# Patient Record
Sex: Female | Born: 1960 | Race: White | Hispanic: No | Marital: Married | Smoking: Former smoker
Health system: Southern US, Community
[De-identification: ages and names within clinical notes are randomized; demographics above are authoritative.]

## PROBLEM LIST (undated history)

## (undated) ENCOUNTER — Ambulatory Visit: Disposition: A | Discharge status: Still In Hospital

## (undated) DIAGNOSIS — F419 Anxiety disorder, unspecified: Secondary | ICD-10-CM

## (undated) DIAGNOSIS — R112 Nausea with vomiting, unspecified: Secondary | ICD-10-CM

## (undated) DIAGNOSIS — Z923 Personal history of irradiation: Secondary | ICD-10-CM

## (undated) DIAGNOSIS — B1920 Unspecified viral hepatitis C without hepatic coma: Secondary | ICD-10-CM

## (undated) DIAGNOSIS — Z8042 Family history of malignant neoplasm of prostate: Secondary | ICD-10-CM

## (undated) DIAGNOSIS — R9389 Abnormal findings on diagnostic imaging of other specified body structures: Secondary | ICD-10-CM

## (undated) DIAGNOSIS — C801 Malignant (primary) neoplasm, unspecified: Secondary | ICD-10-CM

## (undated) DIAGNOSIS — Z800 Family history of malignant neoplasm of digestive organs: Secondary | ICD-10-CM

## (undated) DIAGNOSIS — I2510 Atherosclerotic heart disease of native coronary artery without angina pectoris: Secondary | ICD-10-CM

## (undated) DIAGNOSIS — M542 Cervicalgia: Secondary | ICD-10-CM

## (undated) DIAGNOSIS — M545 Low back pain, unspecified: Secondary | ICD-10-CM

## (undated) DIAGNOSIS — E079 Disorder of thyroid, unspecified: Secondary | ICD-10-CM

## (undated) DIAGNOSIS — G629 Polyneuropathy, unspecified: Secondary | ICD-10-CM

## (undated) DIAGNOSIS — Z9889 Other specified postprocedural states: Secondary | ICD-10-CM

## (undated) DIAGNOSIS — Z853 Personal history of malignant neoplasm of breast: Secondary | ICD-10-CM

## (undated) DIAGNOSIS — L308 Other specified dermatitis: Secondary | ICD-10-CM

## (undated) DIAGNOSIS — E0590 Thyrotoxicosis, unspecified without thyrotoxic crisis or storm: Secondary | ICD-10-CM

## (undated) DIAGNOSIS — K219 Gastro-esophageal reflux disease without esophagitis: Secondary | ICD-10-CM

## (undated) DIAGNOSIS — Z803 Family history of malignant neoplasm of breast: Secondary | ICD-10-CM

## (undated) HISTORY — DX: Disorder of thyroid, unspecified: E07.9

## (undated) HISTORY — DX: Unspecified viral hepatitis C without hepatic coma: B19.20

## (undated) HISTORY — PX: TONSILECTOMY/ADENOIDECTOMY WITH MYRINGOTOMY: SHX6125

## (undated) HISTORY — DX: Family history of malignant neoplasm of breast: Z80.3

## (undated) HISTORY — DX: Gastro-esophageal reflux disease without esophagitis: K21.9

## (undated) HISTORY — DX: Abnormal findings on diagnostic imaging of other specified body structures: R93.89

## (undated) HISTORY — PX: BREAST LUMPECTOMY: SHX2

## (undated) HISTORY — DX: Family history of malignant neoplasm of digestive organs: Z80.0

## (undated) HISTORY — DX: Family history of malignant neoplasm of prostate: Z80.42

---

## 1993-04-19 HISTORY — PX: OTHER SURGICAL HISTORY: SHX169

## 1998-02-11 ENCOUNTER — Ambulatory Visit (HOSPITAL_COMMUNITY): Admission: RE | Admit: 1998-02-11 | Discharge: 1998-02-11 | Payer: Self-pay | Admitting: Gastroenterology

## 1999-04-20 DIAGNOSIS — B1920 Unspecified viral hepatitis C without hepatic coma: Secondary | ICD-10-CM

## 1999-04-20 HISTORY — DX: Unspecified viral hepatitis C without hepatic coma: B19.20

## 2007-04-20 HISTORY — PX: ABDOMINAL HYSTERECTOMY: SHX81

## 2007-09-26 ENCOUNTER — Ambulatory Visit: Payer: Self-pay | Admitting: Gastroenterology

## 2007-12-04 ENCOUNTER — Ambulatory Visit: Payer: Self-pay | Admitting: Unknown Physician Specialty

## 2007-12-14 ENCOUNTER — Ambulatory Visit: Payer: Self-pay | Admitting: Unknown Physician Specialty

## 2008-10-09 ENCOUNTER — Encounter: Payer: Self-pay | Admitting: Internal Medicine

## 2008-10-19 ENCOUNTER — Encounter: Payer: Self-pay | Admitting: Internal Medicine

## 2008-10-29 ENCOUNTER — Encounter: Payer: Self-pay | Admitting: Internal Medicine

## 2008-11-21 ENCOUNTER — Ambulatory Visit: Payer: Self-pay | Admitting: Gastroenterology

## 2009-01-08 ENCOUNTER — Ambulatory Visit: Payer: Self-pay | Admitting: Internal Medicine

## 2009-01-08 DIAGNOSIS — B1710 Acute hepatitis C without hepatic coma: Secondary | ICD-10-CM | POA: Insufficient documentation

## 2009-01-08 DIAGNOSIS — R0602 Shortness of breath: Secondary | ICD-10-CM | POA: Insufficient documentation

## 2009-01-09 ENCOUNTER — Encounter: Payer: Self-pay | Admitting: Internal Medicine

## 2009-01-14 ENCOUNTER — Telehealth: Payer: Self-pay | Admitting: Internal Medicine

## 2009-01-23 ENCOUNTER — Telehealth (INDEPENDENT_AMBULATORY_CARE_PROVIDER_SITE_OTHER): Payer: Self-pay

## 2009-01-25 ENCOUNTER — Ambulatory Visit: Payer: Self-pay | Admitting: Family Medicine

## 2009-01-27 ENCOUNTER — Ambulatory Visit: Payer: Self-pay | Admitting: Internal Medicine

## 2009-01-27 ENCOUNTER — Ambulatory Visit (HOSPITAL_COMMUNITY): Admission: RE | Admit: 2009-01-27 | Discharge: 2009-01-27 | Payer: Self-pay | Admitting: Internal Medicine

## 2009-01-27 ENCOUNTER — Encounter (HOSPITAL_COMMUNITY): Admission: RE | Admit: 2009-01-27 | Discharge: 2009-03-21 | Payer: Self-pay | Admitting: Internal Medicine

## 2009-01-27 ENCOUNTER — Encounter: Payer: Self-pay | Admitting: Internal Medicine

## 2009-01-27 ENCOUNTER — Ambulatory Visit: Payer: Self-pay

## 2009-04-19 HISTORY — PX: COLONOSCOPY: SHX174

## 2009-06-05 ENCOUNTER — Encounter: Admission: RE | Admit: 2009-06-05 | Discharge: 2009-06-05 | Payer: Self-pay | Admitting: General Surgery

## 2009-12-24 ENCOUNTER — Encounter: Admission: RE | Admit: 2009-12-24 | Discharge: 2009-12-24 | Payer: Self-pay | Admitting: Unknown Physician Specialty

## 2009-12-30 ENCOUNTER — Encounter: Admission: RE | Admit: 2009-12-30 | Discharge: 2009-12-30 | Payer: Self-pay | Admitting: Obstetrics and Gynecology

## 2010-01-12 ENCOUNTER — Encounter: Admission: RE | Admit: 2010-01-12 | Discharge: 2010-01-12 | Payer: Self-pay | Admitting: Unknown Physician Specialty

## 2010-01-28 ENCOUNTER — Telehealth (INDEPENDENT_AMBULATORY_CARE_PROVIDER_SITE_OTHER): Payer: Self-pay | Admitting: *Deleted

## 2010-02-03 ENCOUNTER — Ambulatory Visit (HOSPITAL_BASED_OUTPATIENT_CLINIC_OR_DEPARTMENT_OTHER): Admission: RE | Admit: 2010-02-03 | Discharge: 2010-02-03 | Payer: Self-pay | Admitting: General Surgery

## 2010-02-03 ENCOUNTER — Encounter: Admission: RE | Admit: 2010-02-03 | Discharge: 2010-02-03 | Payer: Self-pay | Admitting: General Surgery

## 2010-04-19 HISTORY — PX: HEMORRHOID BANDING: SHX5850

## 2010-05-21 NOTE — Progress Notes (Signed)
Summary: RETURNING CALL  Phone Note Call from Patient Call back at [phone]   Caller: SELF Call For: BENSIMHON Summary of Call: RETURNING A CALL TO MELISSA-PLEASE CALL #[phone] Initial call taken by: Harlon Flor,  January 14, 2009 10:04 AM  Follow-up for Phone Call        results of CXR given. Follow-up by: Charlena Cross, RN, BSN,  January 14, 2009 12:39 PM

## 2010-05-21 NOTE — Assessment & Plan Note (Signed)
Summary: NEW PT; INCREASED SOB WITH EXERTION   Visit Type:  New Patient Primary Provider:  Einar Crow, MD  CC:  sob with exertion.  History of Present Illness: 50 y/o woman with hepatitis C but no h/o HTN, HL, or known CAD. +FHX CAD. Presents as new patient for evaluation of progressive dyspnea.  Had stress echo over 5 years ago for palpitations. Work-up normal. Has never had cath.  Over past few week has noticed that she is more dyspneic on activity. Tried to walk up 3 flights of steps and got very winded. No CP, wheezing or cough.  Continues to play softball but feel exercise tolerance is worse. Able to do ADLs without difficulty.  No longer plane or car trips recently. No orthopnea, PND or edema.   Preventive Screening-Counseling & Management  Alcohol-Tobacco     Alcohol drinks/day: 0     Smoking Status: quit     Year Quit: 2000     Pack years: 1 to 11/2 pack  Caffeine-Diet-Exercise     Caffeine use/day: 3 cups a day     Diet Comments: no     Does Patient Exercise: yes     Type of exercise: swimming      Drug Use:  no.    Problems Prior to Update: 1)  Shortness of Breath  (ICD-786.05) 2)  Hepatitis C  (ICD-070.51)  Current Medications (verified): 1)  Prilosec 20 Mg Cpdr (Omeprazole) .Marland Kitchen.. 1 By Mouth Once Daily  Allergies (verified): 1)  ! * Melaril  Past History:  Past Medical History: G E R D Hepatitis C   --followed br Dr. Esmond Harps at [facility]  Past Surgical History: Lumpectomy  Family History: Reviewed history and no changes required. mother: alive 36; hypertension; thyroid, multi problems father: alive 35; pacmaker,  afib, CAD, CHF 3 brother: healthy  Social History: Reviewed history and no changes required. Full Time Married  Tobacco Use - Former.  Alcohol Use - no Regular Exercise - yes Drug Use - no Alcohol drinks/day:  0 Smoking Status:  quit Pack years:  1 to 11/2 pack Caffeine use/day:  3 cups a day Diet Comments:  no Does  Patient Exercise:  yes Drug Use:  no  Review of Systems       As per HPI and past medical history; otherwise all systems negative.   Vital Signs:  Patient profile:   50 year old female Height:      67 inches Weight:      151.25 pounds BMI:     23.77 Pulse rate:   67 / minute Pulse rhythm:   regular BP sitting:   118 / 82  (left arm) Cuff size:   regular  Vitals Entered By: Mercer Pod (January 08, 2009 3:31 PM)  Physical Exam  General:  Gen: well appearing. no resp difficulty HEENT: normal Neck: supple. no JVD. Carotids 2+ bilat; no bruits. No lymphadenopathy or thryomegaly appreciated. Cor: PMI nondisplaced. Regular rate & rhythm. No rubs, gallops, murmur. Lungs: clear Abdomen: soft, nontender, nondistended. No hepatosplenomegaly. No bruits or masses. Good bowel sounds. Extremities: no cyanosis, clubbing, rash, edema Neuro: alert & orientedx3, cranial nerves grossly intact. moves all 4 extremities w/o difficulty. affect pleasant    Impression & Recommendations:  Problem # 1:  SHORTNESS OF BREATH (ICD-786.05) Unclear etiology. Will check CXR for baseline. Echo to make sure pulmonary pressures and LV function are normal. Check ETT/myoview to rule out ischemia though this is lower on my differential. If testing normal  also need to check CBC to make sure she is not anemic.  Other Orders: T-2 View CXR (71020TC) Nuclear Stress Test (Nuc Stress Test) Echocardiogram (Echo) EKG w/ Interpretation (93000)  Patient Instructions: 1)  A chest x-ray takes a picture of the organs and structures inside the chest, including the heart, lungs, and blood vessels. This test can show several things, including, whether the heart is enlarged; whether fluid is building up in the lungs; and whether pacemaker / defibrillator leads are still in place. 2)  Your physician has requested that you have an echocardiogram.  Echocardiography is a painless test that uses sound waves to create  images of your heart. It provides your doctor with information about the size and shape of your heart and how well your heart's chambers and valves are working.  This procedure takes approximately one hour. There are no restrictions for this procedure. 3)  Your physician has requested that you have an exercise stress myoview.  For further information please visit [email or web address]  Please follow instruction sheet, as given.

## 2010-05-21 NOTE — Assessment & Plan Note (Signed)
Summary: Cardiology Nuclear Study  Nuclear Med Background Indications for Stress Test: Evaluation for Ischemia   History: Echo  History Comments: 5 years ago Stress Echo: NL  Symptoms: DOE, Fatigue, Fatigue with Exertion, SOB    Nuclear Pre-Procedure Cardiac Risk Factors: History of Smoking Caffeine/Decaff Intake: None NPO After: 7:00 PM Lungs: clear IV 0.9% NS with Angio Cath: 22g     IV Site: (R) upper forearm IV Started by: Irean Hong RN Chest Size (in) 36     Cup Size B     Height (in): 67 Weight (lb): 147 BMI: 23.11 Tech Comments: Blunted BP, check pictures  Nuclear Med Study 1 or 2 day study:  1 day     Stress Test Type:  Stress Reading MD:  Dietrich Pates, MD     Referring MD:  D.Bensimhon Resting Radionuclide:  Technetium 72m Tetrofosmin     Resting Radionuclide Dose:  11 mCi  Stress Radionuclide:  Technetium 20m Tetrofosmin     Stress Radionuclide Dose:  32 mCi   Stress Protocol Exercise Time (min):  10:04 min     Max HR:  171 bpm     Predicted Max HR: 173 bpm  Max Systolic BP: 147 mm Hg     % Max HR:  98 %     METS: 11.8 Rate Pressure Product:  45409    Stress Test Technologist:  Milana Na EMT-P     Nuclear Technologist:  Domenic Polite CNMT  Rest Procedure  Myocardial perfusion imaging was performed at rest 45 minutes following the intravenous administration of Myoview Technetium 69m Tetrofosmin.  Stress Procedure  The patient exercised for 10:04. The patient stopped due to fatigue and denied any chest pain.  There were non specific ST-T wave changes.  Myoview was injected at peak exercise and myocardial perfusion imaging was performed after a brief delay.  QPS Raw Data Images:  Normal; no motion artifact; normal heart/lung ratio. Stress Images:  Normal perfusion. Rest Images:  Normal perfusion Subtraction (SDS):  No evidence of ischemia. Transient Ischemic Dilatation:  .95  (Normal <1.22)  Lung/Heart Ratio:  .36  (Normal <0.45)  Quantitative  Gated Spect Images QGS EDV:  71 ml QGS ESV:  18 ml QGS EF:  74 %   Overall Impression  Exercise Capacity: Excellent exercise capacity. BP Response: Normal blood pressure response. Clinical Symptoms: No chest pain ECG Impression: No significant ST segment change suggestive of ischemia. Overall Impression: Normal stress nuclear study.  Appended Document: Cardiology Nuclear Study looks good.  Appended Document: Cardiology Nuclear Study pt aware. Charlena Cross RN BSN

## 2010-05-21 NOTE — Progress Notes (Signed)
Summary: Records Request  Faxed Stress to [facility] at [facility] ([phone]). Debby Freiberg  January 28, 2010 9:23 AM

## 2010-05-21 NOTE — Progress Notes (Signed)
Summary: Nuc.Pre-Procedure  Phone Note Outgoing Call Call back at [facility] Phone [phone]   Call placed by: Irean Hong, RN,  January 23, 2009 11:24 AM Summary of Call: Left message with information on Myoview Information Sheet (see scanned document for details).      Nuclear Med Background Indications for Stress Test: Evaluation for Ischemia   History: Echo  History Comments: 5 years ago Stress Echo: NL  Symptoms: DOE, Fatigue, Fatigue with Exertion    Nuclear Pre-Procedure Cardiac Risk Factors: History of Smoking Height (in): 67

## 2010-05-21 NOTE — Progress Notes (Signed)
Summary: PHI  PHI   Imported By: Harlon Flor 01/10/2009 09:31:23  _____________________________________________________________________  External Attachment:    Type:   Image     Comment:   External Document

## 2010-07-01 LAB — COMPREHENSIVE METABOLIC PANEL
ALT: 40 U/L — ABNORMAL HIGH (ref 0–35)
AST: 29 U/L (ref 0–37)
Albumin: 3.7 g/dL (ref 3.5–5.2)
Alkaline Phosphatase: 47 U/L (ref 39–117)
BUN: 13 mg/dL (ref 6–23)
CO2: 25 mEq/L (ref 19–32)
Calcium: 9.1 mg/dL (ref 8.4–10.5)
Chloride: 111 mEq/L (ref 96–112)
Creatinine, Ser: 0.79 mg/dL (ref 0.4–1.2)
GFR calc Af Amer: >60 mL/min (ref >60)
GFR calc non Af Amer: >60 mL/min (ref >60)
Glucose, Bld: 95 mg/dL (ref 70–99)
Potassium: 4 mEq/L (ref 3.5–5.1)
Sodium: 140 mEq/L (ref 135–145)
Total Bilirubin: 0.6 mg/dL (ref 0.3–1.2)
Total Protein: 7 g/dL (ref 6.0–8.3)

## 2010-07-01 LAB — DIFFERENTIAL
Basophils Absolute: 0 10*3/uL (ref 0.0–0.1)
Basophils Relative: 0 % (ref 0–1)
Eosinophils Absolute: 0.1 10*3/uL (ref 0.0–0.7)
Eosinophils Relative: 1 % (ref 0–5)
Lymphocytes Relative: 30 % (ref 12–46)
Lymphs Abs: 1.6 10*3/uL (ref 0.7–4.0)
Monocytes Absolute: 0.3 10*3/uL (ref 0.1–1.0)
Monocytes Relative: 6 % (ref 3–12)
Neutro Abs: 3.4 10*3/uL (ref 1.7–7.7)
Neutrophils Relative %: 62 % (ref 43–77)

## 2010-07-01 LAB — URINALYSIS, ROUTINE W REFLEX MICROSCOPIC
Bilirubin Urine: NEGATIVE
Glucose, UA: NEGATIVE mg/dL
Hgb urine dipstick: NEGATIVE
Ketones, ur: NEGATIVE mg/dL
Nitrite: NEGATIVE
Protein, ur: NEGATIVE mg/dL
Specific Gravity, Urine: 1.016 (ref 1.005–1.030)
Urobilinogen, UA: 1 mg/dL (ref 0.0–1.0)
pH: 7 (ref 5.0–8.0)

## 2010-07-01 LAB — CBC
HCT: 41.6 % (ref 36.0–46.0)
Hemoglobin: 14.7 g/dL (ref 12.0–15.0)
MCH: 32.6 pg (ref 26.0–34.0)
MCHC: 35.3 g/dL (ref 30.0–36.0)
MCV: 92.2 fL (ref 78.0–100.0)
Platelets: 188 10*3/uL (ref 150–400)
RBC: 4.51 MIL/uL (ref 3.87–5.11)
RDW: 12.6 % (ref 11.5–15.5)
WBC: 5.4 10*3/uL (ref 4.0–10.5)

## 2010-07-01 LAB — URINE MICROSCOPIC-ADD ON

## 2010-08-01 ENCOUNTER — Ambulatory Visit: Payer: Self-pay | Admitting: Internal Medicine

## 2010-10-30 ENCOUNTER — Encounter: Payer: Self-pay | Admitting: Internal Medicine

## 2010-12-09 ENCOUNTER — Other Ambulatory Visit: Payer: Self-pay | Admitting: Unknown Physician Specialty

## 2010-12-09 DIAGNOSIS — Z1231 Encounter for screening mammogram for malignant neoplasm of breast: Secondary | ICD-10-CM

## 2011-01-12 ENCOUNTER — Ambulatory Visit
Admission: RE | Admit: 2011-01-12 | Discharge: 2011-01-12 | Disposition: A | Discharge status: Home / Self Care | Source: Ambulatory Visit | Attending: Unknown Physician Specialty | Admitting: Unknown Physician Specialty

## 2011-01-12 DIAGNOSIS — Z1231 Encounter for screening mammogram for malignant neoplasm of breast: Secondary | ICD-10-CM

## 2011-01-19 ENCOUNTER — Other Ambulatory Visit: Payer: Self-pay | Admitting: Unknown Physician Specialty

## 2011-01-19 DIAGNOSIS — R928 Other abnormal and inconclusive findings on diagnostic imaging of breast: Secondary | ICD-10-CM

## 2011-01-28 ENCOUNTER — Ambulatory Visit
Admission: RE | Admit: 2011-01-28 | Discharge: 2011-01-28 | Disposition: A | Discharge status: Home / Self Care | Source: Ambulatory Visit | Attending: Unknown Physician Specialty | Admitting: Unknown Physician Specialty

## 2011-01-28 DIAGNOSIS — R928 Other abnormal and inconclusive findings on diagnostic imaging of breast: Secondary | ICD-10-CM

## 2011-04-20 DIAGNOSIS — R9389 Abnormal findings on diagnostic imaging of other specified body structures: Secondary | ICD-10-CM

## 2011-04-20 HISTORY — DX: Abnormal findings on diagnostic imaging of other specified body structures: R93.89

## 2011-04-20 HISTORY — PX: UPPER GI ENDOSCOPY: SHX6162

## 2011-04-20 HISTORY — PX: SIGMOIDOSCOPY: SUR1295

## 2011-08-17 ENCOUNTER — Emergency Department (HOSPITAL_COMMUNITY)

## 2011-08-17 ENCOUNTER — Emergency Department (HOSPITAL_COMMUNITY)
Admission: EM | Admit: 2011-08-17 | Discharge: 2011-08-17 | Disposition: A | Discharge status: Home / Self Care | Attending: Emergency Medicine | Admitting: Emergency Medicine

## 2011-08-17 ENCOUNTER — Encounter (HOSPITAL_COMMUNITY): Payer: Self-pay | Admitting: *Deleted

## 2011-08-17 DIAGNOSIS — R110 Nausea: Secondary | ICD-10-CM | POA: Insufficient documentation

## 2011-08-17 DIAGNOSIS — R197 Diarrhea, unspecified: Secondary | ICD-10-CM | POA: Insufficient documentation

## 2011-08-17 DIAGNOSIS — K529 Noninfective gastroenteritis and colitis, unspecified: Secondary | ICD-10-CM

## 2011-08-17 DIAGNOSIS — R10819 Abdominal tenderness, unspecified site: Secondary | ICD-10-CM | POA: Insufficient documentation

## 2011-08-17 DIAGNOSIS — K5289 Other specified noninfective gastroenteritis and colitis: Secondary | ICD-10-CM | POA: Insufficient documentation

## 2011-08-17 DIAGNOSIS — K219 Gastro-esophageal reflux disease without esophagitis: Secondary | ICD-10-CM | POA: Insufficient documentation

## 2011-08-17 DIAGNOSIS — R1013 Epigastric pain: Secondary | ICD-10-CM | POA: Insufficient documentation

## 2011-08-17 DIAGNOSIS — R5381 Other malaise: Secondary | ICD-10-CM | POA: Insufficient documentation

## 2011-08-17 DIAGNOSIS — Z8619 Personal history of other infectious and parasitic diseases: Secondary | ICD-10-CM | POA: Insufficient documentation

## 2011-08-17 DIAGNOSIS — Z79899 Other long term (current) drug therapy: Secondary | ICD-10-CM | POA: Insufficient documentation

## 2011-08-17 LAB — URINALYSIS, ROUTINE W REFLEX MICROSCOPIC
Bilirubin Urine: NEGATIVE
Glucose, UA: NEGATIVE mg/dL
Hgb urine dipstick: NEGATIVE
Ketones, ur: NEGATIVE mg/dL
Nitrite: NEGATIVE
Protein, ur: NEGATIVE mg/dL
Specific Gravity, Urine: 1.027 (ref 1.005–1.030)
Urobilinogen, UA: 0.2 mg/dL (ref 0.0–1.0)
pH: 5 (ref 5.0–8.0)

## 2011-08-17 LAB — CBC
HCT: 45.5 % (ref 36.0–46.0)
Hemoglobin: 16.4 g/dL — ABNORMAL HIGH (ref 12.0–15.0)
MCH: 32.5 pg (ref 26.0–34.0)
MCHC: 36 g/dL (ref 30.0–36.0)
MCV: 90.1 fL (ref 78.0–100.0)
Platelets: 139 10*3/uL — ABNORMAL LOW (ref 150–400)
RBC: 5.05 MIL/uL (ref 3.87–5.11)
RDW: 12.2 % (ref 11.5–15.5)
WBC: 9.1 10*3/uL (ref 4.0–10.5)

## 2011-08-17 LAB — COMPREHENSIVE METABOLIC PANEL
ALT: 82 U/L — ABNORMAL HIGH (ref 0–35)
AST: 50 U/L — ABNORMAL HIGH (ref 0–37)
Albumin: 4.2 g/dL (ref 3.5–5.2)
Alkaline Phosphatase: 52 U/L (ref 39–117)
BUN: 13 mg/dL (ref 6–23)
CO2: 25 mEq/L (ref 19–32)
Calcium: 9.7 mg/dL (ref 8.4–10.5)
Chloride: 104 mEq/L (ref 96–112)
Creatinine, Ser: 0.68 mg/dL (ref 0.50–1.10)
GFR calc Af Amer: >90 mL/min (ref >90)
GFR calc non Af Amer: >90 mL/min (ref >90)
Glucose, Bld: 129 mg/dL — ABNORMAL HIGH (ref 70–99)
Potassium: 3.5 mEq/L (ref 3.5–5.1)
Sodium: 140 mEq/L (ref 135–145)
Total Bilirubin: 0.7 mg/dL (ref 0.3–1.2)
Total Protein: 7.9 g/dL (ref 6.0–8.3)

## 2011-08-17 LAB — URINE MICROSCOPIC-ADD ON

## 2011-08-17 LAB — DIFFERENTIAL
Basophils Absolute: 0 10*3/uL (ref 0.0–0.1)
Basophils Relative: 0 % (ref 0–1)
Eosinophils Absolute: 0 10*3/uL (ref 0.0–0.7)
Eosinophils Relative: 0 % (ref 0–5)
Lymphocytes Relative: 12 % (ref 12–46)
Lymphs Abs: 1.1 10*3/uL (ref 0.7–4.0)
Monocytes Absolute: 0.3 10*3/uL (ref 0.1–1.0)
Monocytes Relative: 3 % (ref 3–12)
Neutro Abs: 7.7 10*3/uL (ref 1.7–7.7)
Neutrophils Relative %: 85 % — ABNORMAL HIGH (ref 43–77)

## 2011-08-17 LAB — LACTIC ACID, PLASMA: Lactic Acid, Venous: 1.5 mmol/L (ref 0.5–2.2)

## 2011-08-17 LAB — AMMONIA: Ammonia: 18 umol/L (ref 11–60)

## 2011-08-17 LAB — LIPASE, BLOOD: Lipase: 34 U/L (ref 11–59)

## 2011-08-17 MED ORDER — HYDROCODONE-ACETAMINOPHEN 5-500 MG PO TABS: 1.0000 | ORAL_TABLET | Freq: Four times a day (QID) | ORAL | Status: AC | PRN

## 2011-08-17 MED ORDER — GI COCKTAIL ~~LOC~~
30.0000 mL | Freq: Once | ORAL | Status: AC
  Administered 2011-08-17: 30 mL via ORAL
  Filled 2011-08-17: qty 30

## 2011-08-17 MED ORDER — PANTOPRAZOLE SODIUM 40 MG IV SOLR
40.0000 mg | Freq: Once | INTRAVENOUS | Status: AC
  Administered 2011-08-17: 40 mg via INTRAVENOUS
  Filled 2011-08-17: qty 40

## 2011-08-17 MED ORDER — IOHEXOL 300 MG/ML  SOLN
80.0000 mL | Freq: Once | INTRAMUSCULAR | Status: AC | PRN
  Administered 2011-08-17: 80 mL via INTRAVENOUS

## 2011-08-17 MED ORDER — SODIUM CHLORIDE 0.9 % IV BOLUS (SEPSIS)
1000.0000 mL | Freq: Once | INTRAVENOUS | Status: AC
  Administered 2011-08-17: 1000 mL via INTRAVENOUS

## 2011-08-17 MED ORDER — ONDANSETRON HCL 4 MG/2ML IJ SOLN
4.0000 mg | Freq: Once | INTRAMUSCULAR | Status: AC
  Administered 2011-08-17: 4 mg via INTRAVENOUS
  Filled 2011-08-17: qty 2

## 2011-08-17 MED ORDER — IOHEXOL 300 MG/ML  SOLN
20.0000 mL | INTRAMUSCULAR | Status: AC
  Administered 2011-08-17 (×2): 20 mL via ORAL

## 2011-08-17 MED ORDER — DIPHENOXYLATE-ATROPINE 2.5-0.025 MG PO TABS: 1.0000 | ORAL_TABLET | Freq: Four times a day (QID) | ORAL | Status: AC | PRN

## 2011-08-17 NOTE — ED Notes (Signed)
Pt. oob to the bathroom gait steady 

## 2011-08-17 NOTE — ED Notes (Signed)
Pt woke up yesterday with cramping across stomach.  Pt is here with abd pain to mid upper abdomen and was having loose stools all day  Yesterday.  No vomiting.  Pt has discomfort with urination

## 2011-08-17 NOTE — ED Notes (Signed)
Pt reports pain in her epigastric region and abdominal cramping across her entire abdomen.  Reports loose stools and painful urination.  Pt is tender on palpation of her lower abdomen, no increased tenderness on (R) side.  Pt denies vomiting.  Skin warm, dry and intact.  Neuro intact.  Family at bedside, pt ambulatory.

## 2011-08-17 NOTE — Discharge Instructions (Signed)
Stay well-hydrated. Use Lomotil as directed for diarrhea. Use hydrocodone-acetaminophen as needed for pain. Do not drive or operate machinery with hydrocodone-acetaminophen use. It is very important followup with primary care provider in one week for recheck of ongoing symptoms but to return to emergency department for emergent changing or worsening symptoms. Avoid any acidic food. Follow a bland diet for the next 24-48 hours.  Viral Gastroenteritis Viral gastroenteritis is also known as stomach flu. This condition affects the stomach and intestinal tract. It can cause sudden diarrhea and vomiting. The illness typically lasts 3 to 8 days. Most people develop an immune response that eventually gets rid of the virus. While this natural response develops, the virus can make you quite ill. CAUSES  Many different viruses can cause gastroenteritis, such as rotavirus or noroviruses. You can catch one of these viruses by consuming contaminated food or water. You may also catch a virus by sharing utensils or other personal items with an infected person or by touching a contaminated surface. SYMPTOMS  The most common symptoms are diarrhea and vomiting. These problems can cause a severe loss of body fluids (dehydration) and a body salt (electrolyte) imbalance. Other symptoms may include:  Fever.   Headache.   Fatigue.   Abdominal pain.  DIAGNOSIS  Your caregiver can usually diagnose viral gastroenteritis based on your symptoms and a physical exam. A stool sample may also be taken to test for the presence of viruses or other infections. TREATMENT  This illness typically goes away on its own. Treatments are aimed at rehydration. The most serious cases of viral gastroenteritis involve vomiting so severely that you are not able to keep fluids down. In these cases, fluids must be given through an intravenous line (IV). HOME CARE INSTRUCTIONS   Drink enough fluids to keep your urine clear or pale yellow. Drink  small amounts of fluids frequently and increase the amounts as tolerated.   Ask your caregiver for specific rehydration instructions.   Avoid:   Foods high in sugar.   Alcohol.   Carbonated drinks.   Tobacco.   Juice.   Caffeine drinks.   Extremely hot or cold fluids.   Fatty, greasy foods.   Too much intake of anything at one time.   Dairy products until 24 to 48 hours after diarrhea stops.   You may consume probiotics. Probiotics are active cultures of beneficial bacteria. They may lessen the amount and number of diarrheal stools in adults. Probiotics can be found in yogurt with active cultures and in supplements.   Wash your hands well to avoid spreading the virus.   Only take over-the-counter or prescription medicines for pain, discomfort, or fever as directed by your caregiver. Do not give aspirin to children. Antidiarrheal medicines are not recommended.   Ask your caregiver if you should continue to take your regular prescribed and over-the-counter medicines.   Keep all follow-up appointments as directed by your caregiver.  SEEK IMMEDIATE MEDICAL CARE IF:   You are unable to keep fluids down.   You do not urinate at least once every 6 to 8 hours.   You develop shortness of breath.   You notice blood in your stool or vomit. This may look like coffee grounds.   You have abdominal pain that increases or is concentrated in one small area (localized).   You have persistent vomiting or diarrhea.   You have a fever.   The patient is a child younger than 3 months, and he or she has a  fever.   The patient is a child older than 3 months, and he or she has a fever and persistent symptoms.   The patient is a child older than 3 months, and he or she has a fever and symptoms suddenly get worse.   The patient is a baby, and he or she has no tears when crying.  MAKE SURE YOU:   Understand these instructions.   Will watch your condition.   Will get help right away  if you are not doing well or get worse.  Document Released: 04/05/2005 Document Revised: 03/25/2011 Document Reviewed: 01/20/2011 [facility] Patient Information 2012 [place], [place].

## 2011-08-17 NOTE — ED Provider Notes (Signed)
Patient is sent to the CDU by Dr. Manus Gunning for pending CT abdomen pelvis to evaluate abdominal pain and ongoing recurrent diarrhea. Patient afebrile nontoxic-appearing. She has a nonacute abdomen. If CT abdomen and pelvis is negative for acute findings then patient will be discharged home with pain medication and primary care followup per Dr. Manus Gunning.  Jenness Corner, [place] 08/17/11 1120

## 2011-08-17 NOTE — ED Provider Notes (Signed)
Medical screening examination/treatment/procedure(s) were conducted as a shared visit with non-physician practitioner(s) and myself.  I personally evaluated the patient during the encounter   Courtney Octave, MD 08/17/11 1421

## 2011-08-17 NOTE — ED Provider Notes (Signed)
History     CSN: [id number]  Arrival date & time 08/17/11  0820   First MD Initiated Contact with Patient 08/17/11 [phone]      Chief Complaint  Patient presents with  . Abdominal Pain    (Consider location/radiation/quality/duration/timing/severity/associated sxs/prior treatment) HPI Comments: Patient presents with abdominal pain and diarrhea that started yesterday. She's had frequent loose stools about 9 times yesterday with no blood. His burning abdominal pain in her epigastrium that radiates across her entire abdomen. She denies any vomiting but has felt nauseated. She's had normal by mouth intake and urine output. Patient's husband adds that she's had problems with diarrhea for a year or more he comes to be intermittent. She's not had a formal evaluation for this. She denies any hematuria but has had some dysuria. She is a history of hepatitis C but is on no medications for it. Denies any fevers, chest pain, shortness of breath, vaginal bleeding or discharge.  Patient is a 51 y.o. female presenting with abdominal pain. The history is provided by the spouse and the patient.  Abdominal Pain The primary symptoms of the illness include abdominal pain, nausea and diarrhea. The primary symptoms of the illness do not include fever, shortness of breath, vomiting, dysuria, vaginal discharge or vaginal bleeding.  Symptoms associated with the illness do not include back pain.    Past Medical History  Diagnosis Date  . GERD (gastroesophageal reflux disease)   . Hepatitis C     Followed by Dr. Esmond Harps at [facility]    Past Surgical History  Procedure Date  . Breast lumpectomy     Family History  Problem Relation Age of Onset  . Hypertension Mother   . Thyroid disease Mother     Multi problems  . Coronary artery disease Father   . Heart failure Father   . Atrial fibrillation Father     History  Substance Use Topics  . Smoking status: Former Games developer  . Smokeless tobacco: Not on file    . Alcohol Use: No    OB History    Grav Para Term Preterm Abortions TAB SAB Ect Mult Living                  Review of Systems  Constitutional: Positive for activity change. Negative for fever and appetite change.  HENT: Negative for congestion and rhinorrhea.   Respiratory: Negative for cough, chest tightness and shortness of breath.   Cardiovascular: Negative for chest pain.  Gastrointestinal: Positive for nausea, abdominal pain and diarrhea. Negative for vomiting.  Genitourinary: Negative for dysuria, vaginal bleeding and vaginal discharge.  Musculoskeletal: Negative for back pain.  Skin: Negative for rash.  Neurological: Positive for weakness. Negative for headaches.    Allergies  Thioridazine hcl  Home Medications   Current Outpatient Rx  Name Route Sig Dispense Refill  . VITAMIN D 1000 UNITS PO TABS Oral Take 1,000 Units by mouth every morning.    Marland Kitchen ESTRADIOL 0.5 MG PO TABS Oral Take 0.5 mg by mouth every evening.    Marland Kitchen DIPHENOXYLATE-ATROPINE 2.5-0.025 MG PO TABS Oral Take 1 tablet by mouth 4 (four) times daily as needed for diarrhea or loose stools. 30 tablet 0  . HYDROCODONE-ACETAMINOPHEN 5-500 MG PO TABS Oral Take 1-2 tablets by mouth every 6 (six) hours as needed for pain. 15 tablet 0    BP 114/84  Pulse 74  Temp(Src) 97.9 F (36.6 C) (Oral)  Resp 18  SpO2 98%  Physical Exam  Constitutional: She  is oriented to person, place, and time. She appears well-developed and well-nourished. No distress.  HENT:  Head: Normocephalic and atraumatic.  Mouth/Throat: Oropharynx is clear and moist. No oropharyngeal exudate.  Eyes: Conjunctivae are normal. Pupils are equal, round, and reactive to light.  Neck: Normal range of motion. Neck supple.  Cardiovascular: Normal rate, regular rhythm and normal heart sounds.   Pulmonary/Chest: Effort normal and breath sounds normal. No respiratory distress.  Abdominal: Soft. There is tenderness.       Mild diffuse lower abdominal  tenderness no guarding or rebound  Musculoskeletal: Normal range of motion. She exhibits no edema and no tenderness.  Neurological: She is alert and oriented to person, place, and time. No cranial nerve deficit.  Skin: Skin is warm.    ED Course  Procedures (including critical care time)  Labs Reviewed  URINALYSIS, ROUTINE W REFLEX MICROSCOPIC - Abnormal; Notable for the following:    Leukocytes, UA SMALL (*)    All other components within normal limits  CBC - Abnormal; Notable for the following:    Hemoglobin 16.4 (*)    Platelets 139 (*)    All other components within normal limits  DIFFERENTIAL - Abnormal; Notable for the following:    Neutrophils Relative 85 (*)    All other components within normal limits  COMPREHENSIVE METABOLIC PANEL - Abnormal; Notable for the following:    Glucose, Bld 129 (*)    AST 50 (*)    ALT 82 (*)    All other components within normal limits  URINE MICROSCOPIC-ADD ON - Abnormal; Notable for the following:    Squamous Epithelial / LPF FEW (*)    All other components within normal limits  LIPASE, BLOOD  LACTIC ACID, PLASMA  AMMONIA  LAB REPORT - SCANNED   Ct Abdomen Pelvis W Contrast  08/17/2011  *RADIOLOGY REPORT*  Clinical Data: Epigastric abdominal pain and diarrhea.  CT ABDOMEN AND PELVIS WITH CONTRAST  Technique:  Multidetector CT imaging of the abdomen and pelvis was performed following the standard protocol during bolus administration of intravenous contrast.  Contrast: 80mL OMNIPAQUE IOHEXOL 300 MG/ML  SOLN  Comparison: None  Findings: The lung bases are clear.  No pleural effusion or pulmonary nodule.  The liver is unremarkable.  No focal lesions or biliary dilatation. The gallbladder is normal.  No common bile duct dilatation.  The pancreas is normal.  The spleen is normal in size.  No focal lesions.  There is a small left adrenal gland nodule.  This is likely a benign adenoma.  The right adrenal gland is normal.  Both kidneys are normal.   The stomach is not well distended but no gross abnormalities are seen.  Suspect mildly inflamed proximal small bowel loops with mild mucosal fold thickening but no mass or obstruction.  The colon is unremarkable.  No findings for diverticulitis or mass.  There are scattered mesenteric and retroperitoneal lymph nodes but no mass or adenopathy.  Mild distal aortic calcifications are noted.  No aneurysm.  The uterus is surgically absent.  Both ovaries are still present and appear normal.  There is a small amount of slightly complex free pelvic fluid.  This could be related to enteritis or a ruptured ovarian cyst.  The appendix is normal.  The bladder is normal.  No pelvic mass or lymphadenopathy.  No inguinal mass or hernia.  No significant bony findings.  IMPRESSION:  1.  CT findings suggestive of gastroenteritis.  No findings for colitis.  No obstruction. 2.  Small left adrenal gland nodule, likely benign adenoma.  Original Report Authenticated By: P. Loralie Champagne, M.D.     1. Gastroenteritis       MDM  Diffuse abdominal pain and diarrhea. Abdomen soft and nonsurgical.  Nontoxic, well hydrated. Suspect gastroenteritis.   Labs, IVF, zofran, PO challenge.      Date: 08/17/2011  Rate: 73  Rhythm: normal sinus rhythm  QRS Axis: normal  Intervals: normal  ST/T Wave abnormalities: nonspecific ST/T changes  Conduction Disutrbances:none  Narrative Interpretation: Q waves V2 V3, no comparison  Old EKG Reviewed: none available    Glynn Octave, MD 08/18/11 [phone]

## 2011-12-23 ENCOUNTER — Emergency Department (HOSPITAL_COMMUNITY)
Admission: EM | Admit: 2011-12-23 | Discharge: 2011-12-24 | Disposition: A | Discharge status: Home / Self Care | Attending: Emergency Medicine | Admitting: Emergency Medicine

## 2011-12-23 ENCOUNTER — Encounter (HOSPITAL_COMMUNITY): Payer: Self-pay

## 2011-12-23 ENCOUNTER — Emergency Department (HOSPITAL_COMMUNITY)

## 2011-12-23 DIAGNOSIS — R0789 Other chest pain: Secondary | ICD-10-CM | POA: Insufficient documentation

## 2011-12-23 DIAGNOSIS — B1920 Unspecified viral hepatitis C without hepatic coma: Secondary | ICD-10-CM | POA: Insufficient documentation

## 2011-12-23 DIAGNOSIS — K219 Gastro-esophageal reflux disease without esophagitis: Secondary | ICD-10-CM | POA: Insufficient documentation

## 2011-12-23 DIAGNOSIS — R110 Nausea: Secondary | ICD-10-CM

## 2011-12-23 DIAGNOSIS — Z79899 Other long term (current) drug therapy: Secondary | ICD-10-CM | POA: Insufficient documentation

## 2011-12-23 DIAGNOSIS — R109 Unspecified abdominal pain: Secondary | ICD-10-CM | POA: Insufficient documentation

## 2011-12-23 LAB — CBC WITH DIFFERENTIAL/PLATELET
Basophils Absolute: 0 10*3/uL (ref 0.0–0.1)
Basophils Relative: 0 % (ref 0–1)
Eosinophils Absolute: 0.1 10*3/uL (ref 0.0–0.7)
Eosinophils Relative: 1 % (ref 0–5)
HCT: 38.6 % (ref 36.0–46.0)
Hemoglobin: 13.2 g/dL (ref 12.0–15.0)
Lymphocytes Relative: 13 % (ref 12–46)
Lymphs Abs: 0.9 10*3/uL (ref 0.7–4.0)
MCH: 31.1 pg (ref 26.0–34.0)
MCHC: 34.2 g/dL (ref 30.0–36.0)
MCV: 91 fL (ref 78.0–100.0)
Monocytes Absolute: 0.5 10*3/uL (ref 0.1–1.0)
Monocytes Relative: 7 % (ref 3–12)
Neutro Abs: 5.2 10*3/uL (ref 1.7–7.7)
Neutrophils Relative %: 79 % — ABNORMAL HIGH (ref 43–77)
Platelets: 168 10*3/uL (ref 150–400)
RBC: 4.24 MIL/uL (ref 3.87–5.11)
RDW: 12.6 % (ref 11.5–15.5)
WBC: 6.5 10*3/uL (ref 4.0–10.5)

## 2011-12-23 LAB — POCT I-STAT TROPONIN I: Troponin i, poc: 0 ng/mL (ref 0.00–0.08)

## 2011-12-23 LAB — COMPREHENSIVE METABOLIC PANEL
ALT: 56 U/L — ABNORMAL HIGH (ref 0–35)
AST: 35 U/L (ref 0–37)
Albumin: 3.9 g/dL (ref 3.5–5.2)
Alkaline Phosphatase: 58 U/L (ref 39–117)
BUN: 15 mg/dL (ref 6–23)
CO2: 25 mEq/L (ref 19–32)
Calcium: 9.8 mg/dL (ref 8.4–10.5)
Chloride: 106 mEq/L (ref 96–112)
Creatinine, Ser: 0.74 mg/dL (ref 0.50–1.10)
GFR calc Af Amer: >90 mL/min (ref >90)
GFR calc non Af Amer: >90 mL/min (ref >90)
Glucose, Bld: 115 mg/dL — ABNORMAL HIGH (ref 70–99)
Potassium: 3.4 mEq/L — ABNORMAL LOW (ref 3.5–5.1)
Sodium: 142 mEq/L (ref 135–145)
Total Bilirubin: 0.7 mg/dL (ref 0.3–1.2)
Total Protein: 7.4 g/dL (ref 6.0–8.3)

## 2011-12-23 LAB — LIPASE, BLOOD: Lipase: 39 U/L (ref 11–59)

## 2011-12-23 MED ORDER — ONDANSETRON 4 MG PO TBDP
8.0000 mg | ORAL_TABLET | Freq: Once | ORAL | Status: AC
  Administered 2011-12-23: 8 mg via ORAL
  Filled 2011-12-23: qty 2

## 2011-12-23 MED ORDER — PANTOPRAZOLE SODIUM 40 MG IV SOLR
40.0000 mg | Freq: Once | INTRAVENOUS | Status: AC
  Administered 2011-12-23: 40 mg via INTRAVENOUS
  Filled 2011-12-23: qty 40

## 2011-12-23 MED ORDER — GI COCKTAIL ~~LOC~~
30.0000 mL | Freq: Once | ORAL | Status: AC
  Administered 2011-12-23: 30 mL via ORAL
  Filled 2011-12-23: qty 30

## 2011-12-23 MED ORDER — SODIUM CHLORIDE 0.9 % IV SOLN
INTRAVENOUS | Status: DC
  Administered 2011-12-23: via INTRAVENOUS

## 2011-12-23 MED ORDER — FAMOTIDINE 20 MG PO TABS
20.0000 mg | ORAL_TABLET | Freq: Once | ORAL | Status: AC
  Administered 2011-12-23: 20 mg via ORAL
  Filled 2011-12-23: qty 1

## 2011-12-23 MED ORDER — ONDANSETRON HCL 4 MG/2ML IJ SOLN
4.0000 mg | Freq: Once | INTRAMUSCULAR | Status: AC
  Administered 2011-12-23: 4 mg via INTRAVENOUS
  Filled 2011-12-23: qty 2

## 2011-12-23 NOTE — ED Notes (Signed)
MD at bedside. 

## 2011-12-23 NOTE — ED Notes (Signed)
Pt reports feeling nauseous x2 days, chest tightness x1 days, and intermittent epigastric pain. Pt reports her symptoms started after having her hemorrhoids banded 2 months ago, pt reports her pcp has evaluated her hemorrhoids w/no new findings.

## 2011-12-23 NOTE — ED Provider Notes (Signed)
History     CSN: [id number]  Arrival date & time 12/23/11  2049   First MD Initiated Contact with Patient 12/23/11 2312      Chief Complaint  Patient presents with  . Nausea    (Consider location/radiation/quality/duration/timing/severity/associated sxs/prior treatment) HPI Hx per PT, on and off nausea with chest tightness, onset yesterday morning, saw her doctor yesterday for this and was started on prilosec but she did not take it today due to anxiety about starting a new medication.  Some epigastric discomfort and diagnosed with gerd in the past with these same symptoms. She does feel anxious, non smoker, works out Research scientist (life sciences), denies NSAIDs, had stress test about 2 years ago - told it was normal.  Symptoms constant and persistent. Mod in severity. Past Medical History  Diagnosis Date  . GERD (gastroesophageal reflux disease)   . Hepatitis C     Followed by Dr. Esmond Harps at [facility]    Past Surgical History  Procedure Date  . Breast lumpectomy     Family History  Problem Relation Age of Onset  . Hypertension Mother   . Thyroid disease Mother     Multi problems  . Coronary artery disease Father   . Heart failure Father   . Atrial fibrillation Father     History  Substance Use Topics  . Smoking status: Former Games developer  . Smokeless tobacco: Not on file  . Alcohol Use: No    OB History    Grav Para Term Preterm Abortions TAB SAB Ect Mult Living                  Review of Systems  Constitutional: Negative for fever and chills.  HENT: Negative for neck pain and neck stiffness.   Eyes: Negative for pain.  Respiratory: Positive for chest tightness. Negative for cough and shortness of breath.   Cardiovascular: Negative for chest pain.  Gastrointestinal: Positive for abdominal pain.  Genitourinary: Negative for dysuria.  Musculoskeletal: Negative for back pain.  Skin: Negative for rash.  Neurological: Negative for headaches.  All other systems reviewed and are  negative.    Allergies  Thioridazine hcl  Home Medications   Current Outpatient Rx  Name Route Sig Dispense Refill  . VITAMIN D 1000 UNITS PO TABS Oral Take 2,000 Units by mouth every morning.     Marland Kitchen ESTRADIOL 0.5 MG PO TABS Oral Take 1 mg by mouth every evening.     Marland Kitchen OMEPRAZOLE 40 MG PO CPDR Oral Take 40 mg by mouth daily.      BP 111/78  Pulse 69  Temp 98 F (36.7 C) (Oral)  Resp 18  SpO2 99%  Physical Exam  Constitutional: She is oriented to person, place, and time. She appears well-developed and well-nourished.  HENT:  Head: Normocephalic and atraumatic.  Eyes: Conjunctivae and EOM are normal. Pupils are equal, round, and reactive to light.  Neck: Trachea normal. Neck supple. No thyromegaly present.  Cardiovascular: Normal rate, regular rhythm, S1 normal, S2 normal and normal pulses.     No systolic murmur is present   No diastolic murmur is present  Pulses:      Radial pulses are 2+ on the right side, and 2+ on the left side.  Pulmonary/Chest: Effort normal and breath sounds normal. She has no wheezes. She has no rhonchi. She has no rales. She exhibits no tenderness.  Abdominal: Soft. Normal appearance and bowel sounds are normal. There is no CVA tenderness and negative Murphy's sign.  Mild epigastric tenderness  Musculoskeletal:       BLE:s Calves nontender, no cords or erythema, negative Homans sign  Neurological: She is alert and oriented to person, place, and time. She has normal strength. No cranial nerve deficit or sensory deficit. GCS eye subscore is 4. GCS verbal subscore is 5. GCS motor subscore is 6.  Skin: Skin is warm and dry. No rash noted. She is not diaphoretic.  Psychiatric: Her speech is normal.       Cooperative and appropriate    ED Course  Procedures (including critical care time)   Date: 12/23/2011  Rate: 76  Rhythm: normal sinus rhythm  QRS Axis: normal  Intervals: normal  ST/T Wave abnormalities: nonspecific ST changes   Conduction Disutrbances:none  Narrative Interpretation:   Old EKG Reviewed: unchanged  Results for orders placed during the hospital encounter of 12/23/11  CBC WITH DIFFERENTIAL      Component Value Range   WBC 6.5  4.0 - 10.5 K/uL   RBC 4.24  3.87 - 5.11 MIL/uL   Hemoglobin 13.2  12.0 - 15.0 g/dL   HCT 96.0  45.4 - 09.8 %   MCV 91.0  78.0 - 100.0 fL   MCH 31.1  26.0 - 34.0 pg   MCHC 34.2  30.0 - 36.0 g/dL   RDW 11.9  14.7 - 82.9 %   Platelets 168  150 - 400 K/uL   Neutrophils Relative 79 (*) 43 - 77 %   Neutro Abs 5.2  1.7 - 7.7 K/uL   Lymphocytes Relative 13  12 - 46 %   Lymphs Abs 0.9  0.7 - 4.0 K/uL   Monocytes Relative 7  3 - 12 %   Monocytes Absolute 0.5  0.1 - 1.0 K/uL   Eosinophils Relative 1  0 - 5 %   Eosinophils Absolute 0.1  0.0 - 0.7 K/uL   Basophils Relative 0  0 - 1 %   Basophils Absolute 0.0  0.0 - 0.1 K/uL  COMPREHENSIVE METABOLIC PANEL      Component Value Range   Sodium 142  135 - 145 mEq/L   Potassium 3.4 (*) 3.5 - 5.1 mEq/L   Chloride 106  96 - 112 mEq/L   CO2 25  19 - 32 mEq/L   Glucose, Bld 115 (*) 70 - 99 mg/dL   BUN 15  6 - 23 mg/dL   Creatinine, Ser 5.62  0.50 - 1.10 mg/dL   Calcium 9.8  8.4 - 13.0 mg/dL   Total Protein 7.4  6.0 - 8.3 g/dL   Albumin 3.9  3.5 - 5.2 g/dL   AST 35  0 - 37 U/L   ALT 56 (*) 0 - 35 U/L   Alkaline Phosphatase 58  39 - 117 U/L   Total Bilirubin 0.7  0.3 - 1.2 mg/dL   GFR calc non Af Amer >90  >90 mL/min   GFR calc Af Amer >90  >90 mL/min  LIPASE, BLOOD      Component Value Range   Lipase 39  11 - 59 U/L  POCT I-STAT TROPONIN I      Component Value Range   Troponin i, poc 0.00  0.00 - 0.08 ng/mL   Comment 3            Dg Chest Portable 1 View  12/24/2011  *RADIOLOGY REPORT*  Clinical Data: Nausea and chest pain.  PORTABLE CHEST - 1 VIEW  Comparison: None.  Findings: 2350 hours. The heart size and mediastinal  contours are normal. The lungs are clear. There is no pleural effusion or pneumothorax. No acute osseous  findings are identified.  IMPRESSION: No active cardiopulmonary process.   Original Report Authenticated By: Gerrianne Scale, M.D.    Presentation suggests gerd with h/o same. GI cocktail, pepcid and protonix provided. Over 24 hours of continuous symptoms, ECG and troponin neg - doubt cardiac etiology.  GI cocktail, pepcid and protonix provided - on recheck improving condition, still anxious - ativan provided  1:52 AM feeling much better, PT declines admit. She has close PCP f/u and agrees to call her PCP in the am. For further evaluation.  MDM   VS and nursing notes reviewed. ECG, CXR , labs.  Medications and IVFs provided.         Sunnie Nielsen, MD 12/24/11 [phone]

## 2011-12-23 NOTE — ED Notes (Signed)
Pt in c/o epigastric pain and nausea, pt with history of same but states it has never been this bad, recent change in her medication for GERD, pt also c/o chest tightness.

## 2011-12-24 MED ORDER — LORAZEPAM 2 MG/ML IJ SOLN
1.0000 mg | Freq: Once | INTRAMUSCULAR | Status: AC
  Administered 2011-12-24: 1 mg via INTRAVENOUS
  Filled 2011-12-24: qty 1

## 2011-12-24 MED ORDER — ONDANSETRON HCL 4 MG PO TABS: 4.0000 mg | ORAL_TABLET | Freq: Four times a day (QID) | ORAL | Status: AC

## 2011-12-24 MED ORDER — LORAZEPAM 1 MG PO TABS: 1.0000 mg | ORAL_TABLET | Freq: Three times a day (TID) | ORAL | Status: AC | PRN

## 2011-12-24 NOTE — ED Notes (Addendum)
Pt family at desk stating patient feels dizzy, upon entering room pt states she has had episodes of dizziness over the last few days, MD Dierdre Highman notified. VS repeated and stable.

## 2011-12-24 NOTE — ED Notes (Signed)
MD Dierdre Highman at bedside to re-assess patient.

## 2012-01-12 ENCOUNTER — Ambulatory Visit: Payer: Self-pay

## 2012-01-31 ENCOUNTER — Other Ambulatory Visit: Payer: Self-pay | Admitting: Unknown Physician Specialty

## 2012-01-31 DIAGNOSIS — Z1231 Encounter for screening mammogram for malignant neoplasm of breast: Secondary | ICD-10-CM

## 2012-02-28 ENCOUNTER — Ambulatory Visit
Admission: RE | Admit: 2012-02-28 | Discharge: 2012-02-28 | Disposition: A | Discharge status: Home / Self Care | Source: Ambulatory Visit | Attending: Unknown Physician Specialty | Admitting: Unknown Physician Specialty

## 2012-02-28 DIAGNOSIS — Z1231 Encounter for screening mammogram for malignant neoplasm of breast: Secondary | ICD-10-CM

## 2012-03-21 DIAGNOSIS — K5790 Diverticulosis of intestine, part unspecified, without perforation or abscess without bleeding: Secondary | ICD-10-CM | POA: Insufficient documentation

## 2012-04-04 DIAGNOSIS — F419 Anxiety disorder, unspecified: Secondary | ICD-10-CM | POA: Insufficient documentation

## 2012-04-04 DIAGNOSIS — F329 Major depressive disorder, single episode, unspecified: Secondary | ICD-10-CM | POA: Insufficient documentation

## 2012-05-09 DIAGNOSIS — K6289 Other specified diseases of anus and rectum: Secondary | ICD-10-CM | POA: Insufficient documentation

## 2012-05-31 ENCOUNTER — Ambulatory Visit: Admitting: Internal Medicine

## 2012-07-29 ENCOUNTER — Ambulatory Visit: Payer: Self-pay | Admitting: Emergency Medicine

## 2012-08-12 ENCOUNTER — Ambulatory Visit: Payer: Self-pay | Admitting: Family Medicine

## 2012-10-03 DIAGNOSIS — R140 Abdominal distension (gaseous): Secondary | ICD-10-CM | POA: Insufficient documentation

## 2012-10-03 DIAGNOSIS — R110 Nausea: Secondary | ICD-10-CM | POA: Insufficient documentation

## 2012-10-11 DIAGNOSIS — K6389 Other specified diseases of intestine: Secondary | ICD-10-CM | POA: Insufficient documentation

## 2012-11-20 DIAGNOSIS — K602 Anal fissure, unspecified: Secondary | ICD-10-CM | POA: Insufficient documentation

## 2012-12-10 ENCOUNTER — Ambulatory Visit: Payer: Self-pay | Admitting: Physician Assistant

## 2013-01-24 ENCOUNTER — Other Ambulatory Visit: Payer: Self-pay

## 2013-01-24 DIAGNOSIS — Z1231 Encounter for screening mammogram for malignant neoplasm of breast: Secondary | ICD-10-CM

## 2013-02-12 HISTORY — PX: FLEXIBLE SIGMOIDOSCOPY: SHX1649

## 2013-03-05 ENCOUNTER — Other Ambulatory Visit: Payer: Self-pay

## 2013-03-05 ENCOUNTER — Ambulatory Visit
Admission: RE | Admit: 2013-03-05 | Discharge: 2013-03-05 | Disposition: A | Discharge status: Home / Self Care | Source: Ambulatory Visit

## 2013-03-05 DIAGNOSIS — Z1231 Encounter for screening mammogram for malignant neoplasm of breast: Secondary | ICD-10-CM

## 2013-04-26 IMAGING — CR DG CHEST 1V PORT
2 series · 2 of 2 positions shown · non-contrast
Comparison: None.

CLINICAL DATA: Nausea and chest pain.

PORTABLE CHEST - 1 VIEW

[AP (1 of 2)]
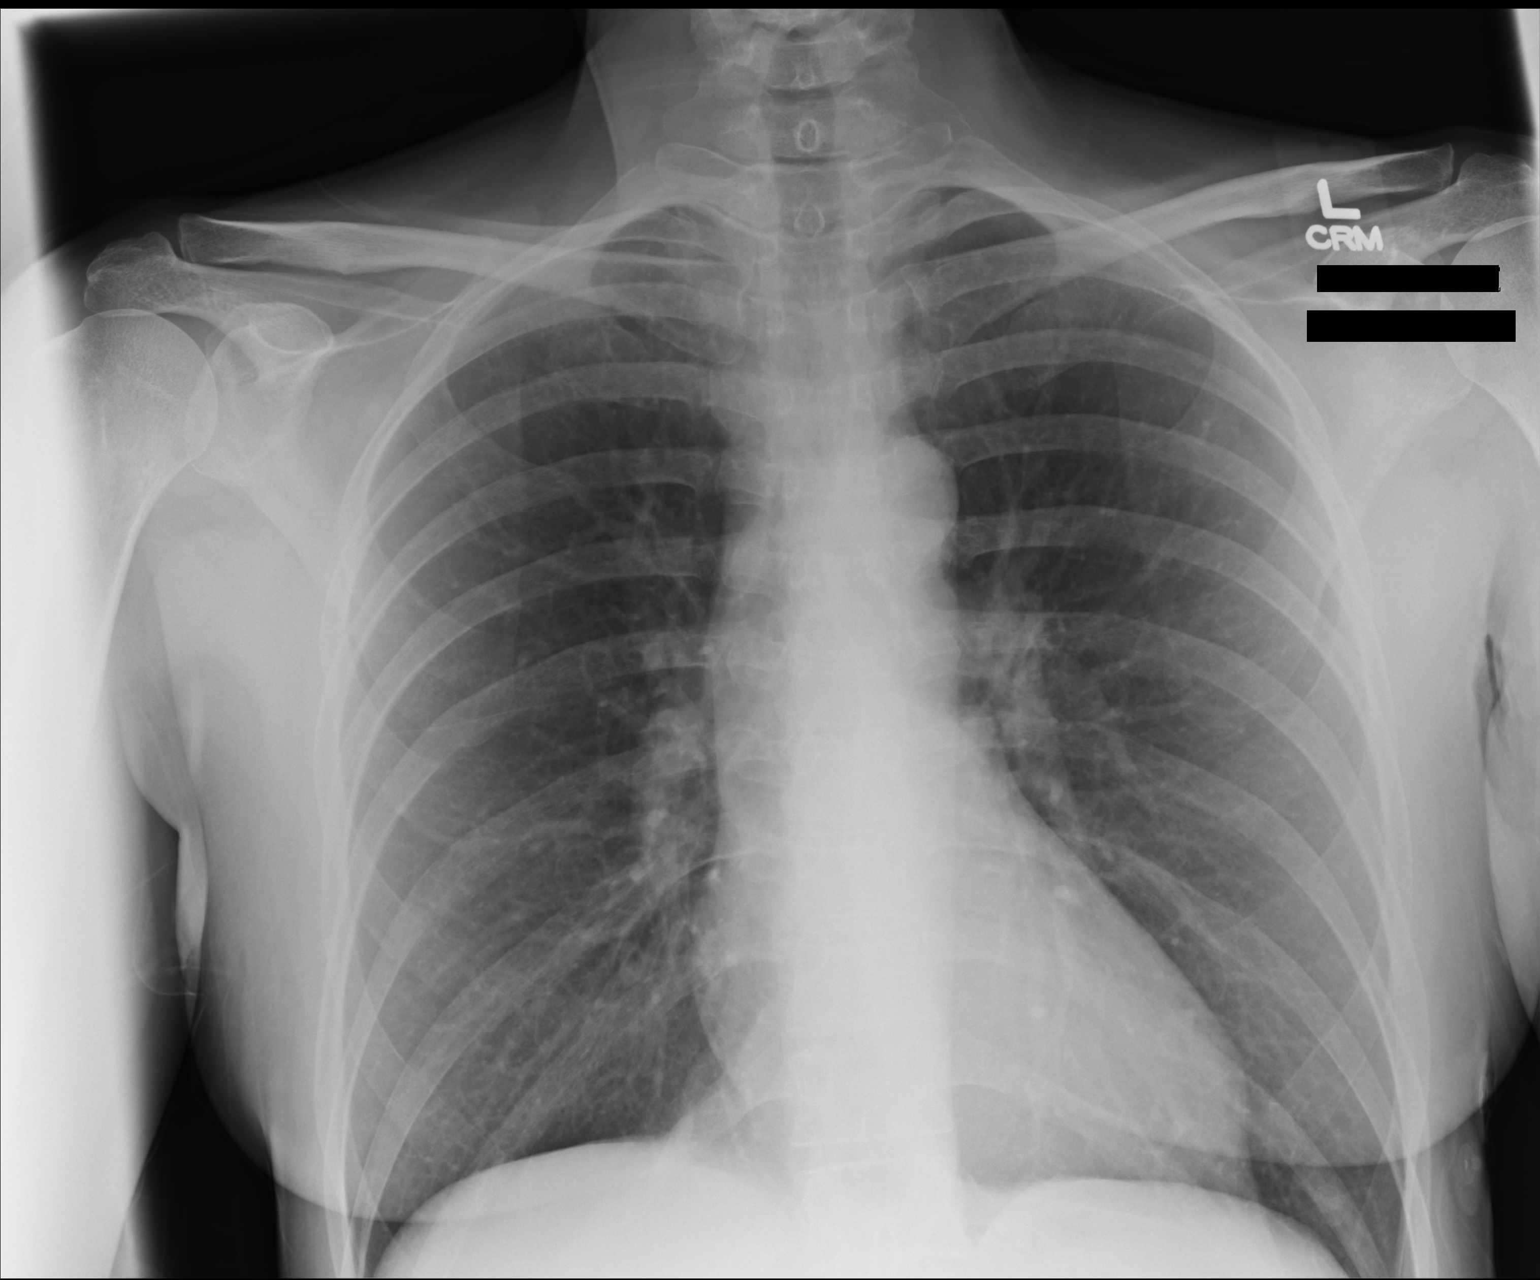

[AP (2 of 2)]
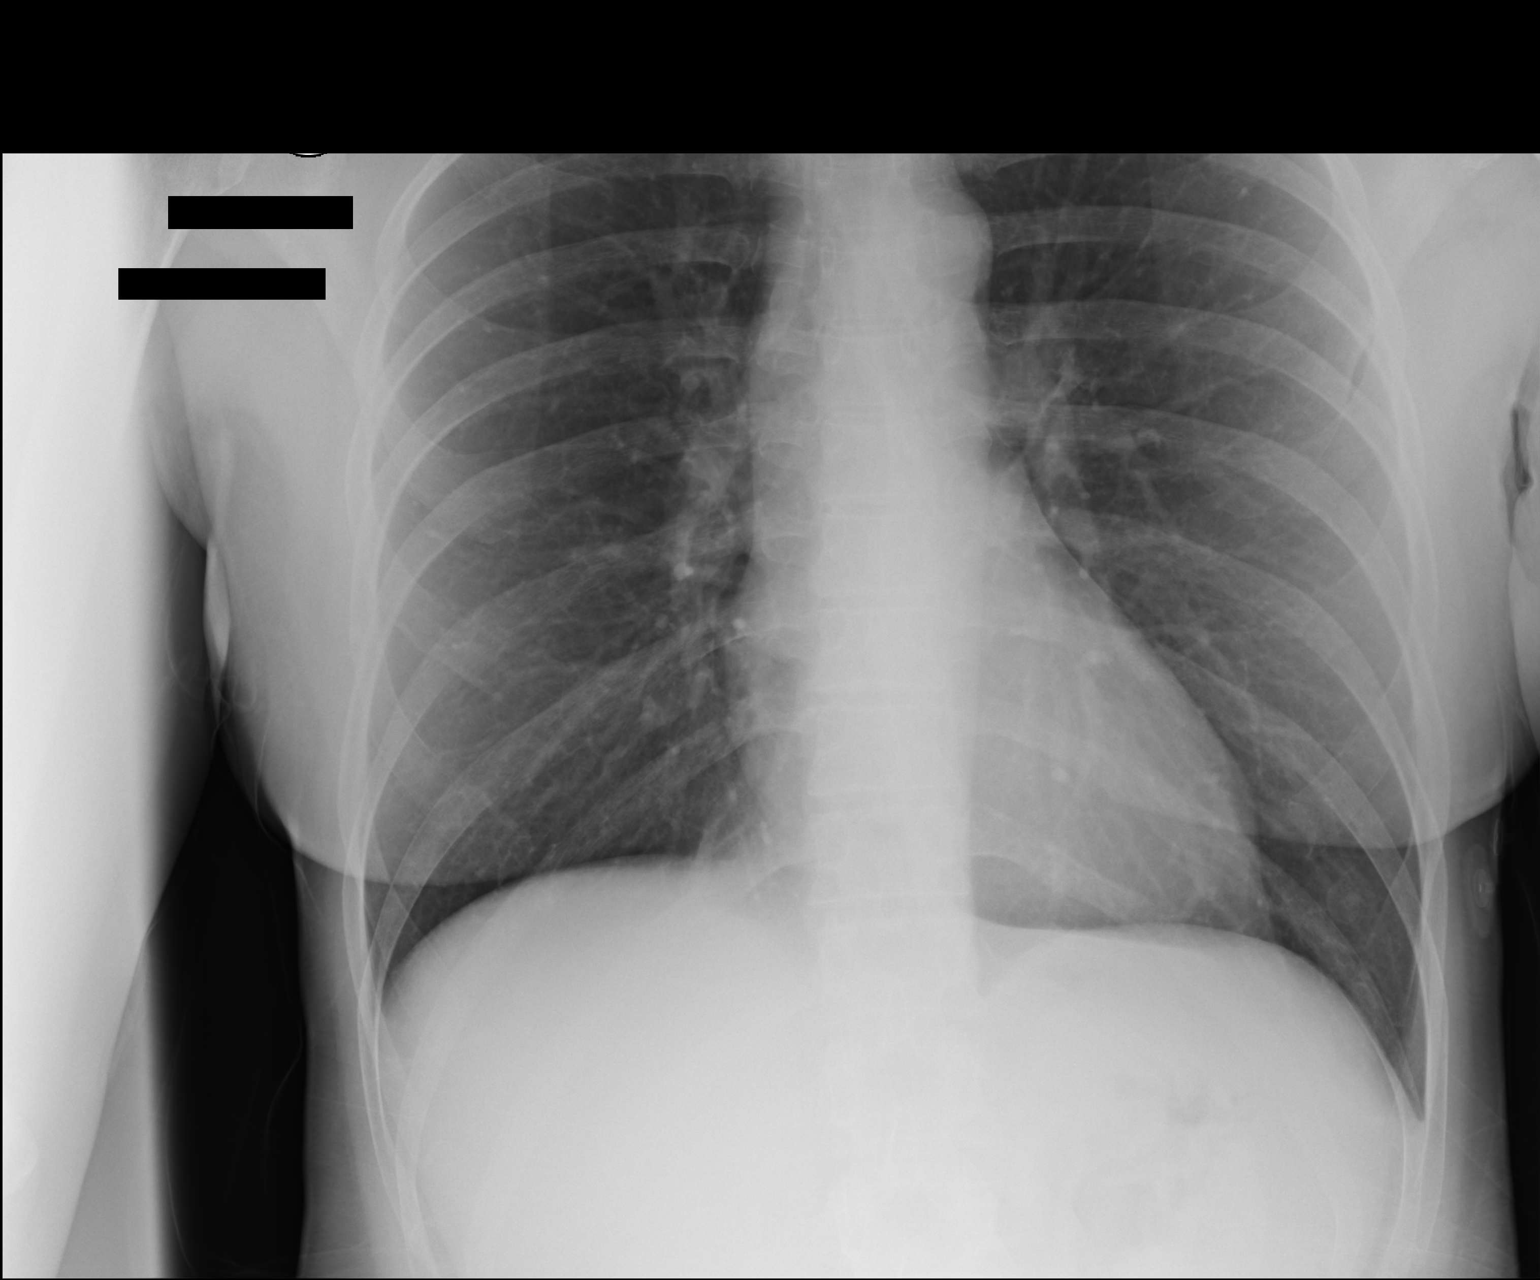

[2 of 2 positions shown; findings below may reference images not displayed]

FINDINGS: 9094 hours. The heart size and mediastinal contours are
normal. The lungs are clear. There is no pleural effusion or
pneumothorax. No acute osseous findings are identified.
IMPRESSION: No active cardiopulmonary process.

## 2013-05-11 ENCOUNTER — Encounter: Payer: Self-pay | Admitting: *Deleted

## 2013-05-16 IMAGING — US ABDOMEN ULTRASOUND
1 series · 13 of 25 positions shown · non-contrast
Comparison: none

REASON FOR EXAM: nausea Hep C weight loss
COMMENTS:

[Series 1: abdomen ultrasound · 0.31mm/px · 13 of 121 slices shown]
[im 1/121]
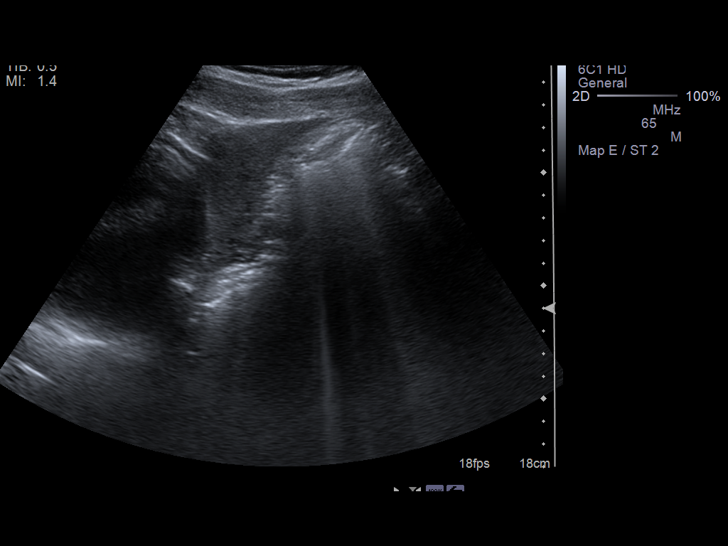
[im 11/121]
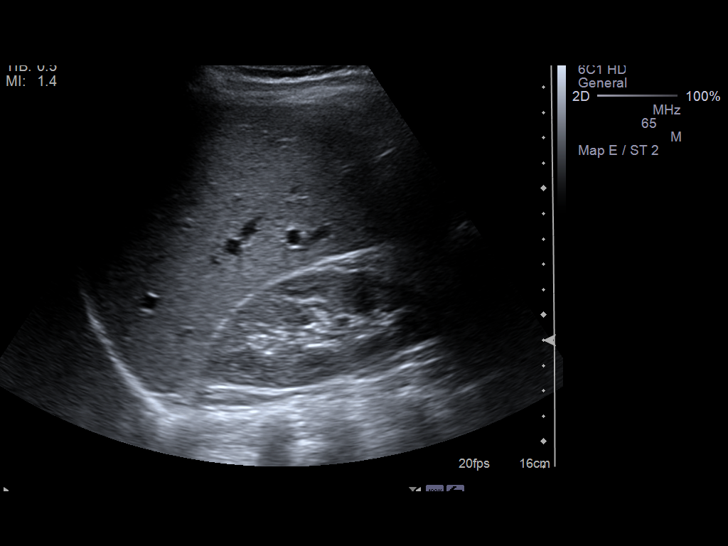
[im 21/121]
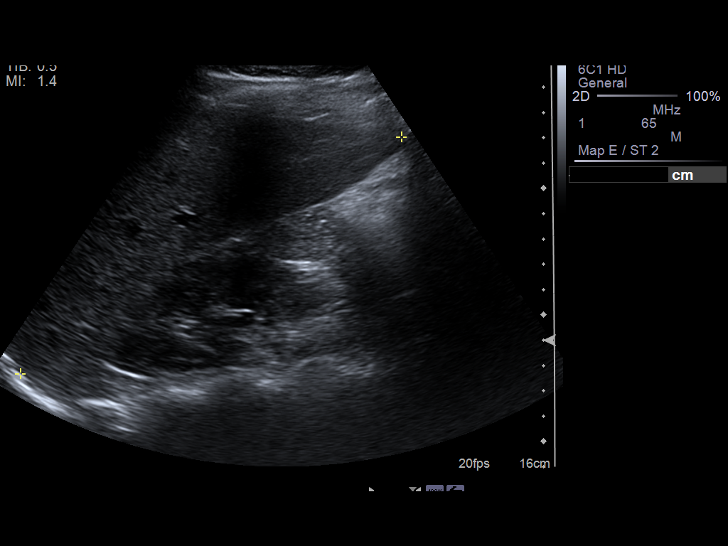
[im 31/121]
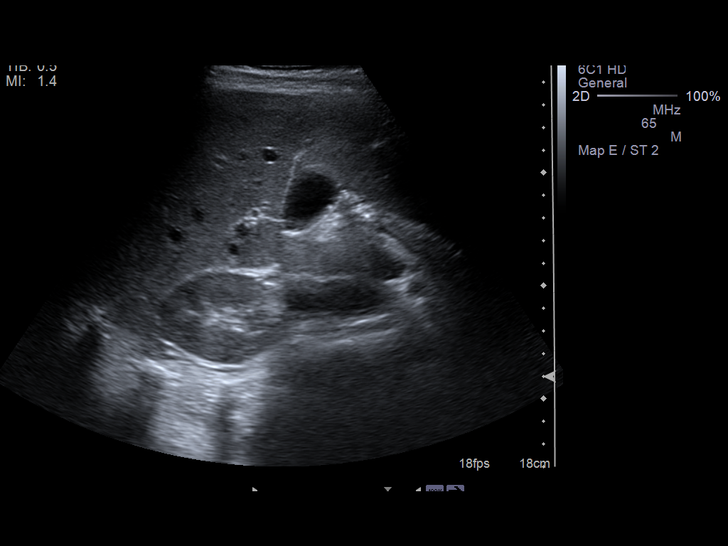
[im 41/121]
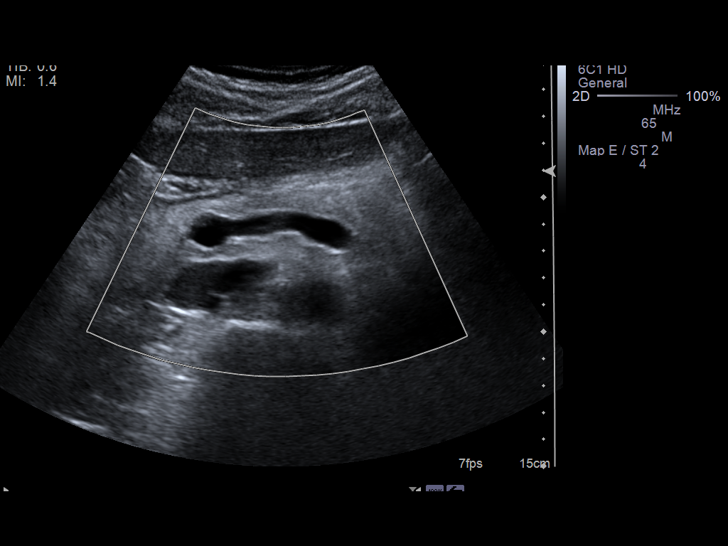
[im 51/121]
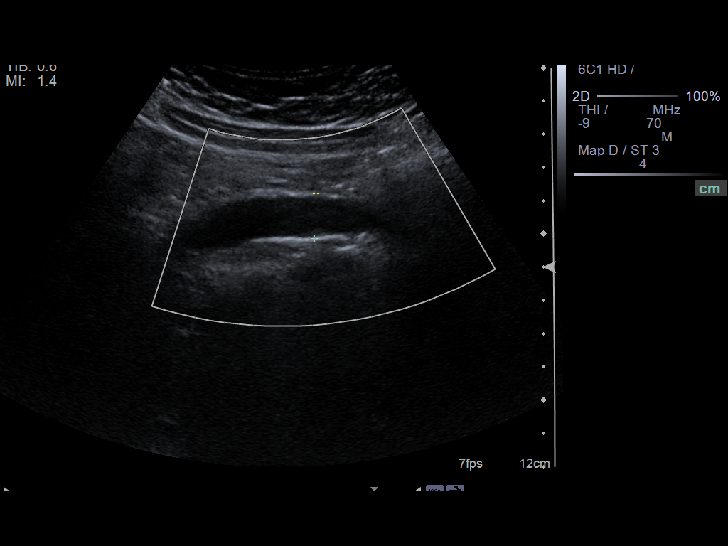
[im 61/121]
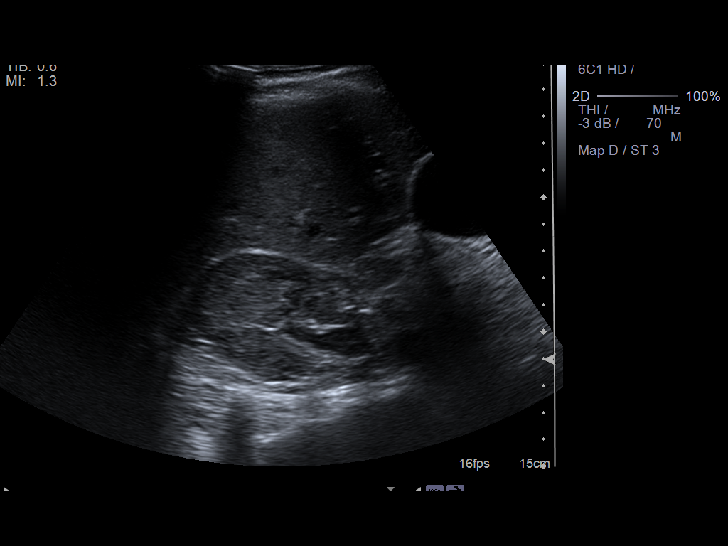
[im 71/121]
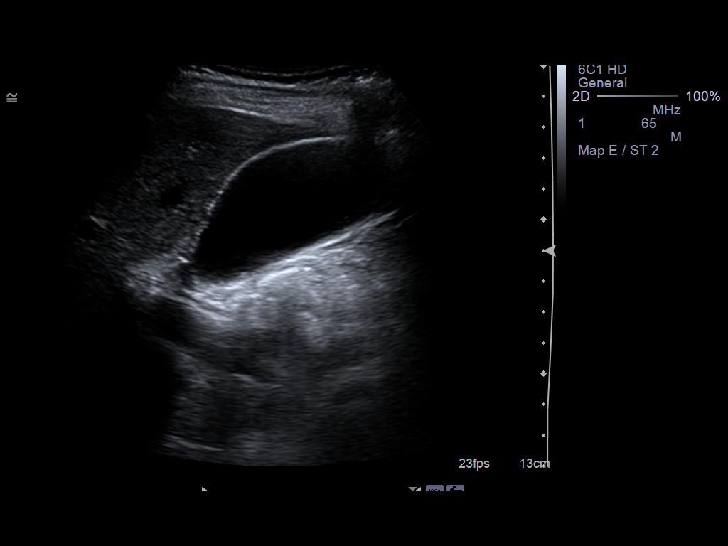
[im 81/121]
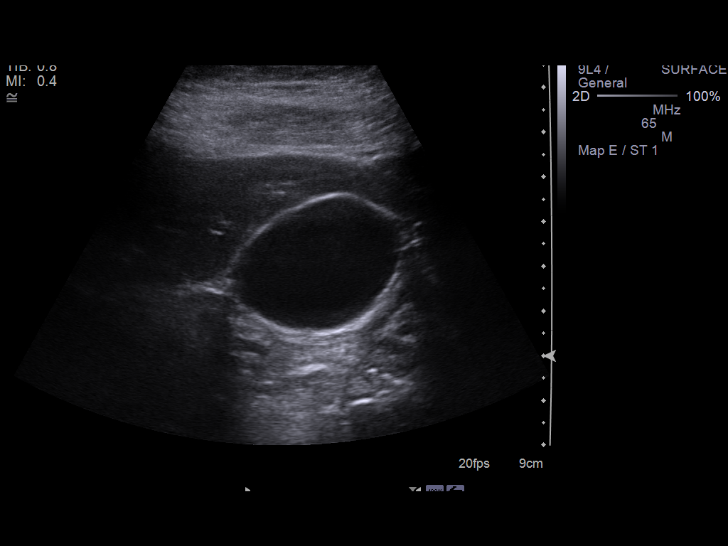
[im 91/121]
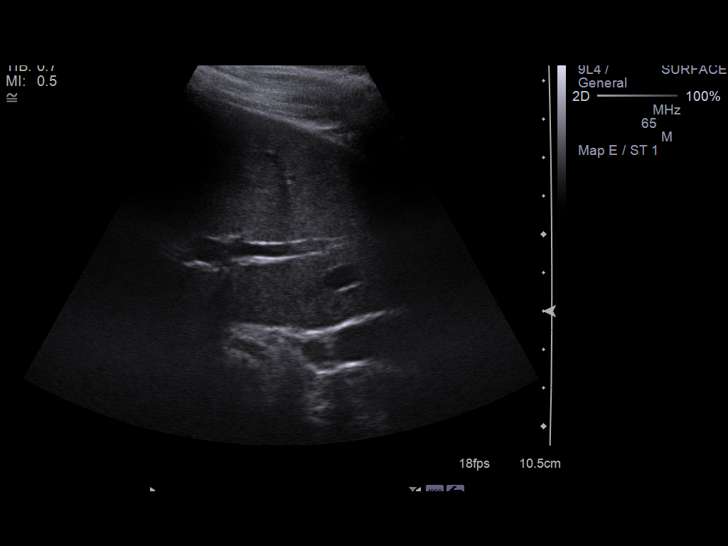
[im 101/121]
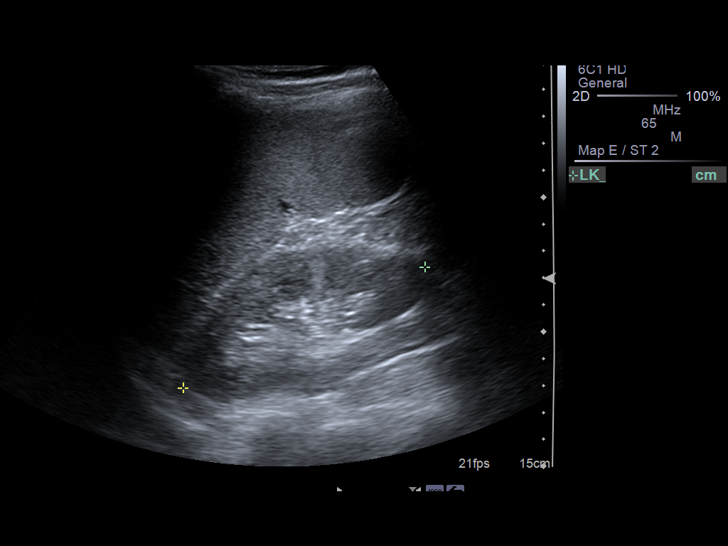
[im 111/121]
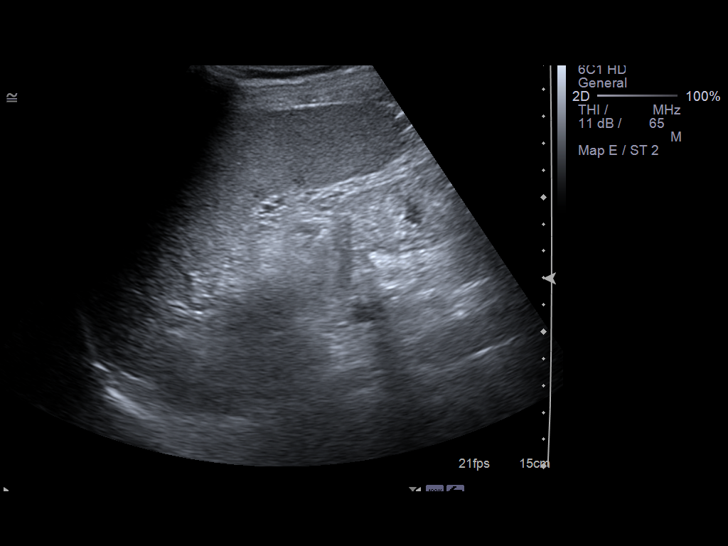
[im 121/121]
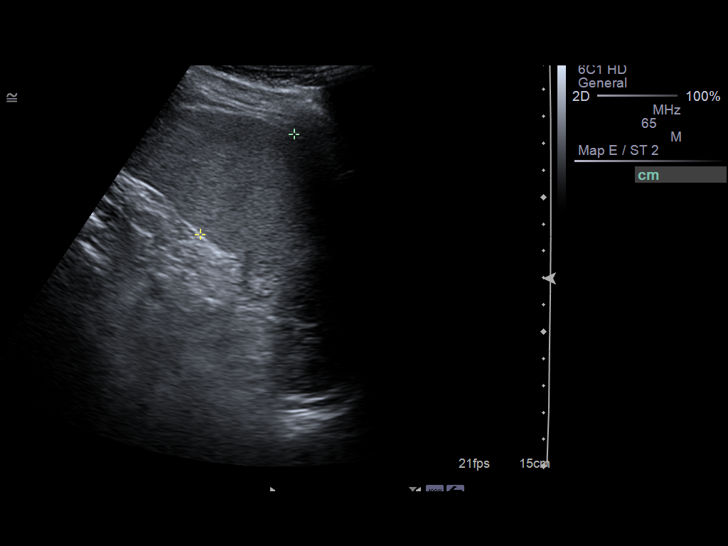

[13 of 25 positions shown; findings below may reference images not displayed]

PROCEDURE:     QUICKFALL - QUICKFALL ABDOMEN GENERAL SURVEY  - January 12, 2012  [DATE]

RESULT:     The liver appears enlarged measuring as much as 17.7 cm. Its
echotexture is normal. No focal hepatic mass or ductal dilation is
demonstrated. The gallbladder is adequately distended and contains an
echogenic nonmobile nonshadowing focus which may reflect a 3 mm diameter
polyp versus adherent stone. There is no positive sonographic Murphy's sign.
There is no gallbladder wall thickening or pericholecystic fluid. The common
bile duct is normal at 2 mm in diameter.

The spleen is enlarged measuring 14.3 centimeters in greatest dimension.
Only portions of the pancreas were demonstrated due to the presence of bowel
gas. These portions appear normal. The abdominal aorta, visualized portions
of the inferior vena cava, and kidneys are normal in appearance. No
significant ascites was demonstrated.
IMPRESSION: 1. The liver is enlarged but exhibits no focal mass or ductal dilation.
There is also splenomegaly. These findings may be related to relative
hepatic dysfunction due to the patient's known hepatitis.
2. The gallbladder exhibits no acute inflammatory change. An adherent
nonshadowing stone versus polyp is demonstrated near the gallbladder neck.
3. The common bile duct does not appear dilated at 2 mm.

[REDACTED]

## 2013-05-30 ENCOUNTER — Ambulatory Visit (INDEPENDENT_AMBULATORY_CARE_PROVIDER_SITE_OTHER): Admitting: General Surgery

## 2013-05-30 ENCOUNTER — Encounter: Payer: Self-pay | Admitting: General Surgery

## 2013-05-30 VITALS — BP 110/82 | HR 80 | Resp 12 | Ht 66.0 in

## 2013-05-30 DIAGNOSIS — K649 Unspecified hemorrhoids: Secondary | ICD-10-CM

## 2013-05-30 DIAGNOSIS — K6289 Other specified diseases of anus and rectum: Secondary | ICD-10-CM

## 2013-05-30 LAB — POC HEMOCCULT BLD/STL (OFFICE/1-CARD/DIAGNOSTIC): Fecal Occult Blood, POC: NEGATIVE

## 2013-05-30 NOTE — Patient Instructions (Signed)
Follow up appointment to be announced.  

## 2013-05-30 NOTE — Progress Notes (Signed)
Patient ID: Courtney Romero, female   DOB: [birth year], 53 y.o.   MRN: [id number]  Chief Complaint  Patient presents with  . Other    hemorrhoids    HPI Courtney Romero is a 53 y.o. female here today for an evaluation of hemorrhoids.  Patient states thet have been there for about an year. She states they have been swelling but no bleeding.No pain with bowel movement. She states when she stand for a long period of time she felts pain on left side of her anal area. She states she has tried all kinds of medications for these hemorrhoids.  The patient reports that she had previously been followed by Richmond Campbell, M.D. In [place]. She describes undergoing individual hemorrhoid banding on 3 occasions that year, and then within a few months developing unexplained nausea and vomiting. She had a CT scan completed at [facility] at that time, and reports an ultrasound was completed as well. By her report, a small adrenal lesion was identified and she subsequently underwent 24 hour urine collection as well as what sounds like a dexamethasone suppression test. The patient reports she was told the lesion was nonfunctioning. No specific plan for followup was made clear to the patient.  The patient reports her pain is worse when she is standing, and occasionally can be prevented by being active or sitting.   HPI  Past Medical History  Diagnosis Date  . GERD (gastroesophageal reflux disease)   . Hepatitis C 2001    Followed by Dr. Charlean Sanfilippo at [facility]  . Abnormal CAT scan 2013    Past Surgical History  Procedure Laterality Date  . Breast lumpectomy    . Hemorrhoid banding  2012    3 times over three months  . Upper gi endoscopy  2013  . Colonoscopy  2011  . Sigmoidoscopy  2013  . Flexible sigmoidoscopy  February 12, 2013    have normal perianal exam, question focal prolapse. 2 small scars in the rectum.    Family History  Problem Relation Age of Onset  . Hypertension Mother   . Thyroid  disease Mother     Multi problems  . Coronary artery disease Father   . Heart failure Father   . Atrial fibrillation Father   . Diabetes Brother     Social History History  Substance Use Topics  . Smoking status: Former Research scientist (life sciences)  . Smokeless tobacco: Never Used  . Alcohol Use: No    Allergies  Allergen Reactions  . Thioridazine Hcl     REACTION: Reaction not known    Current Outpatient Prescriptions  Medication Sig Dispense Refill  . cholecalciferol (VITAMIN D) 1000 UNITS tablet Take 2,000 Units by mouth every morning.       Marland Kitchen estradiol (ESTRACE) 0.5 MG tablet Take 1 mg by mouth every evening.       Marland Kitchen LORazepam (ATIVAN) 0.5 MG tablet Take 0.5 mg by mouth every 8 (eight) hours.      Marland Kitchen omeprazole (PRILOSEC) 40 MG capsule Take 40 mg by mouth daily.       No current facility-administered medications for this visit.    Review of Systems Review of Systems  Constitutional: Negative.   Respiratory: Negative.   Cardiovascular: Negative.   Gastrointestinal: Negative.     Blood pressure 110/82, pulse 80, resp. rate 12, height 5\' 6"  (1.676 m).  Physical Exam Physical Exam  Constitutional: She is oriented to person, place, and time. She appears well-developed and well-nourished.  Eyes: Conjunctivae are  normal.  Neck: Neck supple.  Cardiovascular: Normal rate, regular rhythm and normal heart sounds.   Pulmonary/Chest: Breath sounds normal.  Abdominal: Soft. Bowel sounds are normal. There is no hepatosplenomegaly. There is no tenderness.  Genitourinary:     Digital rectal exam was undertaken after making use of 5 cc of 2% Xylocaine jelly. This was well tolerated. Normal squeeze. Normal bulk noted in the puborectalis muscle. No palpable tenderness or nodularity.  Lymphadenopathy:       Right: No inguinal adenopathy present.       Left: No inguinal adenopathy present.  Neurological: She is alert and oriented to person, place, and time.  Skin: Skin is warm and dry.    Data  Reviewed CT scan of the abdomen and pelvis completed in October 2013 for nausea, abdominal pain or weight loss was available for review on May 31, 2013. No evidence of a perianal process, soft tissue mass or lesions within the puborectalis musculature was identified on my review. There may have been a small left adrenal nodule evident, but is not possible to measure its size on the submitted disc software. The accompanying report to this study was not available.  Office notes from Gwinda Maine, M.D. At [facility] dated March 26, 2013 describes chronic rectal pain of unclear etiology. His exam showed no evidence of fissure, fistula or abscess. External skin tags with grade 2 hemorrhoids on anoscopy. She was prescribed B. & O. Suppositories, the reported that the cost was prohibitive and she did not fill the prescription.  Anoscopy showed evidence of a pale area approximately 1 cm in diameter the left lateral rectal wall about 1.5 cm above the dentate line. This corresponds to the area described as scarring on the patient's February 07, 2013 flexible sigmoidoscopy completed at [facility]. A central capillary was present within the lesion, but otherwise it was flat compared to the adjacent mucosa. A second lesion was not identified as described on the above-mentioned flexible sigmoidoscopy.  Minimal residual internal hemorrhoidal tissue was identified. A small 6 x 12 mm area of lower rectal mucosa is appreciated to prolapse with vigorous straining at the 7:00 position (dorsal lithotomy). The remaining visualized lower rectal mucosa was unremarkable. Stool was Hemoccult negative.  There was a suggestion of a chronic posterior fissure at 6:00, but no hypertrophic tissue adjacent to this area was appreciated, and no focal tenderness was noted on palpation.    Assessment    Perineal pain, unclear etiology.     Plan    There appears to be a posterior fissure, but the area is nontender to  palpation, shows no bleeding and she describes no pain with defecation. The patient reports that she is less likely to have perianal pain after a bowel movement if she is moving rather than simply standing (as in the shower). 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  At the time of her office exam, the CT images were not available, but have been reviewed as described above. The scan was completed in late 2013. By history, most of her symptoms developed after this time.  Records will be obtained from Dr. Liliane Channel office office to see if this shows any light on her rectal pain.  She will be contacted with these records are available for review.         Robert Bellow 05/31/2013, 9:26 PM

## 2013-05-31 ENCOUNTER — Encounter: Payer: Self-pay | Admitting: General Surgery

## 2013-05-31 DIAGNOSIS — K6289 Other specified diseases of anus and rectum: Secondary | ICD-10-CM | POA: Insufficient documentation

## 2013-05-31 DIAGNOSIS — K649 Unspecified hemorrhoids: Secondary | ICD-10-CM | POA: Insufficient documentation

## 2013-06-01 ENCOUNTER — Telehealth: Payer: Self-pay | Admitting: *Deleted

## 2013-06-01 NOTE — Telephone Encounter (Signed)
Please notify the patient I reviewed her October 2013 [facility] CT. No abnormality is appreciated in the area of the rectum or anus. This was the only study that they submitted for review. No studies in 2014.     Notified patient as instructed, patient pleased.

## 2013-06-05 ENCOUNTER — Telehealth: Payer: Self-pay | Admitting: *Deleted

## 2013-06-05 NOTE — Telephone Encounter (Signed)
Notified patient as instructed, patient pleased. Discussed follow-up appointments, patient agrees. March 2,2015 @9 .30

## 2013-06-05 NOTE — Telephone Encounter (Signed)
Message copied by Chrystie Nose on Tue Jun 05, 2013 12:48 PM ------      Message from: [place], [place] W      Created: Mon Jun 04, 2013  2:41 PM       Notify patient Dr. Liliane Channel records reviewed re: hemorrhoid banding.       Would suggest a f/u visit at her convenience to review options. Thanks.  ------

## 2013-06-18 ENCOUNTER — Other Ambulatory Visit: Payer: Self-pay

## 2013-06-18 ENCOUNTER — Ambulatory Visit (INDEPENDENT_AMBULATORY_CARE_PROVIDER_SITE_OTHER): Admitting: General Surgery

## 2013-06-18 ENCOUNTER — Encounter: Payer: Self-pay | Admitting: General Surgery

## 2013-06-18 VITALS — BP 110/68 | HR 72 | Resp 12 | Ht 67.0 in | Wt 142.0 lb

## 2013-06-18 DIAGNOSIS — D3500 Benign neoplasm of unspecified adrenal gland: Secondary | ICD-10-CM

## 2013-06-18 DIAGNOSIS — K6289 Other specified diseases of anus and rectum: Secondary | ICD-10-CM

## 2013-06-18 DIAGNOSIS — K649 Unspecified hemorrhoids: Secondary | ICD-10-CM

## 2013-06-18 NOTE — Patient Instructions (Addendum)
Follow up appointment to be announced.    Patient has been scheduled for a CT of the abdomen and pelvis with contrast for 06/25/13 at 9:30 am at [facility] in [place]. She is to arrive by 9:15 am. She will pick up a prep kit before this procedure.

## 2013-06-18 NOTE — Progress Notes (Addendum)
Patient ID: Courtney Romero, female   DOB: 08-03-1960, 53 y.o.   MRN: [id number]  Chief Complaint  Patient presents with  . Other    hemorrhoids    HPI Courtney Romero is a 53 y.o. female here today to discuss hemorrhoids. The patient states no new problems at this time.   HPI  Past Medical History  Diagnosis Date  . GERD (gastroesophageal reflux disease)   . Hepatitis C 2001    Followed by Dr. Charlean Sanfilippo at [facility]  . Abnormal CAT scan 2013    Past Surgical History  Procedure Laterality Date  . Breast lumpectomy    . Hemorrhoid banding  2012    3 times over three months  . Upper gi endoscopy  2013  . Colonoscopy  2011  . Sigmoidoscopy  2013  . Flexible sigmoidoscopy  February 12, 2013    have normal perianal exam, question focal prolapse. 2 small scars in the rectum.    Family History  Problem Relation Age of Onset  . Hypertension Mother   . Thyroid disease Mother     Multi problems  . Coronary artery disease Father   . Heart failure Father   . Atrial fibrillation Father   . Diabetes Brother     Social History History  Substance Use Topics  . Smoking status: Former Research scientist (life sciences)  . Smokeless tobacco: Never Used  . Alcohol Use: No    Allergies  Allergen Reactions  . Thioridazine Hcl     REACTION: Reaction not known    Current Outpatient Prescriptions  Medication Sig Dispense Refill  . cholecalciferol (VITAMIN D) 1000 UNITS tablet Take 2,000 Units by mouth every morning.       Marland Kitchen estradiol (ESTRACE) 0.5 MG tablet Take 1 mg by mouth every evening.       Marland Kitchen LORazepam (ATIVAN) 0.5 MG tablet Take 0.5 mg by mouth every 8 (eight) hours.       No current facility-administered medications for this visit.    Review of Systems Review of Systems  Constitutional: Negative.   Respiratory: Negative.   Cardiovascular: Negative.     Blood pressure 110/68, pulse 72, resp. rate 12, height _0  (1.702 m), weight 142 lb (64.411 kg).  Physical Exam Physical Exam   Constitutional: She is oriented to person, place, and time. She appears well-developed and well-nourished.  Cardiovascular: Normal rate and regular rhythm.   Pulmonary/Chest: Effort normal and breath sounds normal.  Abdominal: Soft. Normal appearance and bowel sounds are normal. There is no hepatosplenomegaly. There is no tenderness. No hernia.  Genitourinary:  Hemorrhoids are identified at the 1 and 7 o'clock position (dorsal lithotomy).  Normal sphincter tone. Minimal tenderness radially at the 1:00 position without clear relation to the hemorrhoid noted above.  Normal palpable lower rectum. Minimal rectocele as previously identified.  Exam was completed using 3 cc of 2% Xylocaine jelly with good patient tolerance.  Lymphadenopathy:       Right: No inguinal adenopathy present.       Left: No inguinal adenopathy present.  Neurological: She is alert and oriented to person, place, and time.  Skin: Skin is warm and dry.    Data Reviewed [place] CT involving the left adrenal gland previously reviewed and submitted to [facility] for inclusion in their database.  Assessment    Pelvic pain, mild prolapsing/hypertrophic perianal skin/modest hemorrhoidal disease.    Plan    The risks associated with hemorrhoidectomy including those of bleeding, infection and loss of continence were reviewed  with the patient and her husband. Prior to embarking surgical treatment for her perineal discomfort, we'll obtain a followup CT of the abdomen and pelvis 2 #1) reassess the previously identified left adrenal adenoma as well as #2) reevaluate the pelvic musculature for an occult pathology.  The patient brought out the possibility of ultrasound or other modalities to evaluate the rectal area. A not aware that ultrasound is being done as a screening exam, only for known tumors. The opportunity to be evaluated in the anal physiology Department [facility] was offered.    Patient has been scheduled for a CT of the  abdomen and pelvis with contrast for 06/25/13 at 9:30 am at [facility] in [place]. She is to arrive by 9:15 am. She will pick up a prep kit before this procedure.   Robert Bellow 07/02/2013, 11:17 AM

## 2013-06-19 DIAGNOSIS — D3500 Benign neoplasm of unspecified adrenal gland: Secondary | ICD-10-CM | POA: Insufficient documentation

## 2013-06-25 ENCOUNTER — Other Ambulatory Visit: Payer: Self-pay | Admitting: *Deleted

## 2013-06-25 ENCOUNTER — Ambulatory Visit: Payer: Self-pay | Admitting: General Surgery

## 2013-06-25 DIAGNOSIS — D3500 Benign neoplasm of unspecified adrenal gland: Secondary | ICD-10-CM

## 2013-06-25 DIAGNOSIS — K6289 Other specified diseases of anus and rectum: Secondary | ICD-10-CM

## 2013-07-02 ENCOUNTER — Telehealth: Payer: Self-pay | Admitting: General Surgery

## 2013-07-02 NOTE — Telephone Encounter (Signed)
CT reviewed. Left adrenal adenoma smaller than on previous scans. No peri-rectal pathology noted. Mild prominence of distal "C" loop and proximal jejunum (? Thickened wall) noted on my review.  Patient contacted, no regular GI problems. Occasionally, she will have frequent, loose, pudding like stools.  These events are transient and she has not noted any dietary pattern.  No weight loss.  No reported blood/ mucous in stools.  The findings are of unlikely clinical significance. The patient will notify the office if she desired to discuss options for management of the anal skin tags, otherwise, follow up will be on an as needed basis.

## 2014-01-20 DIAGNOSIS — N959 Unspecified menopausal and perimenopausal disorder: Secondary | ICD-10-CM | POA: Insufficient documentation

## 2014-01-30 ENCOUNTER — Other Ambulatory Visit: Payer: Self-pay | Admitting: Unknown Physician Specialty

## 2014-01-30 DIAGNOSIS — Z9889 Other specified postprocedural states: Secondary | ICD-10-CM

## 2014-02-18 ENCOUNTER — Encounter: Payer: Self-pay | Admitting: General Surgery

## 2014-03-11 ENCOUNTER — Ambulatory Visit
Admission: RE | Admit: 2014-03-11 | Discharge: 2014-03-11 | Disposition: A | Discharge status: Home / Self Care | Source: Ambulatory Visit | Attending: Unknown Physician Specialty | Admitting: Unknown Physician Specialty

## 2014-03-11 DIAGNOSIS — Z9889 Other specified postprocedural states: Secondary | ICD-10-CM

## 2015-02-04 ENCOUNTER — Other Ambulatory Visit: Payer: Self-pay

## 2015-02-04 DIAGNOSIS — Z1231 Encounter for screening mammogram for malignant neoplasm of breast: Secondary | ICD-10-CM

## 2015-02-24 ENCOUNTER — Encounter: Payer: Self-pay | Admitting: Podiatry

## 2015-02-24 ENCOUNTER — Ambulatory Visit (INDEPENDENT_AMBULATORY_CARE_PROVIDER_SITE_OTHER): Admitting: Podiatry

## 2015-02-24 ENCOUNTER — Ambulatory Visit (INDEPENDENT_AMBULATORY_CARE_PROVIDER_SITE_OTHER)

## 2015-02-24 DIAGNOSIS — M779 Enthesopathy, unspecified: Secondary | ICD-10-CM

## 2015-02-24 DIAGNOSIS — M7740 Metatarsalgia, unspecified foot: Secondary | ICD-10-CM | POA: Diagnosis not present

## 2015-02-24 MED ORDER — METHYLPREDNISOLONE 4 MG PO TBPK: ORAL_TABLET | ORAL | Status: DC

## 2015-02-24 NOTE — Progress Notes (Signed)
She presents today with a year duration of pain to the forefoot and midfoot bilateral. She states it hurts fully squeeze particularly from side-to-side. She states that she's been wearing tennis shoes and has tried different shoes to no avail. She states that the tennis shoes shoes currently exercising in her more than year-old. She denies any trauma to the feet.  Objective: I have reviewed her past medical history medications allergies surgeries and social history. 54 year old white female in no apparent distress. Pulses are strongly palpable neurologic sensorium is intact for Semmes-Weinstein monofilament. Deep tendon reflexes are intact bilateral and muscle strength +5 over 5 dorsiflexion plantar flexors and inverters everters all intrinsic musculature is intact. Orthopedic evaluation demonstrates all joints distal to the ankle full range of motion without crepitation. She has pain on palpation of the forefoot and a medial and lateral compression of the metatarsals mid diaphyseal regions. Radiographs do not demonstrate any type of osseus abnormalities of the rectus foot with a very straight foot. No fractures are identified. Cutaneous evaluation and x-ray supple well-hydrated cutis no corns or calluses noted breakdown scan no open lesions. Evaluation of the shoe gear does demonstrate erectus foot placed in a curved last shoe resulting in lateral compression of the metatarsals and metatarsalgia.  Assessment: Metatarsalgia capsulitis bilateral foot.  Plan: We discussed appropriate shoe gear stretching her sizes ice therapy issue her modifications. Started her on a Medrol Dosepak to be followed by meloxicam. I will follow-up with her in 1 month. We discussed appropriate shoe gear and what she should purchase.

## 2015-03-17 ENCOUNTER — Ambulatory Visit
Admission: RE | Admit: 2015-03-17 | Discharge: 2015-03-17 | Disposition: A | Discharge status: Home / Self Care | Source: Ambulatory Visit

## 2015-03-17 DIAGNOSIS — Z1231 Encounter for screening mammogram for malignant neoplasm of breast: Secondary | ICD-10-CM

## 2015-03-25 ENCOUNTER — Other Ambulatory Visit: Payer: Self-pay | Admitting: Physician Assistant

## 2015-03-25 DIAGNOSIS — R9389 Abnormal findings on diagnostic imaging of other specified body structures: Secondary | ICD-10-CM

## 2015-03-28 ENCOUNTER — Ambulatory Visit
Admission: RE | Admit: 2015-03-28 | Discharge: 2015-03-28 | Disposition: A | Discharge status: Home / Self Care | Source: Ambulatory Visit | Attending: Physician Assistant | Admitting: Physician Assistant

## 2015-03-28 DIAGNOSIS — R938 Abnormal findings on diagnostic imaging of other specified body structures: Secondary | ICD-10-CM | POA: Diagnosis present

## 2015-03-28 DIAGNOSIS — R918 Other nonspecific abnormal finding of lung field: Secondary | ICD-10-CM | POA: Insufficient documentation

## 2015-03-28 DIAGNOSIS — R9389 Abnormal findings on diagnostic imaging of other specified body structures: Secondary | ICD-10-CM

## 2015-03-28 DIAGNOSIS — R911 Solitary pulmonary nodule: Secondary | ICD-10-CM | POA: Diagnosis present

## 2015-03-28 MED ORDER — IOHEXOL 300 MG/ML  SOLN
100.0000 mL | Freq: Once | INTRAMUSCULAR | Status: AC | PRN
  Administered 2015-03-28: 100 mL via INTRAVENOUS

## 2015-03-31 ENCOUNTER — Ambulatory Visit: Admission: RE | Admit: 2015-03-31 | Source: Ambulatory Visit

## 2015-03-31 ENCOUNTER — Other Ambulatory Visit: Payer: Self-pay | Admitting: Physician Assistant

## 2015-03-31 DIAGNOSIS — Z8768 Personal history of other (corrected) conditions arising in the perinatal period: Secondary | ICD-10-CM

## 2015-03-31 DIAGNOSIS — Z87898 Personal history of other specified conditions: Secondary | ICD-10-CM

## 2015-04-04 ENCOUNTER — Ambulatory Visit

## 2015-06-27 ENCOUNTER — Other Ambulatory Visit: Payer: Self-pay | Admitting: Physician Assistant

## 2015-06-27 DIAGNOSIS — R918 Other nonspecific abnormal finding of lung field: Secondary | ICD-10-CM

## 2015-06-30 ENCOUNTER — Ambulatory Visit
Admission: RE | Admit: 2015-06-30 | Discharge: 2015-06-30 | Disposition: A | Discharge status: Home / Self Care | Source: Ambulatory Visit | Attending: Physician Assistant | Admitting: Physician Assistant

## 2015-06-30 DIAGNOSIS — R918 Other nonspecific abnormal finding of lung field: Secondary | ICD-10-CM | POA: Insufficient documentation

## 2015-06-30 DIAGNOSIS — I2510 Atherosclerotic heart disease of native coronary artery without angina pectoris: Secondary | ICD-10-CM | POA: Insufficient documentation

## 2015-06-30 MED ORDER — IOHEXOL 300 MG/ML  SOLN
75.0000 mL | Freq: Once | INTRAMUSCULAR | Status: AC | PRN
  Administered 2015-06-30: 75 mL via INTRAVENOUS

## 2015-07-01 ENCOUNTER — Other Ambulatory Visit: Payer: Self-pay | Admitting: Internal Medicine

## 2015-07-01 DIAGNOSIS — R911 Solitary pulmonary nodule: Secondary | ICD-10-CM

## 2015-07-29 DIAGNOSIS — Z0000 Encounter for general adult medical examination without abnormal findings: Secondary | ICD-10-CM | POA: Insufficient documentation

## 2016-02-09 ENCOUNTER — Encounter: Payer: Self-pay | Admitting: Internal Medicine

## 2016-02-09 ENCOUNTER — Ambulatory Visit (INDEPENDENT_AMBULATORY_CARE_PROVIDER_SITE_OTHER): Admitting: Internal Medicine

## 2016-02-09 VITALS — BP 126/72 | HR 82 | Ht 67.0 in | Wt 143.8 lb

## 2016-02-09 DIAGNOSIS — R0602 Shortness of breath: Secondary | ICD-10-CM

## 2016-02-09 DIAGNOSIS — R059 Cough, unspecified: Secondary | ICD-10-CM

## 2016-02-09 DIAGNOSIS — R05 Cough: Secondary | ICD-10-CM

## 2016-02-09 DIAGNOSIS — R918 Other nonspecific abnormal finding of lung field: Secondary | ICD-10-CM | POA: Diagnosis not present

## 2016-02-09 MED ORDER — ALBUTEROL SULFATE HFA 108 (90 BASE) MCG/ACT IN AERS: 2.0000 | INHALATION_SPRAY | RESPIRATORY_TRACT | 2 refills | Status: DC | PRN

## 2016-02-09 MED ORDER — CETIRIZINE HCL 10 MG PO TABS: 10.0000 mg | ORAL_TABLET | Freq: Every day | ORAL | Status: DC

## 2016-02-09 NOTE — Progress Notes (Signed)
[facility] Consultation      Date: 02/09/2016,   MRN# [id number] Courtney Romero 1960-11-22 Code Status:  Code Status History    This patient does not have a recorded code status. Please follow your organizational policy for patients in this situation.     Hosp day:@LENGTHOFSTAYDAYS @ Referring MD: @ATDPROV @     PCP:      AdmissionWeight: 143 lb 12.8 oz (65.2 kg)                 CurrentWeight: 143 lb 12.8 oz (65.2 kg) Courtney Romero is a 55 y.o. old female seen in consultation for cough at the request of Dr. Virgia Land.     CHIEF COMPLAINT:   cough   HISTORY OF PRESENT ILLNESS  55 yo female seen today for chronic cough for more 4 months Patient was seen by ENT in 11/2015 s/p laryngoscopy +inflammed larynx and was given Prilosec for 6 weeks Cough persisted and was given dexilan and did NOT tolerate that change  Cough is dry, non-productive, moderate severity-she feels like she has to clear her throat all the time to clear mucus Patient is former smoker, states that she has irritation back of throat and sometimes in middle of chest Smoked 1.5 PPD for 20 years  Patient also with h/o 2 R lung nodules subcentimeter RML,RLL stable in size First scan in 03/2015 and second scan in 06/2015 shows no change in nodule size and no acute findings  Patient has no fevers, no chills no weight loss Patient has some increased SOB over last several weeks associated with exercise  Patient has no signs or symptoms of infection at this time      PAST MEDICAL HISTORY   Past Medical History:  Diagnosis Date  . Abnormal CAT scan 2013  . GERD (gastroesophageal reflux disease)   . Hepatitis C 2001   Followed by Dr. Charlean Sanfilippo at [place]   Past Surgical History:  Procedure Laterality Date  . BREAST LUMPECTOMY    . COLONOSCOPY  2011  . FLEXIBLE SIGMOIDOSCOPY  February 12, 2013   have normal perianal exam, question focal prolapse. 2 small  scars in the rectum.  Marland Kitchen HEMORRHOID BANDING  2012   3 times over three months  . SIGMOIDOSCOPY  2013  . UPPER GI ENDOSCOPY  2013     FAMILY HISTORY   Family History  Problem Relation Age of Onset  . Hypertension Mother   . Thyroid disease Mother     Multi problems  . Coronary artery disease Father   . Heart failure Father   . Atrial fibrillation Father   . Diabetes Brother      SOCIAL HISTORY   Social History  Substance Use Topics  . Smoking status: Former Smoker    Packs/day: 1.50    Years: 20.00    Types: Cigarettes  . Smokeless tobacco: Never Used     Comment: quit smoking in 2000  . Alcohol use No     MEDICATIONS    Home Medication:  Current Outpatient Rx  . Order #: [id number] Class: Historical Med  . Order #: [id number] Class: Historical Med  . Order #: [id number] Class: Historical Med  . Order #: [id number] Class: Historical Med  . Order #: [id number] Class: Historical Med  . Order #: [id number] Class: Historical Med  . Order #: [id number] Class: Historical Med  . Order #: [id number] Class: Historical Med  . Order #: [id number] Class: Historical Med    Current Medication:  Current  Outpatient Prescriptions:  .  busPIRone (BUSPAR) 5 MG tablet, Take 5 mg by mouth., Disp: , Rfl:  .  Cholecalciferol (D 1000) 1000 UNITS capsule, Take by mouth., Disp: , Rfl:  .  cholecalciferol (VITAMIN D) 1000 UNITS tablet, Take 2,000 Units by mouth every morning. , Disp: , Rfl:  .  Cholecalciferol (VITAMIN D-1000 MAX ST) 1000 UNITS tablet, Take by mouth., Disp: , Rfl:  .  estradiol (ESTRACE) 0.5 MG tablet, Take 1 mg by mouth every evening. , Disp: , Rfl:  .  estradiol (ESTRACE) 0.5 MG tablet, Take 1 mg by mouth., Disp: , Rfl:  .  estradiol (ESTRACE) 1 MG tablet, Take 1 mg by mouth., Disp: , Rfl:  .  LORazepam (ATIVAN) 0.5 MG tablet, Take 1 mg by mouth., Disp: , Rfl:  .  zolpidem (AMBIEN) 5 MG tablet, Take by mouth., Disp: , Rfl:     ALLERGIES   Propoxyphene; Thioridazine hcl; Bupropion;  Citalopram; Duloxetine; Thioridazine; and Venlafaxine     REVIEW OF SYSTEMS   Review of Systems  Constitutional: Negative for chills, diaphoresis, fever, malaise/fatigue and weight loss.  HENT: Negative for congestion and hearing loss.   Eyes: Negative for blurred vision and double vision.  Respiratory: Positive for cough. Negative for hemoptysis, sputum production, shortness of breath and wheezing.   Cardiovascular: Negative for chest pain, palpitations and orthopnea.  Gastrointestinal: Negative for abdominal pain, heartburn, nausea and vomiting.  Genitourinary: Negative for dysuria and urgency.  Musculoskeletal: Negative for back pain, myalgias and neck pain.  Skin: Negative for rash.  Neurological: Negative for dizziness, tingling, tremors, weakness and headaches.  Endo/Heme/Allergies: Does not bruise/bleed easily.  Psychiatric/Behavioral: Negative for depression, substance abuse and suicidal ideas.  All other systems reviewed and are negative.    VS: BP 126/72 (BP Location: Left Arm, Cuff Size: Normal)   Pulse 82   Ht 5\' 7"  (1.702 m)   Wt 143 lb 12.8 oz (65.2 kg)   SpO2 99%   BMI 22.52 kg/m      PHYSICAL EXAM  Physical Exam  Constitutional: She is oriented to person, place, and time. She appears well-developed and well-nourished. No distress.  HENT:  Head: Normocephalic and atraumatic.  Mouth/Throat: No oropharyngeal exudate.  Eyes: EOM are normal. Pupils are equal, round, and reactive to light. No scleral icterus.  Neck: Normal range of motion. Neck supple.  Cardiovascular: Normal rate, regular rhythm and normal heart sounds.   No murmur heard. Pulmonary/Chest: No stridor. No respiratory distress. She has no wheezes.  Abdominal: Soft. Bowel sounds are normal.  Musculoskeletal: Normal range of motion. She exhibits no edema.  Neurological: She is alert and oriented to person, place, and time. She displays normal reflexes. Coordination normal.  Skin: Skin is warm.  She is not diaphoretic.  Psychiatric: She has a normal mood and affect.         IMAGING   CT Chest 06/2015 IMPRESSION: The right-sided nodular opacities noted on prior study remain stable. No new pulmonary nodular lesions are identified. No lung edema or consolidation.  No adenopathy apparent.  There are foci of coronary artery calcification. Images reviewed 02/09/2016  Images reveiwed  ASSESSMENT/PLAN   55 yo white female with chronic cough and with constant throat clearing in the setting of former smoker with h/o R lung nodules At this time, will treat patient as chronic allergic rhinitis with probable underlying reactive airways disease with  possible mild COPD  1.will start antihistamine with Zyrtec 10 mg at night 2.start albuterol INH  as needed 2 puffs every 4 hrs 3.continue PPI as prescribed 4.will obtain Office spirometry in 2 weeks to assess lung function, as machine was broken today 5.will repeat CT chest to assess lung nodules   I have personally obtained a history, examined the patient, evaluated laboratory and independently reviewed imaging results, formulated the assessment and plan and placed orders.  The Patient requires high complexity decision making for assessment and support, frequent evaluation and titration of therapies, application of advanced monitoring technologies and extensive interpretation of multiple databases.    Patient/Family are satisfied with Plan of action and management. All questions answered  Corrin Parker, M.D.  [facility]  Medical Director [place] Director [facility] Cardio-Pulmonary Department

## 2016-02-09 NOTE — Patient Instructions (Addendum)
Repeat CT chest to assess nodules Start Zyrtec 10 mg at night Albuterol 2 puffs every hr as needed for cough Continue Prilosec Follow up with Office SPiro in 2 weeks   Cough, Adult Coughing is a reflex that clears your throat and your airways. Coughing helps to heal and protect your lungs. It is normal to cough occasionally, but a cough that happens with other symptoms or lasts a long time may be a sign of a condition that needs treatment. A cough may last only 2-3 weeks (acute), or it may last longer than 8 weeks (chronic). CAUSES Coughing is commonly caused by:  Breathing in substances that irritate your lungs.  A viral or bacterial respiratory infection.  Allergies.  Asthma.  Postnasal drip.  Smoking.  Acid backing up from the stomach into the esophagus (gastroesophageal reflux).  Certain medicines.  Chronic lung problems, including COPD (or rarely, lung cancer).  Other medical conditions such as heart failure. HOME CARE INSTRUCTIONS  Pay attention to any changes in your symptoms. Take these actions to help with your discomfort:  Take medicines only as told by your health care provider.  If you were prescribed an antibiotic medicine, take it as told by your health care provider. Do not stop taking the antibiotic even if you start to feel better.  Talk with your health care provider before you take a cough suppressant medicine.  Drink enough fluid to keep your urine clear or pale yellow.  If the air is dry, use a cold steam vaporizer or humidifier in your bedroom or your home to help loosen secretions.  Avoid anything that causes you to cough at work or at home.  If your cough is worse at night, try sleeping in a semi-upright position.  Avoid cigarette smoke. If you smoke, quit smoking. If you need help quitting, ask your health care provider.  Avoid caffeine.  Avoid alcohol.  Rest as needed. SEEK MEDICAL CARE IF:   You have new symptoms.  You cough up  pus.  Your cough does not get better after 2-3 weeks, or your cough gets worse.  You cannot control your cough with suppressant medicines and you are losing sleep.  You develop pain that is getting worse or pain that is not controlled with pain medicines.  You have a fever.  You have unexplained weight loss.  You have night sweats. SEEK IMMEDIATE MEDICAL CARE IF:  You cough up blood.  You have difficulty breathing.  Your heartbeat is very fast.   This information is not intended to replace advice given to you by your health care provider. Make sure you discuss any questions you have with your health care provider.   Document Released: 10/02/2010 Document Revised: 12/25/2014 Document Reviewed: 06/12/2014 Elsevier Interactive Patient Education [employer].

## 2016-02-11 ENCOUNTER — Other Ambulatory Visit: Payer: Self-pay | Admitting: Unknown Physician Specialty

## 2016-02-11 DIAGNOSIS — Z1231 Encounter for screening mammogram for malignant neoplasm of breast: Secondary | ICD-10-CM

## 2016-02-23 ENCOUNTER — Ambulatory Visit
Admission: RE | Admit: 2016-02-23 | Discharge: 2016-02-23 | Disposition: A | Discharge status: Home / Self Care | Source: Ambulatory Visit | Attending: Internal Medicine | Admitting: Internal Medicine

## 2016-02-23 DIAGNOSIS — I700 Atherosclerosis of aorta: Secondary | ICD-10-CM | POA: Insufficient documentation

## 2016-02-23 DIAGNOSIS — R918 Other nonspecific abnormal finding of lung field: Secondary | ICD-10-CM | POA: Insufficient documentation

## 2016-02-23 DIAGNOSIS — D3502 Benign neoplasm of left adrenal gland: Secondary | ICD-10-CM | POA: Insufficient documentation

## 2016-02-23 DIAGNOSIS — R0602 Shortness of breath: Secondary | ICD-10-CM

## 2016-02-26 ENCOUNTER — Encounter: Payer: Self-pay | Admitting: Internal Medicine

## 2016-02-26 ENCOUNTER — Ambulatory Visit (INDEPENDENT_AMBULATORY_CARE_PROVIDER_SITE_OTHER): Admitting: Internal Medicine

## 2016-02-26 VITALS — BP 120/86 | HR 86 | Ht 67.0 in | Wt 146.0 lb

## 2016-02-26 DIAGNOSIS — R0600 Dyspnea, unspecified: Secondary | ICD-10-CM

## 2016-02-26 DIAGNOSIS — J309 Allergic rhinitis, unspecified: Secondary | ICD-10-CM

## 2016-02-26 MED ORDER — AZELASTINE-FLUTICASONE 137-50 MCG/ACT NA SUSP: 1.0000 | Freq: Two times a day (BID) | NASAL | 6 refills | Status: DC

## 2016-02-26 NOTE — Progress Notes (Signed)
[facility] Consultation      Date: 02/26/2016,   MRN# [id number] Courtney Romero Dec 16, 1960 Code Status:  Code Status History    This patient does not have a recorded code status. Please follow your organizational policy for patients in this situation.     Hosp day:@LENGTHOFSTAYDAYS @ Referring MD: @ATDPROV @     PCP:      AdmissionWeight: 146 lb (66.2 kg)                 CurrentWeight: 146 lb (66.2 kg) Courtney Romero is a 55 y.o. old female seen in consultation for cough at the request of Dr. Virgia Land.     CHIEF COMPLAINT:   Cough, throat clearing   HISTORY OF PRESENT ILLNESS  55 yo female seen today for chronic cough for more 4 months Patient was seen by ENT in 11/2015 s/p laryngoscopy +inflammed larynx   Still with Cough is dry, non-productive, moderate severity-she feels like she has to clear her throat all the time to clear mucus Patient is former smoker, states that she has irritation back of throat and sometimes in middle of chest Smoked 1.5 PPD for 20 years  Patient also with h/o 2 R lung nodules subcentimeter RML,RLL stable in size First scan in 03/2015 and second scan in 06/2015 shows no change in nodule size and no acute findings Third CT scan 02/2016 shows stable lung nodules since 03/2015  Patient has no fevers, no chills no weight loss  Patient has no signs or symptoms of infection at this time  Office SPiro shows NORMAL SPIRO Ratio 84% Fev1 3.1 L 104% FVC 3.7 97%    Current Medication:  Current Outpatient Prescriptions:  .  albuterol (PROVENTIL HFA;VENTOLIN HFA) 108 (90 Base) MCG/ACT inhaler, Inhale 2 puffs into the lungs every 4 (four) hours as needed for wheezing or shortness of breath (cough)., Disp: 1 Inhaler, Rfl: 2 .  busPIRone (BUSPAR) 5 MG tablet, Take 5 mg by mouth., Disp: , Rfl:  .  Cholecalciferol (D 1000) 1000 UNITS capsule, Take by mouth., Disp: , Rfl:  .  Cholecalciferol (VITAMIN D-1000 MAX ST) 1000 UNITS  tablet, Take by mouth., Disp: , Rfl:  .  estradiol (ESTRACE) 1 MG tablet, Take 1 mg by mouth., Disp: , Rfl:  .  LORazepam (ATIVAN) 0.5 MG tablet, Take 1 mg by mouth., Disp: , Rfl:  .  zolpidem (AMBIEN) 5 MG tablet, Take by mouth., Disp: , Rfl:   Current Facility-Administered Medications:  .  cetirizine (ZYRTEC) tablet 10 mg, 10 mg, Oral, QHS, [name], MD    ALLERGIES   Propoxyphene; Thioridazine hcl; Bupropion; Citalopram; Duloxetine; Thioridazine; and Venlafaxine     REVIEW OF SYSTEMS   Review of Systems  Constitutional: Negative for chills, diaphoresis, fever, malaise/fatigue and weight loss.  HENT: Positive for congestion.   Respiratory: Positive for cough. Negative for hemoptysis, sputum production, shortness of breath and wheezing.   Cardiovascular: Negative for chest pain, palpitations, orthopnea and leg swelling.  Gastrointestinal: Negative for abdominal pain, heartburn, nausea and vomiting.  Neurological: Negative for weakness.  All other systems reviewed and are negative.    VS: BP 120/86 (BP Location: Left Arm, Cuff Size: Normal)   Pulse 86   Ht 5\' 7"  (1.702 m)   Wt 146 lb (66.2 kg)   SpO2 99%   BMI 22.87 kg/m      PHYSICAL EXAM  Physical Exam  Constitutional: She is oriented to person, place, and time. She appears well-developed and well-nourished.  No distress.  HENT:  Mouth/Throat: No oropharyngeal exudate.  Neck: Neck supple.  Cardiovascular: Normal rate, regular rhythm and normal heart sounds.   No murmur heard. Pulmonary/Chest: Effort normal and breath sounds normal. No stridor. No respiratory distress. She has no wheezes.  Musculoskeletal: Normal range of motion. She exhibits no edema.  Neurological: She is alert and oriented to person, place, and time. No cranial nerve deficit.  Skin: Skin is warm. She is not diaphoretic.  Psychiatric: She has a normal mood and affect.         IMAGING   CT Chest 02/2016 Pulmonary nodules measure 7 mm  or less in size and are stable from 03/28/2015. Images reviewed 02/26/2016  Images reveiwed  ASSESSMENT/PLAN   55 yo white female with chronic cough and with constant throat clearing in the setting of former smoker with h/o R lung nodules At this time, will treat patient as chronic allergic rhinitis.  1.will continue antihistamine with Zyrtec 10 mg at night 2.albuterol INH as needed 2 puffs every 4 hrs 3.continue PPI  4.start DYmista nasal spray 5.follow up CT chest in 6 months  Follow up in 3 months   I have personally obtained a history, examined the patient, evaluated laboratory and independently reviewed imaging results, formulated the assessment and plan and placed orders.  The Patient requires high complexity decision making for assessment and support, frequent evaluation and titration of therapies, application of advanced monitoring technologies and extensive interpretation of multiple databases.    Patient satisfied with Plan of action and management. All questions answered  Corrin Parker, M.D.  [facility]  Medical Director [place] Director [facility] Cardio-Pulmonary Department

## 2016-02-26 NOTE — Patient Instructions (Signed)
continue zyrtec Start Dymista nasal sprays  Follow up in 2 months Follow up CT chest in 6 months

## 2016-03-17 ENCOUNTER — Ambulatory Visit
Admission: RE | Admit: 2016-03-17 | Discharge: 2016-03-17 | Disposition: A | Discharge status: Home / Self Care | Source: Ambulatory Visit | Attending: Unknown Physician Specialty | Admitting: Unknown Physician Specialty

## 2016-03-17 DIAGNOSIS — Z1231 Encounter for screening mammogram for malignant neoplasm of breast: Secondary | ICD-10-CM

## 2016-08-31 ENCOUNTER — Encounter: Payer: Self-pay | Admitting: Internal Medicine

## 2016-09-21 ENCOUNTER — Ambulatory Visit (INDEPENDENT_AMBULATORY_CARE_PROVIDER_SITE_OTHER): Admitting: Internal Medicine

## 2016-09-21 ENCOUNTER — Encounter: Payer: Self-pay | Admitting: Internal Medicine

## 2016-09-21 VITALS — BP 114/84 | HR 71 | Resp 16 | Ht 67.0 in | Wt 148.0 lb

## 2016-09-21 DIAGNOSIS — B182 Chronic viral hepatitis C: Secondary | ICD-10-CM | POA: Insufficient documentation

## 2016-09-21 DIAGNOSIS — R911 Solitary pulmonary nodule: Secondary | ICD-10-CM | POA: Diagnosis not present

## 2016-09-21 NOTE — Progress Notes (Signed)
[facility] Consultation      Date: 09/21/2016,   MRN# [id number] Courtney Romero 1960/06/17 Code Status:  Code Status History    This patient does not have a recorded code status. Please follow your organizational policy for patients in this situation.     Hosp day:@LENGTHOFSTAYDAYS @ Referring MD: @ATDPROV @     PCP:      Admission                  Current  Courtney Romero is a 56 y.o. old female seen in consultation for cough at the request of Dr. Virgia Land.     CHIEF COMPLAINT:   Cough, throat clearing   HISTORY OF PRESENT ILLNESS  56 yo female seen today for chronic cough for more 4 months Patient was seen by ENT in 11/2015 s/p laryngoscopy +inflammed larynx   Still with Cough is dry, non-productive, moderate severity-she feels like she has to clear her throat all the time to clear mucus Patient is former smoker, states that she has irritation back of throat and sometimes in middle of chest Smoked 1.5 PPD for 20 years She works at [employer] and exposed to hair sprays all day long  Patient also with h/o 2 R lung nodules subcentimeter RML,RLL stable in size First scan in 03/2015 and second scan in 06/2015 shows no change in nodule size and no acute findings Third CT scan 02/2016 shows stable lung nodules since 03/2015 Repeat scan in 3 month spending  Patient has no fevers, no chills no weight loss  Patient has no signs or symptoms of infection at this time  Office SPiro shows NORMAL SPIRO Ratio 84% Fev1 3.1 L 104% FVC 3.7 97%  Has stopped dymista, albuterol and zyrtec as planned from last OV  Current Medication:  Current Outpatient Prescriptions:  .  busPIRone (BUSPAR) 5 MG tablet, Take 5 mg by mouth., Disp: , Rfl:  .  Cholecalciferol (D 1000) 1000 UNITS capsule, Take by mouth., Disp: , Rfl:  .  estradiol (ESTRACE) 1 MG tablet, Take 1 mg by mouth., Disp: , Rfl:  .  LORazepam (ATIVAN) 0.5 MG tablet, Take 1 mg by mouth., Disp: , Rfl:  .   zolpidem (AMBIEN) 5 MG tablet, Take by mouth., Disp: , Rfl:  .  omeprazole (PRILOSEC) 40 MG capsule, Take 40 mg by mouth daily., Disp: , Rfl:   Current Facility-Administered Medications:  .  cetirizine (ZYRTEC) tablet 10 mg, 10 mg, Oral, QHS, [name], MD    ALLERGIES   Propoxyphene; Thioridazine hcl; Bupropion; Citalopram; Duloxetine; Thioridazine; and Venlafaxine     REVIEW OF SYSTEMS   Review of Systems  Constitutional: Negative for chills, diaphoresis, fever, malaise/fatigue and weight loss.  HENT: Negative for congestion.   Respiratory: Positive for cough. Negative for hemoptysis, sputum production, shortness of breath and wheezing.   Cardiovascular: Negative for chest pain, palpitations, orthopnea and leg swelling.  Gastrointestinal: Negative for heartburn.  Neurological: Negative for weakness.  All other systems reviewed and are negative.    BP 114/84 (BP Location: Right Arm, Patient Position: Sitting, Cuff Size: Normal)   Pulse 71   Resp 16   Ht 5\' 7"  (1.702 m)   Wt 148 lb (67.1 kg)   SpO2 100%   BMI 23.18 kg/m     PHYSICAL EXAM  Physical Exam  Constitutional: She is oriented to person, place, and time. No distress.  HENT:  Mouth/Throat: No oropharyngeal exudate.  Neck: Neck supple.  Cardiovascular: Normal rate,  regular rhythm and normal heart sounds.   No murmur heard. Pulmonary/Chest: Effort normal and breath sounds normal. No stridor. No respiratory distress. She has no wheezes.  Musculoskeletal: Normal range of motion. She exhibits no edema.  Neurological: She is alert and oriented to person, place, and time. No cranial nerve deficit.  Skin: Skin is warm. She is not diaphoretic.  Psychiatric: She has a normal mood and affect.         IMAGING   CT Chest 02/2016 Pulmonary nodules measure 7 mm or less in size and are stable from 03/28/2015. Images reviewed 09/21/2016  Images reveiwed  ASSESSMENT/PLAN   55 yo white female with chronic  cough and with constant throat clearing in the setting of former smoker with h/o R lung nodules, I think her cosntant thorat clearing is from environmental exposure from her work environment from [employer] with exposure to hair sprays   1.will continue antihistamine with Zyrtec 10 mg at night as needed 2.continue PPI  3.follow up CT chest in 3 months  Follow up in 3 months   Patient satisfied with Plan of action and management. All questions answered  Corrin Parker, M.D.  [facility]  Medical Director [place] Director [facility] Cardio-Pulmonary Department

## 2016-09-21 NOTE — Patient Instructions (Signed)
Follow up CT chest in 3 months

## 2016-12-28 ENCOUNTER — Ambulatory Visit

## 2017-01-04 ENCOUNTER — Ambulatory Visit
Admission: RE | Admit: 2017-01-04 | Discharge: 2017-01-04 | Disposition: A | Discharge status: Home / Self Care | Source: Ambulatory Visit | Attending: Internal Medicine | Admitting: Internal Medicine

## 2017-01-04 ENCOUNTER — Ambulatory Visit: Admitting: Internal Medicine

## 2017-01-04 DIAGNOSIS — I700 Atherosclerosis of aorta: Secondary | ICD-10-CM | POA: Insufficient documentation

## 2017-01-04 DIAGNOSIS — R911 Solitary pulmonary nodule: Secondary | ICD-10-CM

## 2017-01-04 DIAGNOSIS — I2584 Coronary atherosclerosis due to calcified coronary lesion: Secondary | ICD-10-CM | POA: Insufficient documentation

## 2017-01-07 ENCOUNTER — Encounter: Payer: Self-pay | Admitting: Internal Medicine

## 2017-01-11 ENCOUNTER — Encounter: Payer: Self-pay | Admitting: Internal Medicine

## 2017-02-28 ENCOUNTER — Other Ambulatory Visit: Payer: Self-pay | Admitting: Obstetrics and Gynecology

## 2017-02-28 DIAGNOSIS — Z1231 Encounter for screening mammogram for malignant neoplasm of breast: Secondary | ICD-10-CM

## 2017-03-29 ENCOUNTER — Ambulatory Visit
Admission: RE | Admit: 2017-03-29 | Discharge: 2017-03-29 | Disposition: A | Discharge status: Home / Self Care | Source: Ambulatory Visit | Attending: Obstetrics and Gynecology | Admitting: Obstetrics and Gynecology

## 2017-03-29 ENCOUNTER — Other Ambulatory Visit: Payer: Self-pay | Admitting: Obstetrics and Gynecology

## 2017-03-29 DIAGNOSIS — Z1231 Encounter for screening mammogram for malignant neoplasm of breast: Secondary | ICD-10-CM

## 2017-03-29 DIAGNOSIS — R921 Mammographic calcification found on diagnostic imaging of breast: Secondary | ICD-10-CM

## 2017-03-30 ENCOUNTER — Other Ambulatory Visit: Payer: Self-pay | Admitting: Obstetrics and Gynecology

## 2017-03-30 DIAGNOSIS — R928 Other abnormal and inconclusive findings on diagnostic imaging of breast: Secondary | ICD-10-CM

## 2017-04-04 ENCOUNTER — Other Ambulatory Visit: Payer: Self-pay | Admitting: Obstetrics and Gynecology

## 2017-04-04 ENCOUNTER — Other Ambulatory Visit

## 2017-04-04 ENCOUNTER — Encounter

## 2017-04-04 ENCOUNTER — Ambulatory Visit
Admission: RE | Admit: 2017-04-04 | Discharge: 2017-04-04 | Disposition: A | Discharge status: Home / Self Care | Source: Ambulatory Visit | Attending: Obstetrics and Gynecology | Admitting: Obstetrics and Gynecology

## 2017-04-04 DIAGNOSIS — R921 Mammographic calcification found on diagnostic imaging of breast: Secondary | ICD-10-CM

## 2017-04-05 ENCOUNTER — Encounter: Payer: Self-pay | Admitting: Obstetrics and Gynecology

## 2017-04-05 ENCOUNTER — Other Ambulatory Visit: Payer: Self-pay | Admitting: Obstetrics and Gynecology

## 2017-04-05 DIAGNOSIS — N6320 Unspecified lump in the left breast, unspecified quadrant: Secondary | ICD-10-CM

## 2017-04-05 DIAGNOSIS — R928 Other abnormal and inconclusive findings on diagnostic imaging of breast: Secondary | ICD-10-CM

## 2017-04-05 DIAGNOSIS — R921 Mammographic calcification found on diagnostic imaging of breast: Secondary | ICD-10-CM

## 2017-04-08 ENCOUNTER — Encounter: Payer: Self-pay | Admitting: Obstetrics and Gynecology

## 2017-04-08 ENCOUNTER — Other Ambulatory Visit: Payer: Self-pay | Admitting: Obstetrics and Gynecology

## 2017-04-08 ENCOUNTER — Ambulatory Visit
Admission: RE | Admit: 2017-04-08 | Discharge: 2017-04-08 | Disposition: A | Discharge status: Home / Self Care | Source: Ambulatory Visit | Attending: Obstetrics and Gynecology | Admitting: Obstetrics and Gynecology

## 2017-04-08 DIAGNOSIS — R921 Mammographic calcification found on diagnostic imaging of breast: Secondary | ICD-10-CM

## 2017-04-14 ENCOUNTER — Other Ambulatory Visit: Payer: Self-pay | Admitting: Obstetrics and Gynecology

## 2017-04-14 ENCOUNTER — Other Ambulatory Visit: Payer: Self-pay

## 2017-04-14 DIAGNOSIS — N63 Unspecified lump in unspecified breast: Secondary | ICD-10-CM

## 2017-04-18 ENCOUNTER — Ambulatory Visit
Admission: RE | Admit: 2017-04-18 | Discharge: 2017-04-18 | Disposition: A | Discharge status: Home / Self Care | Source: Ambulatory Visit

## 2017-04-18 DIAGNOSIS — N63 Unspecified lump in unspecified breast: Secondary | ICD-10-CM

## 2017-04-19 DIAGNOSIS — Z923 Personal history of irradiation: Secondary | ICD-10-CM

## 2017-04-19 HISTORY — DX: Personal history of irradiation: Z92.3

## 2017-04-20 ENCOUNTER — Ambulatory Visit: Payer: Self-pay | Admitting: Surgery

## 2017-04-20 NOTE — H&P (Signed)
Courtney Romero Documented: 04/20/2017 2:08 PM Location: [facility] Patient #: [id number] DOB: [birth year] Married / Language: Courtney Romero / Race: White Female  History of Present Illness Courtney Moores A. Quinnton Bury MD; 04/20/2017 3:26 PM) Patient words: Patient sent at the request of Dr.Chacko for abnormal screening mammogram. The patient went for a screening mammogram. Her left breast in the upper outer quadrant was a vague area of mammographic abnormality and calcifications. 2 areas were biopsied and by ultrasound there is 1.5 cm mass at the 11:30 12 o'clock position. 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 Patient has no family history of breast cancer except a great aunts. She's had 2 previous lumpectomies for fibrocystic change. She works as a Theme park manager. She had taken hormone replacement until recently. She smoker up until 18 years ago. Patient denies any history of breast pain and will discharge her and recommended.       ADDITIONAL INFORMATION: 2. PROGNOSTIC INDICATORS Results: IMMUNOHISTOCHEMICAL AND MORPHOMETRIC ANALYSIS PERFORMED MANUALLY Estrogen Receptor: 100%, POSITIVE, STRONG STAINING INTENSITY Progesterone Receptor: 100%, POSITIVE, STRONG STAINING INTENSITY Proliferation Marker Ki67: 5% REFERENCE RANGE ESTROGEN RECEPTOR NEGATIVE 0% POSITIVE =>1% REFERENCE RANGE PROGESTERONE RECEPTOR NEGATIVE 0% POSITIVE =>1% All controls stained appropriately Courtney Males MD Pathologist, Electronic Signature ( Signed 04/18/2017) 2. FLUORESCENCE IN-SITU HYBRIDIZATION Results: HER2 - NEGATIVE RATIO OF HER2/CEP17 SIGNALS 1.35 AVERAGE HER2 COPY NUMBER PER CELL 2.10 Reference Range: NEGATIVE HER2/CEP17 Ratio <2.0 and average HER2 copy number <4.0 EQUIVOCAL HER2/CEP17 Ratio <2.0 and average HER2 copy number >=4.0 and <6.0 1 of 3 FINAL for Courtney Romero ([id number]) ADDITIONAL INFORMATION:(continued) POSITIVE HER2/CEP17 Ratio >=2.0 or <2.0 and  average HER2 copy number >=6.0 Courtney Sheller MD Pathologist, Electronic Signature ( Signed 04/15/2017) FINAL DIAGNOSIS Diagnosis 1. 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 - Romero COMMENT. 2. 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 - Romero COMMENT. Microscopic Comment 1. & 2. Specimen #1 shows extensive low grade ductal carcinoma in situ, which is demonstrated with positive p63, smooth muscle myosin heavy chain, and calponin immunostains. Some of the lesion shows attenuation of the myoepithelial layer, and in other areas it is absent entirely. The evaluation of the overall size of the invasive component is problematic, but it appears to be at least 0.3 cm in maximal dimension, and may indeed be greater. The #2 specimen shows a 0.6 cm maximal dimension area of what appears to be primarily low grade ductal carcinoma in situ. An approximately 0.2 cm in maximal dimension area of this process shows negative staining for calponin, smooth muscle myosin heavy chain, and p63 immunostaining, supporting the diagnosis of invasive carcinoma. Other areas of the process show attenuation, and so, as noted above, the actual invasive component may be larger than that measured here. Material will be taken for receptor studies, and the results will be reported in an addendum. (MJ:ah 04/14/17) Courtney Martinique MD Pathologist, Electronic Signature (Case signed 04/14/2017) Specimen Gross and Clinical Information Specimen Comment 1. TIF: 9:20 AM; extracted < 5 min; calcs/distortion 2. TIF: 9:45 AM; extracted < 1 min; mass Specimen(s) Obtained: 1. Breast, left, needle core biopsy, 12:00 o'clock, 4cmfn 2. Breast, left, needle core biopsy, 11:00 o'clock 3cmfn 2 of 3 FINAL for Courtney Romero ([id number]) Specimen Clinical Information 1. Malignancy vs radial scar 2. Malignancy, poss PASH  or other mass forming lesion Gross 1. Received in formalin labeled "Courtney Romero, left breast calcs with distortion UOQ, 12 o'clock 4 cm FN" (time in formalin 9:20 a.m., cold ischemic time less than 5 minutes) are multiple soft to focally firm yellow-white tissue cores which range in size from 1.5 x 0.3 x 0.3 cm up to 2.3 x 0.3 x 0.3 cm. Block summary: A = four cores. B = four cores. C = two cores and fragments. 2. Received in formalin labeled "Courtney Romero, left breast 11 o'clock 3 cm FN" (time in formalin 9:45 a.m., cold ischemic time less than 1 minute) are four soft to rubbery, yellow-white tissue cores which range in size from 1.0 x 0.2 x 0.2 cm up to 1.8 x 0.2 x 0.2 cm. The specimen is entirely submitted in one block. (TB:ecj 04/08/2017) Stain(s) used in Diagnosis: The following stain(s) were used in diagnosing the case: Smooth Muscle Myosin - 1 Heavy Chain, Her2 FISH, P63, ER-ACIS, KI-67-ACIS, PR-ACIS, Calponin. The control(s) stained appropriately. Disclaimer HER2 IQFISH pharmDX (code [id number]) is a direct fluorescence in-situ hybridization assay designed to quantitatively determine HER2 gene amplification in formalin-fixed, paraffin-embedded tissue specimens. It is performed at [facility] and is reported using ASCO/CAP scoring criteria published in 2013. PR progesterone receptor (16), immunohistochemical stains are performed on formalin fixed, paraffin embedded tissue using a 3,3"-diaminobenzidine (DAB) chromogen and Leica Bond Autostainer System. The staining intensity of the nucleus is scored manually and is reported as the percentage of tumor cell nuclei demonstrating specific nuclear staining.Specimens are fixed in 10% Neutral Buffered Formalin for at least 6 hours and up to 72 hours. These tests have not be validated on decalcified tissue. Results should be  interpreted with caution given the possibility of false negative results on decalcified specimens. Ki-67 (MM1), immunohistochemical stains are performed on formalin fixed, paraffin embedded tissue using a 3,3"-diaminobenzidine (DAB) chromogen and Leica Bond Autostainer System. The staining intensity of the nucleus is scored manually and is reported as the percentage of tumor cell nuclei demonstrating specific nuclear staining.Specimens are fixed in 10% Neutral Buffered Formalin for at least 6 hours and up to 72 hours. These tests have not be validated on decalcified tissue. Results should be interpreted with caution given the possibility of false negative results on decalcified specimens. Some of these immunohistochemical stains may have been developed and the performance characteristics determined by [facility]. Some may not have been cleared or approved by the U.S. Food and Drug Administration. The FDA has determined that such clearance or approval is not necessary. This test is used for clinical purposes. It should not be regarded as investigational or for research. This laboratory is certified under the [place] (CLIA-88) as qualified to perform high complexity clinical laboratory testing. Estrogen receptor (6F11), immunohistochemical stains are performed on formalin fixed, paraffin embedded tissue using a 3,3"-diaminobenzidine (DAB) chromogen and Leica Bond Autostainer System. The staining intensity of the nucleus is scored manually and is reported as the percentage of tumor cell nuclei demonstrating specific nuclear staining.Specimens are fixed in 10% Neutral Buffered Formalin for at least 6 hours and up to 72 hours. These tests have not be validated on decalcified tissue. Results should be interpreted with caution given the possibility of false negative results on decalcified specimens. Report signed out from the following  location(s) Technical Component was performed at [facility]. [address],[place] [id number].[id number]., Interpretation was performed at [place], [place], [place]  [address]. CLIA #: [id number],      CLINICAL DATA: Patient recalled from screening for left breast mass and calcifications.  EXAM: 2D DIGITAL DIAGNOSTIC LEFT MAMMOGRAM WITH CAD AND ADJUNCT TOMO  ULTRASOUND LEFT BREAST  COMPARISON: Previous exam(s).  ACR Breast Density Category c: The breast tissue is heterogeneously dense, which may obscure small masses.  FINDINGS: Spot compression CC and full paddle true lateral tomosynthesis images of the left breast were obtained. Additional magnification CC and true lateral views of the left breast obtained.  Within the upper-outer left breast middle depth there is a 1.2 cm group of coarse heterogeneous calcifications, further evaluated with magnification views. There is persistent distortion demonstrated at this location on spot compression mammography.  Mammographic images were processed with CAD.  On physical exam, I palpate no discrete mass within the superior and superolateral left breast.  Targeted ultrasound is performed, showing a 6 x 8 x 10 mm irregular hypoechoic mass left breast 11 o'clock position 3 cm from the nipple.  Within the left breast 12 o'clock position 4 cm from nipple there is a 1.5 x 1.0 x 1.3 cm focal area of posterior acoustic shadowing, felt to correspond with the area of distortion and calcifications on mammography.  IMPRESSION: 1. Persistent focal distortion and calcifications within the superior slightly lateral left breast with sonographic correlate. In order to sample both the calcifications and distortion, recommend biopsy of this area with stereotactic guidance. 2. Indeterminate irregular left breast mass 11 o'clock position.  RECOMMENDATION: 1.  Stereotactic guided core needle biopsy focal distortion and calcifications within the upper-outer left breast. 2. Ultrasound-guided core needle biopsy left breast mass 11 o'clock position.  I have discussed the findings and recommendations with the patient. Results were also provided in writing at the conclusion of the visit. If applicable, a reminder letter will be sent to the patient regarding the next appointment.  BI-RADS CATEGORY 4: Suspicious.   Electronically Signed By: Lovey Newcomer M.D. On: 04/04/2017 12:04.  The patient is a 57 year old female.   Past Surgical History (Tanisha A. Owens Shark, [place]; 04/20/2017 2:08 PM) Breast Biopsy Left. Colon Polyp Removal - Colonoscopy Hysterectomy (not due to cancer) - Partial Tonsillectomy  Diagnostic Studies History (Tanisha A. Owens Shark, [place]; 04/20/2017 2:08 PM) Colonoscopy 5-10 years ago Mammogram within last year Pap Smear 1-5 years ago  Allergies (Tanisha A. Owens Shark, [place]; 04/20/2017 2:10 PM) Propoxyphene Compound *ANALGESICS - OPIOID* Thioridazine HCl *CHEMICALS* BuPROPion HCl *ANTIDEPRESSANTS* Citalopram Hydrobromide *ANTIDEPRESSANTS* DULoxetine HCl *CHEMICALS* Venlafaxine HCl *ANTIDEPRESSANTS* Allergies Reconciled  Medication History (Tanisha A. Owens Shark, [place]; 04/20/2017 2:11 PM) Cholecalciferol (2000UNIT Capsule, Oral) Active. LORazepam (0.5MG Tablet, Oral) Active. Omeprazole (40MG Capsule DR, Oral) Active. Zolpidem Tartrate (5MG Tablet, Oral) Active. Medications Reconciled  Social History (Tanisha A. Owens Shark, [place]; 04/20/2017 2:08 PM) Caffeine use Coffee. No alcohol use No drug use Tobacco use Former smoker.  Family History (Tanisha A. Owens Shark, [place]; 04/20/2017 2:08 PM) Arthritis Mother. Breast Cancer Family Members In General. Colon Polyps Father, Mother. Depression Mother. Diabetes Mellitus Brother. Heart Disease Father. Heart disease in female family member before age 44 Hypertension Father,  Mother. Malignant Neoplasm Of Pancreas Family Members In General. Prostate Cancer Father. Thyroid problems Father, Mother.  Pregnancy / Birth History (Tanisha A. Owens Shark, [place]; 04/20/2017 2:08 PM) Age at menarche 68 years. Age of menopause 51-55 Contraceptive History Oral contraceptives. Gravida 0 Para 0  Other Problems (Tanisha A. Owens Shark, [place]; 04/20/2017 2:08 PM) Anxiety Disorder Diverticulosis Gastroesophageal Reflux Disease General anesthesia - complications Hemorrhoids Hepatitis  Review of Systems (Tanisha A. Brown RMA; 04/20/2017 2:08 PM) General Not Present- Appetite Loss, Chills, Fatigue, Fever, Night Sweats, Weight Gain and Weight Loss. Skin Not Present- Change in Wart/Mole, Dryness, Hives, Jaundice, New Lesions, Non-Healing Wounds, Rash and Ulcer. HEENT Present- Wears glasses/contact lenses. Not Present- Earache, Hearing Loss, Hoarseness, Nose Bleed, Oral Ulcers, Ringing in the Ears, Seasonal Allergies, Sinus Pain, Sore Throat, Visual Disturbances and Yellow Eyes. Breast Present- Breast Mass. Not Present- Breast Pain, Nipple Discharge and Skin Changes. Cardiovascular Not Present- Chest Pain, Difficulty Breathing Lying Down, Leg Cramps, Palpitations, Rapid Heart Rate, Shortness of Breath and Swelling of Extremities. Gastrointestinal Present- Hemorrhoids and Rectal Pain. Not Present- Abdominal Pain, Bloating, Bloody Stool, Change in Bowel Habits, Chronic diarrhea, Constipation, Difficulty Swallowing, Excessive gas, Gets full quickly at meals, Indigestion, Nausea and Vomiting. Musculoskeletal Present- Back Pain and Joint Pain. Not Present- Joint Stiffness, Muscle Pain, Muscle Weakness and Swelling of Extremities. Psychiatric Present- Anxiety. Not Present- Bipolar, Change in Sleep Pattern, Depression, Fearful and Frequent crying. Endocrine Present- Hot flashes. Not Present- Cold Intolerance, Excessive Hunger, Hair Changes, Heat Intolerance and New Diabetes. Hematology Not  Present- Blood Thinners, Easy Bruising, Excessive bleeding, Gland problems, HIV and Persistent Infections.  Vitals (Tanisha A. Brown RMA; 04/20/2017 2:09 PM) 04/20/2017 2:08 PM Weight: 151.4 lb Height: 67in Body Surface Area: 1.8 m Body Mass Index: 23.71 kg/m  Temp.: 97.82F  Pulse: 102 (Regular)  BP: 124/82 (Sitting, Left Arm, Standard)      Physical Exam (Toluwanimi Radebaugh A. Estanislado Surgeon MD; 04/20/2017 3:26 PM)  General Mental Status-Alert. General Appearance-Consistent with stated age. Hydration-Well hydrated. Voice-Normal.  Chest and Lung Exam Chest and lung exam reveals -quiet, even and easy respiratory effort with no use of accessory muscles and on auscultation, normal breath sounds, no adventitious sounds and normal vocal resonance. Inspection Chest Wall - Normal. Back - normal.  Breast Note: Left breast shows mild bruising. Previous lobectomy scar noted. Minimal volume loss noted. Right breast is normal.  Cardiovascular Cardiovascular examination reveals -normal heart sounds, regular rate and rhythm with no murmurs and normal pedal pulses bilaterally.  Neurologic Neurologic evaluation reveals -alert and oriented x 3 with no impairment of recent or remote memory. Mental Status-Normal.  Lymphatic Head & Neck  General Head & Neck Lymphatics: Bilateral - Description - Normal. Axillary  General Axillary Region: Bilateral - Description - Normal. Tenderness - Non Tender.    Assessment & Plan (Harlow Carrizales A. Sakira Dahmer MD; 04/20/2017 3:27 PM)  BREAST CANCER, LEFT (C50.912) Impression: Referral for MRI given the mammographic density and calcifications. Given her previous lumpectomy surgeries, this will be helpful plan whether breast conservation possible versus mastectomy and reconstruction. Risk of lumpectomy include bleeding, infection, seroma, more surgery, use of seed/wire, wound care, cosmetic deformity and the need for other treatments, death , blood clots, death.  Pt agrees to proceed. Discussed treatment options for breast cancer to include breast conservation vs mastectomy with reconstruction. Pt has decided on mastectomy. Risk include bleeding, infection, flap necrosis, pain, numbness, recurrence, hematoma, other surgery needs. Pt understands and agrees to proceed. Risk of sentinel lymph node mapping include bleeding, infection, lymphedema, shoulder pain. stiffness, dye allergy. cosmetic deformity , blood clots, death, need for more surgery.  Refer to medical radiation oncology. May need referral to plastics she opts for mastectomy.   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 We discussed the staging and pathophysiology of breast cancer. We discussed all of the different options for treatment for breast cancer including surgery, chemotherapy, radiation therapy, Herceptin, and antiestrogen therapy. We discussed a sentinel lymph node biopsy as she does not appear to having lymph node involvement right now. We discussed the performance of that with injection of radioactive tracer and blue dye. We discussed that she would have an incision underneath her axillary hairline. We discussed that there is a bout a 10-20% chance of having a positive node with a sentinel lymph node biopsy and we will await the permanent pathology to make any other first further decisions in terms of her treatment. One of these options might be to return to the operating room to perform an axillary lymph node dissection. We discussed about a 1-2% risk lifetime of chronic shoulder pain as well as lymphedema associated with a sentinel lymph node biopsy. We discussed the options for treatment of the breast cancer which included lumpectomy versus a  mastectomy. We discussed the performance of the lumpectomy with a wire placement. We discussed a 10-20% chance of a positive margin requiring reexcision in the operating room. We also discussed that she may need radiation therapy or antiestrogen therapy or both if she undergoes lumpectomy. We discussed the mastectomy and the postoperative care for that as well. We discussed that there is no difference in her survival whether she undergoes lumpectomy with radiation therapy or antiestrogen therapy versus a mastectomy. There is a slight difference in the local recurrence rate being 3-5% with lumpectomy and about 1% with a mastectomy. We discussed the risks of operation including bleeding, infection, possible reoperation. She understands her further therapy will be based on what her stages at the time of her operation.  Pt Education - [facility] Breast Biopsy HCI: discussed with patient and provided information. Pt Education - [facility] Mastectomy HCI Pt Education - ABC (After Breast Cancer) Class Info: discussed with patient and provided information.

## 2017-04-22 ENCOUNTER — Telehealth: Payer: Self-pay

## 2017-04-22 ENCOUNTER — Other Ambulatory Visit: Payer: Self-pay | Admitting: Surgery

## 2017-04-22 DIAGNOSIS — C50912 Malignant neoplasm of unspecified site of left female breast: Secondary | ICD-10-CM

## 2017-04-22 NOTE — Telephone Encounter (Signed)
Pt called triage line stating she would like AMS to call her. He has been following her recent diagnosis of breast cancer and she has a few questions. [phone]

## 2017-04-25 ENCOUNTER — Telehealth: Payer: Self-pay | Admitting: Oncology

## 2017-04-25 ENCOUNTER — Encounter: Payer: Self-pay | Admitting: Obstetrics and Gynecology

## 2017-04-25 NOTE — Telephone Encounter (Signed)
Spoke with patient regarding new patient appointment with GM 1/11 @ 2:30 pm for lab and 3pm for GM - date/time per GM. Demographic/insurance information confirmed. Per patient scanned insurance care - open access - no referral required.

## 2017-04-27 ENCOUNTER — Encounter: Payer: Self-pay | Admitting: Adult Health

## 2017-04-27 DIAGNOSIS — C50212 Malignant neoplasm of upper-inner quadrant of left female breast: Secondary | ICD-10-CM | POA: Insufficient documentation

## 2017-04-27 DIAGNOSIS — Z170 Estrogen receptor positive status [ER+]: Secondary | ICD-10-CM | POA: Insufficient documentation

## 2017-04-28 ENCOUNTER — Ambulatory Visit
Admission: RE | Admit: 2017-04-28 | Discharge: 2017-04-28 | Disposition: A | Discharge status: Home / Self Care | Source: Ambulatory Visit | Attending: Surgery | Admitting: Surgery

## 2017-04-28 ENCOUNTER — Other Ambulatory Visit: Payer: Self-pay

## 2017-04-28 DIAGNOSIS — Z170 Estrogen receptor positive status [ER+]: Principal | ICD-10-CM

## 2017-04-28 DIAGNOSIS — C50412 Malignant neoplasm of upper-outer quadrant of left female breast: Secondary | ICD-10-CM

## 2017-04-28 DIAGNOSIS — C50912 Malignant neoplasm of unspecified site of left female breast: Secondary | ICD-10-CM

## 2017-04-28 DIAGNOSIS — I700 Atherosclerosis of aorta: Secondary | ICD-10-CM | POA: Insufficient documentation

## 2017-04-28 MED ORDER — GADOBENATE DIMEGLUMINE 529 MG/ML IV SOLN
14.0000 mL | Freq: Once | INTRAVENOUS | Status: AC | PRN
  Administered 2017-04-28: 14 mL via INTRAVENOUS

## 2017-04-28 NOTE — Progress Notes (Addendum)
[place]  [phone] [phone]) [phone]     ID: KYANNE RIALS DOB: 17-Jan-1961  MR#: [id number]  ASN#:[id number]  Patient Care Team: [name], MD as PCP - General (Unknown Physician Specialty) Angell Pincock, Virgie Dad, MD as Consulting Physician (Oncology) Erroll Luna, MD as Consulting Physician (General Surgery) Dermatology, Marta Antu, MD as Referring Physician (Obstetrics and Gynecology) OTHER MD:  CHIEF COMPLAINT: Estrogen receptor positive breast cancer  CURRENT TREATMENT: Awaiting definitive surgery   HISTORY OF CURRENT ILLNESS: The patient underwent bilateral screening mammography with tomography at the breast center 03/29/2017.  There were some calcifications associated with possible architectural distortion in the left breast.  She was recalled for left unilateral diagnostic mammography with ultrasonography 04/04/2017.  This found the breast density to be category C.  In the upper outer left breast there was a 1.2 cm group of coarse calcifications with persistent distortion.  This was not palpable on exam.  Ultrasound showed a 1.0 cm irregular mass in the left breast at the 11 o'clock position 3 cm from the nipple and also a 1.5 area of posterior acoustic shadowing at the 12 o'clock position 4 cm from the nipple.  The latter was felt to correspond to the area of suspicious calcifications.  Accordingly on 04/08/2017 the patient underwent biopsy of the 2 areas in question. 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  Both biopsy site appeared identical.  Only one prognostic panel was sent, from the 11:00 biopsy, showing estrogen epidural 100% positive, estrogen receptor 100% positive, both with strong staining intensity, with an MIB-105%, and no HER-2 amplification, the signals  ratio being 1.35 and the number per cell 2.10.  She underwent breast MRI 04/28/2017 showing, in the left breast, an area of mass and non-masslike enhancement overall measuring up to 4 cm.  In addition there were 3 separate lesions in the right breast suspicious for malignancy, requiring additional biopsy.   The patient's subsequent history is as detailed below.  INTERVAL HISTORY: Onyinyechi was evaluated in the breast clinic 04/29/2017 accompanied by her husband [name]. Her case was also presented at the multidisciplinary breast cancer conference 04/27/2017.Marland Kitchen At that time a preliminary plan was proposed: Breast conserving surgery with sentinel lymph node sampling, followed by radiation, then antiestrogens.  Genetics was to be considered.  REVIEW OF SYSTEMS: Kennesha is doing well overall.  She admits to some exposure to chemicals at work.  She exercises at the local gym irregularly.  She denies unusual headaches, visual changes, nausea, vomiting, or dizziness. There has been no unusual cough, phlegm production, or pleurisy. This been no change in bowel or bladder habits. She denies unexplained fatigue or unexplained weight loss, bleeding, rash, or fever. A detailed review of systems was otherwise entirely stable.    PAST MEDICAL HISTORY: Past Medical History:  Diagnosis Date  . Abnormal CAT scan 2013  . GERD (gastroesophageal reflux disease)   . Hepatitis C 2001   Followed by Dr. Charlean Sanfilippo at [place]: Past Surgical History:  Procedure Laterality Date  . BREAST LUMPECTOMY    . COLONOSCOPY  2011  . FLEXIBLE SIGMOIDOSCOPY  February 12, 2013   have normal perianal exam, question focal prolapse. 2 small scars in the rectum.  Marland Kitchen HEMORRHOID BANDING  2012   3 times over three months  . SIGMOIDOSCOPY  2013  .  UPPER GI ENDOSCOPY  2013    FAMILY HISTORY Family History  Problem Relation Age of Onset  . Hypertension Mother   . Thyroid disease Mother        Multi problems  .  Coronary artery disease Father   . Heart failure Father   . Atrial fibrillation Father   . Diabetes Brother    Both of the patient's parents are living. Her father is 21 years old and her mother is 56 years old as of January 2019. She has three brothers and no sisters. Her maternal great aunt had breast cancer when she was after 37 and her other maternal great aunt had pancreatic cancer. She has no family history of ovarian cancer. The patient's father had prostate cancer in his 92's.   GYNECOLOGIC HISTORY:  No LMP recorded. Patient has had a hysterectomy.  Menarche: 57 years old GP: She never carried a pregnancy to term LMP: Hysterectomy about 10 years ago, no salpingo-oophorectomy HRT: She was on Estradiol on and off the last three years. 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 She uses gloves when using colorings and other chemicals. Her husband [name] is retired and was in hosiery. They have one dog. She is not a Banker.     ADVANCED DIRECTIVES: Not in place   HEALTH MAINTENANCE: Social History   Tobacco Use  . Smoking status: Former Smoker    Packs/day: 1.50    Years: 20.00    Pack years: 30.00    Types: Cigarettes  . Smokeless tobacco: Never Used  . Tobacco comment: quit smoking in 2000  Substance Use Topics  . Alcohol use: No  . Drug use: No     Colonoscopy: [facility] in [place]  PAP: Status post hysterectomy  Bone density: at 57 --does not know results   Allergies  Allergen Reactions  . Propoxyphene Rash    Uncoded Allergy. Allergen: mellaril, Other Reaction: Other reaction, double vision Uncoded Allergy. Allergen: mellaril, Other Reaction: Other reaction, double vision Uncoded Allergy. Allergen: mellaril, Other Reaction: Other reaction, double vision  . Thioridazine Hcl     REACTION: Reaction not known  . Bupropion Rash and Other (See Comments)    Insomnia Insomnia  . Citalopram Rash and Other (See Comments)     Lightheaded Lightheaded  . Duloxetine Rash and Other (See Comments)    Insomnia Insomnia  . Thioridazine Rash    REACTION: Reaction not known REACTION: Reaction not known  . Venlafaxine Rash and Other (See Comments)    Tolerated once, but then another time it caused malaise. Tolerated once, but then another time it caused malaise.    Current Outpatient Medications  Medication Sig Dispense Refill  . LORazepam (ATIVAN) 0.5 MG tablet Take 1 mg by mouth as needed.     Marland Kitchen omeprazole (PRILOSEC) 40 MG capsule Take 40 mg by mouth daily.    . tamoxifen (NOLVADEX) 20 MG tablet Take 1 tablet (20 mg total) by mouth daily. 90 tablet 12  . zolpidem (AMBIEN) 5 MG tablet Take by mouth as needed.      Current Facility-Administered Medications  Medication Dose Route Frequency Provider Last Rate Last Dose  . cetirizine (ZYRTEC) tablet 10 mg  10 mg Oral QHS [name], MD        OBJECTIVE: L aged white woman who appears well  Vitals:   04/29/17 1407  BP: (!) 136/92  Pulse: 97  Resp: 20  Temp: 97.6  F (36.4 C)  SpO2: 100%     Body mass index is 23.43 kg/m.   Wt Readings from Last 3 Encounters:  04/29/17 149 lb 9.6 oz (67.9 kg)  09/21/16 148 lb (67.1 kg)  02/26/16 146 lb (66.2 kg)      ECOG FS:0 - Asymptomatic  Ocular: Sclerae unicteric, pupils round and equal Ear-nose-throat: Oropharynx clear and moist Lymphatic: No cervical or supraclavicular adenopathy Lungs no rales or rhonchi Heart regular rate and rhythm Abd soft, nontender, positive bowel sounds MSK no focal spinal tenderness, no joint edema Neuro: non-focal, well-oriented, appropriate affect Breasts: I do not palpate a mass in either breast.  Both axillae are benign.   LAB RESULTS:  CMP     Component Value Date/Time   NA 142 04/29/2017 1348   K 3.5 04/29/2017 1348   CL 107 04/29/2017 1348   CO2 24 04/29/2017 1348   GLUCOSE 131 04/29/2017 1348   BUN 15 04/29/2017 1348   CREATININE 0.74 12/23/2011 2115    CALCIUM 9.5 04/29/2017 1348   PROT 7.7 04/29/2017 1348   ALBUMIN 4.2 04/29/2017 1348   AST 15 04/29/2017 1348   ALT 14 04/29/2017 1348   ALKPHOS 70 04/29/2017 1348   BILITOT 0.7 04/29/2017 1348   GFRNONAA >90 12/23/2011 2115   GFRAA >90 12/23/2011 2115    No results found for: TOTALPROTELP, ALBUMINELP, A1GS, A2GS, BETS, BETA2SER, GAMS, MSPIKE, SPEI  No results found for: KPAFRELGTCHN, LAMBDASER, KAPLAMBRATIO  Lab Results  Component Value Date   WBC 6.5 12/23/2011   NEUTROABS 2.6 04/29/2017   HGB 13.2 12/23/2011   HCT 41.9 04/29/2017   MCV 92.3 04/29/2017   PLT 168 12/23/2011    _0 @  No results found for: LABCA2  No components found for: AOZHYQ657  No results for input(s): INR in the last 168 hours.  No results found for: LABCA2  No results found for: QIO962  No results found for: XBM841  No results found for: LKG401  No results found for: CA2729  No components found for: HGQUANT  No results found for: CEA1 / No results found for: CEA1   No results found for: AFPTUMOR  No results found for: CHROMOGRNA  No results found for: PSA1  Appointment on 04/29/2017  Component Date Value Ref Range Status  . WBC Count 04/29/2017 4.7  3.9 - 10.3 K/uL Final  . RBC 04/29/2017 4.54  3.70 - 5.45 MIL/uL Final  . Hemoglobin 04/29/2017 14.2  11.6 - 15.9 g/dL Final  . HCT 04/29/2017 41.9  34.8 - 46.6 % Final  . MCV 04/29/2017 92.3  79.5 - 101.0 fL Final  . MCH 04/29/2017 31.3  25.1 - 34.0 pg Final  . MCHC 04/29/2017 33.9  31.5 - 36.0 g/dL Final  . RDW 04/29/2017 13.3  11.2 - 16.1 % Final  . Platelet Count 04/29/2017 171  145 - 400 K/uL Final  . Neutrophils Relative % 04/29/2017 56  % Final  . Neutro Abs 04/29/2017 2.6  1.5 - 6.5 K/uL Final  . Lymphocytes Relative 04/29/2017 37  % Final  . Lymphs Abs 04/29/2017 1.7  0.9 - 3.3 K/uL Final  . Monocytes Relative 04/29/2017 6  % Final  . Monocytes Absolute 04/29/2017 0.3  0.1 - 0.9 K/uL Final  . Eosinophils  Relative 04/29/2017 1  % Final  . Eosinophils Absolute 04/29/2017 0.1  0.0 - 0.5 K/uL Final  . Basophils Relative 04/29/2017 0  % Final  . Basophils Absolute 04/29/2017 0.0  0.0 - 0.1 K/uL Final  Performed at [facility], [place] [address], [place], [place] [address]  . Sodium 04/29/2017 142  136 - 145 mmol/L Final  . Potassium 04/29/2017 3.5  3.3 - 4.7 mmol/L Final  . Chloride 04/29/2017 107  98 - 109 mmol/L Final  . CO2 04/29/2017 24  22 - 29 mmol/L Final  . Glucose, Bld 04/29/2017 131  70 - 140 mg/dL Final  . BUN 04/29/2017 15  7 - 26 mg/dL Final  . Creatinine 04/29/2017 0.91  0.60 - 1.10 mg/dL Final  . Calcium 04/29/2017 9.5  8.4 - 10.4 mg/dL Final  . Total Protein 04/29/2017 7.7  6.4 - 8.3 g/dL Final  . Albumin 04/29/2017 4.2  3.5 - 5.0 g/dL Final  . AST 04/29/2017 15  5 - 34 U/L Final  . ALT 04/29/2017 14  0 - 55 U/L Final  . Alkaline Phosphatase 04/29/2017 70  40 - 150 U/L Final  . Total Bilirubin 04/29/2017 0.7  0.2 - 1.2 mg/dL Final  . GFR, Est Non Af Am 04/29/2017 >60  >60 mL/min Final  . GFR, Est AFR Am 04/29/2017 >60  >60 mL/min Final   Comment: (NOTE) The eGFR has been calculated using the CKD EPI equation. This calculation has not been validated in all clinical situations. eGFR's persistently <60 mL/min signify possible Chronic Kidney Disease.   Georgiann Hahn gap 04/29/2017 11  3 - 11 Final   Performed at [facility], [address] [place], [place] [address]    (this displays the last labs from the last 3 days)  No results found for: TOTALPROTELP, ALBUMINELP, A1GS, A2GS, BETS, BETA2SER, GAMS, MSPIKE, SPEI (this displays SPEP labs)  No results found for: KPAFRELGTCHN, LAMBDASER, KAPLAMBRATIO (kappa/lambda light chains)  No results found for: HGBA, HGBA2QUANT, HGBFQUANT, HGBSQUAN (Hemoglobinopathy evaluation)   No results found for: LDH  No results found for: IRON, TIBC, IRONPCTSAT (Iron and TIBC)  No results  found for: FERRITIN  Urinalysis    Component Value Date/Time   COLORURINE YELLOW 08/17/2011 0902   APPEARANCEUR CLEAR 08/17/2011 0902   LABSPEC 1.027 08/17/2011 0902   PHURINE 5.0 08/17/2011 0902   GLUCOSEU NEGATIVE 08/17/2011 0902   HGBUR NEGATIVE 08/17/2011 0902   BILIRUBINUR NEGATIVE 08/17/2011 0902   KETONESUR NEGATIVE 08/17/2011 0902   PROTEINUR NEGATIVE 08/17/2011 0902   UROBILINOGEN 0.2 08/17/2011 0902   NITRITE NEGATIVE 08/17/2011 0902   LEUKOCYTESUR SMALL (A) 08/17/2011 0902     STUDIES: Mr Breast Bilateral [place] Cad  Addendum Date: 04/29/2017   ADDENDUM REPORT: 04/29/2017 13:08 ADDENDUM: Error in report: Both in the Impression and the Recommendation sections, I mistakenly stated that ultrasound-guided biopsies are recommended. These should say "MRI guided biopsies are recommended". Specifically, Impression #1 should say ".MRI guided biopsies are recommended for these 2 masses". Recommendation #1 should say "MRI guided biopsies of the 2 suspicious enhancing masses within the right breast, upper inner quadrant and lower outer quadrant, both at middle depth, measuring 7 mm and 8 mm respectively (series 6, images 65 and 101 respectively)". Electronically Signed   By: Franki Cabot M.D.   On: 04/29/2017 13:08   Result Date: 04/29/2017 CLINICAL DATA:  Recently diagnosed left breast cancer at 2 sites of the upper left breast, 11 o'clock and 12 o'clock axes. History of right breast lumpectomy 7 years ago. Additional history of previous excisional biopsy of a high risk lesion (complex sclerosing lesion) within the lower outer quadrant of the left breast in 2011. LABS:  Not applicable EXAM: BILATERAL BREAST MRI WITH AND WITHOUT CONTRAST TECHNIQUE: Multiplanar, multisequence MR images of both breasts were obtained prior to and following the intravenous administration of 14 ml of MultiHance. THREE-DIMENSIONAL MR IMAGE RENDERING ON INDEPENDENT WORKSTATION: Three-dimensional MR  images were rendered by post-processing of the original MR data on an independent workstation. The three-dimensional MR images were interpreted, and findings are reported in the following complete MRI report for this study. Three dimensional images were evaluated at the independent DynaCad workstation COMPARISON:  Previous exams including diagnostic mammogram and ultrasound dated 04/04/2017. FINDINGS: Breast composition: b. Scattered fibroglandular tissue. Background parenchymal enhancement: Mild Right breast: Suspicious enhancing mass within the upper inner quadrant of the right breast, at middle depth, measuring 7 mm, T2 hyperintense, with mixed enhancement kinetics including suspicious washout (series 6, image 65). Additional similar suspicious enhancing mass within the lower outer quadrant of the right breast, at middle depth, measuring 8 mm, T2 hyperintense, with persistent enhancement kinetics (series 6, image 101). Additional similar-appearing mass within the lower inner quadrant of the right breast, at anterior depth, measuring 5 mm, T2 hyperintense, with sub threshold enhancement kinetics (also series 6, image 101). No additional enhancing masses, non mass enhancement or secondary signs of malignancy are identified within the right breast. Left breast: Contiguous mass and non mass enhancement from the upper central left breast (11 o'clock axis) extending superior laterally to the upper-outer quadrant (12-1 o'clock axis), consistent with patient's recent biopsy-proven carcinomas, overall measuring 4 cm craniocaudal dimension and up to 1.5 cm greatest transverse dimension (series 6, images 48 through 70). Associated biopsy clips are well-positioned at the most superior and inferior aspects of this mass and non mass enhancement. There are no additional suspicious enhancing masses, non mass enhancement or secondary signs of malignancy within the left breast. Lymph nodes: No abnormal appearing lymph nodes.  Ancillary findings:  None. IMPRESSION: 1. Two suspicious enhancing masses within the RIGHT breast, upper inner quadrant and lower outer quadrant, measuring 7 mm and 8 mm respectively, suspicious for contralateral disease. Locations/image numbers detailed above. Ultrasound-guided biopsies are recommended for these 2 masses. 2. Additional enhancing mass within the right breast, lower inner quadrant, at anterior depth, measuring 5 mm. This mass demonstrates sub threshold kinetics but is otherwise similar in appearance to the 2 additional masses within the right breast. 3. Biopsy-proven malignancy within the LEFT breast. The combination of mass and non mass enhancement measures 4 cm craniocaudal dimension and 1.5 cm greatest transverse dimension. Biopsy clips are well-positioned (bracketed) at the most superior and inferior aspects of this mass and non mass enhancement corresponding to the sites of biopsy-proven malignancy. 4. No evidence of additional multifocal or multicentric disease within the left breast. 5. No evidence of metastatic lymphadenopathy. RECOMMENDATION: 1. Ultrasound-guided biopsies of the 2 suspicious enhancing masses within the right breast, upper inner quadrant and lower outer quadrant, both at middle depth, measuring 7 mm and 8 mm respectively (series 6, images 65 and 101 respectively). 2. If pathology results for the 2 MRI guided biopsies are both benign, the third similar appearing lesion within the anterior right breast can be also considered benign. If pathology results for 1 or both MRI guided biopsies are positive for malignancy, recommend second look targeted ultrasound and possible ultrasound-guided biopsy for this third lesion in the right breast (this lesion does not appear amenable to MRI guided biopsy due to its anterior/superficial location). BI-RADS CATEGORY  4: Suspicious. Electronically Signed: By: Franki Cabot M.D. On: 04/29/2017 11:28  US Breast Ltd Uni Left Inc  Axilla  Result Date: 04/04/2017 CLINICAL DATA:  Patient recalled from screening for left breast mass and calcifications. EXAM: 2D DIGITAL DIAGNOSTIC LEFT MAMMOGRAM WITH CAD AND ADJUNCT TOMO ULTRASOUND LEFT BREAST COMPARISON:  Previous exam(s). ACR Breast Density Category c: The breast tissue is heterogeneously dense, which may obscure small masses. FINDINGS: Spot compression CC and full paddle true lateral tomosynthesis images of the left breast were obtained. Additional magnification CC and true lateral views of the left breast obtained. Within the upper-outer left breast middle depth there is a 1.2 cm group of coarse heterogeneous calcifications, further evaluated with magnification views. There is persistent distortion demonstrated at this location on spot compression mammography. Mammographic images were processed with CAD. On physical exam, I palpate no discrete mass within the superior and superolateral left breast. Targeted ultrasound is performed, showing a 6 x 8 x 10 mm irregular hypoechoic mass left breast 11 o'clock position 3 cm from the nipple. Within the left breast 12 o'clock position 4 cm from nipple there is a 1.5 x 1.0 x 1.3 cm focal area of posterior acoustic shadowing, felt to correspond with the area of distortion and calcifications on mammography. IMPRESSION: 1. Persistent focal distortion and calcifications within the superior slightly lateral left breast with sonographic correlate. In order to sample both the calcifications and distortion, recommend biopsy of this area with stereotactic guidance. 2. Indeterminate irregular left breast mass 11 o'clock position. RECOMMENDATION: 1. Stereotactic guided core needle biopsy focal distortion and calcifications within the upper-outer left breast. 2. Ultrasound-guided core needle biopsy left breast mass 11 o'clock position. I have discussed the findings and recommendations with the patient. Results were also provided in writing at the conclusion of  the visit. If applicable, a reminder letter will be sent to the patient regarding the next appointment. BI-RADS CATEGORY  4: Suspicious. Electronically Signed   By: Lovey Newcomer M.D.   On: 04/04/2017 12:04   Mm Diag Breast Tomo Uni Left  Result Date: 04/04/2017 CLINICAL DATA:  Patient recalled from screening for left breast mass and calcifications. EXAM: 2D DIGITAL DIAGNOSTIC LEFT MAMMOGRAM WITH CAD AND ADJUNCT TOMO ULTRASOUND LEFT BREAST COMPARISON:  Previous exam(s). ACR Breast Density Category c: The breast tissue is heterogeneously dense, which may obscure small masses. FINDINGS: Spot compression CC and full paddle true lateral tomosynthesis images of the left breast were obtained. Additional magnification CC and true lateral views of the left breast obtained. Within the upper-outer left breast middle depth there is a 1.2 cm group of coarse heterogeneous calcifications, further evaluated with magnification views. There is persistent distortion demonstrated at this location on spot compression mammography. Mammographic images were processed with CAD. On physical exam, I palpate no discrete mass within the superior and superolateral left breast. Targeted ultrasound is performed, showing a 6 x 8 x 10 mm irregular hypoechoic mass left breast 11 o'clock position 3 cm from the nipple. Within the left breast 12 o'clock position 4 cm from nipple there is a 1.5 x 1.0 x 1.3 cm focal area of posterior acoustic shadowing, felt to correspond with the area of distortion and calcifications on mammography. IMPRESSION: 1. Persistent focal distortion and calcifications within the superior slightly lateral left breast with sonographic correlate. In order to sample both the calcifications and distortion, recommend biopsy of this area with stereotactic guidance. 2. Indeterminate irregular left breast mass 11 o'clock position. RECOMMENDATION: 1. Stereotactic guided core needle biopsy focal distortion and calcifications within the  upper-outer left breast. 2.  Ultrasound-guided core needle biopsy left breast mass 11 o'clock position. I have discussed the findings and recommendations with the patient. Results were also provided in writing at the conclusion of the visit. If applicable, a reminder letter will be sent to the patient regarding the next appointment. BI-RADS CATEGORY  4: Suspicious. Electronically Signed   By: Lovey Newcomer M.D.   On: 04/04/2017 12:04   [place] Axilla Left  Result Date: 04/18/2017 CLINICAL DATA:  Evaluate left axillary nodes. Known left-sided breast cancer. EXAM: ULTRASOUND OF THE LEFT AXILLA COMPARISON:  None FINDINGS: On physical exam,no suspicious lumps are identified. Ultrasound is performed, showing no abnormal lymph nodes in the left axilla. IMPRESSION: No left axillary adenopathy. RECOMMENDATION: Continued surgical and oncologic follow up. I have discussed the findings and recommendations with the patient. Results were also provided in writing at the conclusion of the visit. If applicable, a reminder letter will be sent to the patient regarding the next appointment. BI-RADS CATEGORY  2: Benign. Electronically Signed   By: Dorise Bullion III M.D   On: 04/18/2017 10:57   Mm Clip Placement Left  Result Date: 04/08/2017 CLINICAL DATA:  57 year old female status post stereotactic and ultrasound-guided biopsies. EXAM: DIAGNOSTIC LEFT MAMMOGRAM POST ULTRASOUND all caps BIOPSY COMPARISON:  Previous exam(s). FINDINGS: Mammographic images were obtained following stereotactic guided biopsy of of the upper-outer left breast ultrasound-guided biopsy of the upper inner left breast. A coil shaped clip is identified in the upper outer quadrant and a ribbon shaped clip is identified in the upper inner quadrant. Both are in the expected location status post biopsy. IMPRESSION: Two post biopsy clips in the expected locations status post stereotactic and ultrasound-guided biopsy. Final Assessment: Post Procedure Mammograms for  Marker Placement Electronically Signed   By: Kristopher Oppenheim M.D.   On: 04/08/2017 10:07   Mm Lt Breast Bx W Loc Dev 1st Lesion Image Bx Spec Stereo Guide  Addendum Date: 04/15/2017   ADDENDUM REPORT: 04/15/2017 07:18 ADDENDUM: Pathology revealed INVASIVE DUCTAL CARCINOMA, LOW GRADE, ARISING IN A BACKGROUND OF DUCTAL CARCINOMA IN SITU of the Left breast, both locations, 12:00 o'clock, 4cmfn and 11:00 o'clock 3cmfn. This was found to be concordant by Dr. Kristopher Oppenheim. Pathology results were discussed with the patient by telephone. The patient reported doing well after the biopsies with tenderness at the sites. Post biopsy instructions and care were reviewed and questions were answered. The patient was encouraged to call [facility] for any additional concerns. Surgical consultation has been arranged with Dr. Erroll [facility] at [facility], per patient request, on April 20, 2017. The patient is scheduled for a left axillary ultrasound on April 18, 2017. Additional evaluation with bilateral breast MRI should be considered to evaluate extent of disease given the patient's breast density. Pathology results reported by Terie Purser, RN on 04/15/2017. Electronically Signed   By: Kristopher Oppenheim M.D.   On: 04/15/2017 07:18   Result Date: 04/15/2017 CLINICAL DATA:  57 year old female with focal distortion calcifications in the upper-outer left breast and hypoechoic mass in the upper inner left breast. EXAM: LEFT BREAST STEREOTACTIC CORE NEEDLE BIOPSY LEFT BREAST ULTRASOUND CORE NEEDLE BIOPSY COMPARISON:  Previous exams. FINDINGS: The patient and I discussed the procedure of stereotactic-guided biopsy including benefits and alternatives. We discussed the high likelihood of a successful procedure. We discussed the risks of the procedure including infection, bleeding, tissue injury, clip migration, and inadequate sampling. Informed written consent was given. The usual time out  protocol was performed  immediately prior to the procedure. Using sterile technique and 1% Lidocaine as local anesthetic, under stereotactic guidance, a 9 gauge vacuum assisted device was used to perform core needle biopsy of calcifications/ distortion in the upper-outer quadrant of the left breast using a superior approach. Specimen radiograph was performed showing calcifications in several specimen. Specimens with calcifications are identified for pathology. Lesion quadrant: Upper-outer quadrant At the conclusion of the procedure, a coil shaped tissue marker clip was deployed into the biopsy cavity. Using sterile technique and 1% Lidocaine as local anesthetic, under direct ultrasound visualization, a 12 gauge spring-loaded device was used to perform biopsy of a left breast mass at the 11 o'clock position using a medial approach. At the conclusion of the procedure a ribbon shaped tissue marker clip was deployed into the biopsy cavity. Lesion quadrant: Upper inner quadrant Follow up 2 view mammogram was performed and dictated separately. IMPRESSION: Stereotactic-guided biopsy of left breast distortion/ calcifications and ultrasound-guided biopsy of a left breast mass. No apparent complications. Electronically Signed: By: Kristopher Oppenheim M.D. 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 This was found to be concordant by Dr. Kristopher Oppenheim. Pathology results were discussed with the patient by telephone. The patient reported doing well after the biopsies with tenderness at the sites. Post biopsy instructions and care were reviewed and questions were answered. The patient was encouraged to call [facility] for any additional concerns. Surgical consultation has been arranged with Dr. Erroll Luna at [facility], per patient request, on April 20, 2017. The patient is scheduled for a left axillary ultrasound on April 18, 2017. Additional evaluation with bilateral breast MRI should be considered to evaluate extent of disease given the patient's breast density. Pathology results reported by Terie Purser, RN on 04/15/2017. Electronically Signed   By: Kristopher Oppenheim M.D.   On: 04/15/2017 07:18   Result Date: 04/15/2017 CLINICAL DATA:  57 year old female with focal distortion calcifications in the upper-outer left breast and hypoechoic mass in the upper inner left breast. EXAM: LEFT BREAST STEREOTACTIC CORE NEEDLE BIOPSY LEFT BREAST ULTRASOUND CORE NEEDLE BIOPSY COMPARISON:  Previous exams. FINDINGS: The patient and I discussed the procedure of stereotactic-guided biopsy including benefits and alternatives. We discussed the high likelihood of a successful procedure. We discussed the risks of the procedure including infection, bleeding, tissue injury, clip migration, and inadequate sampling. Informed written consent was given. The usual time out protocol was performed immediately prior to the procedure. Using sterile technique and 1% Lidocaine as local anesthetic, under stereotactic guidance, a 9 gauge vacuum assisted device was used to perform core needle biopsy of calcifications/ distortion in the upper-outer quadrant of the left breast using a superior approach. Specimen radiograph was performed showing calcifications in several specimen. Specimens with calcifications are identified for pathology. Lesion quadrant: Upper-outer quadrant At the conclusion of the procedure, a coil shaped tissue marker clip was deployed into the biopsy cavity. Using sterile technique and 1% Lidocaine as local anesthetic, under direct ultrasound visualization, a 12 gauge spring-loaded device was used to  perform biopsy of a left breast mass at the 11 o'clock position using a medial approach. At the conclusion of the procedure a ribbon  shaped tissue marker clip was deployed into the biopsy cavity. Lesion quadrant: Upper inner quadrant Follow up 2 view mammogram was performed and dictated separately. IMPRESSION: Stereotactic-guided biopsy of left breast distortion/ calcifications and ultrasound-guided biopsy of a left breast mass. No apparent complications. Electronically Signed: By: Kristopher Oppenheim M.D. On: 04/08/2017 10:08    ELIGIBLE FOR AVAILABLE RESEARCH PROTOCOL: no  ASSESSMENT: 57 y.o. 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 We first reviewed the fact that cancer is not one disease but more than 100 different diseases and that it is important to keep them separate-- otherwise when friends and relatives discuss their own cancer experiences with [place] confusion can result. Similarly we explained that if breast cancer spreads to the bone or liver, the patient would not have bone cancer or liver cancer, but breast cancer in the bone and breast cancer in the liver: one cancer in three places-- not 3 different cancers which otherwise would have to be treated in 3 different  ways.  We discussed the difference between local and systemic therapy. In terms of loco-regional treatment, lumpectomy plus radiation is equivalent to mastectomy as far as survival is concerned. For this reason, and because the cosmetic results are generally superior, we recommend breast conserving surgery.  However, she will require additional biopsies on the right side and her surgeon will have to evaluate the extent of disease on the left before a definitive decision can be made regarding her best surgical option.  The issue of course is not survival but cosmesis  We also noted that in terms of sequencing of treatments, whether systemic therapy or surgery is done first does not affect the ultimate outcome.  In her case were going to start with tamoxifen so that if there is a surgical delay she will not put herself at increased risk of metastases.  We then discussed the rationale for systemic therapy. There is some risk that this cancer may have already spread to other parts of her body. Patients frequently ask at this point about bone scans, CAT scans and PET scans to find out if they have occult breast cancer somewhere else. The problem is that in early stage disease we are much more likely to find false positives then true cancers and this would expose the patient to unnecessary procedures as well as unnecessary radiation. Scans cannot answer the question the patient really would like to know, which is whether she has microscopic disease elsewhere in her body. For those reasons we do not recommend them.  Of course we would proceed to aggressive evaluation of any symptoms that might suggest metastatic disease, but that is not the case here.  Next we went over the options for systemic therapy which are anti-estrogens, anti-HER-2 immunotherapy, and chemotherapy. Felesha does not meet criteria for anti-HER-2 immunotherapy. She is a  good candidate for anti-estrogens.  The question of chemotherapy is more  complicated. Chemotherapy is most effective in rapidly growing, aggressive tumors. It is much less effective in low-grade, slow growing cancers, like Jaleigh 's. For that reason I do not anticipate she will need or benefit from chemotherapy.  If her invasive tumor proved to be greater than 5 mm, we are going to request an Oncotype from the definitive surgical sample, as suggested by NCCN guidelines. That will help [place] make a definitive decision regarding chemotherapy in this case.  She understands however that I do not anticipate her needing chemotherapy  The plan then is to start tamoxifen now--we discussed the possible toxicity side effects and complications in detail--proceed to additional right breast biopsies, meet with surgery and likely also plastic surgery to determine the best cosmetic outcome, and then proceed to surgery most likely June 14, 2016, after the patient returns from a cruise 2 days before.  She will return to see me a week or so after her definitive surgery to discuss the final pathology and further plans.  Eliani has a good understanding of the overall plan. She agrees with it. She knows the goal of treatment in her case is cure. She will call with any problems that may develop before her next visit here.  Katiria Calame, Virgie Dad, MD  04/29/17 3:51 PM Medical Oncology and Hematology [facility] [address] [place], [place] [address] Tel. [phone]    Fax. [phone]  This document serves as a record of services personally performed by Chauncey Cruel, MD. It was created on his behalf by Margit Banda, a trained medical scribe. The creation of this record is based on the scribe's personal observations and the provider's statements to them.   I have reviewed the above documentation for accuracy and completeness, and I agree with the above.   ADDENDUM: I checked with Dr. Enriqueta Shutter and he tells me the lesions in the right breast are less than a  centimeter and therefore very unlikely to be seen by ultrasound.  Her general protocol is to proceed directly to MRI biopsy of these lesions.  I also checked with our genetics counselor.  She tells me the patient will qualify for genetics testing.  I am requesting that appointment.

## 2017-04-29 ENCOUNTER — Inpatient Hospital Stay

## 2017-04-29 ENCOUNTER — Telehealth: Payer: Self-pay | Admitting: Oncology

## 2017-04-29 ENCOUNTER — Inpatient Hospital Stay: Attending: Oncology | Admitting: Oncology

## 2017-04-29 ENCOUNTER — Encounter: Payer: Self-pay | Admitting: Adult Health

## 2017-04-29 VITALS — BP 136/92 | HR 97 | Temp 97.6°F | Resp 20 | Ht 67.0 in | Wt 149.6 lb

## 2017-04-29 DIAGNOSIS — Z170 Estrogen receptor positive status [ER+]: Secondary | ICD-10-CM | POA: Diagnosis not present

## 2017-04-29 DIAGNOSIS — C50412 Malignant neoplasm of upper-outer quadrant of left female breast: Secondary | ICD-10-CM | POA: Insufficient documentation

## 2017-04-29 DIAGNOSIS — I700 Atherosclerosis of aorta: Secondary | ICD-10-CM

## 2017-04-29 DIAGNOSIS — B182 Chronic viral hepatitis C: Secondary | ICD-10-CM

## 2017-04-29 LAB — CMP (CANCER CENTER ONLY)
ALT: 14 U/L (ref 0–55)
AST: 15 U/L (ref 5–34)
Albumin: 4.2 g/dL (ref 3.5–5.0)
Alkaline Phosphatase: 70 U/L (ref 40–150)
Anion gap: 11 (ref 3–11)
BUN: 15 mg/dL (ref 7–26)
CO2: 24 mmol/L (ref 22–29)
Calcium: 9.5 mg/dL (ref 8.4–10.4)
Chloride: 107 mmol/L (ref 98–109)
Creatinine: 0.91 mg/dL (ref 0.60–1.10)
GFR, Est AFR Am: >60 mL/min (ref >60)
GFR, Estimated: >60 mL/min (ref >60)
Glucose, Bld: 131 mg/dL (ref 70–140)
Potassium: 3.5 mmol/L (ref 3.3–4.7)
Sodium: 142 mmol/L (ref 136–145)
Total Bilirubin: 0.7 mg/dL (ref 0.2–1.2)
Total Protein: 7.7 g/dL (ref 6.4–8.3)

## 2017-04-29 LAB — CBC WITH DIFFERENTIAL (CANCER CENTER ONLY)
Basophils Absolute: 0 10*3/uL (ref 0.0–0.1)
Basophils Relative: 0 %
Eosinophils Absolute: 0.1 10*3/uL (ref 0.0–0.5)
Eosinophils Relative: 1 %
HCT: 41.9 % (ref 34.8–46.6)
Hemoglobin: 14.2 g/dL (ref 11.6–15.9)
Lymphocytes Relative: 37 %
Lymphs Abs: 1.7 10*3/uL (ref 0.9–3.3)
MCH: 31.3 pg (ref 25.1–34.0)
MCHC: 33.9 g/dL (ref 31.5–36.0)
MCV: 92.3 fL (ref 79.5–101.0)
Monocytes Absolute: 0.3 10*3/uL (ref 0.1–0.9)
Monocytes Relative: 6 %
Neutro Abs: 2.6 10*3/uL (ref 1.5–6.5)
Neutrophils Relative %: 56 %
Platelet Count: 171 10*3/uL (ref 145–400)
RBC: 4.54 MIL/uL (ref 3.70–5.45)
RDW: 13.3 % (ref 11.2–16.1)
WBC Count: 4.7 10*3/uL (ref 3.9–10.3)

## 2017-04-29 MED ORDER — TAMOXIFEN CITRATE 20 MG PO TABS: 20.0000 mg | ORAL_TABLET | Freq: Every day | ORAL | 12 refills | Status: AC

## 2017-04-29 NOTE — Addendum Note (Signed)
Addended by: Chauncey Cruel on: 04/29/2017 04:27 PM   Modules accepted: Orders

## 2017-04-29 NOTE — Telephone Encounter (Signed)
Gave patient AVs and calendar of upcoming March appointments. °

## 2017-05-01 ENCOUNTER — Encounter: Payer: Self-pay | Admitting: Oncology

## 2017-05-02 ENCOUNTER — Telehealth: Payer: Self-pay | Admitting: Oncology

## 2017-05-02 NOTE — Telephone Encounter (Signed)
Patient should be contacted regarding MRI biopsies. Scheduled in next available genetic counseling appts -sending confirmation letter in the mail.

## 2017-05-02 NOTE — Progress Notes (Signed)
Location of Breast Cancer: Left Breast  Histology per Pathology Report: 04/08/17 Diagnosis 1. 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 - SEE COMMENT. 2. 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 - SEE COMMENT  2. Receptor Status: ER(100%), PR (100%), Her2-neu (NEG), Ki-(5%)  Did patient present with symptoms or was this found on screening mammography?: It was found on a screening mammogram.   Past/Anticipated interventions by surgeon, if any: Dr. Brantley Stage- ? Surgery 06/14/17 per Dr. Virgie Dad note on 04/29/17 after a planned cruise.   Past/Anticipated interventions by medical oncology, if any: Dr. Jana Hakim 04/29/17 (1) definitive surgery pending (2) not likely to benefit from chemotherapy: Consider Oncotype if invasive component proves to be greater than 5 mm (3) adjuvant radiation after lumpectomy (4) consider genetics testing (5) tamoxifen started neoadjuvantly 04/29/2017 given possible surgical delays   Lymphedema issues, if any:  N/A  Pain issues, if any:  She denies  SAFETY ISSUES:  Prior radiation? No  Pacemaker/ICD? No  Possible current pregnancy? No  Is the patient on methotrexate? No  Current Complaints / other details:   MRI bilateral breasts 04/29/17. She has a scheduled biopsy tomorrow morning of her right breast.   BP (!) 133/95   Pulse 93   Temp 97.8 F (36.6 C)   Ht '5\' 7"'$  (1.702 m)   Wt 149 lb (67.6 kg)   SpO2 100% Comment: room air  BMI 23.34 kg/m    Wt Readings from Last 3 Encounters:  05/04/17 149 lb (67.6 kg)  04/29/17 149 lb 9.6 oz (67.9 kg)  09/21/16 148 lb (67.1 kg)   Myleka Moncure, Stephani Police, RN 05/02/2017,1:39 PM

## 2017-05-03 ENCOUNTER — Encounter: Payer: Self-pay | Admitting: *Deleted

## 2017-05-03 ENCOUNTER — Other Ambulatory Visit: Payer: Self-pay | Admitting: Oncology

## 2017-05-03 DIAGNOSIS — I700 Atherosclerosis of aorta: Secondary | ICD-10-CM

## 2017-05-03 DIAGNOSIS — B182 Chronic viral hepatitis C: Secondary | ICD-10-CM

## 2017-05-03 DIAGNOSIS — C50412 Malignant neoplasm of upper-outer quadrant of left female breast: Secondary | ICD-10-CM

## 2017-05-03 DIAGNOSIS — Z170 Estrogen receptor positive status [ER+]: Principal | ICD-10-CM

## 2017-05-03 NOTE — Progress Notes (Signed)
Courtney Romero called later to tell us that she has very stable lung nodules which have been followed for 2 years and have not changed and that sometimes she gets swollen glands in the back of her neck under the occipital bone.

## 2017-05-04 ENCOUNTER — Ambulatory Visit
Admission: RE | Admit: 2017-05-04 | Discharge: 2017-05-04 | Disposition: A | Discharge status: Home / Self Care | Source: Ambulatory Visit | Attending: Radiation Oncology | Admitting: Radiation Oncology

## 2017-05-04 ENCOUNTER — Encounter: Payer: Self-pay | Admitting: Radiation Oncology

## 2017-05-04 ENCOUNTER — Encounter: Payer: Self-pay | Admitting: General Practice

## 2017-05-04 VITALS — BP 133/95 | HR 93 | Temp 97.8°F | Ht 67.0 in | Wt 149.0 lb

## 2017-05-04 DIAGNOSIS — Z170 Estrogen receptor positive status [ER+]: Secondary | ICD-10-CM

## 2017-05-04 DIAGNOSIS — Z87891 Personal history of nicotine dependence: Secondary | ICD-10-CM | POA: Insufficient documentation

## 2017-05-04 DIAGNOSIS — C50212 Malignant neoplasm of upper-inner quadrant of left female breast: Secondary | ICD-10-CM | POA: Diagnosis not present

## 2017-05-04 DIAGNOSIS — Z9889 Other specified postprocedural states: Secondary | ICD-10-CM | POA: Diagnosis not present

## 2017-05-04 DIAGNOSIS — Z79899 Other long term (current) drug therapy: Secondary | ICD-10-CM | POA: Insufficient documentation

## 2017-05-04 DIAGNOSIS — Z803 Family history of malignant neoplasm of breast: Secondary | ICD-10-CM | POA: Diagnosis not present

## 2017-05-04 DIAGNOSIS — K219 Gastro-esophageal reflux disease without esophagitis: Secondary | ICD-10-CM | POA: Insufficient documentation

## 2017-05-04 NOTE — Progress Notes (Signed)
[facility] Psychosocial Distress Screening Clinical Social Work  Clinical Social Work was referred by distress screening protocol.  The patient scored a 7 on the Psychosocial Distress Thermometer which indicates moderate distress. Clinical Social Worker Edwyna Shell to assess for distress and other psychosocial needs. CSW and patient discussed common feeling and emotions when being diagnosed with cancer, and the importance of support during treatment. CSW informed patient of the support team and support services at [facility]. CSW provided contact information and encouraged patient to call with any questions or concerns.  Expressed feeling "overwhelmed" w multiple appointments in the start of the treatment planning process.  Is now feeling much more comfortable having met treatment team and getting plan established.  Works as Theme [employer] manager, does not anticipate psychosocial issues complicating treatment.  Requested mailed copy of [facility] calendar and information, will review and access as needed.   ON[facility]N DISTRESS SCREENING 05/04/2017  Screening Type Initial Screening  Distress experienced in past week (1-10) 7  Emotional problem type Nervousness/Anxiety;Adjusting to illness  Physical Problem type Nausea/vomiting;Constipation/diarrhea    Clinical Social Worker follow up needed: No.  If yes, follow up plan:

## 2017-05-04 NOTE — Progress Notes (Signed)
Radiation Oncology         [phone] ________________________________  Initial Outpatient Consultation  Name: Courtney Romero MRN: [id number]  Date: 05/04/2017  DOB: 1960/05/04  CC:Courtney Ruths, MD  Erroll Luna, MD   REFERRING PHYSICIAN: Erroll Luna, MD  DIAGNOSIS:    ICD-10-CM   1. 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 female who presented with calcifications in the left breast detected on screening mammography. Diagnostic mammogram and ultrasound of the left breast showed focal distortion and calcifications within the superior slightly lateral breast around 12:00, as well as an indeterminate irregular breast mass at 11:00. Biopsy on 04/08/2017 of the distortion/calcifications showed invasive ductal carcinoma, low grade, arising in a background of DCIS. Biopsy of the mass revealed invasive ductal carcinoma, 0.2 cm in maximum dimension, also arising in the background of DCIS and with characteristics as described above in the diagnosis.  Ultrasound of the left axilla was negative for axillary adenopathy. She subsequently saw Dr. Brantley Stage for discussion of surgical options. She reports she has had two previous lumpectomies for fibrocystic change. Dr. Brantley Stage ordered bilateral breast MRI and referred her to oncology. MRI bilateral breasts on 04/28/2017 showed the  biopsy-proven malignancy within the left breast as well as 3 more suspicious masses within the right breast,  all less than 1 cm in size. She will undergo biopsy of these right breast mass(es) tomorrow. Nodes were negative on MRI.  Dr. Jana Hakim started her on neoadjuvant anti-estrogen therapy with tamoxifen given surgical delay due to a planned cruise in the [place]. He feels she would likely not benefit from chemotherapy. He has requested an appointment with genetics counseling and has recommended discussion of adjuvant radiation.  The patient has kindly been referred today for discussion of potential radiation treatment options. She is accompanied by her husband. She reports a family history of breast cancer in her great aunt. She states that she was on hormone replacement off and on for the past 3 years and stopped taking it 1 month ago. She works as a Designer, fashion/clothing.  On review of systems, she reports occasional occipital masses and submandibular swelling b/l in her neck.  PREVIOUS RADIATION THERAPY: No  PAST MEDICAL HISTORY:  has a past medical history of Abnormal CAT scan (2013), GERD (gastroesophageal reflux disease), and Hepatitis C (2001).    PAST SURGICAL HISTORY: Past Surgical History:  Procedure Laterality Date  . ABDOMINAL HYSTERECTOMY  2009  . breast cyst removal  1995  . BREAST LUMPECTOMY    . COLONOSCOPY  2011  . FLEXIBLE SIGMOIDOSCOPY  February 12, 2013   have normal perianal exam, question focal prolapse. 2 small scars in the rectum.  Marland Kitchen HEMORRHOID BANDING  2012   3 times over three months  . SIGMOIDOSCOPY  2013  . TONSILECTOMY/ADENOIDECTOMY WITH MYRINGOTOMY  remote  . UPPER GI ENDOSCOPY  2013    FAMILY HISTORY: family history includes Atrial fibrillation in her  father; Coronary artery disease in her father; Diabetes in her brother; Heart failure in her father; Hypertension in her mother; Thyroid disease in her mother.  SOCIAL HISTORY:  reports that she quit  smoking about 19 years ago. Her smoking use included cigarettes. She has a 30.00 pack-year smoking history. she has never used smokeless tobacco. She reports that she does not drink alcohol or use drugs.  ALLERGIES: Propoxyphene; Thioridazine hcl; Bupropion; Citalopram; Duloxetine; Thioridazine; and Venlafaxine  MEDICATIONS:  Current Outpatient Medications  Medication Sig Dispense Refill  . LORazepam (ATIVAN) 0.5 MG tablet Take 1 mg by mouth as needed.     Marland Kitchen omeprazole (PRILOSEC) 40 MG capsule Take 40 mg by mouth daily.    . tamoxifen (NOLVADEX) 20 MG tablet Take 1 tablet (20 mg total) by mouth daily. 90 tablet 12  . mirtazapine (REMERON) 7.5 MG tablet Take by mouth.    . zolpidem (AMBIEN) 5 MG tablet Take by mouth as needed.      Current Facility-Administered Medications  Medication Dose Route Frequency Provider Last Rate Last Dose  . cetirizine (ZYRTEC) tablet 10 mg  10 mg Oral QHS [name], MD        REVIEW OF SYSTEMS: as above   PHYSICAL EXAM:  height is _0  (1.702 m) and weight is 149 lb (67.6 kg). Her temperature is 97.8 F (36.6 C). Her blood pressure is 133/95 (abnormal) and her pulse is 93. Her oxygen saturation is 100%.   General: Alert and oriented, in no acute distress. HEENT: Head is normocephalic. Extraocular movements are intact. Oropharynx is clear. Neck: Neck is supple, no palpable cervical or supraclavicular lymphadenopathy. Heart: Regular in rate and rhythm with no murmurs, rubs, or gallops. Chest: Clear to auscultation bilaterally, with no rhonchi, wheezes, or rales. Abdomen: Soft, nontender, nondistended, with no rigidity or guarding. Extremities: No cyanosis or edema. Lymphatics: see Neck Exam Skin: No concerning lesions. Musculoskeletal: Symmetric strength and muscle tone throughout. Neurologic: Cranial nerves II through XII are grossly intact. No obvious focalities. Speech is fluent. Coordination is intact. Psychiatric: Judgment and insight are intact.  Affect is appropriate. Breasts: Athe 5:00 position of the left breast she has concavity of her breast at the overlying scar from prior excision. In the UIQ and UOQ of the left breast she has some heterogeneity of her tissue but no discrete mass. No palpable masses in the axillary regions bilaterally.   ECOG = 0  0 - Asymptomatic (Fully active, able to carry on all predisease activities without restriction)  1 - Symptomatic but completely ambulatory (Restricted in physically strenuous activity but ambulatory and able to carry out work of a light or sedentary nature. For example, light housework, office work)  2 - Symptomatic, <50% in bed during the day (Ambulatory and capable of all self care but unable to carry out any work activities. Up and about more than 50% of waking hours)  3 - Symptomatic, >50% in bed, but not bedbound (Capable of only limited self-care, confined to bed or chair 50% or more of waking hours)  4 - Bedbound (Completely disabled. Cannot carry on any self-care. Totally confined to bed or chair)  5 - Death   Eustace Pen MM, Creech RH, Tormey DC, et al. [phone]). "Toxicity and response criteria of the [facility]". [place] Oncol. 5 (6): 649-55   LABORATORY DATA:  Lab Results  Component Value Date   WBC 6.5 12/23/2011   HGB 13.2 12/23/2011   HCT 41.9 04/29/2017  MCV 92.3 04/29/2017   PLT 168 12/23/2011   CMP     Component Value Date/Time   NA 142 04/29/2017 1348   K 3.5 04/29/2017 1348   CL 107 04/29/2017 1348   CO2 24 04/29/2017 1348   GLUCOSE 131 04/29/2017 1348   BUN 15 04/29/2017 1348   CREATININE 0.74 12/23/2011 2115   CALCIUM 9.5 04/29/2017 1348   PROT 7.7 04/29/2017 1348   ALBUMIN 4.2 04/29/2017 1348   AST 15 04/29/2017 1348   ALT 14 04/29/2017 1348   ALKPHOS 70 04/29/2017 1348   BILITOT 0.7 04/29/2017 1348   GFRNONAA >90 12/23/2011 2115   GFRAA >90 12/23/2011 2115         RADIOGRAPHY: Mr Breast Bilateral Ralls  Cad  Addendum Date: 04/29/2017   ADDENDUM REPORT: 04/29/2017 13:08 ADDENDUM: Error in report: Both in the Impression and the Recommendation sections, I mistakenly stated that ultrasound-guided biopsies are recommended. These should say "MRI guided biopsies are recommended". Specifically, Impression #1 should say ".MRI guided biopsies are recommended for these 2 masses". Recommendation #1 should say "MRI guided biopsies of the 2 suspicious enhancing masses within the right breast, upper inner quadrant and lower outer quadrant, both at middle depth, measuring 7 mm and 8 mm respectively (series 6, images 65 and 101 respectively)". Electronically Signed   By: Franki Cabot M.D.   On: 04/29/2017 13:08   Result Date: 04/29/2017 CLINICAL DATA:  Recently diagnosed left breast cancer at 2 sites of the upper left breast, 11 o'clock and 12 o'clock axes. History of right breast lumpectomy 7 years ago. Additional history of previous excisional biopsy of a high risk lesion (complex sclerosing lesion) within the lower outer quadrant of the left breast in 2011. LABS:  Not applicable EXAM: BILATERAL BREAST MRI WITH AND WITHOUT CONTRAST TECHNIQUE: Multiplanar, multisequence MR images of both breasts were obtained prior to and following the intravenous administration of 14 ml of MultiHance. THREE-DIMENSIONAL MR IMAGE RENDERING ON INDEPENDENT WORKSTATION: Three-dimensional MR images were rendered by post-processing of the original MR data on an independent workstation. The three-dimensional MR images were interpreted, and findings are reported in the following complete MRI report for this study. Three dimensional images were evaluated at the independent DynaCad workstation COMPARISON:  Previous exams including diagnostic mammogram and ultrasound dated 04/04/2017. FINDINGS: Breast composition: b. Scattered fibroglandular tissue. Background parenchymal enhancement: Mild Right breast: Suspicious enhancing mass within the upper inner  quadrant of the right breast, at middle depth, measuring 7 mm, T2 hyperintense, with mixed enhancement kinetics including suspicious washout (series 6, image 65). Additional similar suspicious enhancing mass within the lower outer quadrant of the right breast, at middle depth, measuring 8 mm, T2 hyperintense, with persistent enhancement kinetics (series 6, image 101). Additional similar-appearing mass within the lower inner quadrant of the right breast, at anterior depth, measuring 5 mm, T2 hyperintense, with sub threshold enhancement kinetics (also series 6, image 101). No additional enhancing masses, non mass enhancement or secondary signs of malignancy are identified within the right breast. Left breast: Contiguous mass and non mass enhancement from the upper central left breast (11 o'clock axis) extending superior laterally to the upper-outer quadrant (12-1 o'clock axis), consistent with patient's recent biopsy-proven carcinomas, overall measuring 4 cm craniocaudal dimension and up to 1.5 cm greatest transverse dimension (series 6, images 48 through 70). Associated biopsy clips are well-positioned at the most superior and inferior aspects of this mass and non mass enhancement. There are no additional suspicious enhancing masses, non  mass enhancement or secondary signs of malignancy within the left breast. Lymph nodes: No abnormal appearing lymph nodes. Ancillary findings:  None. IMPRESSION: 1. Two suspicious enhancing masses within the RIGHT breast, upper inner quadrant and lower outer quadrant, measuring 7 mm and 8 mm respectively, suspicious for contralateral disease. Locations/image numbers detailed above. Ultrasound-guided biopsies are recommended for these 2 masses. 2. Additional enhancing mass within the right breast, lower inner quadrant, at anterior depth, measuring 5 mm. This mass demonstrates sub threshold kinetics but is otherwise similar in appearance to the 2 additional masses within the right  breast. 3. Biopsy-proven malignancy within the LEFT breast. The combination of mass and non mass enhancement measures 4 cm craniocaudal dimension and 1.5 cm greatest transverse dimension. Biopsy clips are well-positioned (bracketed) at the most superior and inferior aspects of this mass and non mass enhancement corresponding to the sites of biopsy-proven malignancy. 4. No evidence of additional multifocal or multicentric disease within the left breast. 5. No evidence of metastatic lymphadenopathy. RECOMMENDATION: 1. Ultrasound-guided biopsies of the 2 suspicious enhancing masses within the right breast, upper inner quadrant and lower outer quadrant, both at middle depth, measuring 7 mm and 8 mm respectively (series 6, images 65 and 101 respectively). 2. If pathology results for the 2 MRI guided biopsies are both benign, the third similar appearing lesion within the anterior right breast can be also considered benign. If pathology results for 1 or both MRI guided biopsies are positive for malignancy, recommend second look targeted ultrasound and possible ultrasound-guided biopsy for this third lesion in the right breast (this lesion does not appear amenable to MRI guided biopsy due to its anterior/superficial location). BI-RADS CATEGORY  4: Suspicious. Electronically Signed: By: Franki Cabot M.D. On: 04/29/2017 11:28   US Breast Ltd Uni Left Inc Axilla  Result Date: 04/04/2017 CLINICAL DATA:  Patient recalled from screening for left breast mass and calcifications. EXAM: 2D DIGITAL DIAGNOSTIC LEFT MAMMOGRAM WITH CAD AND ADJUNCT TOMO ULTRASOUND LEFT BREAST COMPARISON:  Previous exam(s). ACR Breast Density Category c: The breast tissue is heterogeneously dense, which may obscure small masses. FINDINGS: Spot compression CC and full paddle true lateral tomosynthesis images of the left breast were obtained. Additional magnification CC and true lateral views of the left breast obtained. Within the upper-outer left  breast middle depth there is a 1.2 cm group of coarse heterogeneous calcifications, further evaluated with magnification views. There is persistent distortion demonstrated at this location on spot compression mammography. Mammographic images were processed with CAD. On physical exam, I palpate no discrete mass within the superior and superolateral left breast. Targeted ultrasound is performed, showing a 6 x 8 x 10 mm irregular hypoechoic mass left breast 11 o'clock position 3 cm from the nipple. Within the left breast 12 o'clock position 4 cm from nipple there is a 1.5 x 1.0 x 1.3 cm focal area of posterior acoustic shadowing, felt to correspond with the area of distortion and calcifications on mammography. IMPRESSION: 1. Persistent focal distortion and calcifications within the superior slightly lateral left breast with sonographic correlate. In order to sample both the calcifications and distortion, recommend biopsy of this area with stereotactic guidance. 2. Indeterminate irregular left breast mass 11 o'clock position. RECOMMENDATION: 1. Stereotactic guided core needle biopsy focal distortion and calcifications within the upper-outer left breast. 2. Ultrasound-guided core needle biopsy left breast mass 11 o'clock position. I have discussed the findings and recommendations with the patient. Results were also provided in writing at the conclusion of the visit.  If applicable, a reminder letter will be sent to the patient regarding the next appointment. BI-RADS CATEGORY  4: Suspicious. Electronically Signed   By: Lovey Newcomer M.D.   On: 04/04/2017 12:04   Mm Diag Breast Tomo Uni Left  Result Date: 04/04/2017 CLINICAL DATA:  Patient recalled from screening for left breast mass and calcifications. EXAM: 2D DIGITAL DIAGNOSTIC LEFT MAMMOGRAM WITH CAD AND ADJUNCT TOMO ULTRASOUND LEFT BREAST COMPARISON:  Previous exam(s). ACR Breast Density Category c: The breast tissue is heterogeneously dense, which may obscure small  masses. FINDINGS: Spot compression CC and full paddle true lateral tomosynthesis images of the left breast were obtained. Additional magnification CC and true lateral views of the left breast obtained. Within the upper-outer left breast middle depth there is a 1.2 cm group of coarse heterogeneous calcifications, further evaluated with magnification views. There is persistent distortion demonstrated at this location on spot compression mammography. Mammographic images were processed with CAD. On physical exam, I palpate no discrete mass within the superior and superolateral left breast. Targeted ultrasound is performed, showing a 6 x 8 x 10 mm irregular hypoechoic mass left breast 11 o'clock position 3 cm from the nipple. Within the left breast 12 o'clock position 4 cm from nipple there is a 1.5 x 1.0 x 1.3 cm focal area of posterior acoustic shadowing, felt to correspond with the area of distortion and calcifications on mammography. IMPRESSION: 1. Persistent focal distortion and calcifications within the superior slightly lateral left breast with sonographic correlate. In order to sample both the calcifications and distortion, recommend biopsy of this area with stereotactic guidance. 2. Indeterminate irregular left breast mass 11 o'clock position. RECOMMENDATION: 1. Stereotactic guided core needle biopsy focal distortion and calcifications within the upper-outer left breast. 2. Ultrasound-guided core needle biopsy left breast mass 11 o'clock position. I have discussed the findings and recommendations with the patient. Results were also provided in writing at the conclusion of the visit. If applicable, a reminder letter will be sent to the patient regarding the next appointment. BI-RADS CATEGORY  4: Suspicious. Electronically Signed   By: Lovey Newcomer M.D.   On: 04/04/2017 12:04   [place] Axilla Left  Result Date: 04/18/2017 CLINICAL DATA:  Evaluate left axillary nodes. Known left-sided breast cancer. EXAM: ULTRASOUND  OF THE LEFT AXILLA COMPARISON:  None FINDINGS: On physical exam,no suspicious lumps are identified. Ultrasound is performed, showing no abnormal lymph nodes in the left axilla. IMPRESSION: No left axillary adenopathy. RECOMMENDATION: Continued surgical and oncologic follow up. I have discussed the findings and recommendations with the patient. Results were also provided in writing at the conclusion of the visit. If applicable, a reminder letter will be sent to the patient regarding the next appointment. BI-RADS CATEGORY  2: Benign. Electronically Signed   By: Dorise Bullion III M.D   On: 04/18/2017 10:57   Mm Clip Placement Left  Result Date: 04/08/2017 CLINICAL DATA:  57 year old female status post stereotactic and ultrasound-guided biopsies. EXAM: DIAGNOSTIC LEFT MAMMOGRAM POST ULTRASOUND all caps BIOPSY COMPARISON:  Previous exam(s). FINDINGS: Mammographic images were obtained following stereotactic guided biopsy of of the upper-outer left breast ultrasound-guided biopsy of the upper inner left breast. A coil shaped clip is identified in the upper outer quadrant and a ribbon shaped clip is identified in the upper inner quadrant. Both are in the expected location status post biopsy. IMPRESSION: Two post biopsy clips in the expected locations status post stereotactic and ultrasound-guided biopsy. Final Assessment: Post Procedure Mammograms for Marker Placement Electronically Signed  By: Kristopher Oppenheim M.D.   On: 04/08/2017 10:07   Mm Lt Breast Bx W Loc Dev 1st Lesion Image Bx Spec Stereo Guide  Addendum Date: 04/15/2017   ADDENDUM REPORT: 04/15/2017 07:18 ADDENDUM: Pathology revealed INVASIVE DUCTAL CARCINOMA, LOW GRADE, ARISING IN A BACKGROUND OF DUCTAL CARCINOMA IN SITU of the Left breast, both locations, 12:00 o'clock, 4cmfn and 11:00 o'clock 3cmfn. This was found to be concordant by Dr. Kristopher Oppenheim. Pathology results were discussed with the patient by telephone. The patient reported doing well  after the biopsies with tenderness at the sites. Post biopsy instructions and care were reviewed and questions were answered. The patient was encouraged to call [facility] for any additional concerns. Surgical consultation has been arranged with Dr. Erroll Luna at [facility], per patient request, on April 20, 2017. The patient is scheduled for a left axillary ultrasound on April 18, 2017. Additional evaluation with bilateral breast MRI should be considered to evaluate extent of disease given the patient's breast density. Pathology results reported by Terie Purser, RN on 04/15/2017. Electronically Signed   By: Kristopher Oppenheim M.D.   On: 04/15/2017 07:18   Result Date: 04/15/2017 CLINICAL DATA:  57 year old female with focal distortion calcifications in the upper-outer left breast and hypoechoic mass in the upper inner left breast. EXAM: LEFT BREAST STEREOTACTIC CORE NEEDLE BIOPSY LEFT BREAST ULTRASOUND CORE NEEDLE BIOPSY COMPARISON:  Previous exams. FINDINGS: The patient and I discussed the procedure of stereotactic-guided biopsy including benefits and alternatives. We discussed the high likelihood of a successful procedure. We discussed the risks of the procedure including infection, bleeding, tissue injury, clip migration, and inadequate sampling. Informed written consent was given. The usual time out protocol was performed immediately prior to the procedure. Using sterile technique and 1% Lidocaine as local anesthetic, under stereotactic guidance, a 9 gauge vacuum assisted device was used to perform core needle biopsy of calcifications/ distortion in the upper-outer quadrant of the left breast using a superior approach. Specimen radiograph was performed showing calcifications in several specimen. Specimens with calcifications are identified for pathology. Lesion quadrant: Upper-outer quadrant At the conclusion of the procedure, a coil shaped tissue marker clip  was deployed into the biopsy cavity. Using sterile technique and 1% Lidocaine as local anesthetic, under direct ultrasound visualization, a 12 gauge spring-loaded device was used to perform biopsy of a left breast mass at the 11 o'clock position using a medial approach. At the conclusion of the procedure a ribbon shaped tissue marker clip was deployed into the biopsy cavity. Lesion quadrant: Upper inner quadrant Follow up 2 view mammogram was performed and dictated separately. IMPRESSION: Stereotactic-guided biopsy of left breast distortion/ calcifications and ultrasound-guided biopsy of a left breast mass. No apparent complications. Electronically Signed: By: Kristopher Oppenheim M.D. 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 This was found to be concordant by Dr. Kristopher Oppenheim. Pathology results were discussed with the patient by telephone. The patient reported doing well after the biopsies with tenderness at the sites. Post biopsy instructions and care were reviewed and questions were answered. The patient was encouraged to call [facility] [place] for any additional concerns. Surgical consultation has  been arranged with Dr. Erroll Luna at [facility], per patient request, on April 20, 2017. The patient is scheduled for a left axillary ultrasound on April 18, 2017. Additional evaluation with bilateral breast MRI should be considered to evaluate extent of disease given the patient's breast density. Pathology results reported by Terie Purser, RN on 04/15/2017. Electronically Signed   By: Kristopher Oppenheim M.D.   On: 04/15/2017 07:18   Result Date: 04/15/2017 CLINICAL DATA:   57 year old female with focal distortion calcifications in the upper-outer left breast and hypoechoic mass in the upper inner left breast. EXAM: LEFT BREAST STEREOTACTIC CORE NEEDLE BIOPSY LEFT BREAST ULTRASOUND CORE NEEDLE BIOPSY COMPARISON:  Previous exams. FINDINGS: The patient and I discussed the procedure of stereotactic-guided biopsy including benefits and alternatives. We discussed the high likelihood of a successful procedure. We discussed the risks of the procedure including infection, bleeding, tissue injury, clip migration, and inadequate sampling. Informed written consent was given. The usual time out protocol was performed immediately prior to the procedure. Using sterile technique and 1% Lidocaine as local anesthetic, under stereotactic guidance, a 9 gauge vacuum assisted device was used to perform core needle biopsy of calcifications/ distortion in the upper-outer quadrant of the left breast using a superior approach. Specimen radiograph was performed showing calcifications in several specimen. Specimens with calcifications are identified for pathology. Lesion quadrant: Upper-outer quadrant At the conclusion of the procedure, a coil shaped tissue marker clip was deployed into the biopsy cavity. Using sterile technique and 1% Lidocaine as local anesthetic, under direct ultrasound visualization, a 12 gauge spring-loaded device was used to perform biopsy of a left breast mass at the 11 o'clock position using a medial approach. At the conclusion of the procedure a ribbon shaped tissue marker clip was deployed into the biopsy cavity. Lesion quadrant: Upper inner quadrant Follow up 2 view mammogram was performed and dictated separately. IMPRESSION: Stereotactic-guided biopsy of left breast distortion/ calcifications and ultrasound-guided biopsy of a left breast mass. No apparent complications. Electronically Signed: By: Kristopher Oppenheim M.D. On: 04/08/2017 10:08      IMPRESSION/PLAN: Left Breast Cancer    It was a pleasure meeting the patient today. We discussed the risks, benefits, and side effects of radiotherapy.  If she undergoes mastectomy she probably will not need RT.  In the setting of breast conservation, I recommend radiotherapy to the left side, possibly the right pending tomorrow's biopsy, to reduce her risk of locoregional recurrence by 2/3.  We discussed that radiation would take approximately 4 weeks to complete and that I would give the patient a few weeks to heal following surgery before starting treatment planning. If chemotherapy were to be given, this would precede radiotherapy. We spoke about acute effects including skin irritation and fatigue as well as much less common late effects including internal organ injury or irritation. We spoke about the latest technology that is used to minimize the risk of late effects for patients undergoing radiotherapy to the breast or chest wall. No guarantees of treatment were given. The patient is enthusiastic about proceeding with treatment. I look forward to participating in the patient's care. We discussed and signed the radiation oncology consent form, and a copy was retained for our records. I will await her referral back to me for postoperative follow-up and eventual CT simulation/treatment planning.  We discussed that if the patient were to forego her planned vacation, it would more than likely not impact her prognosis. I encouraged her to discuss this further with Dr.  Cornett following her right breast biopsy tomorrow to prior to making her final decision.  Plastic surgery referral pending by Dr Brantley Stage. She will seek an appt with Dr. Brantley Stage after bx tomorrow of right breast.  I spent >50 minutes face to face with the patient and more than 50% of that time was spent in counseling and/or coordination of care.   __________________________________________   Eppie Gibson, MD  This document serves as a record of services personally  performed by Eppie Gibson, MD. It was created on her behalf by Rae Lips, a trained medical scribe. The creation of this record is based on the scribe's personal observations and the provider's statements to them. This document has been checked and approved by the attending provider.

## 2017-05-05 ENCOUNTER — Ambulatory Visit
Admission: RE | Admit: 2017-05-05 | Discharge: 2017-05-05 | Disposition: A | Discharge status: Home / Self Care | Source: Ambulatory Visit | Attending: Oncology | Admitting: Oncology

## 2017-05-05 DIAGNOSIS — B182 Chronic viral hepatitis C: Secondary | ICD-10-CM

## 2017-05-05 DIAGNOSIS — Z170 Estrogen receptor positive status [ER+]: Principal | ICD-10-CM

## 2017-05-05 DIAGNOSIS — C50412 Malignant neoplasm of upper-outer quadrant of left female breast: Secondary | ICD-10-CM

## 2017-05-05 DIAGNOSIS — I700 Atherosclerosis of aorta: Secondary | ICD-10-CM

## 2017-05-05 MED ORDER — GADOBENATE DIMEGLUMINE 529 MG/ML IV SOLN: 14.0000 mL | Freq: Once | INTRAVENOUS | Status: DC | PRN

## 2017-05-13 ENCOUNTER — Ambulatory Visit: Payer: Self-pay | Admitting: Surgery

## 2017-05-13 DIAGNOSIS — Z170 Estrogen receptor positive status [ER+]: Principal | ICD-10-CM

## 2017-05-13 DIAGNOSIS — C50912 Malignant neoplasm of unspecified site of left female breast: Secondary | ICD-10-CM

## 2017-05-13 NOTE — H&P (Signed)
Courtney Romero Documented: 05/13/2017 10:40 AM Location: [facility] Patient #: [id number] DOB: 08/01/1960 Married / Language: English / Race: White Female  History of Present Illness (Thomas A. Cornett MD; 05/13/2017 11:06 AM) Patient words: Patient returns after MRI. She had 2 areas biopsied the right side which showed fibrocystic type changes without atypia. She would like to proceed with left breast lumpectomy and both areas were localized on MRI. Both areas are small one in the upper inner left breast and the second in the lower outer quadrant. Both were less than a centimeter in size.                    Diagnosis 1. Breast, right, needle core biopsy, lower outer - FIBROADENOMA, PARTIALLY FIBROTIC. - FIBROCYSTIC CHANGES WITH USUAL DUCTAL HYPERPLASIA, CALCIFICATIONS AND FOCAL SCLEROSING ADENOSIS. - NO EVIDENCE OF MALIGNANCY. 2. Breast, right, needle core biopsy, upper inner - FIBROADENOMA. - FIBROCYSTIC CHANGES WITH USUAL DUCTAL HYPERPLASIA AND CALCIFICATIONS. - NO EVIDENCE OF MALIGNANCY. Microscopic Comment 1. Called to [facility] on 05/06/17. (JDP:gt, 05/06/17) JOHN PATRICK MD Pathologist, Electronic Signature (Case signed 05/06/2017)          Recently diagnosed left breast cancer at 2 sites of the upper left breast, 11 o'clock and 12 o'clock axes.  History of right breast lumpectomy 7 years ago. Additional history of previous excisional biopsy of a high risk lesion (complex sclerosing lesion) within the lower outer quadrant of the left breast in 2011.  LABS: Not applicable  EXAM: BILATERAL BREAST MRI WITH AND WITHOUT CONTRAST  TECHNIQUE: Multiplanar, multisequence MR images of both breasts were obtained prior to and following the intravenous administration of 14 ml of MultiHance.  THREE-DIMENSIONAL MR IMAGE RENDERING ON INDEPENDENT WORKSTATION:  Three-dimensional MR images were rendered by post-processing  of the original MR data on an independent workstation. The three-dimensional MR images were interpreted, and findings are reported in the following complete MRI report for this study. Three dimensional images were evaluated at the independent DynaCad workstation  COMPARISON: Previous exams including diagnostic mammogram and ultrasound dated 04/04/2017.  FINDINGS: Breast composition: b. Scattered fibroglandular tissue.  Background parenchymal enhancement: Mild  Right breast: Suspicious enhancing mass within the upper inner quadrant of the right breast, at middle depth, measuring 7 mm, T2 hyperintense, with mixed enhancement kinetics including suspicious washout (series 6, image 65).  Additional similar suspicious enhancing mass within the lower outer quadrant of the right breast, at middle depth, measuring 8 mm, T2 hyperintense, with persistent enhancement kinetics (series 6, image 101).  Additional similar-appearing mass within the lower inner quadrant of the right breast, at anterior depth, measuring 5 mm, T2 hyperintense, with sub threshold enhancement kinetics (also series 6, image 101).  No additional enhancing masses, non mass enhancement or secondary signs of malignancy are identified within the right breast.  Left breast: Contiguous mass and non mass enhancement from the upper central left breast (11 o'clock axis) extending superior laterally to the upper-outer quadrant (12-1 o'clock axis), consistent with patient's recent biopsy-proven carcinomas, overall measuring 4 cm craniocaudal dimension and up to 1.5 cm greatest transverse dimension (series 6, images 48 through 70).  Associated biopsy clips are well-positioned at the most superior and inferior aspects of this mass and non mass enhancement.                           2. PROGNOSTIC INDICATORS Results: IMMUNOHISTOCHEMICAL AND MORPHOMETRIC ANALYSIS PERFORMED MANUALLY Estrogen Receptor:    100%, POSITIVE, STRONG STAINING INTENSITY Progesterone Receptor: 100%, POSITIVE, STRONG STAINING INTENSITY Proliferation Marker Ki67: 5% REFERENCE RANGE ESTROGEN RECEPTOR NEGATIVE 0% POSITIVE =>1% REFERENCE RANGE PROGESTERONE RECEPTOR NEGATIVE 0% POSITIVE =>1% All controls stained appropriately [name] Pathologist, Electronic Signature ( Signed 04/18/2017) 2. FLUORESCENCE IN-SITU HYBRIDIZATION Results: HER2 - NEGATIVE RATIO OF HER2/CEP17 SIGNALS 1.35 AVERAGE HER2 COPY NUMBER PER CELL 2.10 Reference Range: NEGATIVE HER2/CEP17 Ratio <2.0 and average HER2 copy number <4.0 EQUIVOCAL HER2/CEP17 Ratio <2.0 and average HER2 copy number >=4.0 and <6.0 1 of 3 FINAL for KAMREN, HESKETT ([id number]) ADDITIONAL INFORMATION:(continued) POSITIVE HER2/CEP17 Ratio >=2.0 or <2.0 and average HER2 copy number >=6.0 [name] Pathologist, Electronic Signature ( Signed 04/15/2017) FINAL DIAGNOSIS Diagnosis 1. 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 - SEE COMMENT. 2. 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 - SEE COMMENT. Microscopic Comment 1. & 2. Specimen #1 shows extensive low grade ductal carcinoma in situ, which is demonstrated with positive p63, smooth muscle myosin heavy chain, and calponin immunostains. Some of the lesion shows attenuation of the myoepithelial layer, and in other areas it is absent entirely. The evaluation of the overall size of the invasive component is problematic, but it appears to be at least 0.3 cm in maximal dimension, and may indeed be greater. The #2 specimen shows a 0.6 cm maximal dimension area of what appears to be primarily low grade ductal carcinoma in situ. An approximately 0.2 cm in maximal dimension area of  this process shows negative staining for calponin, smooth muscle myosin heavy chain, and p63 immunostaining, supporting the diagnosis of invasive carcinoma. Other areas of the process show attenuation, and so, as noted above, the actual invasive component may be larger than that measured here. Material will be taken for receptor studies, and the results will be reported in an addendum. (MJ:ah 04/14/17) [name] Pathologist, Electronic Signature (Case signed 04/14/2017) Specimen Gross and Clinical Information Specimen Comment 1. TIF: 9:20 AM; extracted < 5 min; calcs/distortion 2. TIF: 9:45 AM; extracted < 1 min; mass Specimen(s) Obtained: 1. Breast, left, needle core biopsy, 12:00 o'clock, 4cmfn 2. Breast, left, needle core biopsy, 11:00 o'clock 3cmfn There are no additional suspicious enhancing masses, non mass enhancement or secondary signs of malignancy within the left breast.  Lymph nodes: No abnormal appearing lymph nodes.  Ancillary findings: None.  IMPRESSION: 1. Two suspicious enhancing masses within the RIGHT breast, upper inner quadrant and lower outer quadrant, measuring 7 mm and 8 mm respectively, suspicious for contralateral disease. Locations/image numbers detailed above. Ultrasound-guided biopsies are recommended for these 2 masses. 2. Additional enhancing mass within the right breast, lower inner quadrant, at anterior depth, measuring 5 mm. This mass demonstrates sub threshold kinetics but is otherwise similar in appearance to the 2 additional masses within the right breast. 3. Biopsy-proven malignancy within the LEFT breast. The combination of mass and non mass enhancement measures 4 cm craniocaudal dimension and 1.5 cm greatest transverse dimension. Biopsy clips are well-positioned (bracketed) at the most superior and inferior aspects of this mass and non mass enhancement corresponding to the sites of biopsy-proven malignancy. 4. No evidence of  additional multifocal or multicentric disease within the left breast. 5. No evidence of metastatic lymphadenopathy.  RECOMMENDATION: 1. Ultrasound-guided biopsies of the 2 suspicious enhancing masses within the  right breast, upper inner quadrant and lower outer quadrant, both at middle depth, measuring 7 mm and 8 mm respectively (series 6, images 65 and 101 respectively). 2. If pathology results for the 2 MRI guided biopsies are both benign, the third similar appearing lesion within the anterior right breast can be also considered benign. If pathology results for 1 or both MRI guided biopsies are positive for malignancy, recommend second look targeted ultrasound and possible ultrasound-guided biopsy for this third lesion in the right breast (this lesion does not appear amenable to MRI guided biopsy due to its anterior/superficial location).  BI-RADS CATEGORY 4: Suspicious.  Electronically Signed: By: Stan Maynard M.D. On: 04/29/2017 11:28.  The patient is a 56 year old female.   Problem List/Past Medical (Michelle R. Brooks, CMA; 05/13/2017 10:43 AM) MALIGNANT NEOPLASM OF OVERLAPPING SITES OF LEFT BREAST IN FEMALE, ESTROGEN RECEPTOR POSITIVE (C50.812) BREAST CANCER, LEFT (C50.912)  Past Surgical History (Michelle R. Brooks, CMA; 05/13/2017 10:42 AM) Breast Biopsy Left. Colon Polyp Removal - Colonoscopy Hysterectomy (not due to cancer) - Partial Tonsillectomy  Diagnostic Studies History (Michelle R. Brooks, CMA; 05/13/2017 10:42 AM) Colonoscopy 5-10 years ago Mammogram within last year Pap Smear 1-5 years ago  Allergies (Michelle R. Brooks, CMA; 05/13/2017 10:42 AM) Propoxyphene Compound *ANALGESICS - OPIOID* Thioridazine HCl *CHEMICALS* BuPROPion HCl *ANTIDEPRESSANTS* Citalopram Hydrobromide *ANTIDEPRESSANTS* DULoxetine HCl *CHEMICALS* Venlafaxine HCl *ANTIDEPRESSANTS*  Medication History (Michelle R. Brooks, CMA; 05/13/2017 10:44 AM) Cholecalciferol  (1000UNIT Capsule, Oral) Active. LORazepam (0.5MG Tablet, Oral) Active. Omeprazole (40MG Capsule DR, Oral) Active. Zolpidem Tartrate (5MG Tablet, Oral) Active. Tamoxifen Citrate (20MG Tablet, Oral) Active. Medications Reconciled  Social History (Michelle R. Brooks, CMA; 05/13/2017 10:42 AM) Caffeine use Coffee. No alcohol use No drug use Tobacco use Former smoker.  Family History (Michelle R. Brooks, CMA; 05/13/2017 10:43 AM) Arthritis Mother. Breast Cancer Family Members In General. Colon Polyps Father, Mother. Depression Mother. Diabetes Mellitus Brother. Heart Disease Father. Heart disease in female family member before age 55 Hypertension Father, Mother. Malignant Neoplasm Of Pancreas Family Members In General. Prostate Cancer Father. Thyroid problems Father, Mother.  Pregnancy / Birth History (Michelle R. Brooks, CMA; 05/13/2017 10:42 AM) Age at menarche 14 years. Age of menopause [age] Contraceptive History Oral contraceptives. Gravida 0 Para 0  Other Problems (Michelle R. Brooks, CMA; 05/13/2017 10:43 AM) Anxiety Disorder Diverticulosis Gastroesophageal Reflux Disease General anesthesia - complications Hemorrhoids Hepatitis    Vitals (Michelle R. Brooks CMA; 05/13/2017 10:42 AM) 05/13/2017 10:42 AM Weight: 149.5 lb Height: 67in Body Surface Area: 1.79 m Body Mass Index: 23.41 kg/m  BP: 124/82 (Sitting, Left Arm, Standard)      Physical Exam (Thomas A. Cornett MD; 05/13/2017 11:06 AM)  General Mental Status-Alert. General Appearance-Consistent with stated age. Hydration-Well hydrated. Voice-Normal.  Breast Note: Not reexamined today  Cardiovascular Cardiovascular examination reveals -on palpation PMI is normal in location and amplitude, no palpable S3 or S4. Normal cardiac borders., normal heart sounds, regular rate and rhythm with no murmurs, carotid auscultation reveals no bruits and normal pedal pulses  bilaterally.  Neurologic Neurologic evaluation reveals -alert and oriented x 3 with no impairment of recent or remote memory. Mental Status-Normal.  Musculoskeletal Normal Exam - Left-Upper Extremity Strength Normal and Lower Extremity Strength Normal. Normal Exam - Right-Upper Extremity Strength Normal, Lower Extremity Weakness.    Assessment & Plan (Thomas A. Cornett MD; 05/13/2017 11:05 AM)  MALIGNANT NEOPLASM OF OVERLAPPING SITES OF LEFT BREAST IN FEMALE, ESTROGEN RECEPTOR POSITIVE (C50.812) Impression: Patient has opted for left breast lumpectomy 2 with   less than lift a mapping. Discussed the need for reexcision and possible mastectomy if this fails. She is now on tamoxifen in a neoadjuvant setting since she is taking decreased next month and does not want to miss that which I think is fine. I discussed the procedure as well as possibility of mastectomy reconstruction on the right at this fails. I discussed reexcision rates with her. I discussed potential cosmetic issues. Risk of lumpectomy include bleeding, infection, seroma, more surgery, use of seed/wire, wound care, cosmetic deformity and the need for other treatments, death , blood clots, death. Pt agrees to proceed. Risk of sentinel lymph node mapping include bleeding, infection, lymphedema, shoulder pain. stiffness, dye allergy. cosmetic deformity , blood clots, death, need for more surgery. Pt agres to proceed.  Current Plans Pt Education - [facility] Breast Biopsy HCI: discussed with patient and provided information. Pt Education - ABC (After Breast Cancer) Class Info: discussed with patient and provided information. Pt Education - [facility] breast cancer surgery: discussed with patient and provided information. We discussed the staging and pathophysiology of breast cancer. We discussed all of the different options for treatment for breast cancer including surgery, chemotherapy, radiation therapy, Herceptin, and antiestrogen  therapy. We discussed a sentinel lymph node biopsy as she does not appear to having lymph node involvement right now. We discussed the performance of that with injection of radioactive tracer and blue dye. We discussed that she would have an incision underneath her axillary hairline. We discussed that there is a bout a 10-20% chance of having a positive node with a sentinel lymph node biopsy and we will await the permanent pathology to make any other first further decisions in terms of her treatment. One of these options might be to return to the operating room to perform an axillary lymph node dissection. We discussed about a 1-2% risk lifetime of chronic shoulder pain as well as lymphedema associated with a sentinel lymph node biopsy. We discussed the options for treatment of the breast cancer which included lumpectomy versus a mastectomy. We discussed the performance of the lumpectomy with a wire placement. We discussed a 10-20% chance of a positive margin requiring reexcision in the operating room. We also discussed that she may need radiation therapy or antiestrogen therapy or both if she undergoes lumpectomy. We discussed the mastectomy and the postoperative care for that as well. We discussed that there is no difference in her survival whether she undergoes lumpectomy with radiation therapy or antiestrogen therapy versus a mastectomy. There is a slight difference in the local recurrence rate being 3-5% with lumpectomy and about 1% with a mastectomy. We discussed the risks of operation including bleeding, infection, possible reoperation. She understands her further therapy will be based on what her stages at the time of her operation. 

## 2017-05-16 ENCOUNTER — Encounter: Payer: Self-pay | Admitting: *Deleted

## 2017-05-16 NOTE — Progress Notes (Signed)
  Oncology Nurse Navigator Documentation  Navigator Location: [facility] (05/16/17 1600)   )                          Barriers/Navigation Needs: Education (05/16/17 1600) Education: Other (05/16/17 1600) Interventions: Referrals (05/16/17 1600)        Support Groups/Services: Breast Support Group (05/16/17 1600)             Time Spent with Patient: 15 (05/16/17 1600)   Patient's husband came by requesting that one of the navigators call his wife about available support groups.  Called and informed patient of our every other month support group for breast cancer patients that meets every other month.  Next meeting on 06/23/17.  Number given for patient to register.

## 2017-05-18 ENCOUNTER — Other Ambulatory Visit: Payer: Self-pay | Admitting: Surgery

## 2017-05-18 DIAGNOSIS — C50912 Malignant neoplasm of unspecified site of left female breast: Secondary | ICD-10-CM

## 2017-05-18 DIAGNOSIS — Z170 Estrogen receptor positive status [ER+]: Principal | ICD-10-CM

## 2017-05-19 ENCOUNTER — Encounter (HOSPITAL_BASED_OUTPATIENT_CLINIC_OR_DEPARTMENT_OTHER): Payer: Self-pay | Admitting: *Deleted

## 2017-05-20 ENCOUNTER — Encounter: Payer: Self-pay | Admitting: *Deleted

## 2017-05-30 ENCOUNTER — Encounter (HOSPITAL_BASED_OUTPATIENT_CLINIC_OR_DEPARTMENT_OTHER)
Admission: RE | Admit: 2017-05-30 | Discharge: 2017-05-30 | Disposition: A | Discharge status: Home / Self Care | Source: Ambulatory Visit | Attending: Surgery | Admitting: Surgery

## 2017-05-30 DIAGNOSIS — Z170 Estrogen receptor positive status [ER+]: Secondary | ICD-10-CM | POA: Diagnosis not present

## 2017-05-30 DIAGNOSIS — C50912 Malignant neoplasm of unspecified site of left female breast: Secondary | ICD-10-CM | POA: Insufficient documentation

## 2017-05-30 LAB — CBC WITH DIFFERENTIAL/PLATELET
Basophils Absolute: 0 10*3/uL (ref 0.0–0.1)
Basophils Relative: 0 %
Eosinophils Absolute: 0.1 10*3/uL (ref 0.0–0.7)
Eosinophils Relative: 2 %
HCT: 41.9 % (ref 36.0–46.0)
Hemoglobin: 13.9 g/dL (ref 12.0–15.0)
Lymphocytes Relative: 41 %
Lymphs Abs: 1.7 10*3/uL (ref 0.7–4.0)
MCH: 30.8 pg (ref 26.0–34.0)
MCHC: 33.2 g/dL (ref 30.0–36.0)
MCV: 92.7 fL (ref 78.0–100.0)
Monocytes Absolute: 0.3 10*3/uL (ref 0.1–1.0)
Monocytes Relative: 6 %
Neutro Abs: 2.2 10*3/uL (ref 1.7–7.7)
Neutrophils Relative %: 51 %
Platelets: 151 10*3/uL (ref 150–400)
RBC: 4.52 MIL/uL (ref 3.87–5.11)
RDW: 13.1 % (ref 11.5–15.5)
WBC: 4.2 10*3/uL (ref 4.0–10.5)

## 2017-05-30 LAB — COMPREHENSIVE METABOLIC PANEL
ALT: 19 U/L (ref 14–54)
AST: 19 U/L (ref 15–41)
Albumin: 3.9 g/dL (ref 3.5–5.0)
Alkaline Phosphatase: 48 U/L (ref 38–126)
Anion gap: 9 (ref 5–15)
BUN: 13 mg/dL (ref 6–20)
CO2: 24 mmol/L (ref 22–32)
Calcium: 9.2 mg/dL (ref 8.9–10.3)
Chloride: 108 mmol/L (ref 101–111)
Creatinine, Ser: 0.78 mg/dL (ref 0.44–1.00)
GFR calc Af Amer: >60 mL/min (ref >60)
GFR calc non Af Amer: >60 mL/min (ref >60)
Glucose, Bld: 87 mg/dL (ref 65–99)
Potassium: 4.3 mmol/L (ref 3.5–5.1)
Sodium: 141 mmol/L (ref 135–145)
Total Bilirubin: 0.9 mg/dL (ref 0.3–1.2)
Total Protein: 6.8 g/dL (ref 6.5–8.1)

## 2017-05-30 NOTE — Progress Notes (Signed)
Pt given Ensure and instructed to drink by 0400 day of surgery.

## 2017-06-06 ENCOUNTER — Encounter: Admitting: Genetics

## 2017-06-12 ENCOUNTER — Other Ambulatory Visit: Payer: Self-pay

## 2017-06-12 ENCOUNTER — Encounter: Payer: Self-pay | Admitting: Emergency Medicine

## 2017-06-12 ENCOUNTER — Emergency Department
Admission: EM | Admit: 2017-06-12 | Discharge: 2017-06-12 | Disposition: A | Discharge status: Home / Self Care | Attending: Emergency Medicine | Admitting: Emergency Medicine

## 2017-06-12 DIAGNOSIS — I2510 Atherosclerotic heart disease of native coronary artery without angina pectoris: Secondary | ICD-10-CM | POA: Diagnosis not present

## 2017-06-12 DIAGNOSIS — R112 Nausea with vomiting, unspecified: Secondary | ICD-10-CM | POA: Diagnosis not present

## 2017-06-12 DIAGNOSIS — Z79899 Other long term (current) drug therapy: Secondary | ICD-10-CM | POA: Diagnosis not present

## 2017-06-12 DIAGNOSIS — C50212 Malignant neoplasm of upper-inner quadrant of left female breast: Secondary | ICD-10-CM | POA: Diagnosis not present

## 2017-06-12 DIAGNOSIS — R197 Diarrhea, unspecified: Secondary | ICD-10-CM | POA: Insufficient documentation

## 2017-06-12 DIAGNOSIS — F419 Anxiety disorder, unspecified: Secondary | ICD-10-CM | POA: Diagnosis not present

## 2017-06-12 DIAGNOSIS — Z170 Estrogen receptor positive status [ER+]: Secondary | ICD-10-CM | POA: Diagnosis not present

## 2017-06-12 DIAGNOSIS — Z87891 Personal history of nicotine dependence: Secondary | ICD-10-CM | POA: Insufficient documentation

## 2017-06-12 HISTORY — DX: Malignant (primary) neoplasm, unspecified: C80.1

## 2017-06-12 LAB — URINALYSIS, COMPLETE (UACMP) WITH MICROSCOPIC
Bacteria, UA: NONE SEEN
Bilirubin Urine: NEGATIVE
Glucose, UA: NEGATIVE mg/dL
Hgb urine dipstick: NEGATIVE
Ketones, ur: NEGATIVE mg/dL
Leukocytes, UA: NEGATIVE
Nitrite: NEGATIVE
Protein, ur: NEGATIVE mg/dL
Specific Gravity, Urine: 1.025 (ref 1.005–1.030)
pH: 5 (ref 5.0–8.0)

## 2017-06-12 LAB — COMPREHENSIVE METABOLIC PANEL
ALT: 20 U/L (ref 14–54)
AST: 33 U/L (ref 15–41)
Albumin: 4.1 g/dL (ref 3.5–5.0)
Alkaline Phosphatase: 49 U/L (ref 38–126)
Anion gap: 11 (ref 5–15)
BUN: 17 mg/dL (ref 6–20)
CO2: 20 mmol/L — ABNORMAL LOW (ref 22–32)
Calcium: 8.6 mg/dL — ABNORMAL LOW (ref 8.9–10.3)
Chloride: 108 mmol/L (ref 101–111)
Creatinine, Ser: 0.67 mg/dL (ref 0.44–1.00)
GFR calc Af Amer: >60 mL/min (ref >60)
GFR calc non Af Amer: >60 mL/min (ref >60)
Glucose, Bld: 128 mg/dL — ABNORMAL HIGH (ref 65–99)
Potassium: 3 mmol/L — ABNORMAL LOW (ref 3.5–5.1)
Sodium: 139 mmol/L (ref 135–145)
Total Bilirubin: 0.8 mg/dL (ref 0.3–1.2)
Total Protein: 7.6 g/dL (ref 6.5–8.1)

## 2017-06-12 LAB — CBC
HCT: 43.9 % (ref 35.0–47.0)
Hemoglobin: 14.7 g/dL (ref 12.0–16.0)
MCH: 30.6 pg (ref 26.0–34.0)
MCHC: 33.5 g/dL (ref 32.0–36.0)
MCV: 91.4 fL (ref 80.0–100.0)
Platelets: 136 10*3/uL — ABNORMAL LOW (ref 150–440)
RBC: 4.8 MIL/uL (ref 3.80–5.20)
RDW: 13.9 % (ref 11.5–14.5)
WBC: 5.1 10*3/uL (ref 3.6–11.0)

## 2017-06-12 LAB — LIPASE, BLOOD: Lipase: 24 U/L (ref 11–51)

## 2017-06-12 MED ORDER — CIPROFLOXACIN HCL 500 MG PO TABS
750.0000 mg | ORAL_TABLET | Freq: Once | ORAL | Status: AC
  Administered 2017-06-12: 750 mg via ORAL
  Filled 2017-06-12: qty 2

## 2017-06-12 MED ORDER — ONDANSETRON HCL 4 MG/2ML IJ SOLN
4.0000 mg | Freq: Once | INTRAMUSCULAR | Status: AC
Start: 2017-06-12 — End: 2017-06-12
  Administered 2017-06-12: 4 mg via INTRAVENOUS
  Filled 2017-06-12: qty 2

## 2017-06-12 MED ORDER — LOPERAMIDE HCL 2 MG PO CAPS
4.0000 mg | ORAL_CAPSULE | Freq: Once | ORAL | Status: AC
  Administered 2017-06-12: 4 mg via ORAL
  Filled 2017-06-12: qty 2

## 2017-06-12 MED ORDER — ONDANSETRON 4 MG PO TBDP: 4.0000 mg | ORAL_TABLET | Freq: Three times a day (TID) | ORAL | 0 refills | Status: DC | PRN

## 2017-06-12 MED ORDER — SODIUM CHLORIDE 0.9 % IV BOLUS (SEPSIS)
1000.0000 mL | Freq: Once | INTRAVENOUS | Status: AC
  Administered 2017-06-12: 1000 mL via INTRAVENOUS

## 2017-06-12 NOTE — ED Notes (Signed)
Patient reports symptoms started the last night of cruise.  Reports another family member with similar symptoms.  Reports nausea, vomiting and diarrhea.  Reports last vomited in lobby waiting for room.

## 2017-06-12 NOTE — Discharge Instructions (Signed)
Please make sure you remain well-hydrated and follow-up with your oncologist this coming week as scheduled.  Return to the emergency department for any concerns whatsoever.  It was a pleasure to take care of you today, and thank you for coming to our emergency department.  If you have any questions or concerns before leaving please ask the nurse to grab me and I'm more than happy to go through your aftercare instructions again.  If you were prescribed any opioid pain medication today such as Norco, Vicodin, Percocet, morphine, hydrocodone, or oxycodone please make sure you do not drive when you are taking this medication as it can alter your ability to drive safely.  If you have any concerns once you are home that you are not improving or are in fact getting worse before you can make it to your follow-up appointment, please do not hesitate to call 911 and come back for further evaluation.  Darel Hong, MD  Results for orders placed or performed during the hospital encounter of 06/12/17  Lipase, blood  Result Value Ref Range   Lipase 24 11 - 51 U/L  Comprehensive metabolic panel  Result Value Ref Range   Sodium 139 135 - 145 mmol/L   Potassium 3.0 (L) 3.5 - 5.1 mmol/L   Chloride 108 101 - 111 mmol/L   CO2 20 (L) 22 - 32 mmol/L   Glucose, Bld 128 (H) 65 - 99 mg/dL   BUN 17 6 - 20 mg/dL   Creatinine, Ser 0.67 0.44 - 1.00 mg/dL   Calcium 8.6 (L) 8.9 - 10.3 mg/dL   Total Protein 7.6 6.5 - 8.1 g/dL   Albumin 4.1 3.5 - 5.0 g/dL   AST 33 15 - 41 U/L   ALT 20 14 - 54 U/L   Alkaline Phosphatase 49 38 - 126 U/L   Total Bilirubin 0.8 0.3 - 1.2 mg/dL   GFR calc non Af Amer >60 >60 mL/min   GFR calc Af Amer >60 >60 mL/min   Anion gap 11 5 - 15  CBC  Result Value Ref Range   WBC 5.1 3.6 - 11.0 K/uL   RBC 4.80 3.80 - 5.20 MIL/uL   Hemoglobin 14.7 12.0 - 16.0 g/dL   HCT 43.9 35.0 - 47.0 %   MCV 91.4 80.0 - 100.0 fL   MCH 30.6 26.0 - 34.0 pg   MCHC 33.5 32.0 - 36.0 g/dL   RDW 13.9 11.5 -  14.5 %   Platelets 136 (L) 150 - 440 K/uL  Urinalysis, Complete w Microscopic  Result Value Ref Range   Color, Urine YELLOW (A) YELLOW   APPearance HAZY (A) CLEAR   Specific Gravity, Urine 1.025 1.005 - 1.030   pH 5.0 5.0 - 8.0   Glucose, UA NEGATIVE NEGATIVE mg/dL   Hgb urine dipstick NEGATIVE NEGATIVE   Bilirubin Urine NEGATIVE NEGATIVE   Ketones, ur NEGATIVE NEGATIVE mg/dL   Protein, ur NEGATIVE NEGATIVE mg/dL   Nitrite NEGATIVE NEGATIVE   Leukocytes, UA NEGATIVE NEGATIVE   RBC / HPF 0-5 0 - 5 RBC/hpf   WBC, UA 0-5 0 - 5 WBC/hpf   Bacteria, UA NONE SEEN NONE SEEN   Squamous Epithelial / LPF 0-5 (A) NONE SEEN   Mucus PRESENT

## 2017-06-12 NOTE — ED Provider Notes (Signed)
[facility] Emergency Department Provider Note  ____________________________________________   First MD Initiated Contact with Patient 06/12/17 1957     (approximate)  I have reviewed the triage vital signs and the nursing notes.   HISTORY  Chief Complaint Emesis and Diarrhea    HPI Courtney Romero is a 57 y.o. female who self presents to the emergency department with abdominal cramping nausea vomiting and diarrhea that began earlier today.  Her symptoms had sudden onset are intermittent currently moderate severity.  Somewhat worse when trying to eat somewhat improved when not.  She got off of a cruise ship this morning that was in [place].  Several friends who ate the fruit salad this morning or also sick.  She feels like her symptoms are slowly improving but she is concerned because she has breast cancer and is scheduled for surgery in 2 days.  Past Medical History:  Diagnosis Date  . Abnormal CAT scan 2013  . Anxiety   . Cancer ([place])   . GERD (gastroesophageal reflux disease)   . Hepatitis C 2001   Followed by Dr. Charlean Sanfilippo at [facility]  . PONV (postoperative nausea and vomiting)     Patient Active Problem List   Diagnosis Date Noted  . Aortic atherosclerosis ([place]) 04/28/2017  . Malignant neoplasm of upper-inner quadrant of left breast in female, estrogen receptor positive ([place]) 04/27/2017  . Hepatitis C, chronic ([place]) 09/21/2016  . Health care maintenance 07/29/2015  . Menopausal disorder 01/20/2014  . 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 Rectal pain 05/31/2013  . Hemorrhoids 05/31/2013  . Rectal fissure 11/20/2012  . 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 Bloating 10/03/2012  . Nausea 10/03/2012  . Anal pain 05/09/2012  . Anxiety and depression 04/04/2012  . Diverticulosis 03/21/2012  . HEPATITIS C 01/08/2009  . SHORTNESS OF BREATH 01/08/2009    Past Surgical History:  Procedure Laterality Date  . ABDOMINAL HYSTERECTOMY  2009    . breast cyst removal  1995  . BREAST LUMPECTOMY    . COLONOSCOPY  2011  . FLEXIBLE SIGMOIDOSCOPY  February 12, 2013   have normal perianal exam, question focal prolapse. 2 small scars in the rectum.  Marland Kitchen HEMORRHOID BANDING  2012   3 times over three months  . SIGMOIDOSCOPY  2013  . TONSILECTOMY/ADENOIDECTOMY WITH MYRINGOTOMY  remote  . UPPER GI ENDOSCOPY  2013    Prior to Admission medications   Medication Sig Start Date End Date Taking? Authorizing Provider  LORazepam (ATIVAN) 0.5 MG tablet Take 1 mg by mouth as needed.     [provider]  omeprazole (PRILOSEC) 40 MG capsule Take 40 mg by mouth daily.    [provider]  ondansetron (ZOFRAN ODT) 4 MG disintegrating tablet Take 1 tablet (4 mg total) by mouth every 8 (eight) hours as needed for nausea or vomiting. 06/12/17   Darel Hong, MD  zolpidem (AMBIEN) 5 MG tablet Take by mouth as needed.  02/06/15 05/19/17  [provider]    Allergies Propoxyphene; Thioridazine hcl; Bupropion; Citalopram; Duloxetine; Thioridazine; and Venlafaxine  Family History  Problem Relation Age of Onset  . Hypertension Mother   . Thyroid disease Mother        Multi problems  . Coronary artery disease Father   . Heart failure Father   . Atrial fibrillation Father   . Diabetes Brother     Social History Social History   Tobacco Use  . Smoking status: Former Smoker    Packs/day: 1.50  Years: 20.00    Pack years: 30.00    Types: Cigarettes    Last attempt to quit: 04/19/1998    Years since quitting: 19.1  . Smokeless tobacco: Never Used  . Tobacco comment: quit smoking in 2000  Substance Use Topics  . Alcohol use: No  . Drug use: No    Review of Systems Constitutional: No fever/chills Eyes: No visual changes. ENT: No sore throat. Cardiovascular: Denies chest pain. Respiratory: Denies shortness of breath. Gastrointestinal: Positive for abdominal pain.  Positive for nausea, positive for vomiting.   Positive for diarrhea.  No constipation. Genitourinary: Negative for dysuria. Musculoskeletal: Negative for back pain. Skin: Negative for rash. Neurological: Negative for headaches, focal weakness or numbness.   ____________________________________________   PHYSICAL EXAM:  VITAL SIGNS: ED Triage Vitals [06/12/17 1910]  Enc Vitals Group     BP (!) 151/93     Pulse Rate (!) 119     Resp 20     Temp 98.4 F (36.9 C)     Temp Source Oral     SpO2 98 %     Weight      Height      Head Circumference      Peak Flow      Pain Score      Pain Loc      Pain Edu?      Excl. in [place]?     Constitutional: Alert and oriented x4 anxious appearing nontoxic no diaphoresis speaks full clear sentences Eyes: PERRL EOMI. Head: Atraumatic. Nose: No congestion/rhinnorhea. Mouth/Throat: No trismus Neck: No stridor.   Cardiovascular: Tachycardic rate, regular rhythm. Grossly normal heart sounds.  Good peripheral circulation. Respiratory: Normal respiratory effort.  No retractions. Lungs CTAB and moving good air Gastrointestinal: Soft mild diffuse tenderness with no focality no rebound no guarding no peritonitis Musculoskeletal: No lower extremity edema   Neurologic:  Normal speech and language. No gross focal neurologic deficits are appreciated. Skin:  Skin is warm, dry and intact. No rash noted. Psychiatric: Anxious appearing    ____________________________________________   DIFFERENTIAL includes but not limited to  Viral gastroenteritis, bacterial gastroenteritis, dehydration ____________________________________________   LABS (all labs ordered are listed, but only abnormal results are displayed)  Labs Reviewed  COMPREHENSIVE METABOLIC PANEL - Abnormal; Notable for the following components:      Result Value   Potassium 3.0 (*)    CO2 20 (*)    Glucose, Bld 128 (*)    Calcium 8.6 (*)    All other components within normal limits  CBC - Abnormal; Notable for the following  components:   Platelets 136 (*)    All other components within normal limits  URINALYSIS, COMPLETE (UACMP) WITH MICROSCOPIC - Abnormal; Notable for the following components:   Color, Urine YELLOW (*)    APPearance HAZY (*)    Squamous Epithelial / LPF 0-5 (*)    All other components within normal limits  LIPASE, BLOOD    Lab work reviewed by me with slight dehydration __________________________________________  EKG   ____________________________________________  RADIOLOGY   ____________________________________________   PROCEDURES  Procedure(s) performed: yes  Angiocath insertion Performed by: Darel Hong  Consent: Verbal consent obtained. Risks and benefits: risks, benefits and alternatives were discussed Time out: Immediately prior to procedure a "time out" was called to verify the correct patient, procedure, equipment, support staff and site/side marked as required.  Preparation: Patient was prepped and draped in the usual sterile fashion.  Vein Location: left AC  Ultrasound Guided  Gauge: 20  Normal blood return and flush without difficulty Patient tolerance: Patient tolerated the procedure well with no immediate complications.     Procedures  Critical Care performed: no  Observation: no ____________________________________________   INITIAL IMPRESSION / ASSESSMENT AND PLAN / ED COURSE  Pertinent labs & imaging results that were available during my care of the patient were reviewed by me and considered in my medical decision making (see chart for details).  The patient arrives relatively well although quite anxious appearing.  She clearly has viral versus bacterial gastroenteritis but in the setting of multiple sick contacts on a cruise ship it most likely is viral.  Regardless she has scheduled surgery for breast cancer in 2 days so I do think it is reasonable to treat her with a single dose of ciprofloxacin.  She is somewhat dehydrated so 1 L of  fluid given with improvement in her symptoms.  She is discharged home with strict return precautions verbalized understanding and agree with plan.      ____________________________________________   FINAL CLINICAL IMPRESSION(S) / ED DIAGNOSES  Final diagnoses:  Nausea vomiting and diarrhea      NEW MEDICATIONS STARTED DURING THIS VISIT:  Discharge Medication List as of 06/12/2017  8:51 PM    START taking these medications   Details  ondansetron (ZOFRAN ODT) 4 MG disintegrating tablet Take 1 tablet (4 mg total) by mouth every 8 (eight) hours as needed for nausea or vomiting., Starting Sun 06/12/2017, Print         Note:  This document was prepared using Dragon voice recognition software and may include unintentional dictation errors.     Darel Hong, MD 06/13/17 2232

## 2017-06-12 NOTE — ED Triage Notes (Signed)
Patient returned home today from cruise to [place].  She and family member have had n/v/d today and feels she ate something to make her sick.  Pt in NAD, but pulse is 119 in triage,

## 2017-06-13 ENCOUNTER — Ambulatory Visit
Admission: RE | Admit: 2017-06-13 | Discharge: 2017-06-13 | Disposition: A | Discharge status: Home / Self Care | Source: Ambulatory Visit | Attending: Surgery | Admitting: Surgery

## 2017-06-13 DIAGNOSIS — C50912 Malignant neoplasm of unspecified site of left female breast: Secondary | ICD-10-CM

## 2017-06-13 DIAGNOSIS — Z170 Estrogen receptor positive status [ER+]: Principal | ICD-10-CM

## 2017-06-14 ENCOUNTER — Encounter (HOSPITAL_COMMUNITY)
Admission: RE | Admit: 2017-06-14 | Discharge: 2017-06-14 | Disposition: A | Discharge status: Home / Self Care | Source: Ambulatory Visit | Attending: Surgery | Admitting: Surgery

## 2017-06-14 ENCOUNTER — Ambulatory Visit (HOSPITAL_BASED_OUTPATIENT_CLINIC_OR_DEPARTMENT_OTHER): Admitting: Anesthesiology

## 2017-06-14 ENCOUNTER — Other Ambulatory Visit: Payer: Self-pay

## 2017-06-14 ENCOUNTER — Encounter (HOSPITAL_BASED_OUTPATIENT_CLINIC_OR_DEPARTMENT_OTHER)
Admission: RE | Disposition: A | Discharge status: Home / Self Care | Payer: Self-pay | Source: Ambulatory Visit | Attending: Surgery

## 2017-06-14 ENCOUNTER — Ambulatory Visit
Admission: RE | Admit: 2017-06-14 | Discharge: 2017-06-14 | Disposition: A | Discharge status: Home / Self Care | Source: Ambulatory Visit | Attending: Surgery | Admitting: Surgery

## 2017-06-14 ENCOUNTER — Ambulatory Visit (HOSPITAL_BASED_OUTPATIENT_CLINIC_OR_DEPARTMENT_OTHER)
Admission: RE | Admit: 2017-06-14 | Discharge: 2017-06-14 | Disposition: A | Discharge status: Home / Self Care | Source: Ambulatory Visit | Attending: Surgery | Admitting: Surgery

## 2017-06-14 ENCOUNTER — Encounter (HOSPITAL_BASED_OUTPATIENT_CLINIC_OR_DEPARTMENT_OTHER): Payer: Self-pay

## 2017-06-14 DIAGNOSIS — Z79899 Other long term (current) drug therapy: Secondary | ICD-10-CM | POA: Insufficient documentation

## 2017-06-14 DIAGNOSIS — C50912 Malignant neoplasm of unspecified site of left female breast: Secondary | ICD-10-CM

## 2017-06-14 DIAGNOSIS — Z170 Estrogen receptor positive status [ER+]: Principal | ICD-10-CM

## 2017-06-14 DIAGNOSIS — Z803 Family history of malignant neoplasm of breast: Secondary | ICD-10-CM | POA: Insufficient documentation

## 2017-06-14 DIAGNOSIS — N6022 Fibroadenosis of left breast: Secondary | ICD-10-CM | POA: Diagnosis not present

## 2017-06-14 DIAGNOSIS — K219 Gastro-esophageal reflux disease without esophagitis: Secondary | ICD-10-CM | POA: Diagnosis not present

## 2017-06-14 DIAGNOSIS — F419 Anxiety disorder, unspecified: Secondary | ICD-10-CM | POA: Insufficient documentation

## 2017-06-14 DIAGNOSIS — Z87891 Personal history of nicotine dependence: Secondary | ICD-10-CM | POA: Diagnosis not present

## 2017-06-14 DIAGNOSIS — I739 Peripheral vascular disease, unspecified: Secondary | ICD-10-CM | POA: Diagnosis not present

## 2017-06-14 DIAGNOSIS — D242 Benign neoplasm of left breast: Secondary | ICD-10-CM | POA: Diagnosis not present

## 2017-06-14 DIAGNOSIS — Z79810 Long term (current) use of selective estrogen receptor modulators (SERMs): Secondary | ICD-10-CM | POA: Insufficient documentation

## 2017-06-14 DIAGNOSIS — D0512 Intraductal carcinoma in situ of left breast: Secondary | ICD-10-CM | POA: Diagnosis not present

## 2017-06-14 HISTORY — DX: Nausea with vomiting, unspecified: R11.2

## 2017-06-14 HISTORY — PX: BREAST LUMPECTOMY WITH RADIOACTIVE SEED AND SENTINEL LYMPH NODE BIOPSY: SHX6550

## 2017-06-14 HISTORY — DX: Anxiety disorder, unspecified: F41.9

## 2017-06-14 HISTORY — DX: Nausea with vomiting, unspecified: Z98.890

## 2017-06-14 SURGERY — BREAST LUMPECTOMY WITH RADIOACTIVE SEED AND SENTINEL LYMPH NODE BIOPSY
Anesthesia: General | Site: Breast | Laterality: Left

## 2017-06-14 MED ORDER — BUPIVACAINE-EPINEPHRINE (PF) 0.5% -1:200000 IJ SOLN
INTRAMUSCULAR | Status: DC | PRN
  Administered 2017-06-14: 11 mL

## 2017-06-14 MED ORDER — GABAPENTIN 300 MG PO CAPS
300.0000 mg | ORAL_CAPSULE | ORAL | Status: AC
  Administered 2017-06-14: 300 mg via ORAL

## 2017-06-14 MED ORDER — LIDOCAINE HCL (CARDIAC) 20 MG/ML IV SOLN
INTRAVENOUS | Status: DC | PRN
  Administered 2017-06-14: 40 mg via INTRAVENOUS

## 2017-06-14 MED ORDER — CEFAZOLIN SODIUM-DEXTROSE 2-4 GM/100ML-% IV SOLN
INTRAVENOUS | Status: AC
  Filled 2017-06-14: qty 100

## 2017-06-14 MED ORDER — 0.9 % SODIUM CHLORIDE (POUR BTL) OPTIME
TOPICAL | Status: DC | PRN
  Administered 2017-06-14: 400 mL

## 2017-06-14 MED ORDER — FENTANYL CITRATE (PF) 100 MCG/2ML IJ SOLN
25.0000 ug | INTRAMUSCULAR | Status: DC | PRN
Start: 2017-06-14 — End: 2017-06-14
  Administered 2017-06-14: 25 ug via INTRAVENOUS

## 2017-06-14 MED ORDER — DEXAMETHASONE SODIUM PHOSPHATE 10 MG/ML IJ SOLN
INTRAMUSCULAR | Status: AC
  Filled 2017-06-14: qty 1

## 2017-06-14 MED ORDER — LACTATED RINGERS IV SOLN
INTRAVENOUS | Status: DC
  Administered 2017-06-14 (×3): via INTRAVENOUS

## 2017-06-14 MED ORDER — SCOPOLAMINE 1 MG/3DAYS TD PT72
1.0000 | MEDICATED_PATCH | Freq: Once | TRANSDERMAL | Status: DC | PRN
  Administered 2017-06-14: 1.5 mg via TRANSDERMAL

## 2017-06-14 MED ORDER — PROPOFOL 10 MG/ML IV BOLUS
INTRAVENOUS | Status: AC
  Filled 2017-06-14: qty 20

## 2017-06-14 MED ORDER — DEXAMETHASONE SODIUM PHOSPHATE 4 MG/ML IJ SOLN
INTRAMUSCULAR | Status: DC | PRN
  Administered 2017-06-14: 10 mg via INTRAVENOUS

## 2017-06-14 MED ORDER — MIDAZOLAM HCL 2 MG/2ML IJ SOLN
INTRAMUSCULAR | Status: AC
  Filled 2017-06-14: qty 2

## 2017-06-14 MED ORDER — SODIUM CHLORIDE 0.9 % IJ SOLN
INTRAMUSCULAR | Status: AC
  Filled 2017-06-14: qty 20

## 2017-06-14 MED ORDER — CELECOXIB 200 MG PO CAPS
ORAL_CAPSULE | ORAL | Status: AC
  Filled 2017-06-14: qty 1

## 2017-06-14 MED ORDER — CHLORHEXIDINE GLUCONATE CLOTH 2 % EX PADS: 6.0000 | MEDICATED_PAD | Freq: Once | CUTANEOUS | Status: DC

## 2017-06-14 MED ORDER — ARTIFICIAL TEARS OPHTHALMIC OINT
TOPICAL_OINTMENT | OPHTHALMIC | Status: AC
  Filled 2017-06-14: qty 3.5

## 2017-06-14 MED ORDER — FENTANYL CITRATE (PF) 100 MCG/2ML IJ SOLN
50.0000 ug | INTRAMUSCULAR | Status: DC | PRN
  Administered 2017-06-14 (×2): 50 ug via INTRAVENOUS

## 2017-06-14 MED ORDER — TECHNETIUM TC 99M SULFUR COLLOID FILTERED
1.0000 | Freq: Once | INTRAVENOUS | Status: AC | PRN
  Administered 2017-06-14: 1 via INTRADERMAL

## 2017-06-14 MED ORDER — GABAPENTIN 300 MG PO CAPS
ORAL_CAPSULE | ORAL | Status: AC
  Filled 2017-06-14: qty 1

## 2017-06-14 MED ORDER — ONDANSETRON HCL 4 MG/2ML IJ SOLN
INTRAMUSCULAR | Status: AC
  Filled 2017-06-14: qty 2

## 2017-06-14 MED ORDER — FENTANYL CITRATE (PF) 100 MCG/2ML IJ SOLN
INTRAMUSCULAR | Status: AC
  Filled 2017-06-14: qty 2

## 2017-06-14 MED ORDER — ACETAMINOPHEN 500 MG PO TABS
ORAL_TABLET | ORAL | Status: AC
  Filled 2017-06-14: qty 2

## 2017-06-14 MED ORDER — PROPOFOL 500 MG/50ML IV EMUL
INTRAVENOUS | Status: DC | PRN
  Administered 2017-06-14: 75 ug/kg/min via INTRAVENOUS

## 2017-06-14 MED ORDER — PROPOFOL 10 MG/ML IV BOLUS
INTRAVENOUS | Status: DC | PRN
  Administered 2017-06-14 (×2): 50 mg via INTRAVENOUS
  Administered 2017-06-14: 140 mg via INTRAVENOUS

## 2017-06-14 MED ORDER — CEFAZOLIN SODIUM 1 G IJ SOLR
INTRAMUSCULAR | Status: AC
  Filled 2017-06-14: qty 20

## 2017-06-14 MED ORDER — CEFAZOLIN SODIUM-DEXTROSE 2-4 GM/100ML-% IV SOLN
2.0000 g | INTRAVENOUS | Status: AC
  Administered 2017-06-14: 2 g via INTRAVENOUS

## 2017-06-14 MED ORDER — SODIUM CHLORIDE 0.9 % IJ SOLN
INTRAMUSCULAR | Status: AC
  Filled 2017-06-14: qty 10

## 2017-06-14 MED ORDER — BUPIVACAINE-EPINEPHRINE (PF) 0.5% -1:200000 IJ SOLN
INTRAMUSCULAR | Status: AC
  Filled 2017-06-14: qty 30

## 2017-06-14 MED ORDER — OXYCODONE HCL 5 MG PO TABS: 5.0000 mg | ORAL_TABLET | Freq: Four times a day (QID) | ORAL | 0 refills | Status: DC | PRN

## 2017-06-14 MED ORDER — CELECOXIB 200 MG PO CAPS
200.0000 mg | ORAL_CAPSULE | ORAL | Status: AC
  Administered 2017-06-14: 200 mg via ORAL

## 2017-06-14 MED ORDER — PROPOFOL 10 MG/ML IV BOLUS
INTRAVENOUS | Status: AC
  Filled 2017-06-14: qty 40

## 2017-06-14 MED ORDER — MIDAZOLAM HCL 2 MG/2ML IJ SOLN
INTRAMUSCULAR | Status: DC | PRN
  Administered 2017-06-14: 1 mg via INTRAVENOUS

## 2017-06-14 MED ORDER — ACETAMINOPHEN 500 MG PO TABS
1000.0000 mg | ORAL_TABLET | ORAL | Status: AC
  Administered 2017-06-14: 1000 mg via ORAL

## 2017-06-14 MED ORDER — BUPIVACAINE-EPINEPHRINE (PF) 0.25% -1:200000 IJ SOLN: INTRAMUSCULAR | Status: DC | PRN

## 2017-06-14 MED ORDER — MIDAZOLAM HCL 2 MG/2ML IJ SOLN
1.0000 mg | INTRAMUSCULAR | Status: DC | PRN
  Administered 2017-06-14: 2 mg via INTRAVENOUS

## 2017-06-14 MED ORDER — METHYLENE BLUE 0.5 % INJ SOLN
INTRAVENOUS | Status: AC
  Filled 2017-06-14: qty 10

## 2017-06-14 MED ORDER — ONDANSETRON HCL 4 MG/2ML IJ SOLN
INTRAMUSCULAR | Status: DC | PRN
  Administered 2017-06-14: 4 mg via INTRAVENOUS

## 2017-06-14 MED ORDER — LIDOCAINE 2% (20 MG/ML) 5 ML SYRINGE
INTRAMUSCULAR | Status: AC
  Filled 2017-06-14: qty 5

## 2017-06-14 MED ORDER — BUPIVACAINE-EPINEPHRINE (PF) 0.5% -1:200000 IJ SOLN
INTRAMUSCULAR | Status: DC | PRN
  Administered 2017-06-14: 25 mL via PERINEURAL

## 2017-06-14 SURGICAL SUPPLY — 58 items
ADH SKN CLS APL DERMABOND .7 (GAUZE/BANDAGES/DRESSINGS) ×1
APPLIER CLIP 9.375 MED OPEN (MISCELLANEOUS) ×3
APR CLP MED 9.3 20 MLT OPN (MISCELLANEOUS) ×1
BINDER BREAST LRG (GAUZE/BANDAGES/DRESSINGS) ×2 IMPLANT
BINDER BREAST MEDIUM (GAUZE/BANDAGES/DRESSINGS) IMPLANT
BINDER BREAST XLRG (GAUZE/BANDAGES/DRESSINGS) IMPLANT
BINDER BREAST XXLRG (GAUZE/BANDAGES/DRESSINGS) IMPLANT
BLADE SURG 15 STRL LF DISP TIS (BLADE) ×1 IMPLANT
BLADE SURG 15 STRL SS (BLADE) ×3
CANISTER SUC SOCK COL 7IN (MISCELLANEOUS) IMPLANT
CANISTER SUCT 1200ML W/VALVE (MISCELLANEOUS) ×3 IMPLANT
CHLORAPREP W/TINT 26ML (MISCELLANEOUS) ×3 IMPLANT
CLIP APPLIE 9.375 MED OPEN (MISCELLANEOUS) ×1 IMPLANT
COVER BACK TABLE 60X90IN (DRAPES) ×3 IMPLANT
COVER MAYO STAND STRL (DRAPES) ×3 IMPLANT
COVER PROBE W GEL 5X96 (DRAPES) ×3 IMPLANT
DECANTER SPIKE VIAL GLASS SM (MISCELLANEOUS) IMPLANT
DERMABOND ADVANCED (GAUZE/BANDAGES/DRESSINGS) ×2
DERMABOND ADVANCED .7 DNX12 (GAUZE/BANDAGES/DRESSINGS) ×1 IMPLANT
DEVICE DUBIN W/COMP PLATE 8390 (MISCELLANEOUS) ×3 IMPLANT
DRAPE LAPAROSCOPIC ABDOMINAL (DRAPES) ×3 IMPLANT
DRAPE UTILITY XL STRL (DRAPES) ×3 IMPLANT
ELECT COATED BLADE 2.86 ST (ELECTRODE) ×3 IMPLANT
ELECT REM PT RETURN 9FT ADLT (ELECTROSURGICAL) ×3
ELECTRODE REM PT RTRN 9FT ADLT (ELECTROSURGICAL) ×1 IMPLANT
GLOVE BIO SURGEON STRL SZ7 (GLOVE) ×4 IMPLANT
GLOVE BIOGEL PI IND STRL 7.0 (GLOVE) IMPLANT
GLOVE BIOGEL PI IND STRL 7.5 (GLOVE) IMPLANT
GLOVE BIOGEL PI IND STRL 8 (GLOVE) ×1 IMPLANT
GLOVE BIOGEL PI INDICATOR 7.0 (GLOVE) ×2
GLOVE BIOGEL PI INDICATOR 7.5 (GLOVE) ×2
GLOVE BIOGEL PI INDICATOR 8 (GLOVE) ×2
GLOVE ECLIPSE 8.0 STRL XLNG CF (GLOVE) ×3 IMPLANT
GLOVE SURG SS PI 7.5 STRL IVOR (GLOVE) ×2 IMPLANT
GOWN STRL REUS W/ TWL LRG LVL3 (GOWN DISPOSABLE) ×2 IMPLANT
GOWN STRL REUS W/ TWL XL LVL3 (GOWN DISPOSABLE) IMPLANT
GOWN STRL REUS W/TWL LRG LVL3 (GOWN DISPOSABLE) ×3
GOWN STRL REUS W/TWL XL LVL3 (GOWN DISPOSABLE) ×6
HEMOSTAT ARISTA ABSORB 3G PWDR (MISCELLANEOUS) IMPLANT
HEMOSTAT SNOW SURGICEL 2X4 (HEMOSTASIS) ×1 IMPLANT
KIT MARKER MARGIN INK (KITS) ×3 IMPLANT
NDL HYPO 25X1 1.5 SAFETY (NEEDLE) ×1 IMPLANT
NDL SAFETY ECLIPSE 18X1.5 (NEEDLE) IMPLANT
NEEDLE HYPO 18GX1.5 SHARP (NEEDLE)
NEEDLE HYPO 25X1 1.5 SAFETY (NEEDLE) ×3 IMPLANT
NS IRRIG 1000ML POUR BTL (IV SOLUTION) ×3 IMPLANT
PACK BASIN DAY SURGERY FS (CUSTOM PROCEDURE TRAY) ×3 IMPLANT
PENCIL BUTTON HOLSTER BLD 10FT (ELECTRODE) ×3 IMPLANT
SLEEVE SCD COMPRESS KNEE MED (MISCELLANEOUS) ×3 IMPLANT
SPONGE LAP 4X18 X RAY DECT (DISPOSABLE) ×3 IMPLANT
SUT MNCRL AB 4-0 PS2 18 (SUTURE) ×3 IMPLANT
SUT VICRYL 3-0 CR8 SH (SUTURE) ×3 IMPLANT
SYR CONTROL 10ML LL (SYRINGE) ×3 IMPLANT
TOWEL OR 17X24 6PK STRL BLUE (TOWEL DISPOSABLE) ×3 IMPLANT
TOWEL OR NON WOVEN STRL DISP B (DISPOSABLE) ×3 IMPLANT
TUBE CONNECTING 20'X1/4 (TUBING) ×1
TUBE CONNECTING 20X1/4 (TUBING) ×2 IMPLANT
YANKAUER SUCT BULB TIP NO VENT (SUCTIONS) ×3 IMPLANT

## 2017-06-14 NOTE — Progress Notes (Signed)
Assisted Dr. Gifford Shave with left, ultrasound guided, pectoralis block and nuc med tech with nuc med inj. Side rails up, monitors on throughout procedure. See vital signs in flow sheet. Tolerated Procedure well.

## 2017-06-14 NOTE — Discharge Instructions (Signed)
°Post Anesthesia Home Care Instructions ° °Activity: °Get plenty of rest for the remainder of the day. A responsible individual must stay with you for 24 hours following the procedure.  °For the next 24 hours, DO NOT: °-Drive a car °-Operate machinery °-Drink alcoholic beverages °-Take any medication unless instructed by your physician °-Make any legal decisions or sign important papers. ° °Meals: °Start with liquid foods such as gelatin or soup. Progress to regular foods as tolerated. Avoid greasy, spicy, heavy foods. If nausea and/or vomiting occur, drink only clear liquids until the nausea and/or vomiting subsides. Call your physician if vomiting continues. ° °Special Instructions/Symptoms: °Your throat may feel dry or sore from the anesthesia or the breathing tube placed in your throat during surgery. If this causes discomfort, gargle with warm salt water. The discomfort should disappear within 24 hours. ° °If you had a scopolamine patch placed behind your ear for the management of post- operative nausea and/or vomiting: ° °1. The medication in the patch is effective for 72 hours, after which it should be removed.  Wrap patch in a tissue and discard in the trash. Wash hands thoroughly with soap and water. °2. You may remove the patch earlier than 72 hours if you experience unpleasant side effects which may include dry mouth, dizziness or visual disturbances. °3. Avoid touching the patch. Wash your hands with soap and water after contact with the patch. °  ° ° ° ° °[facility] °Office Phone Number [phone] ° °BREAST BIOPSY/ PARTIAL MASTECTOMY: POST OP INSTRUCTIONS ° °Always review your discharge instruction sheet given to you by the facility where your surgery was performed. ° °IF YOU HAVE DISABILITY OR FAMILY LEAVE FORMS, YOU MUST BRING THEM TO THE OFFICE FOR PROCESSING.  DO NOT GIVE THEM TO YOUR DOCTOR. ° °1. A prescription for pain medication may be given to you upon discharge.  Take your  pain medication as prescribed, if needed.  If narcotic pain medicine is not needed, then you may take acetaminophen (Tylenol) or ibuprofen (Advil) as needed. °2. Take your usually prescribed medications unless otherwise directed °3. If you need a refill on your pain medication, please contact your pharmacy.  They will contact our office to request authorization.  Prescriptions will not be filled after 5pm or on week-ends. °4. You should eat very light the first 24 hours after surgery, such as soup, crackers, pudding, etc.  Resume your normal diet the day after surgery. °5. Most patients will experience some swelling and bruising in the breast.  Ice packs and a good support bra will help.  Swelling and bruising can take several days to resolve.  °6. It is common to experience some constipation if taking pain medication after surgery.  Increasing fluid intake and taking a stool softener will usually help or prevent this problem from occurring.  A mild laxative (Milk of Magnesia or Miralax) should be taken according to package directions if there are no bowel movements after 48 hours. °7. Unless discharge instructions indicate otherwise, you may remove your bandages 24-48 hours after surgery, and you may shower at that time.  You may have steri-strips (small skin tapes) in place directly over the incision.  These strips should be left on the skin for 7-10 days.  If your surgeon used skin glue on the incision, you may shower in 24 hours.  The glue will flake off over the next 2-3 weeks.  Any sutures or staples will be removed at the office during your follow-up visit. °  8. ACTIVITIES:  You may resume regular daily activities (gradually increasing) beginning the next day.  Wearing a good support bra or sports bra minimizes pain and swelling.  You may have sexual intercourse when it is comfortable. °a. You may drive when you no longer are taking prescription pain medication, you can comfortably wear a seatbelt, and you can  safely maneuver your car and apply brakes. °b. RETURN TO WORK:  ______________________________________________________________________________________ °9. You should see your doctor in the office for a follow-up appointment approximately two weeks after your surgery.  Your doctor’s nurse will typically make your follow-up appointment when she calls you with your pathology report.  Expect your pathology report 2-3 business days after your surgery.  You may call to check if you do not hear from us after three days. °10. OTHER INSTRUCTIONS: _______________________________________________________________________________________________ _____________________________________________________________________________________________________________________________________ °_____________________________________________________________________________________________________________________________________ °_____________________________________________________________________________________________________________________________________ ° °WHEN TO CALL YOUR DOCTOR: °1. Fever over 101.0 °2. Nausea and/or vomiting. °3. Extreme swelling or bruising. °4. Continued bleeding from incision. °5. Increased pain, redness, or drainage from the incision. ° °The clinic staff is available to answer your questions during regular business hours.  Please don’t hesitate to call and ask to speak to one of the nurses for clinical concerns.  If you have a medical emergency, go to the nearest emergency room or call 911.  A surgeon from [facility] is always on call at the hospital. ° °For further questions, please visit [email or web address]  °

## 2017-06-14 NOTE — Anesthesia Procedure Notes (Signed)
Procedure Name: LMA Insertion Date/Time: 06/14/2017 7:34 AM Performed by: Georgeanne Nim, CRNA Pre-anesthesia Checklist: Patient identified, Suction available, Emergency Drugs available, Patient being monitored and Timeout performed Patient Re-evaluated:Patient Re-evaluated prior to induction Oxygen Delivery Method: Circle system utilized Preoxygenation: Pre-oxygenation with 100% oxygen Induction Type: IV induction Ventilation: Mask ventilation without difficulty LMA: LMA inserted LMA Size: 4.0 Number of attempts: 1 Placement Confirmation: ETT inserted through vocal cords under direct vision,  positive ETCO2,  CO2 detector and breath sounds checked- equal and bilateral Tube secured with: Tape Dental Injury: Teeth and Oropharynx as per pre-operative assessment

## 2017-06-14 NOTE — H&P (Signed)
Courtney Romero  Location: [facility] Patient #: [id number] DOB: 06/11/1960 Married / Language: English / Race: White Female  History of Present Illness Courtney Romero A. Courtney Sweeten MD Patient words: Patient returns after MRI. She had 2 areas biopsied the right side which showed fibrocystic type changes without atypia. She would like to proceed with left breast lumpectomy and both areas were localized on MRI. Both areas are small one in the upper inner left breast and the second in the lower outer quadrant. Both were less than a centimeter in size.                    Diagnosis 1. Breast, right, needle core biopsy, lower outer - FIBROADENOMA, PARTIALLY FIBROTIC. - FIBROCYSTIC CHANGES WITH USUAL DUCTAL HYPERPLASIA, CALCIFICATIONS AND FOCAL SCLEROSING ADENOSIS. - NO EVIDENCE OF MALIGNANCY. 2. Breast, right, needle core biopsy, upper inner - FIBROADENOMA. - FIBROCYSTIC CHANGES WITH USUAL DUCTAL HYPERPLASIA AND CALCIFICATIONS. - NO EVIDENCE OF MALIGNANCY. Microscopic Comment 1. Called to [facility] on 05/06/17. (JDP:gt, 05/06/17) Courtney Laws MD Pathologist, Electronic Signature (Case signed 05/06/2017)          Recently diagnosed left breast cancer at 2 sites of the upper left breast, 11 o'clock and 12 o'clock axes.  History of right breast lumpectomy 7 years ago. Additional history of previous excisional biopsy of a high risk lesion (complex sclerosing lesion) within the lower outer quadrant of the left breast in 2011.  LABS: Not applicable  EXAM: BILATERAL BREAST MRI WITH AND WITHOUT CONTRAST  TECHNIQUE: Multiplanar, multisequence MR images of both breasts were obtained prior to and following the intravenous administration of 14 ml of MultiHance.  THREE-DIMENSIONAL MR IMAGE RENDERING ON INDEPENDENT WORKSTATION:  Three-dimensional MR images were rendered by post-processing of the original  MR data on an independent workstation. The three-dimensional MR images were interpreted, and findings are reported in the following complete MRI report for this study. Three dimensional images were evaluated at the independent DynaCad workstation  COMPARISON: Previous exams including diagnostic mammogram and ultrasound dated 04/04/2017.  FINDINGS: Breast composition: b. Scattered fibroglandular tissue.  Background parenchymal enhancement: Mild  Right breast: Suspicious enhancing mass within the upper inner quadrant of the right breast, at middle depth, measuring 7 mm, T2 hyperintense, with mixed enhancement kinetics including suspicious washout (series 6, image 65).  Additional similar suspicious enhancing mass within the lower outer quadrant of the right breast, at middle depth, measuring 8 mm, T2 hyperintense, with persistent enhancement kinetics (series 6, image 101).  Additional similar-appearing mass within the lower inner quadrant of the right breast, at anterior depth, measuring 5 mm, T2 hyperintense, with sub threshold enhancement kinetics (also series 6, image 101).  No additional enhancing masses, non mass enhancement or secondary signs of malignancy are identified within the right breast.  Left breast: Contiguous mass and non mass enhancement from the upper central left breast (11 o'clock axis) extending superior laterally to the upper-outer quadrant (12-1 o'clock axis), consistent with patient's recent biopsy-proven carcinomas, overall measuring 4 cm craniocaudal dimension and up to 1.5 cm greatest transverse dimension (series 6, images 48 through 70).  Associated biopsy clips are well-positioned at the most superior and inferior aspects of this mass and non mass enhancement.                           2. PROGNOSTIC INDICATORS Results: IMMUNOHISTOCHEMICAL AND MORPHOMETRIC ANALYSIS PERFORMED  MANUALLY Estrogen Receptor: 100%, POSITIVE, STRONG STAINING INTENSITY  Progesterone Receptor: 100%, POSITIVE, STRONG STAINING INTENSITY Proliferation Marker Ki67: 5% REFERENCE RANGE ESTROGEN RECEPTOR NEGATIVE 0% POSITIVE =>1% REFERENCE RANGE PROGESTERONE RECEPTOR NEGATIVE 0% POSITIVE =>1% All controls stained appropriately [name] Pathologist, Electronic Signature ( Signed 04/18/2017) 2. FLUORESCENCE IN-SITU HYBRIDIZATION Results: HER2 - NEGATIVE RATIO OF HER2/CEP17 SIGNALS 1.35 AVERAGE HER2 COPY NUMBER PER CELL 2.10 Reference Range: NEGATIVE HER2/CEP17 Ratio <2.0 and average HER2 copy number <4.0 EQUIVOCAL HER2/CEP17 Ratio <2.0 and average HER2 copy number >=4.0 and <6.0 1 of 3 FINAL for Courtney Romero ([id number]) ADDITIONAL INFORMATION:(continued) POSITIVE HER2/CEP17 Ratio >=2.0 or <2.0 and average HER2 copy number >=6.0 [name] Pathologist, Electronic Signature ( Signed 04/15/2017) FINAL DIAGNOSIS Diagnosis 1. 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 - SEE COMMENT. 2. 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 - SEE COMMENT. Microscopic Comment 1. & 2. Specimen #1 shows extensive low grade ductal carcinoma in situ, which is demonstrated with positive p63, smooth muscle myosin heavy chain, and calponin immunostains. Some of the lesion shows attenuation of the myoepithelial layer, and in other areas it is absent entirely. The evaluation of the overall size of the invasive component is problematic, but it appears to be at least 0.3 cm in maximal dimension, and may indeed be greater. The #2 specimen shows a 0.6 cm maximal dimension area of what appears to be primarily low grade ductal carcinoma in situ. An approximately 0.2 cm  in maximal dimension area of this process shows negative staining for calponin, smooth muscle myosin heavy chain, and p63 immunostaining, supporting the diagnosis of invasive carcinoma. Other areas of the process show attenuation, and so, as noted above, the actual invasive component may be larger than that measured here. Material will be taken for receptor studies, and the results will be reported in an addendum. (MJ:ah 04/14/17) [name] Pathologist, Electronic Signature (Case signed 04/14/2017) Specimen Gross and Clinical Information Specimen Comment 1. TIF: 9:20 AM; extracted < 5 min; calcs/distortion 2. TIF: 9:45 AM; extracted < 1 min; mass Specimen(s) Obtained: 1. Breast, left, needle core biopsy, 12:00 o'clock, 4cmfn 2. Breast, left, needle core biopsy, 11:00 o'clock 3cmfn There are no additional suspicious enhancing masses, non mass enhancement or secondary signs of malignancy within the left breast.  Lymph nodes: No abnormal appearing lymph nodes.  Ancillary findings: None.  IMPRESSION: 1. Two suspicious enhancing masses within the RIGHT breast, upper inner quadrant and lower outer quadrant, measuring 7 mm and 8 mm respectively, suspicious for contralateral disease. Locations/image numbers detailed above. Ultrasound-guided biopsies are recommended for these 2 masses. 2. Additional enhancing mass within the right breast, lower inner quadrant, at anterior depth, measuring 5 mm. This mass demonstrates sub threshold kinetics but is otherwise similar in appearance to the 2 additional masses within the right breast. 3. Biopsy-proven malignancy within the LEFT breast. The combination of mass and non mass enhancement measures 4 cm craniocaudal dimension and 1.5 cm greatest transverse dimension. Biopsy clips are well-positioned (bracketed) at the most superior and inferior aspects of this mass and non mass enhancement corresponding to the sites of biopsy-proven  malignancy. 4. No evidence of additional multifocal or multicentric disease within the left breast. 5. No evidence of metastatic lymphadenopathy.  RECOMMENDATION: 1. Ultrasound-guided biopsies of the 2 suspicious enhancing masses within the right breast, upper inner quadrant  and lower outer quadrant, both at middle depth, measuring 7 mm and 8 mm respectively (series 6, images 65 and 101 respectively). 2. If pathology results for the 2 MRI guided biopsies are both benign, the third similar appearing lesion within the anterior right breast can be also considered benign. If pathology results for 1 or both MRI guided biopsies are positive for malignancy, recommend second look targeted ultrasound and possible ultrasound-guided biopsy for this third lesion in the right breast (this lesion does not appear amenable to MRI guided biopsy due to its anterior/superficial location).  BI-RADS CATEGORY 4: Suspicious.  Electronically Signed: By: Franki Cabot M.D. On: 04/29/2017 11:28.  The patient is a 57 year old female.   Problem List/Past Medical Sharyn Lull R. Brooks, CMA;  MALIGNANT NEOPLASM OF OVERLAPPING SITES OF LEFT BREAST IN FEMALE, ESTROGEN RECEPTOR POSITIVE (C50.812) BREAST CANCER, LEFT (Y09.983)  Past Surgical History Sharyn Lull R. Rolena Infante, CMA; Breast Biopsy Left. Colon Polyp Removal - Colonoscopy Hysterectomy (not due to cancer) - Partial Tonsillectomy  Diagnostic Studies History Sharyn Lull R. Rolena Infante, CMA; Colonoscopy 5-10 years ago Mammogram within last year Pap Smear 1-5 years ago  Allergies Sharyn Lull R. Rolena Infante, CMA;  Propoxyphene Compound *ANALGESICS - OPIOID* Thioridazine HCl *CHEMICALS* BuPROPion HCl *ANTIDEPRESSANTS* Citalopram Hydrobromide *ANTIDEPRESSANTS* DULoxetine HCl *CHEMICALS* Venlafaxine HCl *ANTIDEPRESSANTS*  Medication History (Michelle R. Rolena Infante, CMA;  Cholecalciferol (1000UNIT Capsule, Oral) Active. LORazepam (0.5MG  Tablet, Oral) Active. Omeprazole (40MG Capsule DR, Oral) Active. Zolpidem Tartrate (5MG Tablet, Oral) Active. Tamoxifen Citrate (20MG Tablet, Oral) Active. Medications Reconciled  Social History Sharyn Lull R. Rolena Infante, CMA;  Caffeine use Coffee. No alcohol use No drug use Tobacco use Former smoker.  Family History Sharyn Lull R. Rolena Infante, CMA; Arthritis Mother. Breast Cancer Family Members In General. Colon Polyps Father, Mother. Depression Mother. Diabetes Mellitus Brother. Heart Disease Father. Heart disease in female family member before age 40 Hypertension Father, Mother. Malignant Neoplasm Of Pancreas Family Members In General. Prostate Cancer Father. Thyroid problems Father, Mother.  Pregnancy / Birth History Sharyn Lull R. Rolena Infante, CMA;  Age at menarche 22 years. Age of menopause 51-55 Contraceptive History Oral contraceptives. Gravida 0 Para 0  Other Problems (Michelle R. Rolena Infante, CMA;  Anxiety Disorder Diverticulosis Gastroesophageal Reflux Disease General anesthesia - complications Hemorrhoids Hepatitis    Vitals (Michelle R. Brooks CMA;  05/13/2017 10:42 AM Weight: 149.5 lb Height: 67in Body Surface Area: 1.79 m Body Mass Index: 23.41 kg/m  BP: 124/82 (Sitting, Left Arm, Standard)      Physical Exam (Peyton Rossner A. Geneieve Duell MD;   General Mental Status-Alert. General Appearance-Consistent with stated age. Hydration-Well hydrated. Voice-Normal.  Breast Note: Not reexamined today  Cardiovascular Cardiovascular examination reveals -on palpation PMI is normal in location and amplitude, no palpable S3 or S4. Normal cardiac borders., normal heart sounds, regular rate and rhythm with no murmurs, carotid auscultation reveals no bruits and normal pedal pulses bilaterally.  Neurologic Neurologic evaluation reveals -alert and oriented x 3 with no impairment of recent or remote memory. Mental  Status-Normal.  Musculoskeletal Normal Exam - Left-Upper Extremity Strength Normal and Lower Extremity Strength Normal. Normal Exam - Right-Upper Extremity Strength Normal, Lower Extremity Weakness.    Assessment & Plan (Akasha Melena A. Mahreen Schewe MD; MALIGNANT NEOPLASM OF OVERLAPPING SITES OF LEFT BREAST IN FEMALE, ESTROGEN RECEPTOR POSITIVE (C50.812) Impression: Patient has opted for left breast lumpectomy 2 with less than lift a mapping. Discussed the need for reexcision and possible mastectomy if this fails. She is now on tamoxifen in a neoadjuvant setting since she is taking decreased next month and does  not want to miss that which I think is fine. I discussed the procedure as well as possibility of mastectomy reconstruction on the right at this fails. I discussed reexcision rates with her. I discussed potential cosmetic issues. Risk of lumpectomy include bleeding, infection, seroma, more surgery, use of seed/wire, wound care, cosmetic deformity and the need for other treatments, death , blood clots, death. Pt agrees to proceed. Risk of sentinel lymph node mapping include bleeding, infection, lymphedema, shoulder pain. stiffness, dye allergy. cosmetic deformity , blood clots, death, need for more surgery. Pt agres to proceed.  Current Plans Pt Education - [facility] Breast Biopsy HCI: discussed with patient and provided information. Pt Education - ABC (After Breast Cancer) Class Info: discussed with patient and provided information. Pt Education - flb breast cancer surgery: discussed with patient and provided information. We discussed the staging and pathophysiology of breast cancer. We discussed all of the different options for treatment for breast cancer including surgery, chemotherapy, radiation therapy, Herceptin, and antiestrogen therapy. We discussed a sentinel lymph node biopsy as she does not appear to having lymph node involvement right now. We discussed the performance of that with  injection of radioactive tracer and blue dye. We discussed that she would have an incision underneath her axillary hairline. We discussed that there is a bout a 10-20% chance of having a positive node with a sentinel lymph node biopsy and we will await the permanent pathology to make any other first further decisions in terms of her treatment. One of these options might be to return to the operating room to perform an axillary lymph node dissection. We discussed about a 1-2% risk lifetime of chronic shoulder pain as well as lymphedema associated with a sentinel lymph node biopsy. We discussed the options for treatment of the breast cancer which included lumpectomy versus a mastectomy. We discussed the performance of the lumpectomy with a wire placement. We discussed a 10-20% chance of a positive margin requiring reexcision in the operating room. We also discussed that she may need radiation therapy or antiestrogen therapy or both if she undergoes lumpectomy. We discussed the mastectomy and the postoperative care for that as well. We discussed that there is no difference in her survival whether she undergoes lumpectomy with radiation therapy or antiestrogen therapy versus a mastectomy. There is a slight difference in the local recurrence rate being 3-5% with lumpectomy and about 1% with a mastectomy. We discussed the risks of operation including bleeding, infection, possible reoperation. She understands her further therapy will be based on what her stages at the time of her operation.

## 2017-06-14 NOTE — Interval H&P Note (Signed)
History and Physical Interval Note:  06/14/2017 7:07 AM  Courtney Romero  has presented today for surgery, with the diagnosis of LEFT BREAST CANCER  The various methods of treatment have been discussed with the patient and family. After consideration of risks, benefits and other options for treatment, the patient has consented to  Procedure(s): LEFT BREAST LUMPECTOMY X2 WITH RADIOACTIVE SEED AND SENTINEL LYMPH NODE BIOPSY (Left) as a surgical intervention .  The patient's history has been reviewed, patient examined, no change in status, stable for surgery.  I have reviewed the patient's chart and labs.  Questions were answered to the patient's satisfaction.     [place]

## 2017-06-14 NOTE — Anesthesia Preprocedure Evaluation (Addendum)
Anesthesia Evaluation  Patient identified by MRN, date of birth, ID band Patient awake    Reviewed: Allergy & Precautions, NPO status , Patient's Chart, lab work & pertinent test results  History of Anesthesia Complications (+) PONV and history of anesthetic complications  Airway Mallampati: II  TM Distance: >3 FB Neck ROM: Full    Dental  (+) Teeth Intact, Dental Advisory Given   Pulmonary shortness of breath, former smoker,    Pulmonary exam normal breath sounds clear to auscultation       Cardiovascular + Peripheral Vascular Disease  Normal cardiovascular exam Rhythm:Regular Rate:Normal     Neuro/Psych PSYCHIATRIC DISORDERS Anxiety negative neurological ROS     GI/Hepatic GERD  Medicated,(+) Hepatitis -, C  Endo/Other  negative endocrine ROS  Renal/GU negative Renal ROS     Musculoskeletal negative musculoskeletal ROS (+)   Abdominal   Peds  Hematology  (+) Blood dyscrasia (Thrombocytopenia), ,   Anesthesia Other Findings Day of surgery medications reviewed with the patient.  Reproductive/Obstetrics                             Anesthesia Physical Anesthesia Plan  ASA: III  Anesthesia Plan: General   Post-op Pain Management:  Regional for Post-op pain   Induction: Intravenous  PONV Risk Score and Plan: 4 or greater and Midazolam, Dexamethasone, Ondansetron, Scopolamine patch - Pre-op and TIVA  Airway Management Planned: LMA  Additional Equipment:   Intra-op Plan:   Post-operative Plan: Extubation in OR  Informed Consent: I have reviewed the patients History and Physical, chart, labs and discussed the procedure including the risks, benefits and alternatives for the proposed anesthesia with the patient or authorized representative who has indicated his/her understanding and acceptance.   Dental advisory given  Plan Discussed with: CRNA, Anesthesiologist and  Surgeon  Anesthesia Plan Comments: (Risks/benefits of general anesthesia discussed with patient including risk of damage to teeth, lips, gum, and tongue, nausea/vomiting, allergic reactions to medications, and the possibility of heart attack, stroke and death.  All patient questions answered.  Patient wishes to proceed.)       Anesthesia Quick Evaluation

## 2017-06-14 NOTE — Op Note (Signed)
Preoperative diagnosis: Stage I left breast cancer multifocal upper outer quadrant  Postoperative diagnosis: Same  Procedure: Left breast seed localized partial mastectomy with 2 seeds and left axillary sentinel lymph node mapping using technetium sulfur colloid for deep left axillary sentinel nodes  Surgeon: Erroll Luna MD  Anesthesia: LMA with local with pectoral block  EBL: 20 cc  Specimen: Left breast tissue with 2 seeds in 2 clips plus margin excision of the lumpectomy cavity and 3 left axillary sentinel nodes hot  Drains: None  IV fluids Per anesthesia record  Indications for procedure: The patient presents for left breast lumpectomy with sentinel lymph node mapping for multifocal stage I left breast cancer.  She was seen by medical and radiation oncology as well.  She desired to conserve her breast.The procedure has been discussed with the patient. Alternatives to surgery have been discussed with the patient.  Risks of surgery include bleeding,  Infection,  Seroma formation, death,  and the need for further surgery.   The patient understands and wishes to proceed.Sentinel lymph node mapping and dissection has been discussed with the patient.  Risk of bleeding,  Infection,  Seroma formation,  Additional procedures,,  Shoulder weakness ,  Shoulder stiffness,  Nerve and blood vessel injury and reaction to the mapping dyes have been discussed.  Alternatives to surgery have been discussed with the patient.  The patient agrees to proceed.    Description of procedure: The patient was met in the holding area.  Left breast was marked as the correct side.  She underwent injection by nuclear medicine for mapping of the left breast in the holding area.  She underwent pectoral block per anesthesia.  Questions were answered.  She was taken back to the operating room placed upon the OR table.  After induction of LMA anesthesia the left breast was prepped and draped in sterile fashion.  Timeout was  done to verify proper patient, location and procedure.  She received preoperative Ancef.  The neoprobe was used and both seeds were localized in the left breast upper outer quadrant.  There are approximately 2-3 cm apart.  The films were available for review.  Curvilinear incision was made in the upper breast.  Dissection was carried around both seeds and clip and all tissue around both were excised.  Radiograph revealed both clips in both seeds to be in the specimen.  I went ahead and excised all margins except the deep margin which was pectoralis muscle.  These were sent separately.  The wound was made hemostatic with cautery.  Irrigation was used.  This was closed with 3-0 Vicryl and 4-0 Monocryl.  Neoprobe settings were changed to technetium from iodine.  Hot spot identified in the left axilla marked with a marking pen.  A 3 cm incision was made in the left axilla.  Dissection was carried into the level 1 deep axillary node basin.  There were 3 sentinel nodes removed which were hot.  The long thoracic nerve, thoracodorsal trunk and x-ray vein were preserved.  These were removed and hemostasis achieved.  Background counts approaches 0.  Surgicel snow was placed in the cavity.  This was closed with 3-0 Vicryl and 4-0 Monocryl.  All final counts were found to be correct.  Dermabond applied.  Breast binder placed.  The patient was then awoke extubated taken to recovery in satisfactory condition.

## 2017-06-14 NOTE — Anesthesia Procedure Notes (Signed)
Anesthesia Regional Block: Pectoralis block   Pre-Anesthetic Checklist: ,, timeout performed, Correct Patient, Correct Site, Correct Laterality, Correct Procedure, Correct Position, site marked, Risks and benefits discussed,  Surgical consent,  Pre-op evaluation,  At surgeon's request and post-op pain management  Laterality: Left  Prep: chloraprep       Needles:  Injection technique: Single-shot  Needle Type: Echogenic Needle     Needle Length: 9cm  Needle Gauge: 21     Additional Needles:   Procedures:,,,, ultrasound used (permanent image in chart),,,,  Narrative:  Start time: 06/14/2017 6:59 AM End time: 06/14/2017 7:04 AM Injection made incrementally with aspirations every 5 mL.  Performed by: Personally  Anesthesiologist: Catalina Gravel, MD  Additional Notes: No pain on injection. No increased resistance to injection. Injection made in 5cc increments.  Good needle visualization.  Patient tolerated procedure well.

## 2017-06-14 NOTE — Transfer of Care (Signed)
Immediate Anesthesia Transfer of Care Note  Patient: Courtney Romero  Procedure(s) Performed: LEFT BREAST SEED LOCALIZED LUMPECTOMY (2 SEEDS) AND SENTINEL LYMPH NODE BIOPSY (Left Breast)  Patient Location: PACU  Anesthesia Type:General  Level of Consciousness: awake and patient cooperative  Airway & Oxygen Therapy: Patient Spontanous Breathing and Patient connected to face mask oxygen  Post-op Assessment: Report given to RN and Post -op Vital signs reviewed and stable  Post vital signs: Reviewed and stable  Last Vitals:  Vitals:   06/14/17 0720 06/14/17 0725  BP: 115/74   Pulse: 81 84  Resp: 18 14  Temp:    SpO2: 100% 100%    Last Pain:  Vitals:   06/14/17 2595  TempSrc: Oral         Complications: No apparent anesthesia complications

## 2017-06-14 NOTE — Anesthesia Postprocedure Evaluation (Signed)
Anesthesia Post Note  Patient: Courtney Romero  Procedure(s) Performed: LEFT BREAST SEED LOCALIZED LUMPECTOMY (2 SEEDS) AND SENTINEL LYMPH NODE BIOPSY (Left Breast)     Patient location during evaluation: PACU Anesthesia Type: General Level of consciousness: awake and alert Pain management: pain level controlled Vital Signs Assessment: post-procedure vital signs reviewed and stable Respiratory status: spontaneous breathing, nonlabored ventilation and respiratory function stable Cardiovascular status: blood pressure returned to baseline and stable Postop Assessment: no apparent nausea or vomiting Anesthetic complications: no    Last Vitals:  Vitals:   06/14/17 1015 06/14/17 1030  BP:  (!) 143/92  Pulse: 76 71  Resp:  16  Temp:  36.8 C  SpO2: 99% 99%    Last Pain:  Vitals:   06/14/17 1015  TempSrc:   PainSc: 4                  Catalina Gravel

## 2017-06-15 ENCOUNTER — Encounter (HOSPITAL_BASED_OUTPATIENT_CLINIC_OR_DEPARTMENT_OTHER): Payer: Self-pay | Admitting: Surgery

## 2017-06-15 NOTE — Addendum Note (Signed)
Addendum  created 06/15/17 1046 by Tawni Millers, CRNA   Charge Capture section accepted

## 2017-06-17 ENCOUNTER — Encounter: Payer: Self-pay | Admitting: Radiation Oncology

## 2017-06-22 NOTE — Progress Notes (Signed)
Location of Breast Cancer: Left Breast  Histology per Pathology Report: 04/08/17 Diagnosis 1. 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 - SEE COMMENT. 2. 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 - SEE COMMENT  2. Receptor Status: ER(100%), PR (100%), Her2-neu (NEG), Ki-(5%)  06/14/17 Diagnosis 1. Breast, lumpectomy, left with radioactive seed - DUCTAL CARCINOMA IN SITU, LOW NUCLEAR GRADE - PREVIOUS BIOPSY SITE CHANGES - MARGINS UNINVOLVED BY CARCINOMA (0.1 CM; MEDIAL) - SEE ONCOLOGY TABLE 2. Breast, excision, left additional superior margin - RESIDUAL DUCTAL CARCINOMA IN SITU, LOW GRADE - MARGINS UNINVOLVED BY CARCINOMA (0.3 CM) 3. Breast, excision, left additional inferior margin - USUAL DUCTAL HYPERPLASIA - INTRADUCTAL PAPILLOMA AND FIBROCYSTIC CHANGES - NO RESIDUAL CARCINOMA IDENTIFIED 4. Breast, excision, left additional medial margin - FOCAL FIBROCYSTIC CHANGES - NO RESIDUAL CARCINOMA IDENTIFIED 5. Breast, excision, left additional lateral margin - FIBROCYSTIC CHANGES AND ADENOSIS - NO RESIDUAL CARCINOMA IDENTIFIED 6. Breast, excision, left additional anterior margin - LOBULAR NEOPLASIA (ATYPICAL LOBULAR HYPERPLASIA) - NO RESIDUAL CARCINOMA IDENTIFIED 7. Lymph node, sentinel, biopsy, left axillary - NO CARCINOMA IDENTIFIED IN ONE LYMPH NODE (0/1) 8. Lymph node, sentinel, biopsy, left - NO CARCINOMA IDENTIFIED IN ONE LYMPH NODE (0/1) 9. Lymph node, sentinel, biopsy, left - NO CARCINOMA IDENTIFIED IN ONE LYMPH NODE (0/1)  Did patient present with symptoms or was this found on screening mammography?: It was found on a screening mammogram.   Past/Anticipated interventions by surgeon, if any: 06/14/17 Procedure: Left breast seed  localized partial mastectomy with 2 seeds and left axillary sentinel lymph node mapping using technetium sulfur colloid for deep left axillary sentinel nodes Surgeon: Erroll Luna MD   Past/Anticipated interventions by medical oncology, if any: Dr. Jana Hakim 06/23/17 She is tolerating the tamoxifen well and the plan will be to continue that for a total of 5 years.  This will not only reduce her risk of this cancer recurring, but it will cut in half her risk of developing another breast cancer in the future.  She is now ready for adjuvant radiation.  She has also been scheduled for genetics testing.  If she does carry a deleterious mutation she will return to see me and we will discuss that in the high risk clinic.  Otherwise she understands that we go back to yearly mammography with tomography which in her case will likely be in October.  I am going to see her again in June.  By then she should be recovering from her radiation.  She knows to call for any other issues that may develop before the next visit.    Lymphedema issues, if any: She denies. She does report numbness and skin tingling to her upper left arm.   Pain issues, if any: She denies  SAFETY ISSUES:  Prior radiation? No  Pacemaker/ICD? No  Possible current pregnancy? No  Is the patient on methotrexate? No  Current Complaints / other details:    BP 131/85   Pulse 79   Temp 98.1 F (36.7 C)   Ht 5' 7" (1.702 m)   Wt 149 lb (67.6 kg)   SpO2 100% Comment: room air  BMI 23.34 kg/m    Wt Readings from Last 3 Encounters:  06/28/17 149 lb (67.6 kg)  06/23/17 148 lb 11.2 oz (67.4 kg)  06/14/17 149  lb 2 oz (67.6 kg)     

## 2017-06-22 NOTE — Progress Notes (Signed)
[place]  [phone] [phone]     ID: Courtney Romero DOB: 1960/10/18  MR#: [id number]  SNK#:[id number]  Patient Care Team: [name], MD as PCP - General (Unknown Physician Specialty) Hilari Wethington, Virgie Dad, MD as Consulting Physician (Oncology) Erroll Luna, MD as Consulting Physician (General Surgery) Dermatology, Marta Antu, MD as Referring Physician (Obstetrics and Gynecology) OTHER MD:  CHIEF COMPLAINT: Estrogen receptor positive breast cancer  CURRENT TREATMENT: Adjuvant radiation pending   HISTORY OF CURRENT ILLNESS: From the original intake note:   The patient underwent bilateral screening mammography with tomography at the breast center 03/29/2017.  There were some calcifications associated with possible architectural distortion in the left breast.  She was recalled for left unilateral diagnostic mammography with ultrasonography 04/04/2017.  This found the breast density to be category C.  In the upper outer left breast there was a 1.2 cm group of coarse calcifications with persistent distortion.  This was not palpable on exam.  Ultrasound showed a 1.0 cm irregular mass in the left breast at the 11 o'clock position 3 cm from the nipple and also a 1.5 area of posterior acoustic shadowing at the 12 o'clock position 4 cm from the nipple.  The latter was felt to correspond to the area of suspicious calcifications.  Accordingly on 04/08/2017 the patient underwent biopsy of the 2 areas in question. 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  Both biopsy site appeared identical.  Only one prognostic panel was sent, from the 11:00 biopsy, showing estrogen epidural 100% positive, estrogen receptor 100% positive, both with strong staining intensity, with an MIB-105%, and no  HER-2 amplification, the signals ratio being 1.35 and the number per cell 2.10.  She underwent breast MRI 04/28/2017 showing, in the left breast, an area of mass and non-masslike enhancement overall measuring up to 4 cm.  In addition there were 3 separate lesions in the right breast suspicious for malignancy, requiring additional biopsy.   The patient's subsequent history is as detailed below.  INTERVAL HISTORY: Courtney Romero returns today for follow up and treatment of her estrogen receptor positive breast cancer accompanied by her husband. She notes that surgery went well. She has pain, some pulling, and a little swelling in her upper inner arm around the left axilla. She feels that it is numb and uncomfortable. She took tylenol for a couple of days after surgery.   She continues on tamoxifen, with good tolerance. She is having some hot flashes, but they are no more than what she was having before. She denies issues with increased vaginal discharge.   Since her last visit, she underwent left breast lumpectomy and sentinel lymph node sampling ([id number]) on 06/14/2017 with pathology showing: Ductal carcinoma in situ grade I, with negative margins. Residual ductal carcinoma in the left superior margin of the excision: inferior margin of the excision showed usual ductal hyperplasia. The medial and lateral margin of the excision showed fibrocystic changes with the lateral showing adenosis additionally. The anterior margin of the excision showed atypical lobular hyperplasia and no carcinoma. There was no evidence of carcinoma in 3 left axillary sentinel lymph nodes (0/3).   Her pathology results were discussed at the multidisciplinary breast cancer conference 06/22/2017.  At that time it was clear the patient did not need chemotherapy.  She will benefit from adjuvant radiation and continuing antiestrogens.  REVIEW OF SYSTEMS: Malissie reports that she went to the gym for the first time since surgery. She  notes that her mother is in hospice care in [place], and she is declining. She sees Dr. Brantley Stage next week. There, she will discuss physical therapy to increase her ROM in the left arm, which she has already been working on. She denies unusual headaches, visual changes, nausea, vomiting, or dizziness. There has been no unusual cough, phlegm production, or pleurisy. This been no change in bowel or bladder habits. She denies unexplained fatigue or unexplained weight loss, bleeding, rash, or fever. A detailed review of systems was otherwise stable.    PAST MEDICAL HISTORY: Past Medical History:  Diagnosis Date  . Abnormal CAT scan 2013  . Anxiety   . Cancer ([place])   . GERD (gastroesophageal reflux disease)   . Hepatitis C 2001   Followed by [facility]. Charlean Sanfilippo [facility]  . PONV (postoperative nausea and vomiting)     PAST SURGICAL HISTORY: Past Surgical History:  Procedure Laterality Date  . ABDOMINAL HYSTERECTOMY  2009  . breast cyst removal  1995  . BREAST LUMPECTOMY    . BREAST LUMPECTOMY WITH RADIOACTIVE SEED AND SENTINEL LYMPH NODE BIOPSY Left 06/14/2017   Procedure: LEFT BREAST SEED LOCALIZED LUMPECTOMY (2 SEEDS) AND SENTINEL LYMPH NODE BIOPSY;  Surgeon: Erroll Luna, MD;  Location: [facility];  Service: General;  Laterality: Left;  . COLONOSCOPY  2011  . FLEXIBLE SIGMOIDOSCOPY  February 12, 2013   have normal perianal exam, question focal prolapse. 2 small scars in the rectum.  Marland Kitchen HEMORRHOID BANDING  2012   3 times over three months  . SIGMOIDOSCOPY  2013  . TONSILECTOMY/ADENOIDECTOMY WITH MYRINGOTOMY  remote  . UPPER GI ENDOSCOPY  2013    FAMILY HISTORY Family History  Problem Relation Age of Onset  . Hypertension Mother   . Thyroid disease Mother        Multi problems  . Coronary artery disease Father   . Heart failure Father   . Atrial fibrillation Father   . Diabetes Brother    As of 2018, the patient's father is 48 years old.  As of March 2019 the  patient's mother is under hospice care and not expected to survive the month.  The patient has three brothers and no sisters. Her maternal great aunt had breast cancer when she was after 83 and her other maternal great aunt had pancreatic cancer. She has no family history of ovarian cancer. The patient's father had prostate cancer in his 32's.   GYNECOLOGIC HISTORY:  No LMP recorded. Patient has had a hysterectomy.  Menarche: 57 years old GP: She never carried a pregnancy to term LMP: Hysterectomy about 10 years ago, no salpingo-oophorectomy HRT: She was on Estradiol on and off the last three years. 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 She uses gloves when using colorings and other chemicals. Her husband [name] is retired and was in hosiery. They have one dog. She is not a Banker.     ADVANCED DIRECTIVES: Not in place   HEALTH MAINTENANCE: Social History   Tobacco Use  . Smoking status: Former Smoker    Packs/day: 1.50    Years: 20.00    Pack years: 30.00    Types: Cigarettes    Last attempt to quit: 04/19/1998    Years since quitting: 19.1  . Smokeless tobacco: Never Used  . Tobacco comment: quit smoking  in 2000  Substance Use Topics  . Alcohol use: No  . Drug use: No     Colonoscopy: [facility] in [place]  PAP: Status post hysterectomy  Bone density: at 50 --does not know results   Allergies  Allergen Reactions  . Propoxyphene Rash    Uncoded Allergy. Allergen: mellaril, Other Reaction: Other reaction, double vision Uncoded Allergy. Allergen: mellaril, Other Reaction: Other reaction, double vision Uncoded Allergy. Allergen: mellaril, Other Reaction: Other reaction, double vision  . Thioridazine Hcl     REACTION: Reaction not known  . Bupropion Rash and Other (See Comments)    Insomnia Insomnia  . Citalopram Rash and Other (See Comments)    Lightheaded Lightheaded  . Duloxetine Rash and Other (See Comments)     Insomnia Insomnia  . Thioridazine Rash    REACTION: Reaction not known REACTION: Reaction not known  . Venlafaxine Rash and Other (See Comments)    Tolerated once, but then another time it caused malaise. Tolerated once, but then another time it caused malaise.    Current Outpatient Medications  Medication Sig Dispense Refill  . cholecalciferol (VITAMIN D) 1000 units tablet Take 1,000 Units by mouth daily.    Marland Kitchen LORazepam (ATIVAN) 0.5 MG tablet Take 1 mg by mouth as needed.     Marland Kitchen omeprazole (PRILOSEC) 40 MG capsule Take 40 mg by mouth daily.    . ondansetron (ZOFRAN ODT) 4 MG disintegrating tablet Take 1 tablet (4 mg total) by mouth every 8 (eight) hours as needed for nausea or vomiting. 20 tablet 0  . oxyCODONE (OXY IR/ROXICODONE) 5 MG immediate release tablet Take 1 tablet (5 mg total) by mouth every 6 (six) hours as needed for severe pain. 12 tablet 0  . tamoxifen (NOLVADEX) 20 MG tablet Take 20 mg by mouth daily.    Marland Kitchen zolpidem (AMBIEN) 5 MG tablet Take by mouth as needed.      No current facility-administered medications for this visit.     OBJECTIVE: Middle-aged white woman who appears younger than stated age  47:   06/23/17 0946  BP: (!) 128/91  Pulse: 79  Resp: 18  Temp: 98.1 F (36.7 C)  SpO2: 100%     Body mass index is 23.29 kg/m.   Wt Readings from Last 3 Encounters:  06/23/17 148 lb 11.2 oz (67.4 kg)  06/14/17 149 lb 2 oz (67.6 kg)  05/04/17 149 lb (67.6 kg)      ECOG FS:1 - Symptomatic but completely ambulatory  Sclerae unicteric, EOMs intact Oropharynx clear and moist No cervical or supraclavicular adenopathy Lungs no rales or rhonchi Heart regular rate and rhythm Abd soft, nontender, positive bowel sounds MSK no focal spinal tenderness, no upper extremity lymphedema Neuro: nonfocal, well oriented, appropriate affect Breasts: The right breast is unremarkable.  The left breast is status post recent lumpectomy.  The incision is healing very nicely.   The cosmetic result is good.  There is no erythema or tenderness.  There is minimal swelling in the left axilla.   LAB RESULTS:  CMP     Component Value Date/Time   NA 139 06/12/2017 1911   K 3.0 (L) 06/12/2017 1911   CL 108 06/12/2017 1911   CO2 20 (L) 06/12/2017 1911   GLUCOSE 128 (H) 06/12/2017 1911   BUN 17 06/12/2017 1911   CREATININE 0.67 06/12/2017 1911   CREATININE 0.91 04/29/2017 1348   CALCIUM 8.6 (L) 06/12/2017 1911   PROT 7.6 06/12/2017 1911   ALBUMIN 4.1 06/12/2017  1911   AST 33 06/12/2017 1911   AST 15 04/29/2017 1348   ALT 20 06/12/2017 1911   ALT 14 04/29/2017 1348   ALKPHOS 49 06/12/2017 1911   BILITOT 0.8 06/12/2017 1911   BILITOT 0.7 04/29/2017 1348   GFRNONAA >60 06/12/2017 1911   GFRNONAA >60 04/29/2017 1348   GFRAA >60 06/12/2017 1911   GFRAA >60 04/29/2017 1348    No results found for: TOTALPROTELP, ALBUMINELP, A1GS, A2GS, BETS, BETA2SER, GAMS, MSPIKE, SPEI  No results found for: KPAFRELGTCHN, LAMBDASER, KAPLAMBRATIO  Lab Results  Component Value Date   WBC 4.7 06/23/2017   NEUTROABS 2.6 06/23/2017   HGB 13.8 06/23/2017   HCT 41.8 06/23/2017   MCV 92.3 06/23/2017   PLT 210 06/23/2017    '@LASTCHEMISTRY'$ @  No results found for: LABCA2  No components found for: [id number]  No results for input(s): INR in the last 168 hours.  No results found for: LABCA2  No results found for: MMN817  No results found for: RNH657  No results found for: XUX833  No results found for: CA2729  No components found for: HGQUANT  No results found for: CEA1 / No results found for: CEA1   No results found for: AFPTUMOR  No results found for: CHROMOGRNA  No results found for: PSA1  Appointment on 06/23/2017  Component Date Value Ref Range Status  . WBC 06/23/2017 4.7  3.9 - 10.3 K/uL Final  . RBC 06/23/2017 4.53  3.70 - 5.45 MIL/uL Final  . Hemoglobin 06/23/2017 13.8  11.6 - 15.9 g/dL Final  . HCT 06/23/2017 41.8  34.8 - 46.6 % Final  . MCV  06/23/2017 92.3  79.5 - 101.0 fL Final  . MCH 06/23/2017 30.5  25.1 - 34.0 pg Final  . MCHC 06/23/2017 33.0  31.5 - 36.0 g/dL Final  . RDW 06/23/2017 14.0  11.2 - 14.5 % Final  . Platelets 06/23/2017 210  145 - 400 K/uL Final  . Neutrophils Relative % 06/23/2017 56  % Final  . Neutro Abs 06/23/2017 2.6  1.5 - 6.5 K/uL Final  . Lymphocytes Relative 06/23/2017 32  % Final  . Lymphs Abs 06/23/2017 1.5  0.9 - 3.3 K/uL Final  . Monocytes Relative 06/23/2017 7  % Final  . Monocytes Absolute 06/23/2017 0.3  0.1 - 0.9 K/uL Final  . Eosinophils Relative 06/23/2017 4  % Final  . Eosinophils Absolute 06/23/2017 0.2  0.0 - 0.5 K/uL Final  . Basophils Relative 06/23/2017 1  % Final  . Basophils Absolute 06/23/2017 0.0  0.0 - 0.1 K/uL Final   Performed at [facility], [address] [place], [place] [address]    (this displays the last labs from the last 3 days)  No results found for: TOTALPROTELP, ALBUMINELP, A1GS, A2GS, BETS, BETA2SER, GAMS, MSPIKE, SPEI (this displays SPEP labs)  No results found for: KPAFRELGTCHN, LAMBDASER, KAPLAMBRATIO (kappa/lambda light chains)  No results found for: HGBA, HGBA2QUANT, HGBFQUANT, HGBSQUAN (Hemoglobinopathy evaluation)   No results found for: LDH  No results found for: IRON, TIBC, IRONPCTSAT (Iron and TIBC)  No results found for: FERRITIN  Urinalysis    Component Value Date/Time   COLORURINE YELLOW (A) 06/12/2017 1911   APPEARANCEUR HAZY (A) 06/12/2017 1911   LABSPEC 1.025 06/12/2017 1911   PHURINE 5.0 06/12/2017 1911   GLUCOSEU NEGATIVE 06/12/2017 1911   HGBUR NEGATIVE 06/12/2017 1911   BILIRUBINUR NEGATIVE 06/12/2017 1911   KETONESUR NEGATIVE 06/12/2017 1911   PROTEINUR NEGATIVE 06/12/2017 1911   UROBILINOGEN 0.2  08/17/2011 0902   NITRITE NEGATIVE 06/12/2017 1911   LEUKOCYTESUR NEGATIVE 06/12/2017 1911     STUDIES: Nm Sentinel Node Inj-no Rpt (breast)  Result Date: 06/14/2017 Sulfur colloid was injected by  the nuclear medicine technologist for melanoma sentinel node.   Mm Breast Surgical Specimen  Result Date: 06/14/2017 CLINICAL DATA:  Patient status post left breast lumpectomy. EXAM: SPECIMEN RADIOGRAPH OF THE LEFT BREAST COMPARISON:  Previous exam(s). FINDINGS: Status post excision of the left breast. The radioactive seeds and biopsy marker clips are present, completely intact, and were marked for pathology. IMPRESSION: Specimen radiograph of the left breast. Electronically Signed   By: Lovey Newcomer M.D.   On: 06/14/2017 14:25   Mm Lt Radioactive Seed Loc Mammo Guide  Result Date: 06/13/2017 CLINICAL DATA:  Recently diagnosed left breast cancer in the upper left breast at sites of ribbon shaped biopsy marking clip and coil shaped biopsy marking clip. EXAM: MAMMOGRAPHIC GUIDED RADIOACTIVE SEED LOCALIZATION OF THE LEFT BREAST COMPARISON:  Previous exam(s). FINDINGS: Patient presents for radioactive seed localization prior to . I met with the patient and we discussed the procedure of seed localization including benefits and alternatives. We discussed the high likelihood of a successful procedure. We discussed the risks of the procedure including infection, bleeding, tissue injury and further surgery. We discussed the low dose of radioactivity involved in the procedure. Informed, written consent was given. The usual time-out protocol was performed immediately prior to the procedure. SITE 1: LEFT BREAST COIL SHAPED BIOPSY MARKING CLIP: Using mammographic guidance, sterile technique, 1% lidocaine and an I-125 radioactive seed, the coil shaped biopsy marking clip was localized using a superior to inferior approach. The follow-up mammogram images confirm the seed in the expected location and were marked for Dr. Brantley Stage. Follow-up survey of the patient confirms presence of the radioactive seed. Order number of I-125 seed:  [id number]. Total activity:  5.284 millicuries reference Date: 06/03/2017 The usual time-out  protocol was performed immediately prior to the procedure. SITE 2 LEFT BREAST RIBBON SHAPED BIOPSY MARKING CLIP: Using mammographic guidance, sterile technique, 1% lidocaine and an I-125 radioactive seed, the ribbon shaped biopsy marking clip was localized using a superior to inferior approach. The follow-up mammogram images confirm the seed in the expected location and were marked for Dr. Brantley Stage. Follow-up survey of the patient confirms presence of the radioactive seed. Order number of I-125 seed:  [id number]. Total activity:  7.253 millicuries reference Date: 06/03/2017 The patient tolerated the procedure well and was released from the [facility]. She was given instructions regarding seed removal. IMPRESSION: Radioactive seed localization left breast breast 2 sites. No apparent complications. Electronically Signed   By: Everlean Alstrom M.D.   On: 06/13/2017 15:48   Mm Lt Rad Seed Ea Add Lesion Loc Mammo  Result Date: 06/13/2017 CLINICAL DATA:  Recently diagnosed left breast cancer in the upper left breast at sites of ribbon shaped biopsy marking clip and coil shaped biopsy marking clip. EXAM: MAMMOGRAPHIC GUIDED RADIOACTIVE SEED LOCALIZATION OF THE LEFT BREAST COMPARISON:  Previous exam(s). FINDINGS: Patient presents for radioactive seed localization prior to . I met with the patient and we discussed the procedure of seed localization including benefits and alternatives. We discussed the high likelihood of a successful procedure. We discussed the risks of the procedure including infection, bleeding, tissue injury and further surgery. We discussed the low dose of radioactivity involved in the procedure. Informed, written consent was given. The usual time-out protocol was performed immediately prior to the procedure. SITE 1:  LEFT BREAST COIL SHAPED BIOPSY MARKING CLIP: Using mammographic guidance, sterile technique, 1% lidocaine and an I-125 radioactive seed, the coil shaped biopsy marking clip was localized  using a superior to inferior approach. The follow-up mammogram images confirm the seed in the expected location and were marked for Dr. Brantley Stage. Follow-up survey of the patient confirms presence of the radioactive seed. Order number of I-125 seed:  [id number]. Total activity:  1.601 millicuries reference Date: 06/03/2017 The usual time-out protocol was performed immediately prior to the procedure. SITE 2 LEFT BREAST RIBBON SHAPED BIOPSY MARKING CLIP: Using mammographic guidance, sterile technique, 1% lidocaine and an I-125 radioactive seed, the ribbon shaped biopsy marking clip was localized using a superior to inferior approach. The follow-up mammogram images confirm the seed in the expected location and were marked for Dr. Brantley Stage. Follow-up survey of the patient confirms presence of the radioactive seed. Order number of I-125 seed:  [id number]. Total activity:  2.202 millicuries reference Date: 06/03/2017 The patient tolerated the procedure well and was released from the [facility]. She was given instructions regarding seed removal. IMPRESSION: Radioactive seed localization left breast breast 2 sites. No apparent complications. Electronically Signed   By: Everlean Alstrom M.D.   On: 06/13/2017 15:48    ELIGIBLE FOR AVAILABLE RESEARCH PROTOCOL: no  ASSESSMENT: 57 y.o. [place], [place] woman status post biopsy of 2 areas in the upper outer quadrant of the left breast 04/08/2017, both showing extensive ductal carcinoma in situ low-grade, but both showing invasive ductal carcinoma (measuring 2-3 mm on the slides), grade 1, estrogen and progesterone receptor positive, HER-2 not amplified, with an MIB-1 of 5%  (1) left lumpectomy and sentinel lymph node sampling 0226 2019 showed only residual ductal carcinoma in situ, low-grade, with negative margins.  (a) a total of 3 axillary lymph nodes were removed  (2) adjuvant radiation pending  (3) genetics testing scheduled for 07/04/2017  (5) tamoxifen started  neoadjuvantly 04/29/2017    PLAN: Courtney Romero did very well with her surgery and the results are very favorable.  We discussed the fact that when the invasive component is 5 milli-meters or less there is no need for chemotherapy.  In her case it was more like 2 mm.  This is very favorable.  We reviewed the difference between noninvasive and invasive tumor and she understands primarily what she had was noninvasive disease.  She is tolerating the tamoxifen well and the plan will be to continue that for a total of 5 years.  This will not only reduce her risk of this cancer recurring, but it will cut in half her risk of developing another breast cancer in the future.  She is now ready for adjuvant radiation.  She has also been scheduled for genetics testing.  If she does carry a deleterious mutation she will return to see me and we will discuss that in the high risk clinic.  Otherwise she understands that we go back to yearly mammography with tomography which in her case will likely be in October.  I am going to see her again in June.  By then she should be recovering from her radiation.  She knows to call for any other issues that may develop before the next visit.    Faizon Capozzi, Virgie Dad, MD  06/23/17 10:01 AM Medical Oncology and Hematology [facility] [address] [place], [place] [address] Tel. [phone]    Fax. [phone]  This document serves as a record of services personally performed by Lurline Del, MD.  It was created on his behalf by Sheron Nightingale, a trained medical scribe. The creation of this record is based on the scribe's personal observations and the provider's statements to them.   I have reviewed the above documentation for accuracy and completeness, and I agree with the above.

## 2017-06-23 ENCOUNTER — Telehealth: Payer: Self-pay | Admitting: Oncology

## 2017-06-23 ENCOUNTER — Inpatient Hospital Stay (HOSPITAL_BASED_OUTPATIENT_CLINIC_OR_DEPARTMENT_OTHER): Admitting: Oncology

## 2017-06-23 ENCOUNTER — Inpatient Hospital Stay: Attending: Oncology

## 2017-06-23 VITALS — BP 128/91 | HR 79 | Temp 98.1°F | Resp 18 | Ht 67.0 in | Wt 148.7 lb

## 2017-06-23 DIAGNOSIS — C50212 Malignant neoplasm of upper-inner quadrant of left female breast: Secondary | ICD-10-CM | POA: Diagnosis not present

## 2017-06-23 DIAGNOSIS — Z170 Estrogen receptor positive status [ER+]: Secondary | ICD-10-CM | POA: Insufficient documentation

## 2017-06-23 DIAGNOSIS — C50812 Malignant neoplasm of overlapping sites of left female breast: Secondary | ICD-10-CM | POA: Diagnosis not present

## 2017-06-23 DIAGNOSIS — B182 Chronic viral hepatitis C: Secondary | ICD-10-CM

## 2017-06-23 DIAGNOSIS — I700 Atherosclerosis of aorta: Secondary | ICD-10-CM

## 2017-06-23 DIAGNOSIS — C50412 Malignant neoplasm of upper-outer quadrant of left female breast: Secondary | ICD-10-CM

## 2017-06-23 LAB — COMPREHENSIVE METABOLIC PANEL
ALT: 23 U/L (ref 0–55)
AST: 15 U/L (ref 5–34)
Albumin: 3.7 g/dL (ref 3.5–5.0)
Alkaline Phosphatase: 48 U/L (ref 40–150)
Anion gap: 8 (ref 3–11)
BUN: 12 mg/dL (ref 7–26)
CO2: 26 mmol/L (ref 22–29)
Calcium: 9.3 mg/dL (ref 8.4–10.4)
Chloride: 110 mmol/L — ABNORMAL HIGH (ref 98–109)
Creatinine, Ser: 0.77 mg/dL (ref 0.60–1.10)
GFR calc Af Amer: >60 mL/min (ref >60)
GFR calc non Af Amer: >60 mL/min (ref >60)
Glucose, Bld: 98 mg/dL (ref 70–140)
Potassium: 4 mmol/L (ref 3.5–5.1)
Sodium: 144 mmol/L (ref 136–145)
Total Bilirubin: 0.5 mg/dL (ref 0.2–1.2)
Total Protein: 7.3 g/dL (ref 6.4–8.3)

## 2017-06-23 LAB — CBC WITH DIFFERENTIAL/PLATELET
Basophils Absolute: 0 10*3/uL (ref 0.0–0.1)
Basophils Relative: 1 %
Eosinophils Absolute: 0.2 10*3/uL (ref 0.0–0.5)
Eosinophils Relative: 4 %
HCT: 41.8 % (ref 34.8–46.6)
Hemoglobin: 13.8 g/dL (ref 11.6–15.9)
Lymphocytes Relative: 32 %
Lymphs Abs: 1.5 10*3/uL (ref 0.9–3.3)
MCH: 30.5 pg (ref 25.1–34.0)
MCHC: 33 g/dL (ref 31.5–36.0)
MCV: 92.3 fL (ref 79.5–101.0)
Monocytes Absolute: 0.3 10*3/uL (ref 0.1–0.9)
Monocytes Relative: 7 %
Neutro Abs: 2.6 10*3/uL (ref 1.5–6.5)
Neutrophils Relative %: 56 %
Platelets: 210 10*3/uL (ref 145–400)
RBC: 4.53 MIL/uL (ref 3.70–5.45)
RDW: 14 % (ref 11.2–14.5)
WBC: 4.7 10*3/uL (ref 3.9–10.3)

## 2017-06-23 MED ORDER — TAMOXIFEN CITRATE 20 MG PO TABS: 20.0000 mg | ORAL_TABLET | Freq: Every day | ORAL | 4 refills | Status: DC

## 2017-06-23 NOTE — Telephone Encounter (Signed)
Gave avs and calendar for June °

## 2017-06-28 ENCOUNTER — Other Ambulatory Visit: Payer: Self-pay

## 2017-06-28 ENCOUNTER — Ambulatory Visit
Admission: RE | Admit: 2017-06-28 | Discharge: 2017-06-28 | Disposition: A | Discharge status: Home / Self Care | Source: Ambulatory Visit | Attending: Radiation Oncology | Admitting: Radiation Oncology

## 2017-06-28 ENCOUNTER — Encounter: Payer: Self-pay | Admitting: Radiation Oncology

## 2017-06-28 VITALS — BP 131/85 | HR 79 | Temp 98.1°F | Ht 67.0 in | Wt 149.0 lb

## 2017-06-28 DIAGNOSIS — Z79899 Other long term (current) drug therapy: Secondary | ICD-10-CM | POA: Insufficient documentation

## 2017-06-28 DIAGNOSIS — Z170 Estrogen receptor positive status [ER+]: Secondary | ICD-10-CM

## 2017-06-28 DIAGNOSIS — C50212 Malignant neoplasm of upper-inner quadrant of left female breast: Secondary | ICD-10-CM | POA: Insufficient documentation

## 2017-06-28 DIAGNOSIS — Z9889 Other specified postprocedural states: Secondary | ICD-10-CM | POA: Diagnosis not present

## 2017-06-28 NOTE — Progress Notes (Signed)
Radiation Oncology         [phone] ________________________________  Name: Courtney Romero MRN: [id number]  Date: 06/28/2017  DOB: 1961/02/28  Follow-Up Visit Note  Outpatient  CC: [name], MD  Magrinat, Virgie Dad, MD  Diagnosis:      ICD-10-CM   1. 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     Patient underwent biopsy of right breast lesions that had been noted on 04/29/17 MRI. 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   Since consultation, she underwent left breast lumpectomy with sentinel node biopsy by Dr. Brantley Stage on 06/14/17. Pathology showed DCIS of size 1.2 cm with characteristics as described above. Margins were clear by at least 0.3 cm at superior margin. 0/3 lymph nodes were positive for carcinoma. There was no residual invasive disease found at surgery.  The patient met with Dr. Jana Hakim on 06/23/17 who noted the patient is tolerating tamoxifen well and should anticipate 5 years of therapy. She has been scheduled for genetics testing. She will follow up in June with medical oncology.  On review of systems, the patient denies any lymphedema issues but does report numbness and skin tingling to her upper left arm. She can only stretch her arm upward to a certain extent before it becomes uncomfortable. She denies pain. She endorses dry skin since beginning tamoxifen.            ALLERGIES:  is allergic to propoxyphene; thioridazine hcl; bupropion; citalopram; duloxetine; thioridazine; and venlafaxine.  Meds: Current Outpatient Medications  Medication Sig Dispense Refill  . cholecalciferol (VITAMIN D) 1000 units  tablet Take 1,000 Units by mouth daily.    Marland Kitchen LORazepam (ATIVAN) 0.5 MG tablet Take 1 mg by mouth as needed.     . Melatonin 5 MG TABS Take 1 tablet by mouth.    Marland Kitchen omeprazole (PRILOSEC) 40 MG capsule Take 40 mg by mouth daily.    . tamoxifen (NOLVADEX) 20 MG tablet Take 1 tablet (20 mg total) by mouth daily. 90 tablet 4  . ondansetron (ZOFRAN ODT) 4 MG disintegrating tablet Take 1 tablet (4 mg total) by mouth every 8 (eight) hours as needed for nausea or vomiting. (Patient not taking: Reported on 06/28/2017) 20 tablet 0  . zolpidem (AMBIEN) 5 MG tablet Take by mouth as needed.      No current facility-administered medications for this encounter.     Physical Findings:  height is '5\' 7"'$  (1.702 m) and weight is 149 lb (67.6 kg). Her temperature is 98.1 F (36.7 C). Her blood pressure is 131/85 and her pulse is 79. Her oxygen saturation is 100%. .     General: Alert and oriented, in no acute distress Extremities: No cyanosis or edema in UEs. Musculoskeletal: good ROM in arms Psychiatric: Judgment and insight are intact. Affect is appropriate. Breast exam reveals a little bit of swelling at left axillary and lumpectomy sites. Scars are healing well.  Lab Findings: Lab Results  Component Value Date   WBC 4.7 06/23/2017   HGB 13.8 06/23/2017   HCT 41.8 06/23/2017   MCV 92.3 06/23/2017   PLT 210 06/23/2017  Radiographic Findings: Nm Sentinel Node Inj-no Rpt (breast)  Result Date: 06/14/2017 Sulfur colloid was injected by the nuclear medicine technologist for melanoma sentinel node.   Mm Breast Surgical Specimen  Result Date: 06/14/2017 CLINICAL DATA:  Patient status post left breast lumpectomy. EXAM: SPECIMEN RADIOGRAPH OF THE LEFT BREAST COMPARISON:  Previous exam(s). FINDINGS: Status post excision of the left breast. The radioactive seeds and biopsy marker clips are present, completely intact, and were marked for pathology. IMPRESSION: Specimen radiograph of the left breast.  Electronically Signed   By: Lovey Newcomer M.D.   On: 06/14/2017 14:25   Mm Lt Radioactive Seed Loc Mammo Guide  Result Date: 06/13/2017 CLINICAL DATA:  Recently diagnosed left breast cancer in the upper left breast at sites of ribbon shaped biopsy marking clip and coil shaped biopsy marking clip. EXAM: MAMMOGRAPHIC GUIDED RADIOACTIVE SEED LOCALIZATION OF THE LEFT BREAST COMPARISON:  Previous exam(s). FINDINGS: Patient presents for radioactive seed localization prior to . I met with the patient and we discussed the procedure of seed localization including benefits and alternatives. We discussed the high likelihood of a successful procedure. We discussed the risks of the procedure including infection, bleeding, tissue injury and further surgery. We discussed the low dose of radioactivity involved in the procedure. Informed, written consent was given. The usual time-out protocol was performed immediately prior to the procedure. SITE 1: LEFT BREAST COIL SHAPED BIOPSY MARKING CLIP: Using mammographic guidance, sterile technique, 1% lidocaine and an I-125 radioactive seed, the coil shaped biopsy marking clip was localized using a superior to inferior approach. The follow-up mammogram images confirm the seed in the expected location and were marked for Dr. Brantley Stage. Follow-up survey of the patient confirms presence of the radioactive seed. Order number of I-125 seed:  [id number]. Total activity:  3.704 millicuries reference Date: 06/03/2017 The usual time-out protocol was performed immediately prior to the procedure. SITE 2 LEFT BREAST RIBBON SHAPED BIOPSY MARKING CLIP: Using mammographic guidance, sterile technique, 1% lidocaine and an I-125 radioactive seed, the ribbon shaped biopsy marking clip was localized using a superior to inferior approach. The follow-up mammogram images confirm the seed in the expected location and were marked for Dr. Brantley Stage. Follow-up survey of the patient confirms presence of the radioactive  seed. Order number of I-125 seed:  [id number]. Total activity:  0.388 millicuries reference Date: 06/03/2017 The patient tolerated the procedure well and was released from the [facility]. She was given instructions regarding seed removal. IMPRESSION: Radioactive seed localization left breast breast 2 sites. No apparent complications. Electronically Signed   By: Everlean Alstrom M.D.   On: 06/13/2017 15:48   Mm Lt Rad Seed Ea Add Lesion Loc Mammo  Result Date: 06/13/2017 CLINICAL DATA:  Recently diagnosed left breast cancer in the upper left breast at sites of ribbon shaped biopsy marking clip and coil shaped biopsy marking clip. EXAM: MAMMOGRAPHIC GUIDED RADIOACTIVE SEED LOCALIZATION OF THE LEFT BREAST COMPARISON:  Previous exam(s). FINDINGS: Patient presents for radioactive seed localization prior to . I met with the patient and we discussed the procedure of seed localization including benefits and alternatives. We discussed the high likelihood of a successful procedure. We discussed the risks of the procedure including infection, bleeding, tissue injury and further surgery. We discussed the low dose of radioactivity involved in the procedure. Informed, written consent was given. The usual time-out protocol was performed immediately prior to the procedure. SITE 1: LEFT BREAST COIL SHAPED BIOPSY MARKING CLIP: Using mammographic guidance, sterile technique, 1% lidocaine and an I-125  radioactive seed, the coil shaped biopsy marking clip was localized using a superior to inferior approach. The follow-up mammogram images confirm the seed in the expected location and were marked for Dr. Brantley Stage. Follow-up survey of the patient confirms presence of the radioactive seed. Order number of I-125 seed:  [id number]. Total activity:  4.825 millicuries reference Date: 06/03/2017 The usual time-out protocol was performed immediately prior to the procedure. SITE 2 LEFT BREAST RIBBON SHAPED BIOPSY MARKING CLIP: Using  mammographic guidance, sterile technique, 1% lidocaine and an I-125 radioactive seed, the ribbon shaped biopsy marking clip was localized using a superior to inferior approach. The follow-up mammogram images confirm the seed in the expected location and were marked for Dr. Brantley Stage. Follow-up survey of the patient confirms presence of the radioactive seed. Order number of I-125 seed:  [id number]. Total activity:  9.169 millicuries reference Date: 06/03/2017 The patient tolerated the procedure well and was released from the [facility]. She was given instructions regarding seed removal. IMPRESSION: Radioactive seed localization left breast breast 2 sites. No apparent complications. Electronically Signed   By: Everlean Alstrom M.D.   On: 06/13/2017 15:48    Impression/Plan: Left breast cancer  We discussed adjuvant radiotherapy today.  I recommend radiotherapy to the left breast in order to reduce her risk of locoregional recurrence by 2/3s.  The risks, benefits and side effects of this treatment were discussed in detail.  She understands that radiotherapy is associated with skin irritation and fatigue in the acute setting. Late effects can include cosmetic changes and rare injury to internal organs. She is enthusiastic about proceeding with treatment. A consent form has been signed and placed in her chart. The patient mentioned she will likely be traveling out of town from the 20/21st to 26th of March for the burial of her mother in [place]. We will plan for Ct sim on March 27 to hopefully begin treatment  early April.  The patient will meet with Dr. Brantley Stage on Friday and I encouraged her to discuss with him physical therapy to address tightness postoperatively.  A total of 3 medically necessary complex treatment devices will be fabricated and supervised by me: 2 fields with MLCs for custom blocks to protect heart, and lungs;  and, a Vac-lok. MORE COMPLEX DEVICES MAY BE MADE IN DOSIMETRY FOR FIELD IN  FIELD BEAMS FOR DOSE HOMOGENEITY.  I have requested : 3D Simulation which is medically necessary to give adequate dose to at risk tissues while sparing lungs and heart.  I have requested a DVH of the following structures: lungs, heart, lumpectomy cavity.    The patient will receive 40.05 Gy in 15 fractions to the left breast with 2 fields.  This will be followed by a boost.  I spent 20 minutes minutes face to face with the patient and more than 50% of that time was spent in counseling and/or coordination of care.    _____________________________________   Eppie Gibson, MD  This document serves as a record of services personally performed by Eppie Gibson, MD. It was created on his behalf by Linward Natal, a trained medical scribe. The creation of this record is based on the scribe's personal observations and the provider's statements to them. This document has been checked and approved by the attending provider.

## 2017-07-04 ENCOUNTER — Inpatient Hospital Stay (HOSPITAL_BASED_OUTPATIENT_CLINIC_OR_DEPARTMENT_OTHER): Admitting: Genetics

## 2017-07-04 ENCOUNTER — Encounter: Payer: Self-pay | Admitting: Genetics

## 2017-07-04 ENCOUNTER — Inpatient Hospital Stay

## 2017-07-04 DIAGNOSIS — Z170 Estrogen receptor positive status [ER+]: Secondary | ICD-10-CM

## 2017-07-04 DIAGNOSIS — Z803 Family history of malignant neoplasm of breast: Secondary | ICD-10-CM

## 2017-07-04 DIAGNOSIS — Z800 Family history of malignant neoplasm of digestive organs: Secondary | ICD-10-CM | POA: Diagnosis not present

## 2017-07-04 DIAGNOSIS — C50212 Malignant neoplasm of upper-inner quadrant of left female breast: Secondary | ICD-10-CM | POA: Diagnosis not present

## 2017-07-04 DIAGNOSIS — Z8042 Family history of malignant neoplasm of prostate: Secondary | ICD-10-CM | POA: Diagnosis not present

## 2017-07-04 DIAGNOSIS — C50412 Malignant neoplasm of upper-outer quadrant of left female breast: Secondary | ICD-10-CM

## 2017-07-04 NOTE — Progress Notes (Signed)
REFERRING PROVIDER: Chauncey Cruel, MD [address] [place], [place] [address]  PRIMARY PROVIDER:  [name], MD  PRIMARY REASON FOR VISIT:  1. Malignant neoplasm of upper-inner quadrant of left breast in female, estrogen receptor positive ([facility])   2. Family history of pancreatic cancer   3. Family history of prostate cancer   4. Family history of breast cancer   5. Malignant neoplasm of upper-outer quadrant of left breast in female, estrogen receptor positive ([facility])     HISTORY OF PRESENT ILLNESS:   Courtney Romero, a 57 y.o. female, was seen for a [facility] cancer genetics consultation at the request of Dr. Jana Hakim due to a personal and family history of cancer.  Courtney Romero presents to clinic today to discuss the possibility of a hereditary predisposition to cancer, genetic testing, and to further clarify her future cancer risks, as well as potential cancer risks for family members.   On 04/08/2017 at the age of 47, Courtney Romero was diagnosed with invasive ductal carcinoma of the left breast ER/PR +, HER2 not amplified. This was treated with left lumpectomy and she has started tamoxifen.  Adjuvant radiation pending.     CANCER HISTORY:   No history exists.     HORMONAL RISK FACTORS:  Menarche was at age 47.  First live birth at age N/A.  Ovaries intact: yes.  Hysterectomy: yes.  Menopausal status: perimenopausal.  HRT use: on and off last 3 yeras, stopped Dec 2018 years. Colonoscopy: yes; 2011, reportedly a 'couple polyps removed' due for one this year.. Mammogram within the last year: yes.   Past Medical History:  Diagnosis Date  . Abnormal CAT scan 2013  . Anxiety   . Cancer ([facility])   . Family history of breast cancer   . Family history of pancreatic cancer   . Family history of prostate cancer   . GERD (gastroesophageal reflux disease)   . Hepatitis C 2001   Followed by Dr. Charlean Sanfilippo at [facility]  . PONV (postoperative nausea and vomiting)      Past Surgical History:  Procedure Laterality Date  . ABDOMINAL HYSTERECTOMY  2009  . breast cyst removal  1995  . BREAST LUMPECTOMY    . BREAST LUMPECTOMY WITH RADIOACTIVE SEED AND SENTINEL LYMPH NODE BIOPSY Left 06/14/2017   Procedure: LEFT BREAST SEED LOCALIZED LUMPECTOMY (2 SEEDS) AND SENTINEL LYMPH NODE BIOPSY;  Surgeon: Erroll Luna, MD;  Location: [facility];  Service: General;  Laterality: Left;  . COLONOSCOPY  2011  . FLEXIBLE SIGMOIDOSCOPY  February 12, 2013   have normal perianal exam, question focal prolapse. 2 small scars in the rectum.  Marland Kitchen HEMORRHOID BANDING  2012   3 times over three months  . SIGMOIDOSCOPY  2013  . TONSILECTOMY/ADENOIDECTOMY WITH MYRINGOTOMY  remote  . UPPER GI ENDOSCOPY  2013    Social History   Socioeconomic History  . Marital status: Married    Spouse name: Not on file  . Number of children: Not on file  . Years of education: Not on file  . Highest education level: Not on file  Social Needs  . Financial resource strain: Not on file  . Food insecurity - worry: Not on file  . Food insecurity - inability: Not on file  . Transportation needs - medical: Not on file  . Transportation needs - non-medical: Not on file  Occupational History  . Occupation: Full Time  Tobacco Use  . Smoking status: Former Smoker    Packs/day:  1.50    Years: 20.00    Pack years: 30.00    Types: Cigarettes    Last attempt to quit: 04/19/1998    Years since quitting: 19.2  . Smokeless tobacco: Never Used  . Tobacco comment: quit smoking in 2000  Substance and Sexual Activity  . Alcohol use: No  . Drug use: No  . Sexual activity: Not on file  Other Topics Concern  . Not on file  Social History Narrative   Regular exercise: Yes     FAMILY HISTORY:  We obtained a detailed, 4-generation family history.  Significant diagnoses are listed below: Family History  Problem Relation Age of Onset  . Hypertension Mother   . Thyroid disease Mother         Multi problems  . Coronary artery disease Father   . Heart failure Father   . Atrial fibrillation Father   . Diabetes Brother    Courtney Romero has no children.  Courtney Romero has 3 brothers in their 20's with no history of cancer.  No nieces/nephews with any cancer.   Courtney Romero father is 67 with a history of prostate cancer diagnosed in his 79's.  He had surgery and radiation to treat this cancer.  Courtney Romero has no biological paternal aunts/uncles. Courtney Romero paternal grandparents died in their 33's/90's with no history of cancer.    Courtney Romero mother died a couple of weeks ago at the age of 80 and had no history of cancer.  She had her uterus and ovaries removed in her 30's.  Courtney Romero has a maternal aunt who died of pancreatic cancer in her 53's.  This aunt had 5 children who have no history of cancer.  Courtney Romero has another aunt who is still living in her 65's with no history of cancer.  This aunt has 3 children with no history of cancer.  Courtney Romero maternal grandfather died in his 67's with no history of cancer.  Courtney Romero maternal grandmother died in her 22's with no history of cancer.  This grandmother had a sister who had breast cancer in her 59's.    Courtney Romero is unaware of previous family history of genetic testing for hereditary cancer risks. Patient's maternal ancestors are of [place] and [place] European descent, and paternal ancestors are of [place] descent. There is no reported Ashkenazi Jewish ancestry. There is no known consanguinity.  GENETIC COUNSELING ASSESSMENT: Courtney Romero is a 57 y.o. female with a personal and family history which is somewhat suggestive of a Hereditary Cancer Predisposition Syndrome. We, therefore, discussed and recommended the following at today's visit.   DISCUSSION: We reviewed the characteristics, features and inheritance patterns of hereditary cancer syndromes. We also discussed genetic testing, including the  appropriate family members to test, the process of testing, insurance coverage and turn-around-time for results. We discussed the implications of a negative, positive and/or variant of uncertain significant result. We recommended Courtney Romero pursue genetic testing for the Common Hereditary Cancer gene panel. The Common Hereditary Cancer Panel offered by [facility] includes sequencing and/or deletion duplication testing of the following 47 genes: APC, ATM, AXIN2, BARD1, BMPR1A, BRCA1, BRCA2, BRIP1, CDH1, CDKN2A (p14ARF), CDKN2A (p16INK4a), CKD4, CHEK2, CTNNA1, DICER1, EPCAM (Deletion/duplication testing only), GREM1 (promoter region deletion/duplication testing only), KIT, MEN1, MLH1, MSH2, MSH3, MSH6, MUTYH, NBN, NF1, NHTL1, PALB2, PDGFRA, PMS2, POLD1, POLE, PTEN, RAD50, RAD51C, RAD51D, SDHB, SDHC, SDHD, SMAD4, SMARCA4. STK11, TP53, TSC1, TSC2, and VHL.  The following genes were evaluated for sequence changes  only: SDHA and HOXB13 c.251G>A variant only.  We discussed that only 5-10% of cancers are associated with a Hereditary cancer predisposition syndrome.  One of the most common hereditary cancer syndromes that increases breast cancer risk is called Hereditary Breast and Ovarian Cancer (HBOC) syndrome.  This syndrome is caused by mutations in the BRCA1 and BRCA2 genes.  This syndrome increases an individual's lifetime risk to develop breast, ovarian, pancreatic, and other types of cancer.  There are also many other cancer predisposition syndromes caused by mutations in several other genes.  We discussed that if she is found to have a mutation in one of these genes, it may impact future medical management recommendations such as increased cancer screenings and consideration of risk reducing surgeries.  A positive result could also have implications for the patient's family members.  A Negative result would mean we were unable to identify a hereditary component to her cancer, but does not rule out the  possibility of a hereditary basis for her cancer.  There could be mutations that are undetectable by current technology, or in genes not yet tested or identified to increase cancer risk.    We discussed the potential to find a Variant of Uncertain Significance or VUS.  These are variants that have not yet been identified as pathogenic or benign, and it is unknown if this variant is associated with increased cancer risk or if this is a normal finding.  Most VUS's are reclassified to benign or likely benign.   It should not be used to make medical management decisions. With time, we suspect the lab will determine the significance of any VUS's identified if any.   Based on Courtney Romero personal and family history of cancer, she meets medical criteria for genetic testing. Despite that she meets criteria, she may still have an out of pocket cost. We discussed that if her out of pocket cost for testing is over $100, the laboratory will call and confirm whether she wants to proceed with testing.  If the out of pocket cost of testing is less than $100 she will be billed by the genetic testing laboratory.   PLAN: After considering the risks, benefits, and limitations, Courtney Romero  provided informed consent to pursue genetic testing and the blood sample was sent to [facility] for analysis of the Common Hereditary Cancers Panel. Results should be available within approximately 2-3 weeks' time, at which point they will be disclosed by telephone to Ms. Broxson, as will any additional recommendations warranted by these results. Ms. Faux will receive a summary of her genetic counseling visit and a copy of her results once available. This information will also be available in Epic. We encouraged Ms. Kozub to remain in contact with cancer genetics annually so that we can continuously update the family history and inform her of any changes in cancer genetics and testing that may be of benefit for her  family. Ms. Graefe questions were answered to her satisfaction today. Our contact information was provided should additional questions or concerns arise.  Based on Ms. Carreon's family history, we recommended her maternal relatives appear to also meet medical criteria for genetic counseling and testing. Ms. Guilford will let us know if we can be of any assistance in coordinating genetic counseling and/or testing for this family member.   Lastly, we encouraged Ms. Myszka to remain in contact with cancer genetics annually so that we can continuously update the family history and inform her of any changes in cancer genetics  and testing that may be of benefit for this family.   Ms.  Abruzzo questions were answered to her satisfaction today. Our contact information was provided should additional questions or concerns arise. Thank you for the referral and allowing [place] to share in the care of your patient.   Tana Felts, MS, [facility] Certified Genetic Counselor [email or web address] phone: [phone]  The patient was seen for a total of 30 minutes in face-to-face genetic counseling.  The patient was accompanied today by her husband, [name].  This patient was discussed with Drs. Magrinat, Lindi Adie and/or Burr Medico who agrees with the above.

## 2017-07-05 DIAGNOSIS — K219 Gastro-esophageal reflux disease without esophagitis: Secondary | ICD-10-CM | POA: Insufficient documentation

## 2017-07-06 ENCOUNTER — Ambulatory Visit: Attending: Surgery | Admitting: Physical Therapy

## 2017-07-06 ENCOUNTER — Other Ambulatory Visit: Payer: Self-pay

## 2017-07-06 DIAGNOSIS — R29898 Other symptoms and signs involving the musculoskeletal system: Secondary | ICD-10-CM | POA: Insufficient documentation

## 2017-07-06 DIAGNOSIS — Z483 Aftercare following surgery for neoplasm: Secondary | ICD-10-CM | POA: Diagnosis present

## 2017-07-06 DIAGNOSIS — M25612 Stiffness of left shoulder, not elsewhere classified: Secondary | ICD-10-CM | POA: Diagnosis present

## 2017-07-06 NOTE — Patient Instructions (Addendum)
Axillary web syndrome (also called cording) can happen after having breast cancer surgery when lymph nodes in the armpit are removed. It presents as if you have a thin cord in your arm and can run from the armpit all the way down into the forearm. If you've had a sentinel node biopsy, the risk is 1-20% and if you've had an axillary lymph node dissection (more than 7 nodes removed), the risk is 36-72%. The ranges vary depending on the research study.  It most often happens 3-4 weeks post-op but can happen sooner or later. There are several possibilities for what cording actually is. Although no one knows for sure as of yet, it may be related to lymphatics, veins, or other tissue. Sometimes cording resolves on its own but other times it requires physical therapy with a therapist who specializes in lymphedema and/or cancer rehab. Treatment typically involves stretching, manual techniques, and exercise. Sometimes cords get "released" while stretching or during manual treatment and the patient may experience the sensation of a "pop." This may feel strange but it is not dangerous and is a sign that the cord has released; range of motion may be improved in the process.  Flexion (Eccentric) - Active-Assist (Cane)          Cancer Rehab [phone]    Use unaffected arm to push affected arm forward. Avoid hiking shoulder (shoulder should NOT touch cheek). Keep palm relaxed. Slowly lower affected arm. Hold stretch for _5_ seconds repeating _5-10_ times, _1-2_ times a day.  Abduction (Eccentric) - Active-Assist (Cane)    Use unaffected arm to push affected arm out to side. Avoid hiking shoulder (shoulder should NOT touch cheek). Keep palm relaxed. Slowly lower affected arm. Hold stretch _5_ seconds repeating _5-10_ times, _1-2_ times a day.  Cane Exercise: Extension   Stand holding cane behind back with both hands palm-up. Lift the cane away from body until gentle stretch felt. Do NOT lean forward.  Hold __5__  seconds. Repeat _5-10___ times. Do _1-2___ sessions per day.  CHEST: Doorway, Bilateral - Standing    Standing in doorway, place hands on wall with elbows bent at shoulder height and place one foot in front of other. Shift weight onto front foot. Hold _10-20__ seconds. Do _3-5_ times, _1-2_ times a day.

## 2017-07-06 NOTE — Therapy (Signed)
[place], [place], [address] Phone: [phone]   Fax:  [phone]  Physical Therapy Evaluation  Patient Details  Name: Courtney Romero MRN: [id number] Date of Birth: 04/24/1960 Referring Provider: Dr. Erroll Luna   Encounter Date: 07/06/2017  PT End of Session - 07/06/17 2107    Visit Number  1    Number of Visits  9    Date for PT Re-Evaluation  08/12/17    PT Start Time  8756    PT Stop Time  1434    PT Time Calculation (min)  49 min    Activity Tolerance  Patient tolerated treatment well    Behavior During Therapy  [facility] for tasks assessed/performed       Past Medical History:  Diagnosis Date  . Abnormal CAT scan 2013  . Anxiety   . Cancer ([place])   . Family history of breast cancer   . Family history of pancreatic cancer   . Family history of prostate cancer   . GERD (gastroesophageal reflux disease)   . Hepatitis C 2001   Followed by Dr. Charlean Sanfilippo at [facility]  . PONV (postoperative nausea and vomiting)     Past Surgical History:  Procedure Laterality Date  . ABDOMINAL HYSTERECTOMY  2009  . breast cyst removal  1995  . BREAST LUMPECTOMY    . BREAST LUMPECTOMY WITH RADIOACTIVE SEED AND SENTINEL LYMPH NODE BIOPSY Left 06/14/2017   Procedure: LEFT BREAST SEED LOCALIZED LUMPECTOMY (2 SEEDS) AND SENTINEL LYMPH NODE BIOPSY;  Surgeon: Erroll Luna, MD;  Location: [place];  Service: General;  Laterality: Left;  . COLONOSCOPY  2011  . FLEXIBLE SIGMOIDOSCOPY  February 12, 2013   have normal perianal exam, question focal prolapse. 2 small scars in the rectum.  Marland Kitchen HEMORRHOID BANDING  2012   3 times over three months  . SIGMOIDOSCOPY  2013  . TONSILECTOMY/ADENOIDECTOMY WITH MYRINGOTOMY  remote  . UPPER GI ENDOSCOPY  2013    There were no vitals filed for this visit.   Subjective Assessment - 07/06/17 1346    Subjective  "I had breast cancer and I had lumpectomy and  three of my lymph nodes out, so that's the problem with my arm.  When I try to extend my arm there's a pain that goes all the way down. Last night I don't know if I felt a lump or a knot or what."    Pertinent History  Left breast cancer diagnosed 04/14/17 and had lumpectomy 06/14/17 with SLNB (3 nodes removed).  Expects to have radiation and has set-up on 07/18/17; will have 20 treatments. Has been on tamoxifen since January. Otherwise healthy. Did have a right shoulder impingement a year ago or so and says that is a lot better than it was.    Patient Stated Goals  to play golf    Currently in Pain?  Yes    Pain Score  2     Pain Location  Breast and axilla and arm    Pain Orientation  Left    Pain Descriptors / Indicators  Tingling;Other (Comment) twinges    Pain Frequency  Intermittent    Aggravating Factors   reaching the left arm out aggravates all the way down the arm    Pain Relieving Factors  little bit of moving the shoulder         [facility] PT Assessment - 07/06/17 0001      Assessment   Medical  Diagnosis  left breast cancer (DCIS) s/p lumpectomy     Referring Provider  Dr. Marcello Moores Corn[place]    Onset Date/Surgical Date  06/14/17    Hand Dominance  Right    Prior Therapy  none      Precautions   Precautions  Other (comment)    Precaution Comments  cancer precautions      Restrictions   Weight Bearing Restrictions  No      Balance Screen   Has the patient fallen in the past 6 months  No    Has the patient had a decrease in activity level because of a fear of falling?   No    Is the patient reluctant to leave their home because of a fear of falling?   No      Home Environment   Living Environment  Private residence    Living Arrangements  Spouse/significant other    Type of [place]  One level      Prior Function   Level of Independence  Independent    Vocation  Full time employment    Vocation Requirements  hairdresser    Leisure  walking at the gym 45  mins. 4 days/wk or so; plans to start leg exercises soon      Cognition   Overall Cognitive Status  Within Functional Limits for tasks assessed      Observation/Other Assessments   Observations  visible cording at left axilla with left shoulder abduction    Skin Integrity  two incisions: upper breast and axilla, each about two inches long and healing well, though with very small scabs present.      ROM / Strength   AROM / PROM / Strength  AROM;PROM      AROM   AROM Assessment Site  Shoulder    Right/Left Shoulder  Right;Left    Right Shoulder Flexion  156 Degrees    Right Shoulder ABduction  175 Degrees    Right Shoulder Internal Rotation  -- WFL in supine    Right Shoulder External Rotation  88 Degrees    Left Shoulder Flexion  132 Degrees seated    Left Shoulder ABduction  102 Degrees    Left Shoulder Internal Rotation  -- in supine, WFL    Left Shoulder External Rotation  74 Degrees      PROM   PROM Assessment Site  Shoulder    Right/Left Shoulder  Left    Left Shoulder Flexion  141 Degrees    Left Shoulder ABduction  100 Degrees mildly palpable cord in upper arm      Palpation   Palpation comment  palpable cording at left axilla with left shoulder in abduction      Ambulation/Gait   Ambulation/Gait  Yes        LYMPHEDEMA/ONCOLOGY QUESTIONNAIRE - 07/06/17 1416      Lymphedema Assessments   Lymphedema Assessments  Upper extremities      Right Upper Extremity Lymphedema   10 cm Proximal to Olecranon Process  26.2 cm    Olecranon Process  23.8 cm    10 cm Proximal to Ulnar Styloid Process  19.2 cm    Just Proximal to Ulnar Styloid Process  14.9 cm    Across Hand at [employer]  17.7 cm    At [place] of 2nd Digit  5.9 cm      Left Upper Extremity Lymphedema   10 cm Proximal to Olecranon Process  26.8 cm    Olecranon Process  23.7 cm    10 cm Proximal to Ulnar Styloid Process  19.9 cm    Just Proximal to Ulnar Styloid Process  15.3 cm    Across Hand at [employer]  17.7 cm    At [place] of 2nd Digit  5.9 cm          Objective measurements completed on examination: See above findings.              PT Education - 07/06/17 2106    Education provided  Yes    Education Details  about cording; standing dowel shoulder ROM exercises; about free ABC class; about lymphnet.org as a website for reliable information    Person(s) Educated  Patient    Methods  Explanation;Handout    Comprehension  Verbalized understanding;Returned demonstration          PT Long Term Goals - 07/06/17 2113      PT LONG TERM GOAL #1   Title  Pt. will report at least 50% decrease in discomfort and greater ease in reaching left arm forward and up.    Time  4    Period  Weeks    Status  New      PT LONG TERM GOAL #2   Title  Pt. will be independent with home exercise program for shoulder ROM    Time  4    Period  Weeks    Status  New      PT LONG TERM GOAL #3   Title  Pt. will show at least 140 degrees of left shoulder abduction for improved ADLs.    Baseline  102 on left compared to 175 on right at eval    Time  4    Period  Weeks    Status  New      PT LONG TERM GOAL #4   Title  Pt. will be able to get into position for radiation and complete her radiation set-up.    Time  3    Period  Weeks    Status  New             Plan - 07/06/17 2108    Clinical Impression Statement  This is a pleasant woman approx. 3 weeks post-op lumpectomy and SLNB for left breast DCIS. She expects to have radiation treatment coming up soon.  She does have limited left shoulder ROM compared to the right, though it is not bad for this soon post-op.  However, she does appear to have cording in left axilla and c/o discomfort all the way down her left arm with reaching that arm out forward.     Clinical Decision Making  Low    Rehab Potential  Excellent    PT Frequency  2x / week    PT Duration  4 weeks    PT Treatment/Interventions  ADLs/Self Care Home  Management;Moist Heat;Therapeutic exercise;Patient/family education;Manual techniques;Manual lymph drainage;Compression bandaging;Scar mobilization;Passive range of motion    PT Next Visit Plan  review, progress HEP; manual techniques (soft tissue mobilization, myofascial release) to decrease cording    PT Home Exercise Plan  standing dowel shoulder flexion and abduction; doorway stretch    Recommended Other Services  ABC class    Consulted and Agree with Plan of Care  Patient       Patient will benefit from skilled therapeutic intervention in order to improve the following deficits and impairments:  Decreased range of  motion, Increased fascial restricitons, Pain, Impaired UE functional use  Visit Diagnosis: Stiffness of left shoulder, not elsewhere classified - Plan: PT plan of care cert/re-cert  Other symptoms and signs involving the musculoskeletal system - Plan: PT plan of care cert/re-cert  Aftercare following surgery for neoplasm - Plan: PT plan of care cert/re-cert     Problem List Patient Active Problem List   Diagnosis Date Noted  . Family history of pancreatic cancer   . Family history of prostate cancer   . Family history of breast cancer   . Aortic atherosclerosis ([place]) 04/28/2017  . Malignant neoplasm of upper-inner quadrant of left breast in female, estrogen receptor positive ([place]) 04/27/2017  . Hepatitis C, chronic ([place]) 09/21/2016  . Health care maintenance 07/29/2015  . Menopausal disorder 01/20/2014  . 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 Rectal pain 05/31/2013  . Hemorrhoids 05/31/2013  . Rectal fissure 11/20/2012  . 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 Bloating 10/03/2012  . Nausea 10/03/2012  . Anal pain 05/09/2012  . Anxiety and depression 04/04/2012  . Diverticulosis 03/21/2012  . HEPATITIS C 01/08/2009  . SHORTNESS OF BREATH 01/08/2009    Pairlee Sawtell 07/06/2017, 9:19 PM  [place], [place], [address] Phone: [phone]   Fax:  [phone]  Name: Courtney Romero MRN: [id number] Date of Birth: 07-16-1960  Serafina Royals, PT 07/06/17 9:20 PM

## 2017-07-13 ENCOUNTER — Ambulatory Visit: Admitting: Physical Therapy

## 2017-07-15 ENCOUNTER — Encounter: Payer: Self-pay | Admitting: Physical Therapy

## 2017-07-15 ENCOUNTER — Ambulatory Visit: Admitting: Physical Therapy

## 2017-07-15 DIAGNOSIS — M25612 Stiffness of left shoulder, not elsewhere classified: Secondary | ICD-10-CM | POA: Diagnosis not present

## 2017-07-15 DIAGNOSIS — R29898 Other symptoms and signs involving the musculoskeletal system: Secondary | ICD-10-CM

## 2017-07-15 NOTE — Therapy (Signed)
[place], [place], [address] Phone: [phone]   Fax:  [phone]  Physical Therapy Treatment  Patient Details  Name: Courtney Romero MRN: [id number] Date of Birth: May 29, 1960 Referring Provider: Dr. Erroll Luna   Encounter Date: 07/15/2017  PT End of Session - 07/15/17 1203    Visit Number  2    Number of Visits  9    Date for PT Re-Evaluation  08/12/17    PT Start Time  1021    PT Stop Time  1103    PT Time Calculation (min)  42 min    Activity Tolerance  Patient tolerated treatment well    Behavior During Therapy  [facility] for tasks assessed/performed       Past Medical History:  Diagnosis Date  . Abnormal CAT scan 2013  . Anxiety   . Cancer ([place])   . Family history of breast cancer   . Family history of pancreatic cancer   . Family history of prostate cancer   . GERD (gastroesophageal reflux disease)   . Hepatitis C 2001   Followed by Dr. Charlean Sanfilippo at [facility]  . PONV (postoperative nausea and vomiting)     Past Surgical History:  Procedure Laterality Date  . ABDOMINAL HYSTERECTOMY  2009  . breast cyst removal  1995  . BREAST LUMPECTOMY    . BREAST LUMPECTOMY WITH RADIOACTIVE SEED AND SENTINEL LYMPH NODE BIOPSY Left 06/14/2017   Procedure: LEFT BREAST SEED LOCALIZED LUMPECTOMY (2 SEEDS) AND SENTINEL LYMPH NODE BIOPSY;  Surgeon: Erroll Luna, MD;  Location: [facility];  Service: General;  Laterality: Left;  . COLONOSCOPY  2011  . FLEXIBLE SIGMOIDOSCOPY  February 12, 2013   have normal perianal exam, question focal prolapse. 2 small scars in the rectum.  Marland Kitchen HEMORRHOID BANDING  2012   3 times over three months  . SIGMOIDOSCOPY  2013  . TONSILECTOMY/ADENOIDECTOMY WITH MYRINGOTOMY  remote  . UPPER GI ENDOSCOPY  2013    There were no vitals filed for this visit.  Subjective Assessment - 07/15/17 1025    Subjective  I get set up Monday for radiation. My arm does not  feel good today. I went out of town and I didn't do a lot of stretching.     Pertinent History  Left breast cancer diagnosed 04/14/17 and had lumpectomy 06/14/17 with SLNB (3 nodes removed).  Expects to have radiation and has set-up on 07/18/17; will have 20 treatments. Has been on tamoxifen since January. Otherwise healthy. Did have a right shoulder impingement a year ago or so and says that is a lot better than it was.    Patient Stated Goals  to play golf    Currently in Pain?  No/denies    Pain Score  0-No pain                No data recorded       [facility] Adult PT Treatment/Exercise - 07/15/17 0001      Manual Therapy   Manual Therapy  Myofascial release;Passive ROM    Myofascial Release  to cording in left axilla and left antecubital fossa while moving arm gently into PROM    Passive ROM  to left shoulder with prolonged stretches in direction of flexion and abduction pt had approx 60% of full PROM to start & at end full ROM                  PT Long  Term Goals - 07/06/17 2113      PT LONG TERM GOAL #1   Title  Pt. will report at least 50% decrease in discomfort and greater ease in reaching left arm forward and up.    Time  4    Period  Weeks    Status  New      PT LONG TERM GOAL #2   Title  Pt. will be independent with home exercise program for shoulder ROM    Time  4    Period  Weeks    Status  New      PT LONG TERM GOAL #3   Title  Pt. will show at least 140 degrees of left shoulder abduction for improved ADLs.    Baseline  102 on left compared to 175 on right at eval    Time  4    Period  Weeks    Status  New      PT LONG TERM GOAL #4   Title  Pt. will be able to get into position for radiation and complete her radiation set-up.    Time  3    Period  Weeks    Status  New            Plan - 07/15/17 1204    Clinical Impression Statement  Focused today on improving L shoulder AROM by moving arm in PROM while providing myofascial release to  cording to in left axilla and antecubital fossa. Pt demonstrated approximately 60% of full AROM when she arrived to her appointment and at the end of session she had full PROM and about 80% full AROM. Her cording was much less visible at end of session and pt did not have pain with AROM.     Rehab Potential  Excellent    PT Frequency  2x / week    PT Duration  4 weeks    PT Treatment/Interventions  ADLs/Self Care Home Management;Moist Heat;Therapeutic exercise;Patient/family education;Manual techniques;Manual lymph drainage;Compression bandaging;Scar mobilization;Passive range of motion    PT Next Visit Plan  review, progress HEP; manual techniques (soft tissue mobilization, myofascial release) to decrease cording    PT Home Exercise Plan  standing dowel shoulder flexion and abduction; doorway stretch    Consulted and Agree with Plan of Care  Patient       Patient will benefit from skilled therapeutic intervention in order to improve the following deficits and impairments:  Decreased range of motion, Increased fascial restricitons, Pain, Impaired UE functional use  Visit Diagnosis: Stiffness of left shoulder, not elsewhere classified  Other symptoms and signs involving the musculoskeletal system     Problem List Patient Active Problem List   Diagnosis Date Noted  . Family history of pancreatic cancer   . Family history of prostate cancer   . Family history of breast cancer   . Aortic atherosclerosis ([place]) 04/28/2017  . Malignant neoplasm of upper-inner quadrant of left breast in female, estrogen receptor positive ([place]) 04/27/2017  . Hepatitis C, chronic ([place]) 09/21/2016  . Health care maintenance 07/29/2015  . Menopausal disorder 01/20/2014  . 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 Rectal pain 05/31/2013  . Hemorrhoids 05/31/2013  . Rectal fissure 11/20/2012  . 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 Bloating 10/03/2012  . Nausea 10/03/2012  . Anal pain 05/09/2012  .  Anxiety and depression 04/04/2012  . Diverticulosis 03/21/2012  . HEPATITIS C 01/08/2009  . SHORTNESS OF BREATH 01/08/2009    Allyson Sabal Onton 07/15/2017, 12:05 PM  [place], [place], [address] Phone: [phone]   Fax:  [phone]  Name: Courtney Romero MRN: [id number] Date of Birth: 12-04-1960  Manus Gunning, PT 07/15/17 12:06 PM

## 2017-07-18 ENCOUNTER — Ambulatory Visit
Admission: RE | Admit: 2017-07-18 | Discharge: 2017-07-18 | Disposition: A | Discharge status: Home / Self Care | Source: Ambulatory Visit | Attending: Radiation Oncology | Admitting: Radiation Oncology

## 2017-07-18 ENCOUNTER — Telehealth: Payer: Self-pay | Admitting: Genetics

## 2017-07-18 DIAGNOSIS — Z170 Estrogen receptor positive status [ER+]: Secondary | ICD-10-CM

## 2017-07-18 DIAGNOSIS — C50212 Malignant neoplasm of upper-inner quadrant of left female breast: Secondary | ICD-10-CM | POA: Insufficient documentation

## 2017-07-18 DIAGNOSIS — Z510 Encounter for antineoplastic radiation therapy: Secondary | ICD-10-CM | POA: Insufficient documentation

## 2017-07-18 NOTE — Telephone Encounter (Signed)
Revealed negative genetic testing.    This normal result is reassuring and indicates that it is unlikely Courtney Romero's cancer is due to a hereditary cause.  It is unlikely that there is an increased risk of another cancer due to a mutation in one of these genes.  However, genetic testing is not perfect, and cannot definitively rule out a hereditary cause.  It will be important for her to keep in contact with genetics to learn if any additional testing may be needed in the future.   Recommended her maternal relatives have genetic counseling as well due to the history of pancreatic and breast cancer on that side of the family .

## 2017-07-18 NOTE — Progress Notes (Signed)
Radiation Oncology         [phone] ________________________________  Name: Courtney Romero MRN: [id number]  Date: 07/18/2017  DOB: 09-21-1960  SIMULATION AND TREATMENT PLANNING NOTE   Special treatment procedure   Outpatient  DIAGNOSIS:     ICD-10-CM   1. Malignant neoplasm of upper-inner quadrant of left breast in female, estrogen receptor positive ([place]) C50.212    Z17.0     NARRATIVE:  The patient was brought to the [facility].  Identity was confirmed.  All relevant records and images related to the planned course of therapy were reviewed.  The patient freely provided informed written consent to proceed with treatment after reviewing the details related to the planned course of therapy. The consent form was witnessed and verified by the simulation staff.    Then, the patient was set-up in a stable reproducible supine position for radiation therapy with her ipsilateral arm over her head, and her upper body secured in a custom-made Vac-lok device.  CT images were obtained.  Surface markings were placed.  The CT images were loaded into the planning software.    Special treatment procedure:  Special treatment procedure was performed today due to the extra time and effort required by myself to plan and prepare this patient for deep inspiration breath hold technique.  I have determined cardiac sparing to be of benefit to this patient to prevent long term cardiac damage due to radiation of the heart.  Bellows were placed on the patient's abdomen. To facilitate cardiac sparing, the patient was coached by the radiation therapists on breath hold techniques and breathing practice was performed. Practice waveforms were obtained. The patient was then scanned while maintaining breath hold in the treatment position.  This image was then transferred over to the imaging specialist. The imaging specialist then created a fusion of the free breathing and breath hold scans using the chest  wall as the stable structure. I personally reviewed the fusion in axial, coronal and sagittal image planes.  Excellent cardiac sparing was obtained.  I felt the patient is an appropriate candidate for breath hold and the patient will be treated as such.  The image fusion was then reviewed with the patient to reinforce the necessity of reproducible breath hold.  TREATMENT PLANNING NOTE: Treatment planning then occurred.  The radiation prescription was entered and confirmed.     A total of 3 medically necessary complex treatment devices were fabricated and supervised by me: 2 fields with MLCs for custom blocks to protect heart, and lungs;  and, a Vac-lok. MORE COMPLEX DEVICES MAY BE MADE IN DOSIMETRY FOR FIELD IN FIELD BEAMS FOR DOSE HOMOGENEITY.  I have requested : 3D Simulation which is medically necessary to give adequate dose to at risk tissues while sparing lungs and heart.  I have requested a DVH of the following structures: lungs, heart, lumpectomy cavity.    The patient will receive 40.05 Gy in 15 fractions to the left breast with 2 tangential fields. This will be followed by a boost.  Optical Surface Tracking Plan:  Since intensity modulated radiotherapy (IMRT) and 3D conformal radiation treatment methods are predicated on accurate and precise positioning for treatment, intrafraction motion monitoring is medically necessary to ensure accurate and safe treatment delivery. The ability to quantify intrafraction motion without excessive ionizing radiation dose can only be performed with optical surface tracking. Accordingly, surface imaging offers the opportunity to obtain 3D measurements of patient position throughout IMRT and 3D treatments without excessive radiation  exposure. I am ordering optical surface tracking for this patient's upcoming course of radiotherapy.  ________________________________   Reference:  Ursula Alert, J, et al. Surface imaging-based analysis of  intrafraction motion for breast radiotherapy patients.Journal of [place], n. 6, nov. 2014. ISSN [id number].  Available at: <http://www.jacmp.org/index.php/jacmp/article/view/4957>.    -----------------------------------  Eppie Gibson, MD

## 2017-07-19 ENCOUNTER — Ambulatory Visit: Attending: Surgery

## 2017-07-19 DIAGNOSIS — Z483 Aftercare following surgery for neoplasm: Secondary | ICD-10-CM | POA: Diagnosis present

## 2017-07-19 DIAGNOSIS — M25612 Stiffness of left shoulder, not elsewhere classified: Secondary | ICD-10-CM | POA: Diagnosis present

## 2017-07-19 DIAGNOSIS — R29898 Other symptoms and signs involving the musculoskeletal system: Secondary | ICD-10-CM | POA: Diagnosis present

## 2017-07-19 NOTE — Therapy (Signed)
[place], [place], [address] Phone: [phone]   Fax:  [phone]  Physical Therapy Treatment  Patient Details  Name: Courtney Romero MRN: [id number] Date of Birth: 10/22/1960 Referring Provider: Dr. Erroll Luna   Encounter Date: 07/19/2017  PT End of Session - 07/19/17 1104    Visit Number  3    Number of Visits  9    Date for PT Re-Evaluation  08/12/17    PT Start Time  1024    PT Stop Time  1104    PT Time Calculation (min)  40 min    Activity Tolerance  Patient tolerated treatment well    Behavior During Therapy  [facility] for tasks assessed/performed       Past Medical History:  Diagnosis Date  . Abnormal CAT scan 2013  . Anxiety   . Cancer ([place])   . Family history of breast cancer   . Family history of pancreatic cancer   . Family history of prostate cancer   . GERD (gastroesophageal reflux disease)   . Hepatitis C 2001   Follow[facility] by Dr. Charlean Sanfilippo at [facility]  . PONV (postoperative nausea and vomiting)     Past Surgical History:  Procedure Laterality Date  . ABDOMINAL HYSTERECTOMY  2009  . breast cyst removal  1995  . BREAST LUMPECTOMY    . BREAST LUMPECTOMY WITH RADIOACTIVE SEED AND SENTINEL LYMPH NODE BIOPSY Left 06/14/2017   Procedure: LEFT BREAST SEED LOCALIZED LUMPECTOMY (2 SEEDS) AND SENTINEL LYMPH NODE BIOPSY;  Surgeon: Erroll Luna, MD;  Location: [place];  Service: General;  Laterality: Left;  . COLONOSCOPY  2011  . FLEXIBLE SIGMOIDOSCOPY  February 12, 2013   have normal perianal exam, question focal prolapse. 2 small scars in the rectum.  Marland Kitchen HEMORRHOID BANDING  2012   3 times over three months  . SIGMOIDOSCOPY  2013  . TONSILECTOMY/ADENOIDECTOMY WITH MYRINGOTOMY  remote  . UPPER GI ENDOSCOPY  2013    There were no vitals filed for this visit.  Subjective Assessment - 07/19/17 1027    Subjective  My ROM is getting better but when I was shaving my  Lt armpit today I saw one that was really noticeable. Been trying to work stretches in throughout day.     Pertinent History  Left breast cancer diagnosed 04/14/17 and had lumpectomy 06/14/17 with SLNB (3 nodes removed).  Expects to have radiation and has set-up on 07/18/17; will have 20 treatments. Has been on tamoxifen since January. Otherwise healthy. Did have a right shoulder impingement a year ago or so and says that is a lot better than it was.    Patient Stated Goals  to play golf    Currently in Pain?  No/denies                       [facility] Adult PT Treatment/Exercise - 07/19/17 0001      Manual Therapy   Manual Therapy  Myofascial release;Passive ROM    Myofascial Release  to cording in left axilla and left antecubital fossa while moving arm gently into PROM    Passive ROM  to left shoulder with prolonged stretches in direction of flexion, abduction, and D2                  PT Long Term Goals - 07/06/17 2113      PT LONG TERM GOAL #1   Title  Pt. will  report at least 50% decrease in discomfort and greater ease in reaching left arm forward and up.    Time  4    Period  Weeks    Status  New      PT LONG TERM GOAL #2   Title  Pt. will be independent with home exercise program for shoulder ROM    Time  4    Period  Weeks    Status  New      PT LONG TERM GOAL #3   Title  Pt. will show at least 140 degrees of left shoulder abduction for improved ADLs.    Baseline  102 on left compared to 175 on right at eval    Time  4    Period  Weeks    Status  New      PT LONG TERM GOAL #4   Title  Pt. will be able to get into position for radiation and complete her radiation set-up.    Time  3    Period  Weeks    Status  New            Plan - 07/19/17 1105    Clinical Impression Statement  Focused on continuing to stretch cording at Lt axilla and antecubital fossa. Pt reports feeling like cording in axilla is either old one coming back or new one so  focused here and cording was softer by end of session Her P/ROM continues to be improved since evaluation.     Rehab Potential  Excellent    PT Frequency  2x / week    PT Duration  4 weeks    PT Treatment/Interventions  ADLs/Self Care Home Management;Moist Heat;Therapeutic exercise;Patient/family education;Manual techniques;Manual lymph drainage;Compression bandaging;Scar mobilization;Passive range of motion    PT Next Visit Plan  review, progress HEP; manual techniques (soft tissue mobilization, myofascial release) to decrease cording    Consulted and Agree with Plan of Care  Patient       Patient will benefit from skilled therapeutic intervention in order to improve the following deficits and impairments:  Decreased range of motion, Increased fascial restricitons, Pain, Impaired UE functional use  Visit Diagnosis: Stiffness of left shoulder, not elsewhere classified  Other symptoms and signs involving the musculoskeletal system  Aftercare following surgery for neoplasm     Problem List Patient Active Problem List   Diagnosis Date Noted  . Family history of pancreatic cancer   . Family history of prostate cancer   . Family history of breast cancer   . Aortic atherosclerosis ([place]) 04/28/2017  . Malignant neoplasm of upper-inner quadrant of left breast in female, estrogen receptor positive ([place]) 04/27/2017  . Hepatitis C, chronic ([place]) 09/21/2016  . Health care maintenance 07/29/2015  . Menopausal disorder 01/20/2014  . 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 Rectal pain 05/31/2013  . Hemorrhoids 05/31/2013  . Rectal fissure 11/20/2012  . 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 Bloating 10/03/2012  . Nausea 10/03/2012  . Anal pain 05/09/2012  . Anxiety and depression 04/04/2012  . Diverticulosis 03/21/2012  . HEPATITIS C 01/08/2009  . SHORTNESS OF BREATH 01/08/2009    Otelia Limes, PTA 07/19/2017, 11:07 AM  [place], [place], [address] Phone: [phone]   Fax:  [phone]  Name: KHYLER URDA MRN: [id number] Date of Birth: 1960-05-11

## 2017-07-20 DIAGNOSIS — Z510 Encounter for antineoplastic radiation therapy: Secondary | ICD-10-CM | POA: Diagnosis not present

## 2017-07-21 ENCOUNTER — Ambulatory Visit: Admitting: Physical Therapy

## 2017-07-21 ENCOUNTER — Encounter: Payer: Self-pay | Admitting: Physical Therapy

## 2017-07-21 DIAGNOSIS — M25612 Stiffness of left shoulder, not elsewhere classified: Secondary | ICD-10-CM | POA: Diagnosis not present

## 2017-07-21 DIAGNOSIS — R29898 Other symptoms and signs involving the musculoskeletal system: Secondary | ICD-10-CM

## 2017-07-21 NOTE — Therapy (Signed)
[place], [place], [address] Phone: [phone]   Fax:  [phone]  Physical Therapy Treatment  Patient Details  Name: Courtney Romero MRN: [id number] Date of Birth: 1960/12/18 Referring Provider: Dr. Erroll Luna   Encounter Date: 07/21/2017  PT End of Session - 07/21/17 1348    Visit Number  4    Number of Visits  9    Date for PT Re-Evaluation  08/12/17    PT Start Time  1304    PT Stop Time  1346    PT Time Calculation (min)  42 min    Activity Tolerance  Patient tolerated treatment well    Behavior During Therapy  [facility] for tasks assessed/performed       Past Medical History:  Diagnosis Date  . Abnormal CAT scan 2013  . Anxiety   . Cancer ([place])   . Family history of breast cancer   . Family history of pancreatic cancer   . Family history of prostate cancer   . GERD (gastroesophageal reflux disease)   . Hepatitis C 2001   Followed by Dr. Charlean Sanfilippo at [facility]  . PONV (postoperative nausea and vomiting)     Past Surgical History:  Procedure Laterality Date  . ABDOMINAL HYSTERECTOMY  2009  . breast cyst removal  1995  . BREAST LUMPECTOMY    . BREAST LUMPECTOMY WITH RADIOACTIVE SEED AND SENTINEL LYMPH NODE BIOPSY Left 06/14/2017   Procedure: LEFT BREAST SEED LOCALIZED LUMPECTOMY (2 SEEDS) AND SENTINEL LYMPH NODE BIOPSY;  Surgeon: Erroll Luna, MD;  Location: [place];  Service: General;  Laterality: Left;  . COLONOSCOPY  2011  . FLEXIBLE SIGMOIDOSCOPY  February 12, 2013   have normal perianal exam, question focal prolapse. 2 small scars in the rectum.  Marland Kitchen HEMORRHOID BANDING  2012   3 times over three months  . SIGMOIDOSCOPY  2013  . TONSILECTOMY/ADENOIDECTOMY WITH MYRINGOTOMY  remote  . UPPER GI ENDOSCOPY  2013    There were no vitals filed for this visit.  Subjective Assessment - 07/21/17 1306    Subjective  I got a new cord in my armpit but I am not as  limited in my range of motion.     Pertinent History  Left breast cancer diagnosed 04/14/17 and had lumpectomy 06/14/17 with SLNB (3 nodes removed).  Expects to have radiation and has set-up on 07/18/17; will have 20 treatments. Has been on tamoxifen since January. Otherwise healthy. Did have a right shoulder impingement a year ago or so and says that is a lot better than it was.    Patient Stated Goals  to play golf    Currently in Pain?  No/denies    Pain Score  0-No pain                       [facility] Adult PT Treatment/Exercise - 07/21/17 0001      Manual Therapy   Manual Therapy  Myofascial release;Passive ROM    Myofascial Release  to cording in left axilla and left antecubital fossa while moving arm gently into PROM    Passive ROM  to left shoulder with prolonged stretches in direction of flexion, abduction, and D2                  PT Long Term Goals - 07/06/17 2113      PT LONG TERM GOAL #1   Title  Pt. will report at  least 50% decrease in discomfort and greater ease in reaching left arm forward and up.    Time  4    Period  Weeks    Status  New      PT LONG TERM GOAL #2   Title  Pt. will be independent with home exercise program for shoulder ROM    Time  4    Period  Weeks    Status  New      PT LONG TERM GOAL #3   Title  Pt. will show at least 140 degrees of left shoulder abduction for improved ADLs.    Baseline  102 on left compared to 175 on right at eval    Time  4    Period  Weeks    Status  New      PT LONG TERM GOAL #4   Title  Pt. will be able to get into position for radiation and complete her radiation set-up.    Time  3    Period  Weeks    Status  New            Plan - 07/21/17 1348    Clinical Impression Statement  Continued with myofascial release to cording in left axilla and upper arm. By end of session pt demonstrated full AROM in abduction and flexion. Her cords were significantly softer and no longer limiting her ROM.  Will begin teaching pt some gentle strengthening exercises at next session since she wants to begin going back to the gym.     Rehab Potential  Excellent    PT Frequency  2x / week    PT Duration  4 weeks    PT Treatment/Interventions  ADLs/Self Care Home Management;Moist Heat;Therapeutic exercise;Patient/family education;Manual techniques;Manual lymph drainage;Compression bandaging;Scar mobilization;Passive range of motion    PT Next Visit Plan  review, progress HEP give 3 way shoulder strengthening and possible strength ABC ; manual techniques (soft tissue mobilization, myofascial release) to decrease cording    PT Home Exercise Plan  standing dowel shoulder flexion and abduction; doorway stretch    Consulted and Agree with Plan of Care  Patient       Patient will benefit from skilled therapeutic intervention in order to improve the following deficits and impairments:  Decreased range of motion, Increased fascial restricitons, Pain, Impaired UE functional use  Visit Diagnosis: Stiffness of left shoulder, not elsewhere classified  Other symptoms and signs involving the musculoskeletal system     Problem List Patient Active Problem List   Diagnosis Date Noted  . Family history of pancreatic cancer   . Family history of prostate cancer   . Family history of breast cancer   . Aortic atherosclerosis ([place]) 04/28/2017  . Malignant neoplasm of upper-inner quadrant of left breast in female, estrogen receptor positive ([place]) 04/27/2017  . Hepatitis C, chronic ([place]) 09/21/2016  . Health care maintenance 07/29/2015  . Menopausal disorder 01/20/2014  . 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 Rectal pain 05/31/2013  . Hemorrhoids 05/31/2013  . Rectal fissure 11/20/2012  . 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 Bloating 10/03/2012  . Nausea 10/03/2012  . Anal pain 05/09/2012  . Anxiety and depression 04/04/2012  . Diverticulosis 03/21/2012  . HEPATITIS C 01/08/2009  . SHORTNESS OF  BREATH 01/08/2009    Allyson Sabal [facility] 07/21/2017, 1:50 PM  [place], [place], [address] Phone: [phone]   Fax:  [phone]  Name: Courtney Romero MRN: [id number]  Date of Birth: [birth year]  Allyson Sabal [place], PT 07/21/17 1:50 PM

## 2017-07-25 ENCOUNTER — Ambulatory Visit
Admission: RE | Admit: 2017-07-25 | Discharge: 2017-07-25 | Disposition: A | Discharge status: Home / Self Care | Source: Ambulatory Visit | Attending: Radiation Oncology | Admitting: Radiation Oncology

## 2017-07-25 DIAGNOSIS — Z510 Encounter for antineoplastic radiation therapy: Secondary | ICD-10-CM | POA: Diagnosis not present

## 2017-07-26 ENCOUNTER — Ambulatory Visit
Admission: RE | Admit: 2017-07-26 | Discharge: 2017-07-26 | Disposition: A | Discharge status: Home / Self Care | Source: Ambulatory Visit | Attending: Radiation Oncology | Admitting: Radiation Oncology

## 2017-07-26 ENCOUNTER — Ambulatory Visit

## 2017-07-26 DIAGNOSIS — C50212 Malignant neoplasm of upper-inner quadrant of left female breast: Secondary | ICD-10-CM

## 2017-07-26 DIAGNOSIS — Z510 Encounter for antineoplastic radiation therapy: Secondary | ICD-10-CM | POA: Diagnosis not present

## 2017-07-26 DIAGNOSIS — Z170 Estrogen receptor positive status [ER+]: Secondary | ICD-10-CM

## 2017-07-26 MED ORDER — RADIAPLEXRX EX GEL
Freq: Once | CUTANEOUS | Status: AC
  Administered 2017-07-26: 16:00:00 via TOPICAL

## 2017-07-26 MED ORDER — ALRA NON-METALLIC DEODORANT (RAD-ONC)
1.0000 "application " | Freq: Once | TOPICAL | Status: AC
  Administered 2017-07-26: 1 via TOPICAL

## 2017-07-26 NOTE — Progress Notes (Signed)

## 2017-07-27 ENCOUNTER — Ambulatory Visit
Admission: RE | Admit: 2017-07-27 | Discharge: 2017-07-27 | Disposition: A | Discharge status: Home / Self Care | Source: Ambulatory Visit | Attending: Radiation Oncology | Admitting: Radiation Oncology

## 2017-07-27 ENCOUNTER — Ambulatory Visit: Admitting: Rehabilitation

## 2017-07-27 DIAGNOSIS — Z510 Encounter for antineoplastic radiation therapy: Secondary | ICD-10-CM | POA: Diagnosis not present

## 2017-07-28 ENCOUNTER — Ambulatory Visit: Payer: Self-pay | Admitting: Genetics

## 2017-07-28 ENCOUNTER — Encounter: Payer: Self-pay | Admitting: Physical Therapy

## 2017-07-28 ENCOUNTER — Ambulatory Visit
Admission: RE | Admit: 2017-07-28 | Discharge: 2017-07-28 | Disposition: A | Discharge status: Home / Self Care | Source: Ambulatory Visit | Attending: Radiation Oncology | Admitting: Radiation Oncology

## 2017-07-28 ENCOUNTER — Ambulatory Visit: Admitting: Physical Therapy

## 2017-07-28 ENCOUNTER — Encounter: Payer: Self-pay | Admitting: Genetics

## 2017-07-28 DIAGNOSIS — M25612 Stiffness of left shoulder, not elsewhere classified: Secondary | ICD-10-CM

## 2017-07-28 DIAGNOSIS — Z8042 Family history of malignant neoplasm of prostate: Secondary | ICD-10-CM

## 2017-07-28 DIAGNOSIS — Z510 Encounter for antineoplastic radiation therapy: Secondary | ICD-10-CM | POA: Diagnosis not present

## 2017-07-28 DIAGNOSIS — Z803 Family history of malignant neoplasm of breast: Secondary | ICD-10-CM

## 2017-07-28 DIAGNOSIS — Z1379 Encounter for other screening for genetic and chromosomal anomalies: Secondary | ICD-10-CM

## 2017-07-28 DIAGNOSIS — R29898 Other symptoms and signs involving the musculoskeletal system: Secondary | ICD-10-CM

## 2017-07-28 DIAGNOSIS — C50412 Malignant neoplasm of upper-outer quadrant of left female breast: Secondary | ICD-10-CM

## 2017-07-28 DIAGNOSIS — Z800 Family history of malignant neoplasm of digestive organs: Secondary | ICD-10-CM

## 2017-07-28 DIAGNOSIS — C50212 Malignant neoplasm of upper-inner quadrant of left female breast: Secondary | ICD-10-CM

## 2017-07-28 DIAGNOSIS — Z170 Estrogen receptor positive status [ER+]: Secondary | ICD-10-CM

## 2017-07-28 NOTE — Progress Notes (Signed)
HPI: Courtney Romero was previously seen in the [place] clinic on 07/04/2017 due to a personal and family history of cancer and concerns regarding a hereditary predisposition to cancer. Please refer to our prior cancer genetics clinic note for more information regarding Courtney Romero's medical, social and family histories, and our assessment and recommendations, at the time. Courtney Romero recent genetic test results were disclosed to her, as well as recommendations warranted by these results. These results and recommendations are discussed in more detail below.  CANCER HISTORY:    Malignant neoplasm of upper-inner quadrant of left breast in female, estrogen receptor positive ([place])   04/27/2017 Initial Diagnosis    Malignant neoplasm of upper-inner quadrant of left breast in female, estrogen receptor positive ([place])      07/12/2017 Genetic Testing    The Common Hereditary Cancer Panel offered by [facility] includes sequencing and/or deletion duplication testing of the following 47 genes: APC, ATM, AXIN2, BARD1, BMPR1A, BRCA1, BRCA2, BRIP1, CDH1, CDKN2A (p14ARF), CDKN2A (p16INK4a), CKD4, CHEK2, CTNNA1, DICER1, EPCAM (Deletion/duplication testing only), GREM1 (promoter region deletion/duplication testing only), KIT, MEN1, MLH1, MSH2, MSH3, MSH6, MUTYH, NBN, NF1, NHTL1, PALB2, PDGFRA, PMS2, POLD1, POLE, PTEN, RAD50, RAD51C, RAD51D, SDHB, SDHC, SDHD, SMAD4, SMARCA4. STK11, TP53, TSC1, TSC2, and VHL.  The following genes were evaluated for sequence changes only: SDHA and HOXB13 c.251G>A variant only.  Results:  Negative, No pathogenic variants identified.  The date of this test report is 07/12/2017.         FAMILY HISTORY:  We obtained a detailed, 4-generation family history.  Significant diagnoses are listed below: Family History  Problem Relation Age of Onset  . Hypertension Mother   . Thyroid disease Mother        Multi problems  . Coronary artery disease Father   . Heart failure  Father   . Atrial fibrillation Father   . Diabetes Brother     Courtney Romero has no children.  Courtney Romero has 3 brothers in their 1's with no history of cancer.  No nieces/nephews with any cancer.   Courtney Romero father is 42 with a history of prostate cancer diagnosed in his 46's.  He had surgery and radiation to treat this cancer.  Courtney Romero has no biological paternal aunts/uncles. Courtney Romero paternal grandparents died in their 75's/90's with no history of cancer.    Courtney Romero mother died a couple of weeks ago at the age of 1 and had no history of cancer.  She had her uterus and ovaries removed in her 30's.  Courtney Romero has a maternal aunt who died of pancreatic cancer in her 95's.  This aunt had 5 children who have no history of cancer.  Courtney Romero has another aunt who is still living in her 93's with no history of cancer.  This aunt has 3 children with no history of cancer.  Courtney Romero maternal grandfather died in his 33's with no history of cancer.  Courtney Romero maternal grandmother died in her 40's with no history of cancer.  This grandmother had a sister who had breast cancer in her 86's.    Courtney Romero is unaware of previous family history of genetic testing for hereditary cancer risks. Patient's maternal ancestors are of [place] and [place] European descent, and paternal ancestors are of [place] descent. There is no reported Ashkenazi Jewish ancestry. There is no known consanguinity.  GENETIC TEST RESULTS: Genetic testing performed through [facility]'s  reported out on Common Hereditary Cancers Panel showed no pathogenic  mutations. The Common Hereditary Cancer Panel offered by [facility] includes sequencing and/or deletion duplication testing of the following 47 genes: APC, ATM, AXIN2, BARD1, BMPR1A, BRCA1, BRCA2, BRIP1, CDH1, CDKN2A (p14ARF), CDKN2A (p16INK4a), CKD4, CHEK2, CTNNA1, DICER1, EPCAM (Deletion/duplication testing only), GREM1 (promoter region  deletion/duplication testing only), KIT, MEN1, MLH1, MSH2, MSH3, MSH6, MUTYH, NBN, NF1, NHTL1, PALB2, PDGFRA, PMS2, POLD1, POLE, PTEN, RAD50, RAD51C, RAD51D, SDHB, SDHC, SDHD, SMAD4, SMARCA4. STK11, TP53, TSC1, TSC2, and VHL.  The following genes were evaluated for sequence changes only: SDHA and HOXB13 c.251G>A variant only..  The test report will be scanned into EPIC and will be located under the Molecular Pathology section of the Results Review tab.A portion of the result report is included below for reference.     We discussed with Courtney Romero genetic testing is not perfect.  It is possible there may be a gene mutation in one of these genes that current testing cannot detect, but that chance is small. We also discussed, that there could be another gene that has not yet been discovered, or that we have not yet tested, that is responsible for the cancer diagnoses in the family. It is also possible there is a hereditary cause for the cancer in the family that Courtney Romero did not inherit and therefore was not identified in her testing.  Therefore, it is important to remain in touch with cancer genetics in the future so that we can continue to offer Courtney Romero the most up to date genetic testing.   ADDITIONAL GENETIC TESTING: We discussed with Courtney Romero that there are other genes that are associated with increased cancer risk that can be analyzed. The laboratories that offer this testing look at these additional genes via a hereditary cancer gene panel. Should Ms. Kraynak wish to pursue additional genetic testing, we are happy to discuss and coordinate this testing, at any time.    CANCER SCREENING RECOMMENDATIONS: This result  indicates that it is unlikely Ms. Rueter has an increased risk for a future cancer due to a mutation in one of these genes. This normal test also suggests that Ms. Reitman's cancer was most likely not due to an inherited predisposition associated with one of these  genes.  Most cancers happen by chance and this negative test suggests that her cancer may fall into this category. Therefore, it is recommended she continue to follow the cancer management and screening guidelines provided by her oncology and primary healthcare provider. Other factors such as her personal and family history may still affect her cancer risk.  RECOMMENDATIONS FOR FAMILY MEMBERS: Individuals in this family might be at some increased risk of developing cancer, over the general population risk, simply due to the family history of cancer. We recommended women in this family have a yearly mammogram beginning at age 1, or 44 years younger than the earliest onset of cancer, an annual clinical breast exam, and perform monthly breast self-exams. Women in this family should also have a gynecological exam as recommended by their primary provider. All family members should have a colonoscopy by age 69 (or as directed by their physicians).  All family members should inform their physicians about the family history of cancer so their doctors can make the most appropriate screening recommendations for them.   Based on Ms. Fennewald's family history of pancreatic cancer, we recommended her siblings and maternal relatives also consider genetic counseling and testing. Ms. Wassmer will let us know if we can be of any assistance in coordinating genetic counseling  and/or testing for *these family members.   FOLLOW-UP: Lastly, we discussed with Ms. Mertens that cancer genetics is a rapidly advancing field and it is possible that new genetic tests will be appropriate for her and/or her family members in the future. We encouraged her to remain in contact with cancer genetics on an annual basis so we can update her personal and family histories and let her know of advances in cancer genetics that may benefit this family.   Our contact number was provided. Ms. Moder questions were answered to her  satisfaction, and she knows she is welcome to call us at anytime with additional questions or concerns.   Ferol Luz, MS, [facility] Certified Genetic Counselor [email or web address]

## 2017-07-28 NOTE — Therapy (Signed)
[place], [place], [address] Phone: [phone]   Fax:  [phone]  Physical Therapy Treatment  Patient Details  Name: Courtney Romero MRN: [id number] Date of Birth: 09-09-1960 Referring Provider: Dr. Erroll Luna   Encounter Date: 07/28/2017  PT End of Session - 07/28/17 1601    Visit Number  5    Number of Visits  9    Date for PT Re-Evaluation  08/12/17    PT Start Time  8338    PT Stop Time  1600    PT Time Calculation (min)  45 min    Activity Tolerance  Patient tolerated treatment well    Behavior During Therapy  [facility] for tasks assessed/performed       Past Medical History:  Diagnosis Date  . Abnormal CAT scan 2013  . Anxiety   . Cancer ([place])   . Family history of breast cancer   . Family history of pancreatic cancer   . Family history of prostate cancer   . GERD (gastroesophageal reflux disease)   . Hepatitis C 2001   Followed by Dr. Charlean Sanfilippo at [facility]  . PONV (postoperative nausea and vomiting)     Past Surgical History:  Procedure Laterality Date  . ABDOMINAL HYSTERECTOMY  2009  . breast cyst removal  1995  . BREAST LUMPECTOMY    . BREAST LUMPECTOMY WITH RADIOACTIVE SEED AND SENTINEL LYMPH NODE BIOPSY Left 06/14/2017   Procedure: LEFT BREAST SEED LOCALIZED LUMPECTOMY (2 SEEDS) AND SENTINEL LYMPH NODE BIOPSY;  Surgeon: Erroll Luna, MD;  Location: [facility];  Service: General;  Laterality: Left;  . COLONOSCOPY  2011  . FLEXIBLE SIGMOIDOSCOPY  February 12, 2013   have normal perianal exam, question focal prolapse. 2 small scars in the rectum.  Marland Kitchen HEMORRHOID BANDING  2012   3 times over three months  . SIGMOIDOSCOPY  2013  . TONSILECTOMY/ADENOIDECTOMY WITH MYRINGOTOMY  remote  . UPPER GI ENDOSCOPY  2013    There were no vitals filed for this visit.  Subjective Assessment - 07/28/17 1517    Subjective  I can really feel the cording when I extend my  hand.     Pertinent History  Left breast cancer diagnosed 04/14/17 and had lumpectomy 06/14/17 with SLNB (3 nodes removed).  Expects to have radiation and has set-up on 07/18/17; will have 20 treatments. Has been on tamoxifen since January. Otherwise healthy. Did have a right shoulder impingement a year ago or so and says that is a lot better than it was.    Patient Stated Goals  to play golf    Currently in Pain?  No/denies    Pain Score  0-No pain                       [facility] Adult PT Treatment/Exercise - 07/28/17 0001      Shoulder Exercises: Standing   Other Standing Exercises  Instructed pt in Strength ABC program and instructed pt in 10 reps of each exercises and hold stretches 30 secs bilaterally, using 2 lb weights, educated pt how to progress exercises also provided verbal cues on how to perform exercises correctly      Manual Therapy   Myofascial Release  to cording in left axilla and left antecubital fossa while moving arm gently into PROM                  PT Long Term Goals - 07/06/17  2113      PT LONG TERM GOAL #1   Title  Pt. will report at least 50% decrease in discomfort and greater ease in reaching left arm forward and up.    Time  4    Period  Weeks    Status  New      PT LONG TERM GOAL #2   Title  Pt. will be independent with home exercise program for shoulder ROM    Time  4    Period  Weeks    Status  New      PT LONG TERM GOAL #3   Title  Pt. will show at least 140 degrees of left shoulder abduction for improved ADLs.    Baseline  102 on left compared to 175 on right at eval    Time  4    Period  Weeks    Status  New      PT LONG TERM GOAL #4   Title  Pt. will be able to get into position for radiation and complete her radiation set-up.    Time  3    Period  Weeks    Status  New            Plan - 07/28/17 1601    Clinical Impression Statement  Instructed pt in Strength ABC program today since pt is wanting to return to  the gym for strengthening exercises. Educated pt how to progress exercises slowly to reduce risk of lymphedema. Continued myofascial work to cording which is prominent at left antecubital fossa.     Rehab Potential  Excellent    PT Frequency  2x / week    PT Duration  4 weeks    PT Treatment/Interventions  ADLs/Self Care Home Management;Moist Heat;Therapeutic exercise;Patient/family education;Manual techniques;Manual lymph drainage;Compression bandaging;Scar mobilization;Passive range of motion    PT Next Visit Plan  review Strength ABC program, progress HEP give 3 way shoulder strengthening ; manual techniques (soft tissue mobilization, myofascial release) to decrease cording    PT Home Exercise Plan  standing dowel shoulder flexion and abduction; doorway stretch, Strength ABC    Consulted and Agree with Plan of Care  Patient       Patient will benefit from skilled therapeutic intervention in order to improve the following deficits and impairments:  Decreased range of motion, Increased fascial restricitons, Pain, Impaired UE functional use  Visit Diagnosis: Stiffness of left shoulder, not elsewhere classified  Other symptoms and signs involving the musculoskeletal system     Problem List Patient Active Problem List   Diagnosis Date Noted  . Genetic testing 07/28/2017  . Family history of pancreatic cancer   . Family history of prostate cancer   . Family history of breast cancer   . Aortic atherosclerosis ([place]) 04/28/2017  . Malignant neoplasm of upper-inner quadrant of left breast in female, estrogen receptor positive ([place]) 04/27/2017  . Hepatitis C, chronic ([place]) 09/21/2016  . Health care maintenance 07/29/2015  . Menopausal disorder 01/20/2014  . 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 Rectal pain 05/31/2013  . Hemorrhoids 05/31/2013  . Rectal fissure 11/20/2012  . 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 Bloating 10/03/2012  . Nausea 10/03/2012  . Anal pain 05/09/2012  .  Anxiety and depression 04/04/2012  . Diverticulosis 03/21/2012  . HEPATITIS C 01/08/2009  . SHORTNESS OF BREATH 01/08/2009    Allyson Sabal [facility] 07/28/2017, 4:03 PM  [place], [place], [address] Phone: [phone]  Fax:  [phone]  Name: Courtney Romero MRN: [id number] Date of Birth: [birth year]  Manus Gunning, PT 07/28/17 4:04 PM

## 2017-07-29 ENCOUNTER — Ambulatory Visit
Admission: RE | Admit: 2017-07-29 | Discharge: 2017-07-29 | Disposition: A | Discharge status: Home / Self Care | Source: Ambulatory Visit | Attending: Radiation Oncology | Admitting: Radiation Oncology

## 2017-07-29 DIAGNOSIS — Z510 Encounter for antineoplastic radiation therapy: Secondary | ICD-10-CM | POA: Diagnosis not present

## 2017-08-01 ENCOUNTER — Ambulatory Visit
Admission: RE | Admit: 2017-08-01 | Discharge: 2017-08-01 | Disposition: A | Discharge status: Home / Self Care | Source: Ambulatory Visit | Attending: Radiation Oncology | Admitting: Radiation Oncology

## 2017-08-01 DIAGNOSIS — Z510 Encounter for antineoplastic radiation therapy: Secondary | ICD-10-CM | POA: Diagnosis not present

## 2017-08-02 ENCOUNTER — Ambulatory Visit
Admission: RE | Admit: 2017-08-02 | Discharge: 2017-08-02 | Disposition: A | Discharge status: Home / Self Care | Source: Ambulatory Visit | Attending: Radiation Oncology | Admitting: Radiation Oncology

## 2017-08-02 DIAGNOSIS — Z510 Encounter for antineoplastic radiation therapy: Secondary | ICD-10-CM | POA: Diagnosis not present

## 2017-08-03 ENCOUNTER — Ambulatory Visit: Admitting: Physical Therapy

## 2017-08-03 ENCOUNTER — Ambulatory Visit
Admission: RE | Admit: 2017-08-03 | Discharge: 2017-08-03 | Disposition: A | Discharge status: Home / Self Care | Source: Ambulatory Visit | Attending: Radiation Oncology | Admitting: Radiation Oncology

## 2017-08-03 DIAGNOSIS — M25612 Stiffness of left shoulder, not elsewhere classified: Secondary | ICD-10-CM | POA: Diagnosis not present

## 2017-08-03 DIAGNOSIS — Z510 Encounter for antineoplastic radiation therapy: Secondary | ICD-10-CM | POA: Diagnosis not present

## 2017-08-03 DIAGNOSIS — Z483 Aftercare following surgery for neoplasm: Secondary | ICD-10-CM

## 2017-08-03 DIAGNOSIS — R29898 Other symptoms and signs involving the musculoskeletal system: Secondary | ICD-10-CM

## 2017-08-03 NOTE — Therapy (Signed)
[place], [place], [address] Phone: [phone]   Fax:  [phone]  Physical Therapy Treatment  Patient Details  Name: Courtney Romero MRN: [id number] Date of Birth: August 03, 1960 Referring Provider: Dr. Erroll Luna   Encounter Date: 08/03/2017  PT End of Session - 08/03/17 1504    Visit Number  6    Number of Visits  9    Date for PT Re-Evaluation  08/12/17    PT Start Time  9833    PT Stop Time  1458    PT Time Calculation (min)  43 min    Activity Tolerance  Patient tolerated treatment well    Behavior During Therapy  [facility] for tasks assessed/performed       Past Medical History:  Diagnosis Date  . Abnormal CAT scan 2013  . Anxiety   . Cancer ([place])   . Family history of breast cancer   . Family history of pancreatic cancer   . Family history of prostate cancer   . GERD (gastroesophageal reflux disease)   . Hepatitis C 2001   Followed by Dr. Charlean Sanfilippo at [facility]  . PONV (postoperative nausea and vomiting)     Past Surgical History:  Procedure Laterality Date  . ABDOMINAL HYSTERECTOMY  2009  . breast cyst removal  1995  . BREAST LUMPECTOMY    . BREAST LUMPECTOMY WITH RADIOACTIVE SEED AND SENTINEL LYMPH NODE BIOPSY Left 06/14/2017   Procedure: LEFT BREAST SEED LOCALIZED LUMPECTOMY (2 SEEDS) AND SENTINEL LYMPH NODE BIOPSY;  Surgeon: Erroll Luna, MD;  Location: [facility];  Service: General;  Laterality: Left;  . COLONOSCOPY  2011  . FLEXIBLE SIGMOIDOSCOPY  February 12, 2013   have normal perianal exam, question focal prolapse. 2 small scars in the rectum.  Marland Kitchen HEMORRHOID BANDING  2012   3 times over three months  . SIGMOIDOSCOPY  2013  . TONSILECTOMY/ADENOIDECTOMY WITH MYRINGOTOMY  remote  . UPPER GI ENDOSCOPY  2013    There were no vitals filed for this visit.  Subjective Assessment - 08/03/17 1416    Subjective  "I'm feeling pretty good.  I started my radiation  last Monday; I'm in my second week." "My range of motion si good but I can still feel the pulling from the cording." I got  measured for my sleeve yesterday.  She had to order it.    Pertinent History  Left breast cancer diagnosed 04/14/17 and had lumpectomy 06/14/17 with SLNB (3 nodes removed).  Expects to have radiation and has set-up on 07/18/17; will have 20 treatments. Has been on tamoxifen since January. Otherwise healthy. Did have a right shoulder impingement a year ago or so and says that is a lot better than it was.    Currently in Pain?  Yes    Pain Score  3     Pain Location  Arm    Pain Orientation  Left    Pain Descriptors / Indicators  Other (Comment) pulling from cording    Aggravating Factors   lifting arm all the way up towards ceiling    Pain Relieving Factors  at rest         [facility] PT Assessment - 08/03/17 0001      AROM   Left Shoulder Flexion  158 Degrees    Left Shoulder ABduction  180 Degrees                   [facility] Adult PT Treatment/Exercise -  08/03/17 0001      Exercises   Exercises  Other Exercises    Other Exercises   discussed strength ABC program and patient feels confident that she knows what to do so doesn't need review      Shoulder Exercises: Standing   Other Standing Exercises  Instructred in 3-way arm raises x 10 each way with 3 lb. weights.      Manual Therapy   Manual Therapy  Soft tissue mobilization    Soft tissue mobilization  to cording in left antecubital fossa, upper arm and axilla with arm in full flexion    Myofascial Release  crosshands with arms in abduction from forearm to abdomen and from forearm to upper flank; also from upper arm to abdomen             PT Education - 08/03/17 1504    Education provided  Yes    Education Details  3-way arm raises with 3 lb. weights x 10 reps    Person(s) Educated  Patient    Methods  Explanation;Verbal cues    Comprehension  Verbalized understanding;Returned demonstration           PT Long Term Goals - 08/03/17 1507      PT LONG TERM GOAL #5   Title  Pt. will report cording has decreased to the point that it is difficult to palpate or see.    Time  2    Period  Weeks    Status  New            Plan - 08/03/17 1504    Clinical Impression Statement  Pt. feels confident about strength ABC program without review. Instructed in 3-way arm raises today, a new exercise for her. More focus on myofascial release and soft tissue mobilization with left shoulder in full flexion to try to release cording.  Able to achieve 1 "pop" of cord just superior to antecubital fossa today. Overall, patient reported feeling looser at end of session, but cording is still present.  Goals have been met and new goal written today.    Rehab Potential  Excellent    PT Frequency  2x / week    PT Duration  4 weeks    PT Treatment/Interventions  ADLs/Self Care Home Management;Moist Heat;Therapeutic exercise;Patient/family education;Manual techniques;Manual lymph drainage;Compression bandaging;Scar mobilization;Passive range of motion    PT Next Visit Plan  Continue soft tissue mobilization and myofascial release to try to decrease cording in left UE    PT Home Exercise Plan  standing dowel shoulder flexion and abduction; doorway stretch, Strength ABC, 3-way arm raises    Consulted and Agree with Plan of Care  Patient       Patient will benefit from skilled therapeutic intervention in order to improve the following deficits and impairments:  Decreased range of motion, Increased fascial restricitons, Pain, Impaired UE functional use  Visit Diagnosis: Stiffness of left shoulder, not elsewhere classified  Other symptoms and signs involving the musculoskeletal system  Aftercare following surgery for neoplasm     Problem List Patient Active Problem List   Diagnosis Date Noted  . Genetic testing 07/28/2017  . Family history of pancreatic cancer   . Family history of prostate  cancer   . Family history of breast cancer   . Aortic atherosclerosis ([place]) 04/28/2017  . Malignant neoplasm of upper-inner quadrant of left breast in female, estrogen receptor positive ([place]) 04/27/2017  . Hepatitis C, chronic ([place]) 09/21/2016  . Health care maintenance 07/29/2015  .  Menopausal disorder 01/20/2014  . 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 Rectal pain 05/31/2013  . Hemorrhoids 05/31/2013  . Rectal fissure 11/20/2012  . 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 Bloating 10/03/2012  . Nausea 10/03/2012  . Anal pain 05/09/2012  . Anxiety and depression 04/04/2012  . Diverticulosis 03/21/2012  . HEPATITIS C 01/08/2009  . SHORTNESS OF BREATH 01/08/2009    Elbridge Magowan 08/03/2017, 3:09 PM  [place], [place], [address] Phone: [phone]   Fax:  [phone]  Name: Courtney Romero MRN: [id number] Date of Birth: 23-Jul-1960  Serafina Royals, PT 08/03/17 3:09 PM

## 2017-08-04 ENCOUNTER — Ambulatory Visit
Admission: RE | Admit: 2017-08-04 | Discharge: 2017-08-04 | Disposition: A | Discharge status: Home / Self Care | Source: Ambulatory Visit | Attending: Radiation Oncology | Admitting: Radiation Oncology

## 2017-08-04 DIAGNOSIS — Z510 Encounter for antineoplastic radiation therapy: Secondary | ICD-10-CM | POA: Diagnosis not present

## 2017-08-05 ENCOUNTER — Ambulatory Visit
Admission: RE | Admit: 2017-08-05 | Discharge: 2017-08-05 | Disposition: A | Discharge status: Home / Self Care | Source: Ambulatory Visit | Attending: Radiation Oncology | Admitting: Radiation Oncology

## 2017-08-05 DIAGNOSIS — Z510 Encounter for antineoplastic radiation therapy: Secondary | ICD-10-CM | POA: Diagnosis not present

## 2017-08-08 ENCOUNTER — Ambulatory Visit: Admitting: Radiation Oncology

## 2017-08-08 ENCOUNTER — Ambulatory Visit
Admission: RE | Admit: 2017-08-08 | Discharge: 2017-08-08 | Disposition: A | Discharge status: Home / Self Care | Source: Ambulatory Visit | Attending: Radiation Oncology | Admitting: Radiation Oncology

## 2017-08-08 ENCOUNTER — Encounter: Payer: Self-pay | Admitting: Physical Therapy

## 2017-08-08 ENCOUNTER — Ambulatory Visit: Admitting: Physical Therapy

## 2017-08-08 DIAGNOSIS — M25612 Stiffness of left shoulder, not elsewhere classified: Secondary | ICD-10-CM

## 2017-08-08 DIAGNOSIS — R29898 Other symptoms and signs involving the musculoskeletal system: Secondary | ICD-10-CM

## 2017-08-08 DIAGNOSIS — Z510 Encounter for antineoplastic radiation therapy: Secondary | ICD-10-CM | POA: Diagnosis not present

## 2017-08-08 NOTE — Therapy (Signed)
[place], [place], [address] Phone: [phone]   Fax:  [phone]  Physical Therapy Treatment  Patient Details  Name: Courtney Romero MRN: [id number] Date of Birth: [birth year] Referring Provider: Dr. Erroll Luna   Encounter Date: 08/08/2017  PT End of Session - 08/08/17 1603    Visit Number  7    Number of Visits  9    Date for PT Re-Evaluation  08/12/17    PT Start Time  1522    PT Stop Time  1602    PT Time Calculation (min)  40 min    Activity Tolerance  Patient tolerated treatment well    Behavior During Therapy  [facility] for tasks assessed/performed       Past Medical History:  Diagnosis Date  . Abnormal CAT scan 2013  . Anxiety   . Cancer ([place])   . Family history of breast cancer   . Family history of pancreatic cancer   . Family history of prostate cancer   . GERD (gastroesophageal reflux disease)   . Hepatitis C 2001   Followed by Dr. Charlean Sanfilippo at [facility]  . PONV (postoperative nausea and vomiting)     Past Surgical History:  Procedure Laterality Date  . ABDOMINAL HYSTERECTOMY  2009  . breast cyst removal  1995  . BREAST LUMPECTOMY    . BREAST LUMPECTOMY WITH RADIOACTIVE SEED AND SENTINEL LYMPH NODE BIOPSY Left 06/14/2017   Procedure: LEFT BREAST SEED LOCALIZED LUMPECTOMY (2 SEEDS) AND SENTINEL LYMPH NODE BIOPSY;  Surgeon: Erroll Luna, MD;  Location: [facility];  Service: General;  Laterality: Left;  . COLONOSCOPY  2011  . FLEXIBLE SIGMOIDOSCOPY  February 12, 2013   have normal perianal exam, question focal prolapse. 2 small scars in the rectum.  Marland Kitchen HEMORRHOID BANDING  2012   3 times over three months  . SIGMOIDOSCOPY  2013  . TONSILECTOMY/ADENOIDECTOMY WITH MYRINGOTOMY  remote  . UPPER GI ENDOSCOPY  2013    There were no vitals filed for this visit.  Subjective Assessment - 08/08/17 1523    Subjective  She got a pop last week and it is feeling better  but I can feel it more from my elbow to my wrist.     Pertinent History  Left breast cancer diagnosed 04/14/17 and had lumpectomy 06/14/17 with SLNB (3 nodes removed).  Expects to have radiation and has set-up on 07/18/17; will have 20 treatments. Has been on tamoxifen since January. Otherwise healthy. Did have a right shoulder impingement a year ago or so and says that is a lot better than it was.    Patient Stated Goals  to play golf    Currently in Pain?  No/denies    Pain Score  0-No pain                       [facility] Adult PT Treatment/Exercise - 08/08/17 0001      Manual Therapy   Manual Therapy  Soft tissue mobilization    Soft tissue mobilization  to cording in left antecubital fossa, upper arm and axilla with arm in full flexion    Myofascial Release  crosshands with arms in abduction from forearm to abdomen and from forearm to upper flank; also from upper arm to abdomen                  PT Long Term Goals - 08/03/17 1507  PT LONG TERM GOAL #5   Title  Pt. will report cording has decreased to the point that it is difficult to palpate or see.    Time  2    Period  Weeks    Status  New            Plan - 08/08/17 1604    Clinical Impression Statement  Continued with myofascial release to cording. Pt states she is now able to stretch her arm further without being limited by cords and she has to stretch her arm further before feeling any pulling sensation in her cords. She felt looser at end of session and cording was very minimal. Most of her tightness that she does feel is from antecubital fossa to wrist. She is approaching discharge.     Rehab Potential  Excellent    PT Frequency  2x / week    PT Duration  4 weeks    PT Treatment/Interventions  ADLs/Self Care Home Management;Moist Heat;Therapeutic exercise;Patient/family education;Manual techniques;Manual lymph drainage;Compression bandaging;Scar mobilization;Passive range of motion    PT Next  Visit Plan  d/c?, Continue soft tissue mobilization and myofascial release to try to decrease cording in left UE    PT Home Exercise Plan  standing dowel shoulder flexion and abduction; doorway stretch, Strength ABC, 3-way arm raises    Consulted and Agree with Plan of Care  Patient       Patient will benefit from skilled therapeutic intervention in order to improve the following deficits and impairments:  Decreased range of motion, Increased fascial restricitons, Pain, Impaired UE functional use  Visit Diagnosis: Stiffness of left shoulder, not elsewhere classified  Other symptoms and signs involving the musculoskeletal system     Problem List Patient Active Problem List   Diagnosis Date Noted  . Genetic testing 07/28/2017  . Family history of pancreatic cancer   . Family history of prostate cancer   . Family history of breast cancer   . Aortic atherosclerosis ([place]) 04/28/2017  . Malignant neoplasm of upper-inner quadrant of left breast in female, estrogen receptor positive ([place]) 04/27/2017  . Hepatitis C, chronic ([place]) 09/21/2016  . Health care maintenance 07/29/2015  . Menopausal disorder 01/20/2014  . 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 Rectal pain 05/31/2013  . Hemorrhoids 05/31/2013  . Rectal fissure 11/20/2012  . 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 Bloating 10/03/2012  . Nausea 10/03/2012  . Anal pain 05/09/2012  . Anxiety and depression 04/04/2012  . Diverticulosis 03/21/2012  . HEPATITIS C 01/08/2009  . SHORTNESS OF BREATH 01/08/2009    Allyson Sabal [facility] 08/08/2017, 4:05 PM  [place], [place], [address] Phone: [phone]   Fax:  [phone]  Name: Courtney Romero MRN: [id number] Date of Birth: [birth year]  Manus Gunning, PT 08/08/17 4:06 PM

## 2017-08-09 ENCOUNTER — Ambulatory Visit
Admission: RE | Admit: 2017-08-09 | Discharge: 2017-08-09 | Disposition: A | Discharge status: Home / Self Care | Source: Ambulatory Visit | Attending: Radiation Oncology | Admitting: Radiation Oncology

## 2017-08-09 DIAGNOSIS — Z510 Encounter for antineoplastic radiation therapy: Secondary | ICD-10-CM | POA: Diagnosis not present

## 2017-08-10 ENCOUNTER — Ambulatory Visit
Admission: RE | Admit: 2017-08-10 | Discharge: 2017-08-10 | Disposition: A | Discharge status: Home / Self Care | Source: Ambulatory Visit | Attending: Radiation Oncology | Admitting: Radiation Oncology

## 2017-08-10 ENCOUNTER — Ambulatory Visit: Admitting: Physical Therapy

## 2017-08-10 DIAGNOSIS — M25612 Stiffness of left shoulder, not elsewhere classified: Secondary | ICD-10-CM

## 2017-08-10 DIAGNOSIS — Z510 Encounter for antineoplastic radiation therapy: Secondary | ICD-10-CM | POA: Diagnosis not present

## 2017-08-10 DIAGNOSIS — Z483 Aftercare following surgery for neoplasm: Secondary | ICD-10-CM

## 2017-08-10 DIAGNOSIS — R29898 Other symptoms and signs involving the musculoskeletal system: Secondary | ICD-10-CM

## 2017-08-10 NOTE — Therapy (Addendum)
[place], [place], [address] Phone: [phone]   Fax:  [phone]  Physical Therapy Treatment  Patient Details  Name: Courtney Romero MRN: [id number] Date of Birth: [birth year] Referring Provider: Dr. Erroll Luna   Encounter Date: 08/10/2017  PT End of Session - 08/10/17 1703    Visit Number  8    Number of Visits  9    Date for PT Re-Evaluation  08/12/17    PT Start Time  1423    PT Stop Time  1555 2 units is correct today    PT Time Calculation (min)  34 min    Activity Tolerance  Patient tolerated treatment well    Behavior During Therapy  [facility] for tasks assessed/performed       Past Medical History:  Diagnosis Date  . Abnormal CAT scan 2013  . Anxiety   . Cancer ([place])   . Family history of breast cancer   . Family history of pancreatic cancer   . Family history of prostate cancer   . GERD (gastroesophageal reflux disease)   . Hepatitis C 2001   Followed by Dr. Charlean Sanfilippo at [facility]  . PONV (postoperative nausea and vomiting)     Past Surgical History:  Procedure Laterality Date  . ABDOMINAL HYSTERECTOMY  2009  . breast cyst removal  1995  . BREAST LUMPECTOMY    . BREAST LUMPECTOMY WITH RADIOACTIVE SEED AND SENTINEL LYMPH NODE BIOPSY Left 06/14/2017   Procedure: LEFT BREAST SEED LOCALIZED LUMPECTOMY (2 SEEDS) AND SENTINEL LYMPH NODE BIOPSY;  Surgeon: Erroll Luna, MD;  Location: [place];  Service: General;  Laterality: Left;  . COLONOSCOPY  2011  . FLEXIBLE SIGMOIDOSCOPY  February 12, 2013   have normal perianal exam, question focal prolapse. 2 small scars in the rectum.  Marland Kitchen HEMORRHOID BANDING  2012   3 times over three months  . SIGMOIDOSCOPY  2013  . TONSILECTOMY/ADENOIDECTOMY WITH MYRINGOTOMY  remote  . UPPER GI ENDOSCOPY  2013    There were no vitals filed for this visit.  Subjective Assessment - 08/10/17 1523    Subjective  Arm is about the same.     Pertinent History  Left breast cancer diagnosed 04/14/17 and had lumpectomy 06/14/17 with SLNB (3 nodes removed).  Expects to have radiation and has set-up on 07/18/17; will have 20 treatments. Has been on tamoxifen since January. Otherwise healthy. Did have a right shoulder impingement a year ago or so and says that is a lot better than it was.    Currently in Pain?  No/denies just with stretching, in forearm                       [facility] Adult PT Treatment/Exercise - 08/10/17 0001      Manual Therapy   Soft tissue mobilization  to cording in left antecubital fossa, upper arm and axilla with arm in full flexion    Myofascial Release  crosshands across axilla from upper arm to abdomen and upper arm to lower axilla in supine; in right sidelying, left arm pulling with concurrent stretch at left flank in opposite direction and from left upper arm to left lower axilla.                  PT Long Term Goals - 08/03/17 1507      PT LONG TERM GOAL #5   Title  Pt. will report cording has decreased to the  point that it is difficult to palpate or see.    Time  2    Period  Weeks    Status  New            Plan - 08/10/17 1704    Clinical Impression Statement  Pt. did well with manual stretching and myofascial release again today. No "pops" were felt of the cording, but at end of session, patient raised her arm up and stretched, and reported it felt better.    Rehab Potential  Excellent    PT Frequency  2x / week    PT Duration  4 weeks    PT Treatment/Interventions  ADLs/Self Care Home Management;Moist Heat;Therapeutic exercise;Patient/family education;Manual techniques;Manual lymph drainage;Compression bandaging;Scar mobilization;Passive range of motion    PT Next Visit Plan  Will need DC or renewal  next visit. Check newer long term goal (#5). Continue soft tissue mobilization and myofascial release to try to decrease cording in left UE    PT Home Exercise Plan  standing  dowel shoulder flexion and abduction; doorway stretch, Strength ABC, 3-way arm raises    Consulted and Agree with Plan of Care  Patient       Patient will benefit from skilled therapeutic intervention in order to improve the following deficits and impairments:  Decreased range of motion, Increased fascial restricitons, Pain, Impaired UE functional use  Visit Diagnosis: Stiffness of left shoulder, not elsewhere classified  Other symptoms and signs involving the musculoskeletal system  Aftercare following surgery for neoplasm     Problem List Patient Active Problem List   Diagnosis Date Noted  . Genetic testing 07/28/2017  . Family history of pancreatic cancer   . Family history of prostate cancer   . Family history of breast cancer   . Aortic atherosclerosis ([place]) 04/28/2017  . Malignant neoplasm of upper-inner quadrant of left breast in female, estrogen receptor positive ([place]) 04/27/2017  . Hepatitis C, chronic ([place]) 09/21/2016  . Health care maintenance 07/29/2015  . Menopausal disorder 01/20/2014  . 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 Rectal pain 05/31/2013  . Hemorrhoids 05/31/2013  . Rectal fissure 11/20/2012  . 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 Bloating 10/03/2012  . Nausea 10/03/2012  . Anal pain 05/09/2012  . Anxiety and depression 04/04/2012  . Diverticulosis 03/21/2012  . HEPATITIS C 01/08/2009  . SHORTNESS OF BREATH 01/08/2009    Courtney Romero 08/10/2017, 5:08 PM  [place], [place], [address] Phone: [phone]   Fax:  [phone]  Name: Courtney Romero MRN: [id number] Date of Birth: [birth year]  Serafina Royals, PT 08/10/17 5:08 PM  PHYSICAL THERAPY DISCHARGE SUMMARY  Visits from Start of Care: 8  Current functional level related to goals / functional outcomes: See above   Remaining deficits: See above   Education / Equipment: HEP   Plan: Patient agrees to discharge.  Patient goals were partially met. Patient is being discharged due to not returning since the last visit.  ?????    Allyson Sabal [place], [place] 01/26/18 10:55 AM

## 2017-08-11 ENCOUNTER — Ambulatory Visit
Admission: RE | Admit: 2017-08-11 | Discharge: 2017-08-11 | Disposition: A | Discharge status: Home / Self Care | Source: Ambulatory Visit | Attending: Radiation Oncology | Admitting: Radiation Oncology

## 2017-08-11 DIAGNOSIS — Z510 Encounter for antineoplastic radiation therapy: Secondary | ICD-10-CM | POA: Diagnosis not present

## 2017-08-12 ENCOUNTER — Ambulatory Visit
Admission: RE | Admit: 2017-08-12 | Discharge: 2017-08-12 | Disposition: A | Discharge status: Home / Self Care | Source: Ambulatory Visit | Attending: Radiation Oncology | Admitting: Radiation Oncology

## 2017-08-12 DIAGNOSIS — Z510 Encounter for antineoplastic radiation therapy: Secondary | ICD-10-CM | POA: Diagnosis not present

## 2017-08-15 ENCOUNTER — Ambulatory Visit
Admission: RE | Admit: 2017-08-15 | Discharge: 2017-08-15 | Disposition: A | Discharge status: Home / Self Care | Source: Ambulatory Visit | Attending: Radiation Oncology | Admitting: Radiation Oncology

## 2017-08-15 DIAGNOSIS — Z510 Encounter for antineoplastic radiation therapy: Secondary | ICD-10-CM | POA: Diagnosis not present

## 2017-08-15 DIAGNOSIS — C50212 Malignant neoplasm of upper-inner quadrant of left female breast: Secondary | ICD-10-CM

## 2017-08-15 DIAGNOSIS — Z170 Estrogen receptor positive status [ER+]: Secondary | ICD-10-CM

## 2017-08-15 MED ORDER — RADIAPLEXRX EX GEL
Freq: Once | CUTANEOUS | Status: AC
  Administered 2017-08-15: 16:00:00 via TOPICAL

## 2017-08-16 ENCOUNTER — Ambulatory Visit
Admission: RE | Admit: 2017-08-16 | Discharge: 2017-08-16 | Disposition: A | Discharge status: Home / Self Care | Source: Ambulatory Visit | Attending: Radiation Oncology | Admitting: Radiation Oncology

## 2017-08-16 DIAGNOSIS — Z510 Encounter for antineoplastic radiation therapy: Secondary | ICD-10-CM | POA: Diagnosis not present

## 2017-08-17 ENCOUNTER — Ambulatory Visit
Admission: RE | Admit: 2017-08-17 | Discharge: 2017-08-17 | Disposition: A | Discharge status: Home / Self Care | Source: Ambulatory Visit | Attending: Radiation Oncology | Admitting: Radiation Oncology

## 2017-08-17 DIAGNOSIS — C50212 Malignant neoplasm of upper-inner quadrant of left female breast: Secondary | ICD-10-CM | POA: Insufficient documentation

## 2017-08-17 DIAGNOSIS — Z170 Estrogen receptor positive status [ER+]: Secondary | ICD-10-CM | POA: Diagnosis not present

## 2017-08-17 DIAGNOSIS — Z510 Encounter for antineoplastic radiation therapy: Secondary | ICD-10-CM | POA: Insufficient documentation

## 2017-08-18 ENCOUNTER — Ambulatory Visit
Admission: RE | Admit: 2017-08-18 | Discharge: 2017-08-18 | Disposition: A | Discharge status: Home / Self Care | Source: Ambulatory Visit | Attending: Radiation Oncology | Admitting: Radiation Oncology

## 2017-08-18 DIAGNOSIS — Z510 Encounter for antineoplastic radiation therapy: Secondary | ICD-10-CM | POA: Diagnosis not present

## 2017-08-19 ENCOUNTER — Ambulatory Visit
Admission: RE | Admit: 2017-08-19 | Discharge: 2017-08-19 | Disposition: A | Discharge status: Home / Self Care | Source: Ambulatory Visit | Attending: Radiation Oncology | Admitting: Radiation Oncology

## 2017-08-19 DIAGNOSIS — Z510 Encounter for antineoplastic radiation therapy: Secondary | ICD-10-CM | POA: Diagnosis not present

## 2017-08-22 ENCOUNTER — Ambulatory Visit
Admission: RE | Admit: 2017-08-22 | Discharge: 2017-08-22 | Disposition: A | Discharge status: Home / Self Care | Source: Ambulatory Visit | Attending: Radiation Oncology | Admitting: Radiation Oncology

## 2017-08-22 DIAGNOSIS — Z510 Encounter for antineoplastic radiation therapy: Secondary | ICD-10-CM | POA: Diagnosis not present

## 2017-08-26 ENCOUNTER — Encounter: Payer: Self-pay | Admitting: Radiation Oncology

## 2017-08-26 NOTE — Progress Notes (Signed)
  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  Beams/energy:  1)3D/ 6X 2)electrons / 12MeV  Narrative: The patient tolerated radiation treatment relatively well. She reports pain and soreness to her left nipple and left breast. She is using Radiaplex as directed, two times daily.   Plan: The patient has completed radiation treatment. The patient will return to radiation oncology clinic for routine followup in one month. I advised them to call or return sooner if they have any questions or concerns related to their recovery or treatment.  -----------------------------------  Eppie Gibson, MD  This document serves as a record of services personally performed by Eppie Gibson MD. It was created on her behalf by Delton Coombes, a trained medical scribe. The creation of this record is based on the scribe's personal observations and the provider's statements to them.

## 2017-09-07 ENCOUNTER — Other Ambulatory Visit: Payer: Self-pay | Admitting: Physician Assistant

## 2017-09-07 ENCOUNTER — Ambulatory Visit
Admission: RE | Admit: 2017-09-07 | Discharge: 2017-09-07 | Disposition: A | Discharge status: Home / Self Care | Source: Ambulatory Visit | Attending: Physician Assistant | Admitting: Physician Assistant

## 2017-09-07 ENCOUNTER — Ambulatory Visit

## 2017-09-07 DIAGNOSIS — M79661 Pain in right lower leg: Secondary | ICD-10-CM | POA: Diagnosis present

## 2017-09-07 DIAGNOSIS — M79604 Pain in right leg: Secondary | ICD-10-CM

## 2017-09-08 ENCOUNTER — Ambulatory Visit

## 2017-09-09 ENCOUNTER — Ambulatory Visit
Admission: RE | Admit: 2017-09-09 | Discharge: 2017-09-09 | Disposition: A | Discharge status: Home / Self Care | Source: Ambulatory Visit | Attending: Physician Assistant | Admitting: Physician Assistant

## 2017-09-09 DIAGNOSIS — M79661 Pain in right lower leg: Secondary | ICD-10-CM | POA: Diagnosis present

## 2017-09-09 DIAGNOSIS — M79604 Pain in right leg: Secondary | ICD-10-CM

## 2017-09-19 ENCOUNTER — Encounter: Payer: Self-pay | Admitting: Radiation Oncology

## 2017-09-19 NOTE — Progress Notes (Signed)
Courtney Romero presents for follow up of radiation completed 08/22/17 to her Left Breast. She has healed well from radiation. She has hyperpigmentation to her Left Breast. She continues to use radiaplex to her radiation site. She will switch to a vitamin E containing lotion when the radiaplex is completed. She has noted a recent lump to her Left axilla area. It is not painful. She reports it is very small in the morning and increased in size as she uses her Left arm during the day. She is taking Tamoxifen daily. She has an appointment with Dr. Jana Hakim on 09/27/17.   BP 124/88   Pulse 72   Temp 98.3 F (36.8 C)   Resp 18   Wt 151 lb (68.5 kg)   SpO2 99%   BMI 23.65 kg/m    Wt Readings from Last 3 Encounters:  09/21/17 151 lb (68.5 kg)  06/28/17 149 lb (67.6 kg)  06/23/17 148 lb 11.2 oz (67.4 kg)

## 2017-09-21 ENCOUNTER — Encounter: Payer: Self-pay | Admitting: Radiation Oncology

## 2017-09-21 ENCOUNTER — Ambulatory Visit
Admission: RE | Admit: 2017-09-21 | Discharge: 2017-09-21 | Disposition: A | Discharge status: Home / Self Care | Source: Ambulatory Visit | Attending: Radiation Oncology | Admitting: Radiation Oncology

## 2017-09-21 ENCOUNTER — Other Ambulatory Visit: Payer: Self-pay

## 2017-09-21 VITALS — BP 124/88 | HR 72 | Temp 98.3°F | Resp 18 | Wt 151.0 lb

## 2017-09-21 DIAGNOSIS — Z170 Estrogen receptor positive status [ER+]: Secondary | ICD-10-CM | POA: Diagnosis not present

## 2017-09-21 DIAGNOSIS — Z853 Personal history of malignant neoplasm of breast: Secondary | ICD-10-CM | POA: Insufficient documentation

## 2017-09-21 DIAGNOSIS — N6489 Other specified disorders of breast: Secondary | ICD-10-CM | POA: Insufficient documentation

## 2017-09-21 DIAGNOSIS — C50212 Malignant neoplasm of upper-inner quadrant of left female breast: Secondary | ICD-10-CM | POA: Insufficient documentation

## 2017-09-21 DIAGNOSIS — Z79899 Other long term (current) drug therapy: Secondary | ICD-10-CM | POA: Insufficient documentation

## 2017-09-21 HISTORY — DX: Personal history of irradiation: Z92.3

## 2017-09-21 NOTE — Progress Notes (Signed)
Radiation Oncology         [phone] ________________________________  Name: Courtney Romero MRN: [id number]  Date: 09/21/2017  DOB: Jan 18, 1961  Follow-Up Visit Note  Outpatient  CC: [name], MD  Magrinat, Virgie Dad, MD  Diagnosis and Prior Radiotherapy:    ICD-10-CM   1. 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 She continues to apply Radiaplex to the treated site. She reports a recent lump to her left axilla near the axillary scar that is not painful. She reports it is very small in the morning and increases in size as she uses her left arm throughout the day. She did see PT and reports her cording has resolved, and she denies any limited range of motion in her left shoulder. She is taking tamoxifen daily. She has an appointment with Dr. Jana Hakim on 09/27/17.                        ALLERGIES:  is allergic to propoxyphene; thioridazine hcl; bupropion; citalopram; duloxetine; thioridazine; and venlafaxine.  Meds: Current Outpatient Medications  Medication Sig Dispense Refill  . cholecalciferol (VITAMIN D) 1000 units tablet Take 1,000 Units by mouth daily.    Marland Kitchen LORazepam (ATIVAN) 0.5 MG tablet Take 1 mg by mouth as needed.     . Melatonin 5 MG TABS Take 1 tablet by mouth.    Marland Kitchen omeprazole (PRILOSEC) 40 MG capsule Take 40 mg by mouth daily.    . tamoxifen (NOLVADEX) 20 MG tablet Take 1 tablet (20 mg  total) by mouth daily. 90 tablet 4  . ondansetron (ZOFRAN ODT) 4 MG disintegrating tablet Take 1 tablet (4 mg total) by mouth every 8 (eight) hours as needed for nausea or vomiting. (Patient not taking: Reported on 06/28/2017) 20 tablet 0  . zolpidem (AMBIEN) 5 MG tablet Take by mouth as needed.      No current facility-administered medications for this encounter.     Physical Findings: The patient is in no acute distress. Patient is alert and oriented.  weight is 151 lb (68.5 kg). Her temperature is 98.3 F (36.8 C). Her blood pressure is 124/88 and her pulse is 72. Her respiration is 18 and oxygen saturation is 99%.    Her skin is slightly hyperpigmented but has healed very well in the treatment area. Residual mass consistent with seroma in her left axilla above axillary scar - soft texture, no sign of infection.   Lab Findings: Lab Results  Component Value Date   WBC 4.7 06/23/2017   HGB 13.8 06/23/2017   HCT 41.8 06/23/2017   MCV 92.3 06/23/2017   PLT 210 06/23/2017    Radiographic Findings: US Venous Img Lower Unilateral Right  Result Date: 09/07/2017 CLINICAL DATA:  Right leg pain for 1 month EXAM: RIGHT LOWER EXTREMITY VENOUS DOPPLER ULTRASOUND TECHNIQUE: Gray-scale sonography with graded compression, as well as color Doppler and duplex ultrasound were performed to evaluate the  lower extremity deep venous systems from the level of the common femoral vein and including the common femoral, femoral, profunda femoral, popliteal and calf veins including the posterior tibial, peroneal and gastrocnemius veins when visible. The superficial great saphenous vein was also interrogated. Spectral Doppler was utilized to evaluate flow at rest and with distal augmentation maneuvers in the common femoral, femoral and popliteal veins. COMPARISON:  None. FINDINGS: Contralateral Common Femoral Vein: Respiratory phasicity is normal and symmetric with the symptomatic side. No evidence of thrombus. Normal  compressibility. Common Femoral Vein: No evidence of thrombus. Normal compressibility, respiratory phasicity and response to augmentation. Saphenofemoral Junction: No evidence of thrombus. Normal compressibility and flow on color Doppler imaging. Profunda Femoral Vein: No evidence of thrombus. Normal compressibility and flow on color Doppler imaging. Femoral Vein: No evidence of thrombus. Normal compressibility, respiratory phasicity and response to augmentation. Popliteal Vein: No evidence of thrombus. Normal compressibility, respiratory phasicity and response to augmentation. Calf Veins: No evidence of thrombus. Normal compressibility and flow on color Doppler imaging. Superficial Great Saphenous Vein: No evidence of thrombus. Normal compressibility. Venous Reflux:  None. Other Findings:  None. IMPRESSION: No evidence of deep venous thrombosis. Electronically Signed   By: Inez Catalina M.D.   On: 09/07/2017 15:47   US Arterial Lower Extremity Duplex Right(non-abi)  Result Date: 09/09/2017 CLINICAL DATA:  57 year old female with right lower extremity claudication EXAM: RIGHT LOWER EXTREMITY ARTERIAL DUPLEX SCAN TECHNIQUE: Gray-scale sonography as well as color Doppler and duplex ultrasound was performed to evaluate the lower extremity arteries including the common, superficial and profunda femoral arteries, popliteal artery and calf arteries. COMPARISON:  None. FINDINGS: Right lower Extremity Inflow: Normal common femoral arterial waveforms and velocities. No evidence of inflow (aortoiliac) disease. Outflow: Normal profunda femoral, superficial femoral and popliteal arterial waveforms and velocities. No focal elevation of the PSV to suggest stenosis. Runoff: Normal posterior and anterior tibial arterial waveforms and velocities. Vessels are patent to the ankle. posterior and anterior tibial arterial waveforms and velocities. Vessels are patent to the ankle. IMPRESSION: Normal examination. No evidence of  hemodynamically significant stenosis or occlusion. Signed, Criselda Peaches, MD [facility] Electronically Signed   By: Jacqulynn Cadet M.D.   On: 09/09/2017 13:18    Impression/Plan: Healing well from radiotherapy to the breast tissue. She has what appears to be a seroma in her left axilla. I advised her to see Dr. Brantley Stage if it continues to increase in size or becomes firm.  Right now it waxes/wanes and is soft.  Continue skin care with topical Vitamin E Oil and / or lotion for at least 2 more months for further healing.  I encouraged her to continue with yearly mammography as appropriate (for intact breast tissue) and followup with medical oncology. I will see her back on an as-needed basis. I have encouraged her to call if she has any issues or concerns in the future. I wished her the very best.  _____________________________________   Eppie Gibson, MD  This document serves as a record of services personally performed by Eppie Gibson, MD. It was created on her behalf by Rae Lips, a trained medical scribe. The creation of this record is based on the scribe's personal observations and the provider's statements to them. This document has been checked and approved by the attending provider.

## 2017-09-26 NOTE — Progress Notes (Signed)
[facility]  [phone] [phone]     ID: Courtney Romero DOB: [birth year]  MR#: [id number]  KWI#:[id number]  Patient Care Team: [name], MD as PCP - General (Unknown Physician Specialty) Magrinat, Virgie Dad, MD as Consulting Physician (Oncology) Erroll Luna, MD as Consulting Physician (General Surgery) Dermatology, Marta Antu, MD as Referring Physician (Obstetrics and Gynecology) OTHER MD:  CHIEF COMPLAINT: Estrogen receptor positive breast cancer  CURRENT TREATMENT: Adjuvant radiation pending   HISTORY OF CURRENT ILLNESS: From the original intake note:   The patient underwent bilateral screening mammography with tomography at the breast center 03/29/2017.  There were some calcifications associated with possible architectural distortion in the left breast.  She was recalled for left unilateral diagnostic mammography with ultrasonography 04/04/2017.  This found the breast density to be category C.  In the upper outer left breast there was a 1.2 cm group of coarse calcifications with persistent distortion.  This was not palpable on exam.  Ultrasound showed a 1.0 cm irregular mass in the left breast at the 11 o'clock position 3 cm from the nipple and also a 1.5 area of posterior acoustic shadowing at the 12 o'clock position 4 cm from the nipple.  The latter was felt to correspond to the area of suspicious calcifications.  Accordingly on 04/08/2017 the patient underwent biopsy of the 2 areas in question. 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  Both biopsy site appeared identical.  Only one prognostic panel was sent, from the 11:00 biopsy, showing estrogen epidural 100% positive, estrogen receptor 100% positive, both with strong staining intensity, with an MIB-105%, and no  HER-2 amplification, the signals ratio being 1.35 and the number per cell 2.10.  She underwent breast MRI 04/28/2017 showing, in the left breast, an area of mass and non-masslike enhancement overall measuring up to 4 cm.  In addition there were 3 separate lesions in the right breast suspicious for malignancy, requiring additional biopsy.   The patient's subsequent history is as detailed below.  INTERVAL HISTORY: Courtney Romero returns today for a follow-up and treatment of her estrogen receptor positive breast cancer. She is accompanied by her husband.  She completed her radiation treatments on 08/22/2017. Which she tolerated well. Her nipple was the only area that had peeling. She was only fatigued the last week of treatment.  She completed genetics testing in March which showed no evidence of it at a deleterious mutation.  The patient continues on tamoxifen, which she tolerates well. She endorses occasional leg cramps that will wake her up at night. Adding they are mainly in her calves or her feet. She had hot flashes prior to starting tamoxifen and adds that they have gotten, "hotter." She denies vaginal discharge.   Since her last visit here she underwent a Right lower extremity Arterial Duplex Scan on 09/09/2017, which was normal to examination.  Lower extremity venous duplex on 09/07/2017 showed no evidence of DVT   REVIEW OF SYSTEMS: Courtney Romero is doing well overall. She has been staying busy. She has been working, playing golf once a week and going to the gym three times a week. The patient denies unusual headaches, visual changes, nausea, vomiting, or dizziness. There has been no unusual cough, phlegm production, or pleurisy. This been no change in bowel or bladder habits. The patient denies unexplained weight loss, bleeding, rash, or  fever. A detailed review of systems was otherwise noncontributory.    PAST MEDICAL HISTORY: Past Medical History:  Diagnosis Date  . Abnormal CAT scan  2013  . Anxiety   . Cancer ([place])   . Family history of breast cancer   . Family history of pancreatic cancer   . Family history of prostate cancer   . GERD (gastroesophageal reflux disease)   . Hepatitis C 2001   Followed by Dr. Charlean Sanfilippo at [facility]  . History of radiation therapy 07/26/17- 08/22/17   40.05 Gy directed to the left breast in 15 fractions followed by a boost of 10 Gy in 5 fractions.   Marland Kitchen PONV (postoperative nausea and vomiting)     PAST SURGICAL HISTORY: Past Surgical History:  Procedure Laterality Date  . ABDOMINAL HYSTERECTOMY  2009  . breast cyst removal  1995  . BREAST LUMPECTOMY    . BREAST LUMPECTOMY WITH RADIOACTIVE SEED AND SENTINEL LYMPH NODE BIOPSY Left 06/14/2017   Procedure: LEFT BREAST SEED LOCALIZED LUMPECTOMY (2 SEEDS) AND SENTINEL LYMPH NODE BIOPSY;  Surgeon: Erroll Luna, MD;  Location: [place];  Service: General;  Laterality: Left;  . COLONOSCOPY  2011  . FLEXIBLE SIGMOIDOSCOPY  February 12, 2013   have normal perianal exam, question focal prolapse. 2 small scars in the rectum.  Marland Kitchen HEMORRHOID BANDING  2012   3 times over three months  . SIGMOIDOSCOPY  2013  . TONSILECTOMY/ADENOIDECTOMY WITH MYRINGOTOMY  remote  . UPPER GI ENDOSCOPY  2013    FAMILY HISTORY Family History  Problem Relation Age of Onset  . Hypertension Mother   . Thyroid disease Mother        Multi problems  . Coronary artery disease Father   . Heart failure Father   . Atrial fibrillation Father   . Diabetes Brother    As of 2018, the patient's father is 10 years old.  As of March 2019 the patient's mother is under hospice care and not expected to survive the month.  The patient has three brothers and no sisters. Her maternal great aunt had breast cancer when she was after 7 and her other maternal great aunt had pancreatic cancer. She has no family history of ovarian cancer. The patient's father had prostate cancer in his 35's.   GYNECOLOGIC HISTORY:  No LMP  recorded. Patient has had a hysterectomy.  Menarche: 57 years old GP: She never carried a pregnancy to term LMP: Hysterectomy about 10 years ago, no salpingo-oophorectomy HRT: She was on Estradiol on and off the last three years. 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 She uses gloves when using colorings and other chemicals. Her husband Courtney Romero is retired and was in hosiery. They have one dog. She is not a Banker.     ADVANCED DIRECTIVES: Not in place   HEALTH MAINTENANCE: Social History   Tobacco Use  . Smoking status: Former Smoker    Packs/day: 1.50    Years: 20.00    Pack years: 30.00    Types: Cigarettes    Last attempt to quit: 04/19/1998    Years since quitting: 19.4  . Smokeless tobacco: Never Used  . Tobacco comment: quit smoking in 2000  Substance Use Topics  . Alcohol use: No  . Drug use: No     Colonoscopy: [facility] in [place]  PAP: Status post hysterectomy  Bone density: at 50 --does not know results   Allergies  Allergen Reactions  . Propoxyphene Rash    Uncoded Allergy. Allergen: mellaril, Other Reaction: Other reaction, double vision Uncoded Allergy. Allergen: mellaril, Other Reaction: Other reaction, double vision Uncoded Allergy. Allergen: mellaril, Other Reaction: Other reaction, double vision  . Thioridazine Hcl     REACTION: Reaction not known  . Bupropion Rash and Other (See Comments)    Insomnia Insomnia  . Citalopram Rash and Other (See Comments)    Lightheaded Lightheaded  . Duloxetine Rash and Other (See Comments)    Insomnia Insomnia  . Thioridazine Rash    REACTION: Reaction not known REACTION: Reaction not known  . Venlafaxine Rash and Other (See Comments)    Tolerated once, but then another time it caused malaise. Tolerated once, but then another time it caused malaise.    Current Outpatient Medications  Medication Sig Dispense Refill  . cholecalciferol (VITAMIN D) 1000 units  tablet Take 1,000 Units by mouth daily.    Marland Kitchen LORazepam (ATIVAN) 0.5 MG tablet Take 1 mg by mouth as needed.     . Melatonin 5 MG TABS Take 1 tablet by mouth.    Marland Kitchen omeprazole (PRILOSEC) 40 MG capsule Take 40 mg by mouth daily.    . ondansetron (ZOFRAN ODT) 4 MG disintegrating tablet Take 1 tablet (4 mg total) by mouth every 8 (eight) hours as needed for nausea or vomiting. (Patient not taking: Reported on 06/28/2017) 20 tablet 0  . tamoxifen (NOLVADEX) 20 MG tablet Take 1 tablet (20 mg total) by mouth daily. 90 tablet 4  . zolpidem (AMBIEN) 5 MG tablet Take by mouth as needed.      No current facility-administered medications for this visit.     OBJECTIVE: Middle-aged white woman in no acute distress  Vitals:   09/27/17 0920  BP: 131/78  Pulse: 68  Resp: 18  Temp: 97.8 F (36.6 C)  SpO2: 100%     Body mass index is 23.71 kg/m.   Wt Readings from Last 3 Encounters:  09/27/17 151 lb 6.4 oz (68.7 kg)  09/21/17 151 lb (68.5 kg)  06/28/17 149 lb (67.6 kg)      ECOG FS:1 - Symptomatic but completely ambulatory  Sclerae unicteric, pupils round and equal Oropharynx clear and moist No cervical or supraclavicular adenopathy Lungs no rales or rhonchi Heart regular rate and rhythm Abd soft, nontender, positive bowel sounds MSK no focal spinal tenderness, no upper extremity lymphedema Neuro: nonfocal, well oriented, moderately labile affect Breasts: The right breast is benign.  The left breast has undergone lumpectomy and radiation.  There is mild hyperpigmentation.  The cosmetic result is excellent.  There is no palpable abnormality in the left axilla.    LAB RESULTS:  CMP     Component Value Date/Time   NA 144 06/23/2017 0937   K 4.0 06/23/2017 0937   CL 110 (H) 06/23/2017 0937   CO2 26 06/23/2017 0937   GLUCOSE 98 06/23/2017 0937   BUN 12 06/23/2017 0937   CREATININE 0.77 06/23/2017 0937   CREATININE 0.91 04/29/2017 1348   CALCIUM 9.3 06/23/2017 0937   PROT 7.3 06/23/2017  0937   ALBUMIN 3.7 06/23/2017 0937   AST 15 06/23/2017 0937   AST 15 04/29/2017 1348   ALT 23 06/23/2017 0937   ALT 14 04/29/2017 1348   ALKPHOS 48 06/23/2017 0937   BILITOT 0.5 06/23/2017 0937   BILITOT 0.7 04/29/2017 1348   GFRNONAA >60 06/23/2017 0937   GFRNONAA >60 04/29/2017 1348   GFRAA >60 06/23/2017 5883  GFRAA >60 04/29/2017 1348    No results found for: TOTALPROTELP, ALBUMINELP, A1GS, A2GS, BETS, BETA2SER, GAMS, MSPIKE, SPEI  No results found for: KPAFRELGTCHN, LAMBDASER, KAPLAMBRATIO  Lab Results  Component Value Date   WBC 3.1 (L) 09/27/2017   NEUTROABS 1.7 09/27/2017   HGB 13.8 09/27/2017   HCT 41.8 09/27/2017   MCV 93.2 09/27/2017   PLT 147 09/27/2017    _0 @  No results found for: LABCA2  No components found for: [id number]  No results for input(s): INR in the last 168 hours.  No results found for: LABCA2  No results found for: OZH086  No results found for: VHQ469  No results found for: GEX528  No results found for: CA2729  No components found for: HGQUANT  No results found for: CEA1 / No results found for: CEA1   No results found for: AFPTUMOR  No results found for: CHROMOGRNA  No results found for: PSA1  Appointment on 09/27/2017  Component Date Value Ref Range Status  . WBC 09/27/2017 3.1* 3.9 - 10.3 K/uL Final  . RBC 09/27/2017 4.48  3.70 - 5.45 MIL/uL Final  . Hemoglobin 09/27/2017 13.8  11.6 - 15.9 g/dL Final  . HCT 09/27/2017 41.8  34.8 - 46.6 % Final  . MCV 09/27/2017 93.2  79.5 - 101.0 fL Final  . MCH 09/27/2017 30.7  25.1 - 34.0 pg Final  . MCHC 09/27/2017 33.0  31.5 - 36.0 g/dL Final  . RDW 09/27/2017 14.0  11.2 - 14.5 % Final  . Platelets 09/27/2017 147  145 - 400 K/uL Final  . Neutrophils Relative % 09/27/2017 56  % Final  . Neutro Abs 09/27/2017 1.7  1.5 - 6.5 K/uL Final  . Lymphocytes Relative 09/27/2017 31  % Final  . Lymphs Abs 09/27/2017 1.0  0.9 - 3.3 K/uL Final  . Monocytes Relative 09/27/2017 8  %  Final  . Monocytes Absolute 09/27/2017 0.3  0.1 - 0.9 K/uL Final  . Eosinophils Relative 09/27/2017 4  % Final  . Eosinophils Absolute 09/27/2017 0.1  0.0 - 0.5 K/uL Final  . Basophils Relative 09/27/2017 1  % Final  . Basophils Absolute 09/27/2017 0.0  0.0 - 0.1 K/uL Final   Performed at [facility], [address] [place], [place] [address]    (this displays the last labs from the last 3 days)  No results found for: TOTALPROTELP, ALBUMINELP, A1GS, A2GS, BETS, BETA2SER, GAMS, MSPIKE, SPEI (this displays SPEP labs)  No results found for: KPAFRELGTCHN, LAMBDASER, KAPLAMBRATIO (kappa/lambda light chains)  No results found for: HGBA, HGBA2QUANT, HGBFQUANT, HGBSQUAN (Hemoglobinopathy evaluation)   No results found for: LDH  No results found for: IRON, TIBC, IRONPCTSAT (Iron and TIBC)  No results found for: FERRITIN  Urinalysis    Component Value Date/Time   COLORURINE YELLOW (A) 06/12/2017 1911   APPEARANCEUR HAZY (A) 06/12/2017 1911   LABSPEC 1.025 06/12/2017 1911   PHURINE 5.0 06/12/2017 1911   GLUCOSEU NEGATIVE 06/12/2017 1911   HGBUR NEGATIVE 06/12/2017 1911   BILIRUBINUR NEGATIVE 06/12/2017 1911   KETONESUR NEGATIVE 06/12/2017 1911   PROTEINUR NEGATIVE 06/12/2017 1911   UROBILINOGEN 0.2 08/17/2011 0902   NITRITE NEGATIVE 06/12/2017 1911   LEUKOCYTESUR NEGATIVE 06/12/2017 1911     STUDIES: US Venous Img Lower Unilateral Right  Result Date: 09/07/2017 CLINICAL DATA:  Right leg pain for 1 month EXAM: RIGHT LOWER EXTREMITY VENOUS DOPPLER ULTRASOUND TECHNIQUE: Gray-scale sonography with graded compression, as well as color Doppler and duplex ultrasound were performed to evaluate  the lower extremity deep venous systems from the level of the common femoral vein and including the common femoral, femoral, profunda femoral, popliteal and calf veins including the posterior tibial, peroneal and gastrocnemius veins when visible. The superficial great  saphenous vein was also interrogated. Spectral Doppler was utilized to evaluate flow at rest and with distal augmentation maneuvers in the common femoral, femoral and popliteal veins. COMPARISON:  None. FINDINGS: Contralateral Common Femoral Vein: Respiratory phasicity is normal and symmetric with the symptomatic side. No evidence of thrombus. Normal compressibility. Common Femoral Vein: No evidence of thrombus. Normal compressibility, respiratory phasicity and response to augmentation. Saphenofemoral Junction: No evidence of thrombus. Normal compressibility and flow on color Doppler imaging. Profunda Femoral Vein: No evidence of thrombus. Normal compressibility and flow on color Doppler imaging. Femoral Vein: No evidence of thrombus. Normal compressibility, respiratory phasicity and response to augmentation. Popliteal Vein: No evidence of thrombus. Normal compressibility, respiratory phasicity and response to augmentation. Calf Veins: No evidence of thrombus. Normal compressibility and flow on color Doppler imaging. Superficial Great Saphenous Vein: No evidence of thrombus. Normal compressibility. Venous Reflux:  None. Other Findings:  None. IMPRESSION: No evidence of deep venous thrombosis. Electronically Signed   By: Inez Catalina M.D.   On: 09/07/2017 15:47   US Arterial Lower Extremity Duplex Right(non-abi)  Result Date: 09/09/2017 CLINICAL DATA:  57 year old female with right lower extremity claudication EXAM: RIGHT LOWER EXTREMITY ARTERIAL DUPLEX SCAN TECHNIQUE: Gray-scale sonography as well as color Doppler and duplex ultrasound was performed to evaluate the lower extremity arteries including the common, superficial and profunda femoral arteries, popliteal artery and calf arteries. COMPARISON:  None. FINDINGS: Right lower Extremity Inflow: Normal common femoral arterial waveforms and velocities. No evidence of inflow (aortoiliac) disease. Outflow: Normal profunda femoral, superficial femoral and popliteal  arterial waveforms and velocities. No focal elevation of the PSV to suggest stenosis. Runoff: Normal posterior and anterior tibial arterial waveforms and velocities. Vessels are patent to the ankle. posterior and anterior tibial arterial waveforms and velocities. Vessels are patent to the ankle. IMPRESSION: Normal examination. No evidence of hemodynamically significant stenosis or occlusion. Signed, Criselda Peaches, MD [facility] Electronically Signed   By: Jacqulynn Cadet M.D.   On: 09/09/2017 13:18    ELIGIBLE FOR AVAILABLE RESEARCH PROTOCOL: no  ASSESSMENT: 57 y.o. [place], [place] woman status post biopsy of 2 areas in the upper outer quadrant of the left breast 04/08/2017, both showing extensive ductal carcinoma in situ low-grade, but both showing invasive ductal carcinoma (measuring 2-3 mm on the slides), grade 1, estrogen and progesterone receptor positive, HER-2 not amplified, with an MIB-1 of 5%  (1) left lumpectomy and sentinel lymph node sampling 02/26/ 2019 showed only residual ductal carcinoma in situ, low-grade, with negative margins.  (a) a total of 3 axillary lymph nodes were removed  (2) adjuvant radiation 07/26/2017-08/22/2017 Site/dose:40.05 Gy directed to the left breast in 15 fractions followed by a boost of 10 Gy in 5 fractions  (3) genetics testing 07/12/2017 through theCommon Hereditary Cancer Panel offered by [facility] i found no deleterious mutations in APC, ATM, AXIN2, BARD1, BMPR1A, BRCA1, BRCA2, BRIP1, CDH1, CDKN2A (p14ARF), CDKN2A (p16INK4a), CKD4, CHEK2, CTNNA1, DICER1, EPCAM (Deletion/duplication testing only), GREM1 (promoter region deletion/duplication testing only), KIT, MEN1, MLH1, MSH2, MSH3, MSH6, MUTYH, NBN, NF1, NHTL1, PALB2, PDGFRA, PMS2, POLD1, POLE, PTEN, RAD50, RAD51C, RAD51D, SDHB, SDHC, SDHD, SMAD4, SMARCA4. S  (5) tamoxifen started neoadjuvantly 04/29/2017    PLAN: Courtney Romero has completed local  therapy for  her breast cancer and is continuing systemic therapy with tamoxifen.  She is tolerating this well.  She understands this will cut the risk of breast cancer in half.  The plan is to continue tamoxifen for a total of 5 years.  We reviewed her recent scans which showed no evidence of clot or claudication and also her CT scans which date back to 2016 and show very stable long "spots".  She understands that we do not need to continue yearly CT scans of the chest and that she does not want to be exposed to high doses of radiation  without a clear indication  She is appropriately concerned about the possibility of recurrence.  We discussed how this affects interactions in the marriage and also her reactions to the world in general and she admits to being somewhat irritable and somewhat tearful at times.  At this point I do not think she needs venlafaxine but I will be glad to add it if she thinks this is getting out of hand.  My main recommendation though is that she participate in finding her new normal.  Hopefully she will sign up  She will have mammography in October and see me shortly after that.  I think after that I could start seeing her on a once a year basis if all is stable.   Trevaris Pennella, Virgie Dad, MD  09/27/17 9:33 AM Medical Oncology and Hematology [facility] [address] [place], [place] [address] Tel. [phone]    Fax. [phone]  I, Margit Banda am acting as a scribe for Chauncey Cruel, MD.   I, Lurline Del MD, have reviewed the above documentation for accuracy and completeness, and I agree with the above.

## 2017-09-27 ENCOUNTER — Telehealth: Payer: Self-pay | Admitting: Oncology

## 2017-09-27 ENCOUNTER — Inpatient Hospital Stay: Attending: Oncology | Admitting: Oncology

## 2017-09-27 ENCOUNTER — Inpatient Hospital Stay

## 2017-09-27 VITALS — BP 131/78 | HR 68 | Temp 97.8°F | Resp 18 | Ht 67.0 in | Wt 151.4 lb

## 2017-09-27 DIAGNOSIS — I700 Atherosclerosis of aorta: Secondary | ICD-10-CM

## 2017-09-27 DIAGNOSIS — Z79810 Long term (current) use of selective estrogen receptor modulators (SERMs): Secondary | ICD-10-CM | POA: Diagnosis not present

## 2017-09-27 DIAGNOSIS — C50812 Malignant neoplasm of overlapping sites of left female breast: Secondary | ICD-10-CM | POA: Insufficient documentation

## 2017-09-27 DIAGNOSIS — B182 Chronic viral hepatitis C: Secondary | ICD-10-CM

## 2017-09-27 DIAGNOSIS — Z170 Estrogen receptor positive status [ER+]: Secondary | ICD-10-CM | POA: Insufficient documentation

## 2017-09-27 DIAGNOSIS — C50212 Malignant neoplasm of upper-inner quadrant of left female breast: Secondary | ICD-10-CM

## 2017-09-27 DIAGNOSIS — C50412 Malignant neoplasm of upper-outer quadrant of left female breast: Secondary | ICD-10-CM

## 2017-09-27 LAB — COMPREHENSIVE METABOLIC PANEL
ALT: 14 U/L (ref 0–55)
AST: 17 U/L (ref 5–34)
Albumin: 4.1 g/dL (ref 3.5–5.0)
Alkaline Phosphatase: 55 U/L (ref 40–150)
Anion gap: 9 (ref 3–11)
BUN: 14 mg/dL (ref 7–26)
CO2: 26 mmol/L (ref 22–29)
Calcium: 9.7 mg/dL (ref 8.4–10.4)
Chloride: 109 mmol/L (ref 98–109)
Creatinine, Ser: 0.8 mg/dL (ref 0.60–1.10)
GFR calc Af Amer: >60 mL/min (ref >60)
GFR calc non Af Amer: >60 mL/min (ref >60)
Glucose, Bld: 94 mg/dL (ref 70–140)
Potassium: 3.8 mmol/L (ref 3.5–5.1)
Sodium: 144 mmol/L (ref 136–145)
Total Bilirubin: 0.5 mg/dL (ref 0.2–1.2)
Total Protein: 7.4 g/dL (ref 6.4–8.3)

## 2017-09-27 LAB — CBC WITH DIFFERENTIAL/PLATELET
Basophils Absolute: 0 10*3/uL (ref 0.0–0.1)
Basophils Relative: 1 %
Eosinophils Absolute: 0.1 10*3/uL (ref 0.0–0.5)
Eosinophils Relative: 4 %
HCT: 41.8 % (ref 34.8–46.6)
Hemoglobin: 13.8 g/dL (ref 11.6–15.9)
Lymphocytes Relative: 31 %
Lymphs Abs: 1 10*3/uL (ref 0.9–3.3)
MCH: 30.7 pg (ref 25.1–34.0)
MCHC: 33 g/dL (ref 31.5–36.0)
MCV: 93.2 fL (ref 79.5–101.0)
Monocytes Absolute: 0.3 10*3/uL (ref 0.1–0.9)
Monocytes Relative: 8 %
Neutro Abs: 1.7 10*3/uL (ref 1.5–6.5)
Neutrophils Relative %: 56 %
Platelets: 147 10*3/uL (ref 145–400)
RBC: 4.48 MIL/uL (ref 3.70–5.45)
RDW: 14 % (ref 11.2–14.5)
WBC: 3.1 10*3/uL — ABNORMAL LOW (ref 3.9–10.3)

## 2017-09-27 NOTE — Telephone Encounter (Signed)
Gave patient avs and calendar of upcoming October appointments °

## 2017-09-28 ENCOUNTER — Encounter: Payer: Self-pay | Admitting: *Deleted

## 2017-10-12 ENCOUNTER — Ambulatory Visit (INDEPENDENT_AMBULATORY_CARE_PROVIDER_SITE_OTHER): Admitting: Gastroenterology

## 2017-10-12 ENCOUNTER — Encounter: Payer: Self-pay | Admitting: Gastroenterology

## 2017-10-12 VITALS — BP 132/88 | HR 76 | Ht 67.0 in | Wt 151.4 lb

## 2017-10-12 DIAGNOSIS — R0989 Other specified symptoms and signs involving the circulatory and respiratory systems: Secondary | ICD-10-CM

## 2017-10-12 DIAGNOSIS — F458 Other somatoform disorders: Secondary | ICD-10-CM | POA: Diagnosis not present

## 2017-10-12 DIAGNOSIS — Z86010 Personal history of colonic polyps: Secondary | ICD-10-CM

## 2017-10-12 DIAGNOSIS — R198 Other specified symptoms and signs involving the digestive system and abdomen: Secondary | ICD-10-CM

## 2017-10-12 NOTE — Addendum Note (Signed)
Addended by: Peggye Ley on: 10/12/2017 01:15 PM   Modules accepted: Orders, SmartSet

## 2017-10-12 NOTE — Progress Notes (Signed)
Jonathon Bellows MD, MRCP(U.K) [address]  [place]  [place], [place] [address]  Main: [phone]  Fax: [phone]   Gastroenterology Consultation  Referring Provider:     [name], MD Primary Care Physician:  [name], MD Primary Gastroenterologist:  Dr. Jonathon Bellows  Reason for Consultation:     Hemorrhoids and colonoscopy         HPI:   Courtney Romero is a 57 y.o. y/o female referred for consultation & management  by Dr. Ouida Sills, Ocie Cornfield, MD.  She has been referred for hemorrhoids and a colonoscopy .    She says she had her hemorrhoids banded a few years back. She says she has been seen by a colorectal surgeon at [facility] for the same. She says that she has lesions that look like hemorrhoids.Worse when she has multiple bowel movements .    She says that she was seen by an ENT 2 years for swallowing issues- feels like something in her throat all day long . She says she was told she had an inflamed larynx. Did not help. Some heartburn. No issues with swallowing. She is a [employer] park [employer]. Always feels it. Always clears her throat. Some post nasal drip. Has tried flonase and zyrtec which did not help.   She has had colon polyps in the past and is due to for a surveillance colonoscopy.   Past Medical History:  Diagnosis Date  . Abnormal CAT scan 2013  . Anxiety   . Cancer ([place])   . Family history of breast cancer   . Family history of pancreatic cancer   . Family history of prostate cancer   . GERD (gastroesophageal reflux disease)   . Hepatitis C 2001   Followed by Dr. Charlean Sanfilippo at [facility]  . History of radiation therapy 07/26/17- 08/22/17   40.05 Gy directed to the left breast in 15 fractions followed by a boost of 10 Gy in 5 fractions.   Marland Kitchen PONV (postoperative nausea and vomiting)     Past Surgical History:  Procedure Laterality Date  . ABDOMINAL HYSTERECTOMY  2009  . breast cyst removal  1995  . BREAST LUMPECTOMY    . BREAST LUMPECTOMY WITH  RADIOACTIVE SEED AND SENTINEL LYMPH NODE BIOPSY Left 06/14/2017   Procedure: LEFT BREAST SEED LOCALIZED LUMPECTOMY (2 SEEDS) AND SENTINEL LYMPH NODE BIOPSY;  Surgeon: Erroll Luna, MD;  Location: [place];  Service: General;  Laterality: Left;  . COLONOSCOPY  2011  . FLEXIBLE SIGMOIDOSCOPY  February 12, 2013   have normal perianal exam, question focal prolapse. 2 small scars in the rectum.  Marland Kitchen HEMORRHOID BANDING  2012   3 times over three months  . SIGMOIDOSCOPY  2013  . TONSILECTOMY/ADENOIDECTOMY WITH MYRINGOTOMY  remote  . UPPER GI ENDOSCOPY  2013    Prior to Admission medications   Medication Sig Start Date End Date Taking? Authorizing Provider  cholecalciferol (VITAMIN D) 1000 units tablet Take 1,000 Units by mouth daily.    [provider]  LORazepam (ATIVAN) 0.5 MG tablet Take 1 mg by mouth as needed.     [provider]  Melatonin 5 MG TABS Take 1 tablet by mouth.    [provider]  omeprazole (PRILOSEC) 40 MG capsule Take 40 mg by mouth daily.    [provider]  ondansetron (ZOFRAN ODT) 4 MG disintegrating tablet Take 1 tablet (4 mg total) by mouth every 8 (eight) hours as needed for nausea or vomiting. Patient  not taking: Reported on 06/28/2017 06/12/17   Darel Hong, MD  tamoxifen (NOLVADEX) 20 MG tablet Take 1 tablet (20 mg total) by mouth daily. 06/23/17   Magrinat, Virgie Dad, MD  zolpidem (AMBIEN) 5 MG tablet Take by mouth as needed.  02/06/15 05/19/17  [provider]    Family History  Problem Relation Age of Onset  . Hypertension Mother   . Thyroid disease Mother        Multi problems  . Coronary artery disease Father   . Heart failure Father   . Atrial fibrillation Father   . Diabetes Brother      Social History   Tobacco Use  . Smoking status: Former Smoker    Packs/day: 1.50    Years: 20.00    Pack years: 30.00    Types: Cigarettes    Last attempt to quit: 04/19/1998    Years since quitting:  19.4  . Smokeless tobacco: Never Used  . Tobacco comment: quit smoking in 2000  Substance Use Topics  . Alcohol use: No  . Drug use: No    Allergies as of 10/12/2017 - Review Complete 09/21/2017  Allergen Reaction Noted  . Propoxyphene Rash 02/06/2015  . Thioridazine hcl    . Bupropion Rash and Other (See Comments) 04/05/2012  . Citalopram Rash and Other (See Comments) 12/13/2013  . Duloxetine Rash and Other (See Comments) 04/05/2012  . Thioridazine Rash 02/24/2015  . Venlafaxine Rash and Other (See Comments) 04/05/2012    Review of Systems:    All systems reviewed and negative except where noted in HPI.   Physical Exam:  There were no vitals taken for this visit. No LMP recorded. Patient has had a hysterectomy. Psych:  Alert and cooperative. Normal mood and affect. General:   Alert,  Well-developed, well-nourished, pleasant and cooperative in NAD Head:  Normocephalic and atraumatic. Eyes:  Sclera clear, no icterus.   Conjunctiva pink. Ears:  Normal auditory acuity. Nose:  No deformity, discharge, or lesions. Mouth:  No deformity or lesions,oropharynx pink & moist. Neck:  Supple; no masses or thyromegaly. Lungs:  Respirations even and unlabored.  Clear throughout to auscultation.   No wheezes, crackles, or rhonchi. No acute distress. Heart:  Regular rate and rhythm; no murmurs, clicks, rubs, or gallops. Abdomen:  Normal bowel sounds.  No bruits.  Soft, non-tender and non-distended without masses, hepatosplenomegaly or hernias noted.  No guarding or rebound tenderness.    Neurologic:  Alert and oriented x3;  grossly normal neurologically. Skin:  Intact without significant lesions or rashes. No jaundice. Lymph Nodes:  No significant cervical adenopathy. Psych:  Alert and cooperative. Normal mood and affect.  Imaging Studies: No results found.  Assessment and Plan:   Courtney Romero is a 57 y.o. y/o female has been referred for hemorrhoids and a colonoscopy for colon  cancer screening. She also has had a history sugestive of globus sensation and possible post nasal drip   Plan  1. Colonoscopy - perform rectal exam unsedated when she comes for the procedure 2. Hemorrhoids patient information  Discussed care of hemorrhoids including high fiber diet , using good quality soft tissue paper , showering down and pat drying after a bowel movement , sitz bath at night , stool softeners if having hard stool, Anusol supp.  If no better with conservative RX which usually works in 50-60% then would need banding  3. EGD to evaluate for possible globus sensation   I have discussed alternative options, risks & benefits,  which include, but are not limited to, bleeding, infection, perforation,respiratory complication & drug reaction.  The patient agrees with this plan & written consent will be obtained.    Follow up in 8 weeks   Dr Jonathon Bellows MD,MRCP(U.K)

## 2017-11-07 ENCOUNTER — Ambulatory Visit
Admission: RE | Admit: 2017-11-07 | Discharge: 2017-11-07 | Disposition: A | Discharge status: Home / Self Care | Source: Ambulatory Visit | Attending: Surgery | Admitting: Surgery

## 2017-11-07 ENCOUNTER — Encounter: Admission: RE | Payer: Self-pay | Source: Ambulatory Visit

## 2017-11-07 ENCOUNTER — Other Ambulatory Visit: Payer: Self-pay | Admitting: Surgery

## 2017-11-07 ENCOUNTER — Ambulatory Visit: Admission: RE | Admit: 2017-11-07 | Source: Ambulatory Visit | Admitting: Gastroenterology

## 2017-11-07 ENCOUNTER — Telehealth: Payer: Self-pay | Admitting: Gastroenterology

## 2017-11-07 DIAGNOSIS — N611 Abscess of the breast and nipple: Secondary | ICD-10-CM

## 2017-11-07 HISTORY — DX: Personal history of irradiation: Z92.3

## 2017-11-07 SURGERY — ESOPHAGOGASTRODUODENOSCOPY (EGD) WITH PROPOFOL
Anesthesia: General

## 2017-11-07 NOTE — Telephone Encounter (Signed)
Patient states she has had breast cancer and woke up this morning with a red swollen breast so she is going to the surgeon in [place]. Patient states she was suppose to have a colonoscopy this morning 7.22.19 but needs to reschedule.

## 2017-11-07 NOTE — Telephone Encounter (Signed)
Spoke with pt and she states she will call back sometime next week to reschedule procedure

## 2017-11-08 ENCOUNTER — Encounter: Payer: Self-pay | Admitting: Oncology

## 2017-11-09 ENCOUNTER — Other Ambulatory Visit

## 2017-11-09 ENCOUNTER — Other Ambulatory Visit: Payer: Self-pay

## 2017-11-09 ENCOUNTER — Encounter

## 2017-11-09 MED ORDER — VENLAFAXINE HCL ER 37.5 MG PO CP24: 37.5000 mg | ORAL_CAPSULE | Freq: Every day | ORAL | 0 refills | Status: DC

## 2017-11-09 NOTE — Progress Notes (Signed)
Sent pt script for Venlafaxine XR 37.5mg  po daily for depression, per Dr.Magrinat. Pt will call in 2 weeks to update of how she is doing with medication. Sent effexor to [facility] per pt request.

## 2017-11-12 LAB — AEROBIC/ANAEROBIC CULTURE (SURGICAL/DEEP WOUND)

## 2017-11-12 LAB — AEROBIC/ANAEROBIC CULTURE W GRAM STAIN (SURGICAL/DEEP WOUND)
Culture: NORMAL
Special Requests: NORMAL

## 2017-11-17 ENCOUNTER — Other Ambulatory Visit: Payer: Self-pay | Admitting: Surgery

## 2017-11-17 ENCOUNTER — Ambulatory Visit
Admission: RE | Admit: 2017-11-17 | Discharge: 2017-11-17 | Disposition: A | Discharge status: Home / Self Care | Source: Ambulatory Visit | Attending: Surgery | Admitting: Surgery

## 2017-11-17 DIAGNOSIS — N611 Abscess of the breast and nipple: Secondary | ICD-10-CM

## 2017-11-24 ENCOUNTER — Other Ambulatory Visit

## 2017-11-28 ENCOUNTER — Other Ambulatory Visit: Payer: Self-pay

## 2017-11-28 DIAGNOSIS — Z86010 Personal history of colonic polyps: Secondary | ICD-10-CM

## 2017-11-28 DIAGNOSIS — R0989 Other specified symptoms and signs involving the circulatory and respiratory systems: Secondary | ICD-10-CM

## 2017-11-28 DIAGNOSIS — R198 Other specified symptoms and signs involving the digestive system and abdomen: Secondary | ICD-10-CM

## 2017-12-05 ENCOUNTER — Other Ambulatory Visit: Payer: Self-pay | Admitting: Oncology

## 2017-12-12 ENCOUNTER — Ambulatory Visit: Admitting: Certified Registered Nurse Anesthetist

## 2017-12-12 ENCOUNTER — Ambulatory Visit
Admission: RE | Admit: 2017-12-12 | Discharge: 2017-12-12 | Disposition: A | Discharge status: Home / Self Care | Source: Ambulatory Visit | Attending: Gastroenterology | Admitting: Gastroenterology

## 2017-12-12 ENCOUNTER — Encounter
Admission: RE | Disposition: A | Discharge status: Home / Self Care | Payer: Self-pay | Source: Ambulatory Visit | Attending: Gastroenterology

## 2017-12-12 ENCOUNTER — Encounter: Payer: Self-pay | Admitting: *Deleted

## 2017-12-12 DIAGNOSIS — D123 Benign neoplasm of transverse colon: Secondary | ICD-10-CM

## 2017-12-12 DIAGNOSIS — K635 Polyp of colon: Secondary | ICD-10-CM | POA: Diagnosis not present

## 2017-12-12 DIAGNOSIS — K317 Polyp of stomach and duodenum: Secondary | ICD-10-CM | POA: Diagnosis not present

## 2017-12-12 DIAGNOSIS — D125 Benign neoplasm of sigmoid colon: Secondary | ICD-10-CM | POA: Insufficient documentation

## 2017-12-12 DIAGNOSIS — K219 Gastro-esophageal reflux disease without esophagitis: Secondary | ICD-10-CM | POA: Insufficient documentation

## 2017-12-12 DIAGNOSIS — K641 Second degree hemorrhoids: Secondary | ICD-10-CM | POA: Diagnosis not present

## 2017-12-12 DIAGNOSIS — Z79899 Other long term (current) drug therapy: Secondary | ICD-10-CM | POA: Insufficient documentation

## 2017-12-12 DIAGNOSIS — F458 Other somatoform disorders: Secondary | ICD-10-CM | POA: Insufficient documentation

## 2017-12-12 DIAGNOSIS — Z87891 Personal history of nicotine dependence: Secondary | ICD-10-CM | POA: Diagnosis not present

## 2017-12-12 DIAGNOSIS — R198 Other specified symptoms and signs involving the digestive system and abdomen: Secondary | ICD-10-CM

## 2017-12-12 DIAGNOSIS — Z1211 Encounter for screening for malignant neoplasm of colon: Secondary | ICD-10-CM | POA: Insufficient documentation

## 2017-12-12 DIAGNOSIS — Z86010 Personal history of colonic polyps: Secondary | ICD-10-CM | POA: Diagnosis not present

## 2017-12-12 DIAGNOSIS — R0989 Other specified symptoms and signs involving the circulatory and respiratory systems: Secondary | ICD-10-CM

## 2017-12-12 HISTORY — PX: ESOPHAGOGASTRODUODENOSCOPY (EGD) WITH PROPOFOL: SHX5813

## 2017-12-12 HISTORY — PX: COLONOSCOPY WITH PROPOFOL: SHX5780

## 2017-12-12 SURGERY — COLONOSCOPY WITH PROPOFOL
Anesthesia: General

## 2017-12-12 MED ORDER — LIDOCAINE HCL (CARDIAC) PF 100 MG/5ML IV SOSY
PREFILLED_SYRINGE | INTRAVENOUS | Status: DC | PRN
  Administered 2017-12-12: 100 mg via INTRAVENOUS

## 2017-12-12 MED ORDER — PROPOFOL 10 MG/ML IV BOLUS
INTRAVENOUS | Status: DC | PRN
  Administered 2017-12-12: 10 mg via INTRAVENOUS
  Administered 2017-12-12: 70 mg via INTRAVENOUS

## 2017-12-12 MED ORDER — PROPOFOL 10 MG/ML IV BOLUS
INTRAVENOUS | Status: AC
  Filled 2017-12-12: qty 60

## 2017-12-12 MED ORDER — SODIUM CHLORIDE 0.9 % IV SOLN
INTRAVENOUS | Status: DC
  Administered 2017-12-12: 09:00:00 via INTRAVENOUS
  Administered 2017-12-12: 1000 mL via INTRAVENOUS

## 2017-12-12 MED ORDER — PROPOFOL 500 MG/50ML IV EMUL
INTRAVENOUS | Status: DC | PRN
  Administered 2017-12-12: 100 ug/kg/min via INTRAVENOUS

## 2017-12-12 NOTE — Anesthesia Preprocedure Evaluation (Signed)
Anesthesia Evaluation  Patient identified by MRN, date of birth, ID band Patient awake    History of Anesthesia Complications (+) PONV and history of anesthetic complications  Airway Mallampati: II       Dental   Pulmonary neg sleep apnea, neg COPD, former smoker,           Cardiovascular (-) hypertension+ Peripheral Vascular Disease  (-) Past MI and (-) CHF (-) dysrhythmias (-) Valvular Problems/Murmurs     Neuro/Psych neg Seizures Anxiety    GI/Hepatic GERD  Medicated,(+) Hepatitis - (Tx'd), C  Endo/Other  neg diabetes  Renal/GU negative Renal ROS     Musculoskeletal   Abdominal   Peds  Hematology   Anesthesia Other Findings   Reproductive/Obstetrics                            Anesthesia Physical Anesthesia Plan  ASA: II  Anesthesia Plan: General   Post-op Pain Management:    Induction:   PONV Risk Score and Plan: 3 and TIVA, Propofol infusion and Midazolam  Airway Management Planned: Nasal Cannula  Additional Equipment:   Intra-op Plan:   Post-operative Plan:   Informed Consent: I have reviewed the patients History and Physical, chart, labs and discussed the procedure including the risks, benefits and alternatives for the proposed anesthesia with the patient or authorized representative who has indicated his/her understanding and acceptance.     Plan Discussed with:   Anesthesia Plan Comments:         Anesthesia Quick Evaluation

## 2017-12-12 NOTE — Op Note (Signed)
[facility] Gastroenterology Patient Name: Courtney Romero Procedure Date: 12/12/2017 9:24 AM MRN: [id number] Account #: [id number] Date of Birth: [birth year] Admit Type: Outpatient Age: 57 Room: [facility] ENDO ROOM 4 Gender: Female Note Status: Finalized Procedure:            Upper GI endoscopy Indications:          Globus sensation Providers:            Jonathon Bellows MD, MD Referring MD:         Ocie Cornfield. Ouida Sills MD, MD (Referring MD) Medicines:            Monitored Anesthesia Care Complications:        No immediate complications. Procedure:            Pre-Anesthesia Assessment:                       - Prior to the procedure, a History and Physical was                        performed, and patient medications, allergies and                        sensitivities were reviewed. The patient's tolerance of                        previous anesthesia was reviewed.                       - The risks and benefits of the procedure and the                        sedation options and risks were discussed with the                        patient. All questions were answered and informed                        consent was obtained.                       - ASA Grade Assessment: II - A patient with mild                        systemic disease.                       After obtaining informed consent, the endoscope was                        passed under direct vision. Throughout the procedure,                        the patient's blood pressure, pulse, and oxygen                        saturations were monitored continuously. The Endoscope                        was introduced through the mouth, and advanced to the  third part of duodenum. The upper GI endoscopy was                        accomplished with ease. The patient tolerated the                        procedure well. Findings:      The examined duodenum was normal.      A few 3 mm sessile polyps with no  bleeding and no stigmata of recent       bleeding were found in the gastric body. Biopsies were taken with a cold       forceps for histology.      The cardia and gastric fundus were normal on retroflexion.      The examined esophagus was normal. Biopsies were obtained from the       proximal and distal esophagus with cold forceps for histology of       suspected eosinophilic esophagitis. Impression:           - Normal examined duodenum.                       - A few gastric polyps. Biopsied.                       - Normal esophagus. Biopsied. Recommendation:       - Discharge patient to home (with escort).                       - Resume previous diet.                       - Continue present medications.                       - Await pathology results.                       - Perform a colonoscopy today. Procedure Code(s):    --- Professional ---                       [id number], Esophagogastroduodenoscopy, flexible, transoral;                        with biopsy, single or multiple Diagnosis Code(s):    --- Professional ---                       K31.7, Polyp of stomach and duodenum                       F45.8, Other somatoform disorders CPT copyright 2017 American Medical Association. All rights reserved. The codes documented in this report are preliminary and upon coder review may  be revised to meet current compliance requirements. Jonathon Bellows, MD Jonathon Bellows MD, MD 12/12/2017 9:36:12 AM This report has been signed electronically. Number of Addenda: 0 Note Initiated On: 12/12/2017 9:24 AM      [facility]

## 2017-12-12 NOTE — Op Note (Signed)
[facility] Gastroenterology Patient Name: Courtney Romero Procedure Date: 12/12/2017 9:23 AM MRN: [id number] Account #: [id number] Date of Birth: 02-12-1961 Admit Type: Outpatient Age: 57 Room: [facility] ENDO ROOM 4 Gender: Female Note Status: Finalized Procedure:            Colonoscopy Indications:          High risk colon cancer surveillance: Personal history                        of colonic polyps Providers:            Jonathon Bellows MD, MD Referring MD:         Ocie Cornfield. Ouida Sills MD, MD (Referring MD) Medicines:            Monitored Anesthesia Care Complications:        No immediate complications. Procedure:            Pre-Anesthesia Assessment:                       - Prior to the procedure, a History and Physical was                        performed, and patient medications, allergies and                        sensitivities were reviewed. The patient's tolerance of                        previous anesthesia was reviewed.                       - The risks and benefits of the procedure and the                        sedation options and risks were discussed with the                        patient. All questions were answered and informed                        consent was obtained.                       - ASA Grade Assessment: III - A patient with severe                        systemic disease.                       After obtaining informed consent, the colonoscope was                        passed under direct vision. Throughout the procedure,                        the patient's blood pressure, pulse, and oxygen                        saturations were monitored continuously. The  Colonoscope was introduced through the anus and                        advanced to the the cecum, identified by the                        appendiceal orifice, IC valve and transillumination.                        The colonoscopy was performed with ease. The  patient                        tolerated the procedure well. The quality of the bowel                        preparation was good. Findings:      Hemorrhoids were found on perianal exam.      Two sessile polyps were found in the sigmoid colon and transverse colon.       The polyps were 3 to 4 mm in size. These polyps were removed with a cold       biopsy forceps. Resection and retrieval were complete.      Non-bleeding external and internal hemorrhoids were found during       retroflexion. The hemorrhoids were medium-sized and Grade II (internal       hemorrhoids that prolapse but reduce spontaneously).      The exam was otherwise without abnormality. Impression:           - Hemorrhoids found on perianal exam.                       - Two 3 to 4 mm polyps in the sigmoid colon and in the                        transverse colon, removed with a cold biopsy forceps.                        Resected and retrieved.                       - Non-bleeding external and internal hemorrhoids.                       - The examination was otherwise normal. Recommendation:       - Discharge patient to home (with escort).                       - Resume previous diet.                       - Continue present medications.                       - Await pathology results.                       - Repeat colonoscopy in 5 years for surveillance based                        on pathology results.                       -  Return to my office in 6 weeks. Procedure Code(s):    --- Professional ---                       [id number], Colonoscopy, flexible; with biopsy, single or                        multiple Diagnosis Code(s):    --- Professional ---                       Z86.010, Personal history of colonic polyps                       K64.1, Second degree hemorrhoids                       D12.5, Benign neoplasm of sigmoid colon                       D12.3, Benign neoplasm of transverse colon (hepatic                         flexure or splenic flexure) CPT copyright 2017 American Medical Association. All rights reserved. The codes documented in this report are preliminary and upon coder review may  be revised to meet current compliance requirements. Jonathon Bellows, MD Jonathon Bellows MD, MD 12/12/2017 9:58:59 AM This report has been signed electronically. Number of Addenda: 0 Note Initiated On: 12/12/2017 9:23 AM Scope Withdrawal Time: 0 hours 16 minutes 21 seconds  Total Procedure Duration: 0 hours 18 minutes 19 seconds       [facility]

## 2017-12-12 NOTE — Anesthesia Post-op Follow-up Note (Signed)
Anesthesia QCDR form completed.        

## 2017-12-12 NOTE — Anesthesia Postprocedure Evaluation (Signed)
Anesthesia Post Note  Patient: Courtney Romero  Procedure(s) Performed: COLONOSCOPY WITH PROPOFOL (N/A ) ESOPHAGOGASTRODUODENOSCOPY (EGD) WITH PROPOFOL (N/A )  Patient location during evaluation: Endoscopy Anesthesia Type: General Level of consciousness: awake and alert Pain management: pain level controlled Vital Signs Assessment: post-procedure vital signs reviewed and stable Respiratory status: spontaneous breathing and respiratory function stable Cardiovascular status: stable Anesthetic complications: no     Last Vitals:  Vitals:   12/12/17 0847 12/12/17 1003  BP: (!) 139/99   Pulse: 79   Resp: 18   Temp: (!) 36.1 C (!) 36.1 C  SpO2: 100%     Last Pain:  Vitals:   12/12/17 1003  TempSrc: Tympanic  PainSc: 0-No pain                 Mona Ayars K

## 2017-12-12 NOTE — Transfer of Care (Signed)
Immediate Anesthesia Transfer of Care Note  Patient: Courtney Romero  Procedure(s) Performed: COLONOSCOPY WITH PROPOFOL (N/A ) ESOPHAGOGASTRODUODENOSCOPY (EGD) WITH PROPOFOL (N/A )  Patient Location: Endoscopy Unit  Anesthesia Type:General  Level of Consciousness: awake, alert , oriented and patient cooperative  Airway & Oxygen Therapy: Patient Spontanous Breathing  Post-op Assessment: Report given to RN, Post -op Vital signs reviewed and stable and Patient moving all extremities  Post vital signs: Reviewed and stable  Last Vitals:  Vitals Value Taken Time  BP 109/80 12/12/2017 10:02 AM  Temp    Pulse 88 12/12/2017 10:03 AM  Resp 19 12/12/2017 10:03 AM  SpO2 99 % 12/12/2017 10:03 AM  Vitals shown include unvalidated device data.  Last Pain:  Vitals:   12/12/17 0847  TempSrc: Tympanic  PainSc: 0-No pain         Complications: No apparent anesthesia complications

## 2017-12-12 NOTE — H&P (Signed)
Jonathon Bellows, MD [address] [place], [place], [place], [address] [id number], [place], [place], [place], [address] Phone: [phone]  Fax: [phone]  Primary Care Physician:  [name], MD   Pre-Procedure History & Physical: HPI:  Courtney Romero is a 57 y.o. female is here for an endoscopy and colonoscopy    Past Medical History:  Diagnosis Date  . Abnormal CAT scan 2013  . Anxiety   . Cancer ([place])   . Family history of breast cancer   . Family history of pancreatic cancer   . Family history of prostate cancer   . GERD (gastroesophageal reflux disease)   . Hepatitis C 2001   Followed by Dr. Charlean Sanfilippo at [facility]  . History of radiation therapy 07/26/17- 08/22/17   40.05 Gy directed to the left breast in 15 fractions followed by a boost of 10 Gy in 5 fractions.   . Personal history of radiation therapy   . PONV (postoperative nausea and vomiting)     Past Surgical History:  Procedure Laterality Date  . ABDOMINAL HYSTERECTOMY  2009  . breast cyst removal  1995  . BREAST LUMPECTOMY    . BREAST LUMPECTOMY WITH RADIOACTIVE SEED AND SENTINEL LYMPH NODE BIOPSY Left 06/14/2017   Procedure: LEFT BREAST SEED LOCALIZED LUMPECTOMY (2 SEEDS) AND SENTINEL LYMPH NODE BIOPSY;  Surgeon: Erroll Luna, MD;  Location: [facility];  Service: General;  Laterality: Left;  . COLONOSCOPY  2011  . FLEXIBLE SIGMOIDOSCOPY  February 12, 2013   have normal perianal exam, question focal prolapse. 2 small scars in the rectum.  Marland Kitchen HEMORRHOID BANDING  2012   3 times over three months  . SIGMOIDOSCOPY  2013  . TONSILECTOMY/ADENOIDECTOMY WITH MYRINGOTOMY  remote  . UPPER GI ENDOSCOPY  2013    Prior to Admission medications   Medication Sig Start Date End Date Taking? Authorizing Provider  cholecalciferol (VITAMIN D) 1000 units tablet Take 1,000 Units by mouth daily.    [provider]  LORazepam (ATIVAN) 0.5 MG tablet Take 1 mg by mouth as  needed.     [provider]  Melatonin 5 MG TABS Take 1 tablet by mouth.    [provider]  omeprazole (PRILOSEC) 40 MG capsule Take 40 mg by mouth daily.    [provider]  ondansetron (ZOFRAN ODT) 4 MG disintegrating tablet Take 1 tablet (4 mg total) by mouth every 8 (eight) hours as needed for nausea or vomiting. Patient not taking: Reported on 06/28/2017 06/12/17   Darel Hong, MD  tamoxifen (NOLVADEX) 20 MG tablet Take 1 tablet (20 mg total) by mouth daily. 06/23/17   Magrinat, Virgie Dad, MD  zolpidem (AMBIEN) 5 MG tablet Take by mouth as needed.  02/06/15 05/19/17  [provider]    Allergies as of 11/28/2017 - Review Complete 10/12/2017  Allergen Reaction Noted  . Propoxyphene Rash 02/06/2015  . Thioridazine hcl    . Bupropion Rash and Other (See Comments) 04/05/2012  . Citalopram Rash and Other (See Comments) 12/13/2013  . Duloxetine Rash and Other (See Comments) 04/05/2012  . Thioridazine Rash 02/24/2015    Family History  Problem Relation Age of Onset  . Hypertension Mother   . Thyroid disease Mother        Multi problems  . Coronary artery disease Father   . Heart failure Father   . Atrial fibrillation Father   . Diabetes Brother  Social History   Socioeconomic History  . Marital status: Married    Spouse name: Not on file  . Number of children: Not on file  . Years of education: Not on file  . Highest education level: Not on file  Occupational History  . Occupation: Full Time  Social Needs  . Financial resource strain: Not on file  . Food insecurity:    Worry: Not on file    Inability: Not on file  . Transportation needs:    Medical: Not on file    Non-medical: Not on file  Tobacco Use  . Smoking status: Former Smoker    Packs/day: 1.50    Years: 20.00    Pack years: 30.00    Types: Cigarettes    Last attempt to quit: 04/19/1998    Years since quitting: 19.6  . Smokeless tobacco: Never Used  . Tobacco comment:  quit smoking in 2000  Substance and Sexual Activity  . Alcohol use: No  . Drug use: No  . Sexual activity: Yes  Lifestyle  . Physical activity:    Days per week: Not on file    Minutes per session: Not on file  . Stress: Not on file  Relationships  . Social connections:    Talks on phone: Not on file    Gets together: Not on file    Attends religious service: Not on file    Active member of club or organization: Not on file    Attends meetings of clubs or organizations: Not on file    Relationship status: Not on file  . Intimate partner violence:    Fear of current or ex partner: Not on file    Emotionally abused: Not on file    Physically abused: Not on file    Forced sexual activity: Not on file  Other Topics Concern  . Not on file  Social History Narrative   Regular exercise: Yes    Review of Systems: See HPI, otherwise negative ROS  Physical Exam: BP (!) 139/99   Pulse 79   Temp (!) 97 F (36.1 C) (Tympanic)   Resp 18   Ht 5\' 7"  (1.702 m)   Wt 68 kg   SpO2 100%   BMI 23.49 kg/m  General:   Alert,  pleasant and cooperative in NAD Head:  Normocephalic and atraumatic. Neck:  Supple; no masses or thyromegaly. Lungs:  Clear throughout to auscultation, normal respiratory effort.    Heart:  +S1, +S2, Regular rate and rhythm, No edema. Abdomen:  Soft, nontender and nondistended. Normal bowel sounds, without guarding, and without rebound.   Neurologic:  Alert and  oriented x4;  grossly normal neurologically.  Impression/Plan: Courtney Romero is here for an endoscopy and colonoscopy  to be performed for  evaluation of globus sensation and colon cancer screening with a prior history of colon polyps    Risks, benefits, limitations, and alternatives regarding endoscopy have been reviewed with the patient.  Questions have been answered.  All parties agreeable.   Jonathon Bellows, MD  12/12/2017, 9:17 AM

## 2017-12-13 ENCOUNTER — Encounter: Payer: Self-pay | Admitting: Gastroenterology

## 2017-12-13 LAB — SURGICAL PATHOLOGY

## 2017-12-20 ENCOUNTER — Encounter: Admitting: Adult Health

## 2017-12-25 ENCOUNTER — Encounter: Payer: Self-pay | Admitting: Gastroenterology

## 2017-12-25 ENCOUNTER — Encounter: Payer: Self-pay | Admitting: Oncology

## 2017-12-30 ENCOUNTER — Telehealth: Payer: Self-pay

## 2017-12-30 NOTE — Telephone Encounter (Signed)
Spoke with patient reminding of SCP with [place] on 01/02/18 at 1 pm.  Patient says she will come to appt.

## 2018-01-02 ENCOUNTER — Inpatient Hospital Stay: Attending: Oncology | Admitting: Adult Health

## 2018-01-02 VITALS — BP 134/77 | HR 72 | Temp 98.3°F | Resp 18 | Ht 67.0 in | Wt 152.7 lb

## 2018-01-02 DIAGNOSIS — Z79810 Long term (current) use of selective estrogen receptor modulators (SERMs): Secondary | ICD-10-CM | POA: Diagnosis not present

## 2018-01-02 DIAGNOSIS — Z170 Estrogen receptor positive status [ER+]: Secondary | ICD-10-CM | POA: Diagnosis not present

## 2018-01-02 DIAGNOSIS — C50212 Malignant neoplasm of upper-inner quadrant of left female breast: Secondary | ICD-10-CM | POA: Insufficient documentation

## 2018-01-02 NOTE — Progress Notes (Signed)
CLINIC:  Survivorship   REASON FOR VISIT:  Routine follow-up post-treatment for a recent history of breast cancer.  BRIEF ONCOLOGIC HISTORY:    Malignant neoplasm of upper-inner quadrant of left breast in female, estrogen receptor positive ([place])   04/08/2017 Initial Diagnosis    post biopsy of 2 areas in the upper outer quadrant of the left breast 04/08/2017, both showing extensive ductal carcinoma in situ low-grade, but both showing invasive ductal carcinoma (measuring 2-3 mm on the slides), grade 1, estrogen and progesterone receptor positive, HER-2 not amplified, with an MIB-1 of 5%    04/29/2017 -  Anti-estrogen oral therapy    tamoxifen started neoadjuvantly 04/29/2017     06/14/2017 Surgery    left lumpectomy and sentinel lymph node sampling showed only residual ductal carcinoma in situ, low-grade, with negative margins.             (a) a total of 3 axillary lymph nodes were removed    07/12/2017 Genetic Testing    The Common Hereditary Cancer Panel offered by [facility] includes sequencing and/or deletion duplication testing of the following 47 genes: APC, ATM, AXIN2, BARD1, BMPR1A, BRCA1, BRCA2, BRIP1, CDH1, CDKN2A (p14ARF), CDKN2A (p16INK4a), CKD4, CHEK2, CTNNA1, DICER1, EPCAM (Deletion/duplication testing only), GREM1 (promoter region deletion/duplication testing only), KIT, MEN1, MLH1, MSH2, MSH3, MSH6, MUTYH, NBN, NF1, NHTL1, PALB2, PDGFRA, PMS2, POLD1, POLE, PTEN, RAD50, RAD51C, RAD51D, SDHB, SDHC, SDHD, SMAD4, SMARCA4. STK11, TP53, TSC1, TSC2, and VHL.  The following genes were evaluated for sequence changes only: SDHA and HOXB13 c.251G>A variant only.  Results:  Negative, No pathogenic variants identified.  The date of this test report is 07/12/2017.     07/26/2017 - 08/22/2017 Radiation Therapy    Site/dose:40.05 Gy directed to the left breast in 15 fractions followed by a boost of 10 Gy in 5 fractions     INTERVAL HISTORY:  Ms. Bodkins presents to the [facility]  today for our initial meeting to review her survivorship care plan detailing her treatment course for breast cancer, as well as monitoring long-term side effects of that treatment, education regarding health maintenance, screening, and overall wellness and health promotion.     Overall, Ms. Prieur reports feeling quite well. She has had some issues with a post operative seroma that has been drained a few times.  She is seeing Dr. Brantley Stage about this later this week.  She is concerned about having a mammogram in October due to the seroma and the pain in her breast.  She also has f/u with Dr. Jana Hakim in later October.    Otherwise she is continuing on Tamoxifen with good tolerance.  She has some hot flashes, but is otherwise doing well.      REVIEW OF SYSTEMS:  Review of Systems  Constitutional: Negative for appetite change, chills, fatigue, fever and unexpected weight change.  HENT:   Negative for hearing loss and lump/mass.   Eyes: Negative for eye problems and icterus.  Respiratory: Negative for chest tightness, cough and shortness of breath.   Cardiovascular: Negative for chest pain, leg swelling and palpitations.  Gastrointestinal: Negative for abdominal distention, abdominal pain, constipation, diarrhea, nausea and vomiting.  Endocrine: Negative for hot flashes.  Musculoskeletal: Negative for arthralgias.  Skin: Negative for itching and rash.  Neurological: Negative for dizziness, extremity weakness, headaches and numbness.  Hematological: Negative for adenopathy. Does not bruise/bleed easily.  Psychiatric/Behavioral: Negative for depression. The patient is not nervous/anxious.   Breast: Denies any new nodularity, masses, tenderness, nipple changes, or nipple  discharge.      ONCOLOGY TREATMENT TEAM:  1. Surgeon:  Dr[facility]ley Stage at [facility] 2. Medical Oncologist: Dr. Jana Hakim  3. Radiation Oncologist: Dr. Isidore Moos    PAST MEDICAL/SURGICAL HISTORY:  Past Medical  History:  Diagnosis Date  . Abnormal CAT scan 2013  . Anxiety   . Cancer ([place])   . Family history of breast cancer   . Family history of pancreatic cancer   . Family history of prostate cancer   . GERD (gastroesophageal reflux disease)   . Hepatitis C 2001   Follow[facility] by [facility] Char[facility]an [facility] at [facility]  . History of radiation therapy 07/26/17- 08/22/17   40.05 Gy directed to the left breast in 15 fractions followed by a boost of 10 Gy in 5 fractions.   . Personal history of radiation therapy   . PONV (postoperative nausea and vomiting)    Past Surgical History:  Procedure Laterality Date  . ABDOMINAL HYSTERECTOMY  2009  . breast cyst removal  1995  . BREAST LUMPECTOMY    . BREAST LUMPECTOMY WITH RADIOACTIVE SEED AND SENTINEL LYMPH NODE BIOPSY Left 06/14/2017   Procedure: LEFT BREAST SEED LOCALIZED LUMPECTOMY (2 SEEDS) AND SENTINEL LYMPH NODE BIOPSY;  Surgeon: Erroll Luna, MD;  Location: [facility];  Service: General;  Laterality: Left;  . COLONOSCOPY  2011  . COLONOSCOPY WITH PROPOFOL N/A 12/12/2017   Procedure: COLONOSCOPY WITH PROPOFOL;  Surgeon: Jonathon Bellows, MD;  Location: [facility] ENDOSCOPY;  Service: Gastroenterology;  Laterality: N/A;  . ESOPHAGOGASTRODUODENOSCOPY (EGD) WITH PROPOFOL N/A 12/12/2017   Procedure: ESOPHAGOGASTRODUODENOSCOPY (EGD) WITH PROPOFOL;  Surgeon: Jonathon Bellows, MD;  Location: [facility] ENDOSCOPY;  Service: Gastroenterology;  Laterality: N/A;  . FLEXIBLE SIGMOIDOSCOPY  February 12, 2013   have normal perianal exam, question focal prolapse. 2 small scars in the rectum.  Marland Kitchen HEMORRHOID BANDING  2012   3 times over three months  . SIGMOIDOSCOPY  2013  . TONSILECTOMY/ADENOIDECTOMY WITH MYRINGOTOMY  remote  . UPPER GI ENDOSCOPY  2013     ALLERGIES:  Allergies  Allergen Reactions  . Propoxyphene Rash    Uncoded Allergy. Allergen: mellaril, Other Reaction: Other reaction, double vision Uncoded Allergy. Allergen: mellaril, Other Reaction: Other reaction, double  vision Uncoded Allergy. Allergen: mellaril, Other Reaction: Other reaction, double vision  . Thioridazine Hcl     REACTION: Reaction not known  . Bupropion Rash and Other (See Comments)    Insomnia Insomnia  . Citalopram Rash and Other (See Comments)    Lightheaded Lightheaded  . Duloxetine Rash and Other (See Comments)    Insomnia Insomnia  . Thioridazine Rash    REACTION: Reaction not known REACTION: Reaction not known     CURRENT MEDICATIONS:  Outpatient Encounter Medications as of 01/02/2018  Medication Sig Note  . cholecalciferol (VITAMIN D) 1000 units tablet Take 1,000 Units by mouth daily.   Marland Kitchen LORazepam (ATIVAN) 0.5 MG tablet Take 1 mg by mouth as needed.  02/24/2015: Received from: [facility]  . Melatonin 5 MG TABS Take 1 tablet by mouth.   Marland Kitchen omeprazole (PRILOSEC) 40 MG capsule Take 40 mg by mouth daily.   . tamoxifen (NOLVADEX) 20 MG tablet Take 1 tablet (20 mg total) by mouth daily.   Marland Kitchen zolpidem (AMBIEN) 5 MG tablet Take by mouth as needed.  02/24/2015: Received from: [facility]  . [DISCONTINUED] ondansetron (ZOFRAN ODT) 4 MG disintegrating tablet Take 1 tablet (4 mg total) by mouth every 8 (eight) hours as needed for nausea or  vomiting. (Patient not taking: Reported on 06/28/2017) 06/14/2017: Have but not taken   No facility-administered encounter medications on file as of 01/02/2018.      ONCOLOGIC FAMILY HISTORY:  Family History  Problem Relation Age of Onset  . Hypertension Mother   . Thyroid disease Mother        Multi problems  . Coronary artery disease Father   . Heart failure Father   . Atrial fibrillation Father   . Diabetes Brother      GENETIC COUNSELING/TESTING: See above  SOCIAL HISTORY:  Social History   Socioeconomic History  . Marital status: Married    Spouse name: Not on file  . Number of children: Not on file  . Years of education: Not on file  . Highest education level: Not on file  Occupational History  .  Occupation: Full Time  Social Needs  . Financial resource strain: Not on file  . Food insecurity:    Worry: Not on file    Inability: Not on file  . Transportation needs:    Medical: Not on file    Non-medical: Not on file  Tobacco Use  . Smoking status: Former Smoker    Packs/day: 1.50    Years: 20.00    Pack years: 30.00    Types: Cigarettes    Last attempt to quit: 04/19/1998    Years since quitting: 19.7  . Smokeless tobacco: Never Used  . Tobacco comment: quit smoking in 2000  Substance and Sexual Activity  . Alcohol use: No  . Drug use: No  . Sexual activity: Yes  Lifestyle  . Physical activity:    Days per week: Not on file    Minutes per session: Not on file  . Stress: Not on file  Relationships  . Social connections:    Talks on phone: Not on file    Gets together: Not on file    Attends religious service: Not on file    Active member of club or organization: Not on file    Attends meetings of clubs or organizations: Not on file    Relationship status: Not on file  . Intimate partner violence:    Fear of current or ex partner: Not on file    Emotionally abused: Not on file    Physically abused: Not on file    Forced sexual activity: Not on file  Other Topics Concern  . Not on file  Social History Narrative   Regular exercise: Yes     PHYSICAL EXAMINATION:  Vital Signs:   Vitals:   01/02/18 1249  BP: 134/77  Pulse: 72  Resp: 18  Temp: 98.3 F (36.8 C)  SpO2: 100%   Filed Weights   01/02/18 1249  Weight: 152 lb 11.2 oz (69.3 kg)   General: Well-nourished, well-appearing female in no acute distress.  She is unaccompanied today.   HEENT: Head is normocephalic.  Pupils equal and reactive to light. Conjunctivae clear without exudate.  Sclerae anicteric. Oral mucosa is pink, moist.  Oropharynx is pink without lesions or erythema.  Lymph: No cervical, supraclavicular, or infraclavicular lymphadenopathy noted on palpation.  Cardiovascular: Regular rate  and rhythm.Marland Kitchen Respiratory: Clear to auscultation bilaterally. Chest expansion symmetric; breathing non-labored.  Breasts: left breast s/p lumpectomy, no nodules or masses, mild amt of scar tissue present right breast without nodules, masses, skin or nipple changes GI: Abdomen soft and round; non-tender, non-distended. Bowel sounds normoactive.  GU: Deferred.  Neuro: No focal deficits. Steady gait.  Psych:  Mood and affect normal and appropriate for situation.  Extremities: No edema. MSK: No focal spinal tenderness to palpation.  Full range of motion in bilateral upper extremities Skin: Warm and dry.  LABORATORY DATA:  None for this visit.  DIAGNOSTIC IMAGING:  None for this visit.      ASSESSMENT AND PLAN:  Ms.. Romero is a pleasant 57 y.o. female with Stage IA left breast invasive ductal carcinoma, ER+/PR+/HER2-, diagnosed in 03/2017, treated with lumpectomy, adjuvant radiation therapy, and anti-estrogen therapy with Tamoxifen beginning in 04/2017.  She presents to the Survivorship Clinic for our initial meeting and routine follow-up post-completion of treatment for breast cancer.    1. Stage IA left breast cancer:  Courtney Romero is continuing to recover from definitive treatment for breast cancer. She will follow-up with her medical oncologist, Dr. Jana Hakim in 01/2018 with history and physical exam per surveillance protocol.  She is following up with Dr. Brantley Stage later this week and will determine whether or not to have her mammogram in a few weeks or in December.  If she delays it until December, I recommended postponing her f/u with Dr. Jana Hakim until that time as well.  She will continue her anti-estrogen therapy with Tamoxifen. Thus far, she is tolerating the Tamoxifen well, with minimal side effects.  Today, a comprehensive survivorship care plan and treatment summary was reviewed with the patient today detailing her breast cancer diagnosis, treatment course, potential late/long-term  effects of treatment, appropriate follow-up care with recommendations for the future, and patient education resources.  A copy of this summary, along with a letter will be sent to the patient's primary care provider via mail/fax/In Basket message after today's visit.    2. Bone health:  Given Courtney Romero's age/history of breast cancer, she is at risk for bone demineralization. It has been some time since she has undergone bone density testing.  She is going to f/u with Dr. Brantley Stage next week and she may want me to order bone density testing at her next mammogram whether that is in October, or if they decide to delay it until December.  She was given education on specific activities to promote bone health.  3. Cancer screening:  Due to Courtney Romero's history and her age, she should receive screening for skin cancers, colon cancer, and gynecologic cancers.  The information and recommendations are listed on the patient's comprehensive care plan/treatment summary and were reviewed in detail with the patient.    4. Health maintenance and wellness promotion: Courtney Romero was encouraged to consume 5-7 servings of fruits and vegetables per day. We reviewed the "Nutrition Rainbow" handout, as well as the handout "Take Control of Your Health and Reduce Your Cancer Risk" from the [place].  She was also encouraged to engage in moderate to vigorous exercise for 30 minutes per day most days of the week. We discussed the [employer] fitness program, which is designed for cancer survivors to help them become more physically fit after cancer treatments.  She was instructed to limit her alcohol consumption and continue to abstain from tobacco use.     5. Support services/counseling: It is not uncommon for this period of the patient's cancer care trajectory to be one of many emotions and stressors.  We discussed an opportunity for her to participate in the next session of [facility] ("Finding Your New Normal")  support group series designed for patients after they have completed treatment.   Ms. Mones was encouraged to take advantage of our many  other support services programs, support groups, and/or counseling in coping with her new life as a cancer survivor after completing anti-cancer treatment.  She was offered support today through active listening and expressive supportive counseling.  She was given information regarding our available services and encouraged to contact me with any questions or for help enrolling in any of our support group/programs.    Dispo:   -Return to cancer center tentatively in 01/2018 for f/u with Dr. Jana Hakim  -Mammogram due in 01/2018 -Follow up with surgery 12/2017 -She is welcome to return back to the Survivorship Clinic at any time; no additional follow-up needed at this time.  -Consider referral back to survivorship as a long-term survivor for continued surveillance  A total of (30) minutes of face-to-face time was spent with this patient with greater than 50% of that time in counseling and care-coordination.   Gardenia Phlegm, [place] [phone]   Note: PRIMARY CARE PROVIDER [name], [place] [phone]

## 2018-01-03 ENCOUNTER — Encounter: Payer: Self-pay | Admitting: Adult Health

## 2018-01-03 ENCOUNTER — Other Ambulatory Visit: Payer: Self-pay | Admitting: Adult Health

## 2018-01-03 ENCOUNTER — Telehealth: Payer: Self-pay | Admitting: Hematology

## 2018-01-03 ENCOUNTER — Telehealth: Payer: Self-pay

## 2018-01-03 DIAGNOSIS — Z170 Estrogen receptor positive status [ER+]: Secondary | ICD-10-CM

## 2018-01-03 DIAGNOSIS — C50212 Malignant neoplasm of upper-inner quadrant of left female breast: Secondary | ICD-10-CM

## 2018-01-03 DIAGNOSIS — C50412 Malignant neoplasm of upper-outer quadrant of left female breast: Secondary | ICD-10-CM

## 2018-01-03 NOTE — Telephone Encounter (Signed)
No los 9/16

## 2018-01-04 ENCOUNTER — Encounter: Payer: Self-pay | Admitting: Gastroenterology

## 2018-01-04 ENCOUNTER — Ambulatory Visit (INDEPENDENT_AMBULATORY_CARE_PROVIDER_SITE_OTHER): Admitting: Gastroenterology

## 2018-01-04 ENCOUNTER — Ambulatory Visit: Admitting: Gastroenterology

## 2018-01-04 VITALS — BP 124/83 | HR 76 | Ht 67.0 in | Wt 151.8 lb

## 2018-01-04 DIAGNOSIS — F458 Other somatoform disorders: Secondary | ICD-10-CM | POA: Diagnosis not present

## 2018-01-04 DIAGNOSIS — R198 Other specified symptoms and signs involving the digestive system and abdomen: Secondary | ICD-10-CM

## 2018-01-04 DIAGNOSIS — R0989 Other specified symptoms and signs involving the circulatory and respiratory systems: Secondary | ICD-10-CM

## 2018-01-04 NOTE — Progress Notes (Signed)
Courtney Bellows MD, MRCP(U.K) [address]  [place]  [place], [place] [address]  Main: [phone]  Fax: [phone]   Primary Care Physician: [name], MD  Primary Gastroenterologist:  Dr. Jonathon Romero   Chief Complaint  Patient presents with  . Follow-up    Globus sensation    HPI: Courtney Romero is a 57 y.o. female    Summary of history :  She was initially seen on 09/2017 for a colonoscopy and hemorroids. She says she had her hemorrhoids banded a few years back. She says she has been seen by a colorectal surgeon at [facility] for the same.She says that she was seen by an ENT 2 years for swallowing issues- feels like something in her throat all day long . She says she was told she had an inflamed larynx. Did not help. Some heartburn. No issues with swallowing. She is a Theme park manager. Always feels it. Always clears her throat. Some post nasal drip. Has tried flonase and zyrtec which did not help. She has had colon polyps in the past .   Interval history   10/12/2017-  01/04/2018  12/12/17: EGD- normal esophagus , no EOE on biopsies, fundic gland polyps. Colonoscopy sigmoid colon serrated adenoma excised.   Hemorrhoids : seems to be doing ok , not keen on any further procedures. Still feels she needs to clear her throat , food goes down fine. On omeprazole40 mg .   Current Outpatient Medications  Medication Sig Dispense Refill  . cholecalciferol (VITAMIN D) 1000 units tablet Take 1,000 Units by mouth daily.    Marland Kitchen LORazepam (ATIVAN) 0.5 MG tablet Take 1 mg by mouth as needed.     . Melatonin 5 MG TABS Take 1 tablet by mouth.    Marland Kitchen omeprazole (PRILOSEC) 40 MG capsule Take 40 mg by mouth daily.    . tamoxifen (NOLVADEX) 20 MG tablet Take 1 tablet (20 mg total) by mouth daily. 90 tablet 4  . zolpidem (AMBIEN) 5 MG tablet Take by mouth as needed.      No current facility-administered medications for this visit.     Allergies as of 01/04/2018 - Review Complete  01/04/2018  Allergen Reaction Noted  . Propoxyphene Rash 02/06/2015  . Thioridazine hcl    . Bupropion Rash and Other (See Comments) 04/05/2012  . Citalopram Rash and Other (See Comments) 12/13/2013  . Duloxetine Rash and Other (See Comments) 04/05/2012  . Thioridazine Rash 02/24/2015    ROS:  General: Negative for anorexia, weight loss, fever, chills, fatigue, weakness. ENT: Negative for hoarseness, difficulty swallowing , nasal congestion. CV: Negative for chest pain, angina, palpitations, dyspnea on exertion, peripheral edema.  Respiratory: Negative for dyspnea at rest, dyspnea on exertion, cough, sputum, wheezing.  GI: See history of present illness. GU:  Negative for dysuria, hematuria, urinary incontinence, urinary frequency, nocturnal urination.  Endo: Negative for unusual weight change.    Physical Examination:   BP 124/83   Pulse 76   Ht 5\' 7"  (1.702 m)   Wt 151 lb 12.8 oz (68.9 kg)   BMI 23.78 kg/m   General: Well-nourished, well-developed in no acute distress.  Eyes: No icterus. Conjunctivae pink. Mouth: Oropharyngeal mucosa moist and pink , no lesions erythema or exudate. Lungs: Clear to auscultation bilaterally. Non-labored. Heart: Regular rate and rhythm, no murmurs rubs or gallops.  Abdomen: Bowel sounds are normal, nontender, nondistended, no hepatosplenomegaly or masses, no abdominal bruits or hernia , no rebound or guarding.   Extremities: No lower  extremity edema. No clubbing or deformities. Neuro: Alert and oriented x 3.  Grossly intact. Skin: Warm and dry, no jaundice.   Psych: Alert and cooperative, normal mood and affect.   Imaging Studies: No results found.  Assessment and Plan:   Courtney Romero is a 57 y.o. y/o female here to follow up for hemorrhoids and  globus sensation,possible post nasal drip . Her main issue today is still sensation to clear her throat. Explained will double dose her PPI , she may have an element of GERD but does not  completely explain all her symptoms as she has the same sensation even when uprightand on 40 mg PPI. I will refer her back to ENT to evaluate and if no improvement on BID PPI and negative ENT evaluation, the next step would be to try a course of Elavil to treat any functional issues.    Dr Courtney Bellows  MD,MRCP [facility]) Follow up in 8 weeks

## 2018-01-26 ENCOUNTER — Ambulatory Visit: Attending: Surgery | Admitting: Rehabilitation

## 2018-01-26 ENCOUNTER — Other Ambulatory Visit: Payer: Self-pay

## 2018-01-26 ENCOUNTER — Encounter: Payer: Self-pay | Admitting: Rehabilitation

## 2018-01-26 DIAGNOSIS — R29898 Other symptoms and signs involving the musculoskeletal system: Secondary | ICD-10-CM | POA: Diagnosis present

## 2018-01-26 DIAGNOSIS — M25612 Stiffness of left shoulder, not elsewhere classified: Secondary | ICD-10-CM | POA: Diagnosis not present

## 2018-01-26 DIAGNOSIS — Z483 Aftercare following surgery for neoplasm: Secondary | ICD-10-CM

## 2018-01-26 NOTE — Patient Instructions (Signed)
Post op stretches handout  Hold off on resistance until range improves

## 2018-01-26 NOTE — Therapy (Signed)
[place], [place], [address] Phone: [phone]   Fax:  [phone]  Physical Therapy Evaluation  Patient Details  Name: Courtney Romero MRN: [id number] Date of Birth: Jun 12, 1960 Referring Provider (PT): Dr. Erroll Luna   Encounter Date: 01/26/2018  PT End of Session - 01/26/18 2049    Visit Number  1    Number of Visits  8    Date for PT Re-Evaluation  02/23/18    PT Start Time  1430    PT Stop Time  1515    PT Time Calculation (min)  45 min    Activity Tolerance  Patient tolerated treatment well    Behavior During Therapy  [facility] for tasks assessed/performed       Past Medical History:  Diagnosis Date  . Abnormal CAT scan 2013  . Anxiety   . Cancer ([place])   . Family history of breast cancer   . Family history of pancreatic cancer   . Family history of prostate cancer   . GERD (gastroesophageal reflux disease)   . Hepatitis C 2001   Followed by Dr. Charlean Sanfilippo at [facility]  . History of radiation therapy 07/26/17- 08/22/17   40.05 Gy directed to the left breast in 15 fractions followed by a boost of 10 Gy in 5 fractions.   . Personal history of radiation therapy   . PONV (postoperative nausea and vomiting)     Past Surgical History:  Procedure Laterality Date  . ABDOMINAL HYSTERECTOMY  2009  . breast cyst removal  1995  . BREAST LUMPECTOMY    . BREAST LUMPECTOMY WITH RADIOACTIVE SEED AND SENTINEL LYMPH NODE BIOPSY Left 06/14/2017   Procedure: LEFT BREAST SEED LOCALIZED LUMPECTOMY (2 SEEDS) AND SENTINEL LYMPH NODE BIOPSY;  Surgeon: Erroll Luna, MD;  Location: [facility];  Service: General;  Laterality: Left;  . COLONOSCOPY  2011  . COLONOSCOPY WITH PROPOFOL N/A 12/12/2017   Procedure: COLONOSCOPY WITH PROPOFOL;  Surgeon: Jonathon Bellows, MD;  Location[facility] ENDOSCOPY;  Service: Gastroenterology;  Laterality: N/A;  . ESOPHAGOGASTRODUODENOSCOPY (EGD) WITH PROPOFOL N/A 12/12/2017   Procedure: ESOPHAGOGASTRODUODENOSCOPY (EGD) WITH PROPOFOL;  Surgeon: Jonathon Bellows, MD;  Location: [facility] ENDOSCOPY;  Service: Gastroenterology;  Laterality: N/A;  . FLEXIBLE SIGMOIDOSCOPY  February 12, 2013   have normal perianal exam, question focal prolapse. 2 small scars in the rectum.  Marland Kitchen HEMORRHOID BANDING  2012   3 times over three months  . SIGMOIDOSCOPY  2013  . TONSILECTOMY/ADENOIDECTOMY WITH MYRINGOTOMY  remote  . UPPER GI ENDOSCOPY  2013    There were no vitals filed for this visit.   Subjective Assessment - 01/26/18 1435    Subjective  My cording is back and I am having issues since my seroma was drained x 4     Pertinent History  Left breast cancer diagnosed 04/14/17 and had lumpectomy 06/14/17 with SLNB (3 nodes). Radiation was completed 08/22/17.  Has been on tamoxifen since January. Otherwise healthy. Did have a right shoulder impingement a year ago or so and says that is a lot better than it was..  Seroma came up in the superior breast and was drained 4 times.     Limitations  Lifting;Other (comment)   gym activities   Patient Stated Goals  improve the status of the Lt chest pull and arm movments    Currently in Pain?  No/denies   only pain when lifting overhead up to 4/10   Pain Location  Chest  Pain Orientation  Left    Pain Descriptors / Indicators  Aching    Pain Frequency  Intermittent    Aggravating Factors   lifting all the way up to the ceiling         [facility] PT Assessment - 01/26/18 0001      Assessment   Medical Diagnosis  left breast cancer (DCIS) s/p lumpectomy     Referring Provider (PT)  Dr. Marcello Moores Cornett    Onset Date/Surgical Date  06/14/17    Hand Dominance  Right    Prior Therapy  none      Precautions   Precautions  Other (comment)    Precaution Comments  cancer precautions      Restrictions   Weight Bearing Restrictions  No      Balance Screen   Has the patient fallen in the past 6 months  No    Has the patient had a decrease in activity  level because of a fear of falling?   No    Is the patient reluctant to leave their home because of a fear of falling?   No      Home Film/video editor residence    Living Arrangements  Spouse/significant other    Type of [place]  One level      Prior Function   Level of Independence  Independent    Vocation  Full time employment    Vocation Requirements  hairdresser    Leisure  walking 3-4 days per week, trying to do weight classes but having a hard time with the pain      Cognition   Overall Cognitive Status  Within Functional Limits for tasks assessed      Observation/Other Assessments   Observations  visible cording at left axilla with left shoulder abduction to the antecubital fossa    Skin Integrity  two incisions: upper breast and axilla, each about two inches long and healing well, though with very small scabs present.      Coordination   Gross Motor Movements are Fluid and Coordinated  Yes      AROM   Right/Left Shoulder  Right;Left    Right Shoulder Flexion  --   cording pulling   Right Shoulder Horizontal ABduction  40 Degrees    Left Shoulder Flexion  160 Degrees   pulling from cording   Left Shoulder ABduction  180 Degrees   pull into the breast    Left Shoulder Internal Rotation  --   behind the back WNL no pull    Left Shoulder External Rotation  --   behind the head wiht pull into the chest    Left Shoulder Horizontal ABduction  30 Degrees      PROM   Left Shoulder Flexion  170 Degrees    Left Shoulder ABduction  160 Degrees      Palpation   Palpation comment  hardened tissue / possibly scar tissue around seroma anterior breast              Quick Dash - 01/26/18 0001    Open a tight or new jar  No difficulty    Do heavy household chores (wash walls, wash floors)  No difficulty    Carry a shopping bag or briefcase  No difficulty    Wash your back  No difficulty    Use a knife to cut food  No  difficulty  Recreational activities in which you take some force or impact through your arm, shoulder, or hand (golf, hammering, tennis)  Moderate difficulty    During the past week, to what extent has your arm, shoulder or hand problem interfered with your normal social activities with family, friends, neighbors, or groups?  Not at all    During the past week, to what extent has your arm, shoulder or hand problem limited your work or other regular daily activities  Slightly    Arm, shoulder, or hand pain.  Mild    Tingling (pins and needles) in your arm, shoulder, or hand  None    Difficulty Sleeping  No difficulty    DASH Score  9.09 %        Objective measurements completed on examination: See above findings.      [facility] Adult PT Treatment/Exercise - 01/26/18 0001      Exercises   Other Exercises   reviewed post op stretches and educated patient to hold off on resistance and focus on stretches until meeting with OT             PT Education - 01/26/18 2049    Education provided  Yes    Education Details  POC    Person(s) Educated  Patient    Methods  Explanation    Comprehension  Verbalized understanding          PT Long Term Goals - 01/26/18 2058      PT LONG TERM GOAL #1   Title  Pt. will report at least 50% decrease in discomfort and greater ease in reaching left arm forward and up.    Time  4    Period  Weeks    Status  New    Target Date  02/23/18      PT LONG TERM GOAL #2   Title  Pt. will be independent with home exercise program for shoulder ROM    Time  4    Period  Weeks    Status  New    Target Date  02/23/18      PT LONG TERM GOAL #3   Title  Pt will peform shoulder AROM WNL without increased pulling into the Lt chest wall     Time  4    Period  Weeks    Status  New    Target Date  02/23/18      PT LONG TERM GOAL #4   Title  Pt will return to gym activities without limitations     Time  4    Period  Weeks    Status  New    Target  Date  02/23/18             Plan - 01/26/18 2051    Clinical Impression Statement  [place] returns today after completion of radiation in May with return of cording in the Lt UE from the axilla to the antecubital fossa and now with pulling into the chest after a new seroma that has been drained x 4.  The shoulder overall is not significantly limited but with pectoralis pulling with abduction and horizontal abduction.  Some hardening in the breast tissue most likely at the seroma location/scar tissue.  Pt would like to have therapy in [facility] this time due to return to work in this area.      Clinical Presentation  Evolving    Clinical Presentation due to:  return of cording and seroma  Clinical Decision Making  Moderate    Rehab Potential  Excellent    PT Frequency  2x / week    PT Duration  4 weeks    PT Treatment/Interventions  ADLs/Self Care Home Management;Moist Heat;Therapeutic exercise;Patient/family education;Manual techniques;Manual lymph drainage;Compression bandaging;Scar mobilization;Passive range of motion    PT Next Visit Plan  pt should be transferring to [facility] but if returns working on Lt UE cording and chest wall restrictions    PT Home Exercise Plan  post op stretch handout    Consulted and Agree with Plan of Care  Patient       Patient will benefit from skilled therapeutic intervention in order to improve the following deficits and impairments:  Decreased range of motion, Increased fascial restricitons, Pain, Impaired UE functional use  Visit Diagnosis: Stiffness of left shoulder, not elsewhere classified  Other symptoms and signs involving the musculoskeletal system  Aftercare following surgery for neoplasm     Problem List Patient Active Problem List   Diagnosis Date Noted  . Genetic testing 07/28/2017  . GERD without esophagitis 07/05/2017  . Family history of pancreatic cancer   . Family history of prostate cancer   . Family  history of breast cancer   . Aortic atherosclerosis ([place]) 04/28/2017  . Malignant neoplasm of upper-inner quadrant of left breast in female, estrogen receptor positive ([place]) 04/27/2017  . Hepatitis C, chronic ([place]) 09/21/2016  . Health care maintenance 07/29/2015  . Menopausal disorder 01/20/2014  . 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 Rectal pain 05/31/2013  . Hemorrhoids 05/31/2013  . Rectal fissure 11/20/2012  . 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 Bloating 10/03/2012  . Nausea 10/03/2012  . Anal pain 05/09/2012  . Anxiety and depression 04/04/2012  . Diverticulosis 03/21/2012  . HEPATITIS C 01/08/2009  . SHORTNESS OF BREATH 01/08/2009    Shan Levans, PT 01/26/2018, 9:01 PM  [place], [place], [address] Phone: [phone]   Fax:  [phone]  Name: Courtney Romero MRN: [id number] Date of Birth: [birth year]

## 2018-02-02 ENCOUNTER — Other Ambulatory Visit: Payer: Self-pay

## 2018-02-02 ENCOUNTER — Encounter: Payer: Self-pay | Admitting: Occupational Therapy

## 2018-02-02 ENCOUNTER — Ambulatory Visit: Attending: Surgery | Admitting: Occupational Therapy

## 2018-02-02 DIAGNOSIS — Z483 Aftercare following surgery for neoplasm: Secondary | ICD-10-CM | POA: Insufficient documentation

## 2018-02-02 DIAGNOSIS — R29898 Other symptoms and signs involving the musculoskeletal system: Secondary | ICD-10-CM | POA: Diagnosis present

## 2018-02-02 DIAGNOSIS — L905 Scar conditions and fibrosis of skin: Secondary | ICD-10-CM | POA: Diagnosis present

## 2018-02-02 DIAGNOSIS — M25612 Stiffness of left shoulder, not elsewhere classified: Secondary | ICD-10-CM | POA: Insufficient documentation

## 2018-02-02 NOTE — Therapy (Signed)
[facility] [address] [place], [address] Phone: [phone]   Fax:  [phone]  Occupational Therapy Evaluation  Patient Details  Name: Courtney Romero MRN: [id number] Date of Birth: March 07, 1961 No data recorded  Encounter Date: 02/02/2018  OT End of Session - 02/02/18 2045    Visit Number  1    Number of Visits  8    Date for OT Re-Evaluation  03/30/18    OT Start Time  1410    OT Stop Time  1500    OT Time Calculation (min)  50 min    Activity Tolerance  Patient tolerated treatment well    Behavior During Therapy  [facility] for tasks assessed/performed       Past Medical History:  Diagnosis Date  . Abnormal CAT scan 2013  . Anxiety   . Cancer ([place])   . Family history of breast cancer   . Family history of pancreatic cancer   . Family history of prostate cancer   . GERD (gastroesophageal reflux disease)   . Hepatitis C 2001   Followed by Dr. Charlean Sanfilippo at [facility]  . History of radiation therapy 07/26/17- 08/22/17   40.05 Gy directed to the left breast in 15 fractions followed by a boost of 10 Gy in 5 fractions.   . Personal history of radiation therapy   . PONV (postoperative nausea and vomiting)     Past Surgical History:  Procedure Laterality Date  . ABDOMINAL HYSTERECTOMY  2009  . breast cyst removal  1995  . BREAST LUMPECTOMY    . BREAST LUMPECTOMY WITH RADIOACTIVE SEED AND SENTINEL LYMPH NODE BIOPSY Left 06/14/2017   Procedure: LEFT BREAST SEED LOCALIZED LUMPECTOMY (2 SEEDS) AND SENTINEL LYMPH NODE BIOPSY;  Surgeon: Erroll Luna, MD;  Location: [facility];  Service: General;  Laterality: Left;  . COLONOSCOPY  2011  . COLONOSCOPY WITH PROPOFOL N/A 12/12/2017   Procedure: COLONOSCOPY WITH PROPOFOL;  Surgeon: Jonathon Bellows, MD;  Location: [facility] ENDOSCOPY;  Service: Gastroenterology;  Laterality: N/A;  . ESOPHAGOGASTRODUODENOSCOPY (EGD) WITH PROPOFOL N/A 12/12/2017   Procedure:  ESOPHAGOGASTRODUODENOSCOPY (EGD) WITH PROPOFOL;  Surgeon: Jonathon Bellows, MD;  Location: [facility] ENDOSCOPY;  Service: Gastroenterology;  Laterality: N/A;  . FLEXIBLE SIGMOIDOSCOPY  February 12, 2013   have normal perianal exam, question focal prolapse. 2 small scars in the rectum.  Marland Kitchen HEMORRHOID BANDING  2012   3 times over three months  . SIGMOIDOSCOPY  2013  . TONSILECTOMY/ADENOIDECTOMY WITH MYRINGOTOMY  remote  . UPPER GI ENDOSCOPY  2013    There were no vitals filed for this visit.  Subjective Assessment - 02/02/18 2035    Subjective   I was doing great and then this seroma come up on my L breast and they had to drain it x 4 and now breast tight , painfull with range of motion and some cording in armpit again     Pertinent History  Left breast cancer diagnosed 04/14/17 and had lumpectomy 06/14/17 with SLNB (3 nodes). Radiation was completed 08/22/17.  Has been on tamoxifen since January. Otherwise healthy. Did have a right shoulder impingement a year ago or so and says that is a lot better than it was..  Seroma came up in the superior breast and was drained 4 times .     Patient Stated Goals  Want to get my L breast better , the tightness/pain  and range of motion so I can workout in gym ,  lift objects and pull shirt over my head     Currently in Pain?  Yes    Pain Score  7     Pain Location  Breast    Pain Orientation  Left    Pain Descriptors / Indicators  Aching;Tightness;Tender    Pain Type  Surgical pain    Pain Onset  More than a month ago    Pain Frequency  Intermittent    Aggravating Factors   lifting over head, external rotation         [facility] OT Assessment - 02/02/18 0001      Assessment   Medical Diagnosis  left breast cancer (DCIS) s/p lumpectomy     Onset Date/Surgical Date  06/14/17    Hand Dominance  Right    Prior Therapy  --   PT in May      Precautions   Precaution Comments  cancer precautions      Restrictions   Weight Bearing Restrictions  No      Prior Function    Vocation  Full time employment    Leisure  hair dresser , play golf and gyn - but cannot do weight and weight bearing exercises       Observation/Other Assessments   Skin Integrity  two incisions: upper breast and axilla, each about two inches long and healing well,       Palpation   Palpation comment  hardened tissue / possibly scar tissue around seroma anterior breast       AROM   Left Shoulder Flexion  160 Degrees   cording in axilla and into upper arm    Left Shoulder ABduction  155 Degrees   cording in axilla into upper arm    Left Shoulder Internal Rotation  --   [facility]   Left Shoulder External Rotation  --   behind head with pull over chest - 7/10          Can do some soft manual circles,criss cross and along scar - and soft tissue on superior L breast - 1 x day by husband - 2-3 min  AAROM on wall for shoulder flexion and ABD  External rotation on corner  In supine hands behind head -  10 reps  slight pull or stretch - less than 1-2/10 2 x day   Golf club stretch in supine gentle into scaption over head for L  10 reps  slight pull or stretch  2 x day  Scapula squeezes  5 x day               OT Education - 02/02/18 2045    Education Details  findings and HEP     Person(s) Educated  Patient    Methods  Explanation;Demonstration;Handout    Comprehension  Returned demonstration;Verbalized understanding       OT Short Term Goals - 02/02/18 2053      OT SHORT TERM GOAL #1   Title   Pt. will be independent with home exercise program for shoulder ROM and decrease discomfort and pull into chest less than 3/10     Baseline  discomfort or pain increase to 7/10 with external rotation and ABD /horizontal abd     Time  3    Period  Weeks    Status  New    Target Date  02/23/18        OT Long Term Goals - 02/02/18 2055      OT LONG TERM GOAL #1  Title  Pt. will report at least 50% decrease in discomfort and greater ease in reaching left arm forward,  up and out     Baseline  70% discomfort into chest wall and axiall where cording it     Time  4    Period  Weeks    Status  New    Target Date  03/02/18      OT LONG TERM GOAL #2   Title  Pt will return to gym activities without limitations  for planks and weights     Baseline  cannot do weight bearing or weights     Time  8    Period  Weeks    Status  New    Target Date  03/30/18            Plan - 02/02/18 2048    Clinical Impression Statement  visible cording at left axilla with left shoulder abduction to the antecubital fossa        Patient will benefit from skilled therapeutic intervention in order to improve the following deficits and impairments:  Pain, Impaired flexibility, Decreased scar mobility, Impaired UE functional use, Decreased strength, Decreased range of motion  Visit Diagnosis: Stiffness of left shoulder, not elsewhere classified - Plan: Ot plan of care cert/re-cert  Aftercare following surgery for neoplasm - Plan: Ot plan of care cert/re-cert  Other symptoms and signs involving the musculoskeletal system - Plan: Ot plan of care cert/re-cert  Scar condition and fibrosis of skin - Plan: Ot plan of care cert/re-cert    Problem List Patient Active Problem List   Diagnosis Date Noted  . Genetic testing 07/28/2017  . GERD without esophagitis 07/05/2017  . Family history of pancreatic cancer   . Family history of prostate cancer   . Family history of breast cancer   . Aortic atherosclerosis ([place]) 04/28/2017  . Malignant neoplasm of upper-inner quadrant of left breast in female, estrogen receptor positive ([place]) 04/27/2017  . Hepatitis C, chronic ([place]) 09/21/2016  . Health care maintenance 07/29/2015  . Menopausal disorder 01/20/2014  . 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 Rectal pain 05/31/2013  . Hemorrhoids 05/31/2013  . Rectal fissure 11/20/2012  . 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 Bloating 10/03/2012  . Nausea 10/03/2012  . Anal  pain 05/09/2012  . Anxiety and depression 04/04/2012  . Diverticulosis 03/21/2012  . HEPATITIS C 01/08/2009  . SHORTNESS OF BREATH 01/08/2009    Rosalyn Gess OTR/L,CLT 02/02/2018, 9:01 PM  [facility] [address] [place] [address] [place], [address] Phone: [phone]   Fax:  [phone]  Name: KHALAYA MCGURN MRN: [id number] Date of Birth: 1961/01/24

## 2018-02-02 NOTE — Patient Instructions (Signed)
Can do some soft manual circles,criss cross and along scar - and soft tissue on superior L breast - 1 x day by husband - 2-3 min  AAROM on wall for shoulder flexion and ABD  External rotation on corner  In supine hands behind head -  10 reps  slight pull or stretch - less than 1-2/10 2 x day   Golf club stretch in supine gentle into scaption over head for L  10 reps  slight pull or stretch  2 x day  Scapula squeezes  5 x day

## 2018-02-07 ENCOUNTER — Encounter: Payer: Self-pay | Admitting: Internal Medicine

## 2018-02-07 ENCOUNTER — Ambulatory Visit (INDEPENDENT_AMBULATORY_CARE_PROVIDER_SITE_OTHER): Admitting: Internal Medicine

## 2018-02-07 VITALS — BP 126/70 | HR 89 | Ht 67.0 in | Wt 155.0 lb

## 2018-02-07 DIAGNOSIS — R0602 Shortness of breath: Secondary | ICD-10-CM | POA: Diagnosis not present

## 2018-02-07 DIAGNOSIS — R911 Solitary pulmonary nodule: Secondary | ICD-10-CM

## 2018-02-07 NOTE — Progress Notes (Signed)
[facility] Consultation      Date: 02/07/2018,   MRN# [id number] Courtney Romero [birth year]     AdmissionWeight: 155 lb (70.3 kg)                 CurrentWeight: 155 lb (70.3 kg) Courtney Romero is a 57 y.o. old female seen in consultation for cough at the request of Dr. Virgia Land.  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  Patient also with h/o 2 R lung nodules subcentimeter RML,RLL stable in size First scan in 03/2015 and second scan in 06/2015 shows no change in nodule size and no acute findings Third CT scan 02/2016 shows stable lung nodules since 03/2015   Office SPiro shows NORMAL SPIRO Ratio 84% Fev1 3.1 L 104% FVC 3.7 97%   CHIEF COMPLAINT:   Follow-up abnormal CT chest   HISTORY OF PRESENT ILLNESS   Since last office visit patient has been diagnosed with breast cancer status post lumpectomy in February 2019 Patient underwent radiation therapy 20 treatments in May 2019 Patient subsequently developed a seroma in her left breast dealing with scar tissue at this time  Prior to that patient was seen for bilateral lung nodules but never followed up with me she needed a CT chest She has no acute respiratory issues at this time however she has progressive shortness of breath over the last several months  She notices more with exertion with playing golf She denies fevers She denies coughing or wheezing episodes   No nausea vomiting diarrhea No signs of infection at this time  No signs of heart failure at this time No lower extremity swelling  Patient also had a death in the family her mother passed away She is dealing with a lot of stress at this time  Current Medication:  Current Outpatient Medications:  .  cholecalciferol (VITAMIN D) 1000 units tablet, Take  1,000 Units by mouth daily., Disp: , Rfl:  .  LORazepam (ATIVAN) 0.5 MG tablet, Take 1 mg by mouth as needed. , Disp: , Rfl:  .  Melatonin 5 MG TABS, Take 1 tablet by mouth., Disp: , Rfl:  .  omeprazole (PRILOSEC) 40 MG capsule, Take 40 mg by mouth daily., Disp: , Rfl:  .  tamoxifen (NOLVADEX) 20 MG tablet, Take 1 tablet (20 mg total) by mouth daily., Disp: 90 tablet, Rfl: 4 .  zolpidem (AMBIEN) 5 MG tablet, Take by mouth as needed. , Disp: , Rfl:     ALLERGIES   Propoxyphene; Thioridazine hcl; Bupropion; Citalopram; Duloxetine; and Thioridazine     REVIEW OF SYSTEMS   Review of Systems  Constitutional: Negative for chills, diaphoresis, fever, malaise/fatigue and weight loss.  HENT: Negative for congestion.   Respiratory: Positive for shortness of breath. Negative for cough, hemoptysis, sputum production and wheezing.   Cardiovascular: Negative for chest pain, palpitations, orthopnea and leg swelling.  Gastrointestinal: Negative for heartburn.  Neurological: Negative for weakness.  All other systems reviewed and are negative.    BP 126/70 (BP Location: Right Leg, Cuff Size: Normal)   Pulse 89   Ht 5\' 7"  (1.702 m)   Wt 155 lb (70.3 kg)   SpO2 99%   BMI 24.28 kg/m     PHYSICAL EXAM  Physical Exam  Constitutional: She is oriented to  person, place, and time. No distress.  HENT:  Mouth/Throat: No oropharyngeal exudate.  Neck: Neck supple.  Cardiovascular: Normal rate, regular rhythm and normal heart sounds.  No murmur heard. Pulmonary/Chest: Effort normal and breath sounds normal. No stridor. No respiratory distress. She has no wheezes.  Musculoskeletal: Normal range of motion. She exhibits no edema.  Neurological: She is alert and oriented to person, place, and time. No cranial nerve deficit.  Skin: Skin is warm. She is not diaphoretic.  Psychiatric: She has a normal mood and affect.         IMAGING   CT Chest 02/2016 Pulmonary nodules measure 7 mm or less in  size and are stable from 03/28/2015.  Images reveiwed  ASSESSMENT/PLAN   57 year old pleasant white female seen today for progressive shortness of breath over the last several months in the setting of history of breast cancer status post radiation therapy also with a history of bilateral lung nodules associated with chronic allergic rhinitis    #1 shortness of breath and dyspnea on exertion Etiology is uncertain at this time she will need further work-up with CT of the chest to assess for radiation pneumonitis and also needs pulmonary function testing to assess for her shortness of breath We will not start any therapy at this time  #2 CT chest with lung nodules Recommend CT chest for interval assessment over the past year and a half   #3 allergic rhinitis Continue meds as prescribed  #4 GERD Continue PPI    Patient satisfied with Plan of action and management. All questions answered Once tests completed will update patient   Alexus Galka Patricia Pesa, M.D.  [facility]  Medical Director [place] Director [facility] Cardio-Pulmonary Department

## 2018-02-07 NOTE — Patient Instructions (Signed)
Obtain CT chest and obtain PFT's

## 2018-02-09 ENCOUNTER — Ambulatory Visit: Admitting: Occupational Therapy

## 2018-02-09 DIAGNOSIS — R29898 Other symptoms and signs involving the musculoskeletal system: Secondary | ICD-10-CM

## 2018-02-09 DIAGNOSIS — Z483 Aftercare following surgery for neoplasm: Secondary | ICD-10-CM

## 2018-02-09 DIAGNOSIS — L905 Scar conditions and fibrosis of skin: Secondary | ICD-10-CM

## 2018-02-09 DIAGNOSIS — M25612 Stiffness of left shoulder, not elsewhere classified: Secondary | ICD-10-CM | POA: Diagnosis not present

## 2018-02-09 NOTE — Patient Instructions (Signed)
Add kinetiotape to L breast - with achor behind L clavicle -and 3 fingers down to superior breast - and scar   pt can replace it  3 x until next week - but ed on precautions -and need during daytime  Leave of at night time  Cont with same stretches and massage

## 2018-02-09 NOTE — Therapy (Signed)
[facility] [address] [place], [address] Phone: [phone]   Fax:  [phone]  Occupational Therapy Treatment  Patient Details  Name: Courtney Romero MRN: [id number] Date of Birth: 1960/07/28 No data recorded  Encounter Date: 02/09/2018  OT End of Session - 02/09/18 0958    Visit Number  2    Number of Visits  8    Date for OT Re-Evaluation  03/30/18    OT Start Time  0820    OT Stop Time  0854    OT Time Calculation (min)  34 min    Activity Tolerance  Patient tolerated treatment well    Behavior During Therapy  [facility] for tasks assessed/performed       Past Medical History:  Diagnosis Date  . Abnormal CAT scan 2013  . Anxiety   . Cancer ([place])   . Family history of breast cancer   . Family history of pancreatic cancer   . Family history of prostate cancer   . GERD (gastroesophageal reflux disease)   . Hepatitis C 2001   Followed by Dr. Charlean Sanfilippo at [facility]  . History of radiation therapy 07/26/17- 08/22/17   40.05 Gy directed to the left breast in 15 fractions followed by a boost of 10 Gy in 5 fractions.   . Personal history of radiation therapy   . PONV (postoperative nausea and vomiting)     Past Surgical History:  Procedure Laterality Date  . ABDOMINAL HYSTERECTOMY  2009  . breast cyst removal  1995  . BREAST LUMPECTOMY    . BREAST LUMPECTOMY WITH RADIOACTIVE SEED AND SENTINEL LYMPH NODE BIOPSY Left 06/14/2017   Procedure: LEFT BREAST SEED LOCALIZED LUMPECTOMY (2 SEEDS) AND SENTINEL LYMPH NODE BIOPSY;  Surgeon: Erroll Luna, MD;  Location: [place];  Service: General;  Laterality: Left;  . COLONOSCOPY  2011  . COLONOSCOPY WITH PROPOFOL N/A 12/12/2017   Procedure: COLONOSCOPY WITH PROPOFOL;  Surgeon: Jonathon Bellows, MD;  [facility];  Service: Gastroenterology;  Laterality: N/A;  . ESOPHAGOGASTRODUODENOSCOPY (EGD) WITH PROPOFOL N/A 12/12/2017   Procedure:  ESOPHAGOGASTRODUODENOSCOPY (EGD) WITH PROPOFOL;  Surgeon: Jonathon Bellows, MD;  Location: [facility] ENDOSCOPY;  Service: Gastroenterology;  Laterality: N/A;  . FLEXIBLE SIGMOIDOSCOPY  February 12, 2013   have normal perianal exam, question focal prolapse. 2 small scars in the rectum.  Marland Kitchen HEMORRHOID BANDING  2012   3 times over three months  . SIGMOIDOSCOPY  2013  . TONSILECTOMY/ADENOIDECTOMY WITH MYRINGOTOMY  remote  . UPPER GI ENDOSCOPY  2013    There were no vitals filed for this visit.  Subjective Assessment - 02/09/18 0953    Subjective   I did the stretches and massage -the cording in armpit feels better -and the stretch or pull still over my breast above the nipple     Patient Stated Goals  Want to get my L breast better , the tightness/pain  and range of motion so I can workout in gym , lift objects and pull shirt over my head     Currently in Pain?  Yes    Pain Score  3     Pain Location  Chest    Pain Orientation  Left    Pain Descriptors / Indicators  Aching;Tightness;Tender       Scapula mobs done in all directions -  And soft tissue mobs to axilla - myofascial release - and arm into flexion and external rotation - able to  tolerate better this date  Done some gentle scar mobs  And     soft manual circles,criss cross and along scar - and soft tissue on superior L breast -  Can cont to do at home 1 x day by husband or pt  - 2-3 min  AAROM on wall for shoulder flexion and ABD  External rotation on corner  Or doorway  In supine hands behind head -  10 reps  slight pull or stretch - less than 1-2/10 2 x day   Golf club stretch in supine gentle into scaption over head for L  10 reps  slight pull or stretch  2 x day  Scapula squeezes  5 x day   Done some xtractor - 5 x on scar - not issues     Add kinetiotape to L breast - with achor behind L clavicle -and 3 fingers down to superior breast - and scar   pt can replace it  3 x until next week - but ed on precautions -and need  during daytime  Leave of at night time                 OT Education - 02/09/18 0958    Education Details  HEP and add taping     Person(s) Educated  Patient    Methods  Explanation;Demonstration;Handout    Comprehension  Returned demonstration;Verbalized understanding       OT Short Term Goals - 02/02/18 2053      OT SHORT TERM GOAL #1   Title   Pt. will be independent with home exercise program for shoulder ROM and decrease discomfort and pull into chest less than 3/10     Baseline  discomfort or pain increase to 7/10 with external rotation and ABD /horizontal abd     Time  3    Period  Weeks    Status  New    Target Date  02/23/18        OT Long Term Goals - 02/02/18 2055      OT LONG TERM GOAL #1   Title  Pt. will report at least 50% decrease in discomfort and greater ease in reaching left arm forward, up and out     Baseline  70% discomfort into chest wall and axiall where cording it     Time  4    Period  Weeks    Status  New    Target Date  03/02/18      OT LONG TERM GOAL #2   Title  Pt will return to gym activities without limitations  for planks and weights     Baseline  cannot do weight bearing or weights     Time  8    Period  Weeks    Status  New    Target Date  03/30/18            Plan - 02/09/18 0959    Clinical Impression Statement  Pt visible cording better in L axilla - palpate thin and soft one - show increase shoulder external rotation and horizontal ABD with less of pull - still feel pull over superior L breast     Occupational performance deficits (Please refer to evaluation for details):  ADL's;IADL's;Play;Leisure    Rehab Potential  Good    OT Frequency  1x / week    OT Duration  8 weeks    OT Treatment/Interventions  Self-care/ADL training;Therapeutic exercise;Manual lymph drainage;Manual Therapy;Passive range of motion;Scar  mobilization;Patient/family education    Plan  assess progress with HEP and change as needed      Clinical Decision Making  Limited treatment options, no task modification necessary    OT Home Exercise Plan  see pt instruciton     Consulted and Agree with Plan of Care  Patient       Patient will benefit from skilled therapeutic intervention in order to improve the following deficits and impairments:  Pain, Impaired flexibility, Decreased scar mobility, Impaired UE functional use, Decreased strength, Decreased range of motion  Visit Diagnosis: Stiffness of left shoulder, not elsewhere classified  Aftercare following surgery for neoplasm  Other symptoms and signs involving the musculoskeletal system  Scar condition and fibrosis of skin    Problem List Patient Active Problem List   Diagnosis Date Noted  . Genetic testing 07/28/2017  . GERD without esophagitis 07/05/2017  . Family history of pancreatic cancer   . Family history of prostate cancer   . Family history of breast cancer   . Aortic atherosclerosis ([place]) 04/28/2017  . Malignant neoplasm of upper-inner quadrant of left breast in female, estrogen receptor positive ([place]) 04/27/2017  . Hepatitis C, chronic ([place]) 09/21/2016  . Health care maintenance 07/29/2015  . Menopausal disorder 01/20/2014  . 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 Rectal pain 05/31/2013  . Hemorrhoids 05/31/2013  . Rectal fissure 11/20/2012  . 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 Bloating 10/03/2012  . Nausea 10/03/2012  . Anal pain 05/09/2012  . Anxiety and depression 04/04/2012  . Diverticulosis 03/21/2012  . HEPATITIS C 01/08/2009  . SHORTNESS OF BREATH 01/08/2009    Rosalyn Gess OTR/L,CLT 02/09/2018, 10:01 AM  [facility] [address] [place], [address] Phone: [phone]   Fax:  [phone]  Name: ZAEDA MCFERRAN MRN: [id number] Date of Birth: [birth year]

## 2018-02-10 ENCOUNTER — Ambulatory Visit: Admitting: Occupational Therapy

## 2018-02-14 ENCOUNTER — Ambulatory Visit: Attending: Internal Medicine

## 2018-02-14 DIAGNOSIS — R0602 Shortness of breath: Secondary | ICD-10-CM | POA: Diagnosis not present

## 2018-02-15 ENCOUNTER — Ambulatory Visit
Admission: RE | Admit: 2018-02-15 | Discharge: 2018-02-15 | Disposition: A | Discharge status: Home / Self Care | Source: Ambulatory Visit | Attending: Internal Medicine | Admitting: Internal Medicine

## 2018-02-15 DIAGNOSIS — R911 Solitary pulmonary nodule: Secondary | ICD-10-CM

## 2018-02-15 DIAGNOSIS — I700 Atherosclerosis of aorta: Secondary | ICD-10-CM | POA: Diagnosis not present

## 2018-02-15 DIAGNOSIS — R918 Other nonspecific abnormal finding of lung field: Secondary | ICD-10-CM | POA: Insufficient documentation

## 2018-02-16 ENCOUNTER — Other Ambulatory Visit: Payer: Self-pay | Admitting: Internal Medicine

## 2018-02-16 ENCOUNTER — Telehealth: Payer: Self-pay

## 2018-02-16 ENCOUNTER — Telehealth: Payer: Self-pay | Admitting: Oncology

## 2018-02-16 ENCOUNTER — Inpatient Hospital Stay (HOSPITAL_BASED_OUTPATIENT_CLINIC_OR_DEPARTMENT_OTHER): Admitting: Oncology

## 2018-02-16 ENCOUNTER — Inpatient Hospital Stay: Attending: Oncology

## 2018-02-16 VITALS — BP 135/93 | HR 97 | Temp 98.3°F | Resp 18 | Ht 67.0 in | Wt 150.5 lb

## 2018-02-16 DIAGNOSIS — I700 Atherosclerosis of aorta: Secondary | ICD-10-CM

## 2018-02-16 DIAGNOSIS — D0512 Intraductal carcinoma in situ of left breast: Secondary | ICD-10-CM | POA: Diagnosis not present

## 2018-02-16 DIAGNOSIS — C50212 Malignant neoplasm of upper-inner quadrant of left female breast: Secondary | ICD-10-CM | POA: Diagnosis present

## 2018-02-16 DIAGNOSIS — B182 Chronic viral hepatitis C: Secondary | ICD-10-CM

## 2018-02-16 DIAGNOSIS — Z170 Estrogen receptor positive status [ER+]: Secondary | ICD-10-CM | POA: Insufficient documentation

## 2018-02-16 DIAGNOSIS — Z79810 Long term (current) use of selective estrogen receptor modulators (SERMs): Secondary | ICD-10-CM | POA: Diagnosis not present

## 2018-02-16 DIAGNOSIS — J181 Lobar pneumonia, unspecified organism: Secondary | ICD-10-CM

## 2018-02-16 DIAGNOSIS — C50412 Malignant neoplasm of upper-outer quadrant of left female breast: Secondary | ICD-10-CM

## 2018-02-16 LAB — CBC WITH DIFFERENTIAL/PLATELET
Abs Immature Granulocytes: 0 10*3/uL (ref 0.00–0.07)
Basophils Absolute: 0 10*3/uL (ref 0.0–0.1)
Basophils Relative: 0 %
Eosinophils Absolute: 0 10*3/uL (ref 0.0–0.5)
Eosinophils Relative: 1 %
HCT: 39.9 % (ref 36.0–46.0)
Hemoglobin: 13.3 g/dL (ref 12.0–15.0)
Immature Granulocytes: 0 %
Lymphocytes Relative: 17 %
Lymphs Abs: 0.7 10*3/uL (ref 0.7–4.0)
MCH: 30.7 pg (ref 26.0–34.0)
MCHC: 33.3 g/dL (ref 30.0–36.0)
MCV: 92.1 fL (ref 80.0–100.0)
Monocytes Absolute: 0.5 10*3/uL (ref 0.1–1.0)
Monocytes Relative: 13 %
Neutro Abs: 2.6 10*3/uL (ref 1.7–7.7)
Neutrophils Relative %: 69 %
Platelets: 120 10*3/uL — ABNORMAL LOW (ref 150–400)
RBC: 4.33 MIL/uL (ref 3.87–5.11)
RDW: 13 % (ref 11.5–15.5)
WBC: 3.7 10*3/uL — ABNORMAL LOW (ref 4.0–10.5)
nRBC: 0 % (ref 0.0–0.2)

## 2018-02-16 LAB — COMPREHENSIVE METABOLIC PANEL
ALT: 21 U/L (ref 0–44)
AST: 21 U/L (ref 15–41)
Albumin: 3.7 g/dL (ref 3.5–5.0)
Alkaline Phosphatase: 51 U/L (ref 38–126)
Anion gap: 9 (ref 5–15)
BUN: 13 mg/dL (ref 6–20)
CO2: 25 mmol/L (ref 22–32)
Calcium: 9.1 mg/dL (ref 8.9–10.3)
Chloride: 109 mmol/L (ref 98–111)
Creatinine, Ser: 0.87 mg/dL (ref 0.44–1.00)
GFR calc Af Amer: >60 mL/min (ref >60)
GFR calc non Af Amer: >60 mL/min (ref >60)
Glucose, Bld: 117 mg/dL — ABNORMAL HIGH (ref 70–99)
Potassium: 3.7 mmol/L (ref 3.5–5.1)
Sodium: 143 mmol/L (ref 135–145)
Total Bilirubin: 0.5 mg/dL (ref 0.3–1.2)
Total Protein: 7.3 g/dL (ref 6.5–8.1)

## 2018-02-16 MED ORDER — AMOXICILLIN-POT CLAVULANATE 875-125 MG PO TABS: 1.0000 | ORAL_TABLET | Freq: Two times a day (BID) | ORAL | 0 refills | Status: AC

## 2018-02-16 MED ORDER — AZITHROMYCIN 250 MG PO TABS: ORAL_TABLET | ORAL | 0 refills | Status: DC

## 2018-02-16 MED ORDER — VENLAFAXINE HCL 37.5 MG PO TABS: 37.5000 mg | ORAL_TABLET | Freq: Two times a day (BID) | ORAL | 4 refills | Status: DC

## 2018-02-16 NOTE — Progress Notes (Signed)
Courtney Romero  [phone] [phone]     ID: Courtney Romero DOB: [birth year]  MR#: [id number]  VOZ#:[id number]  Patient Care Team: [name], MD as PCP - General (Unknown Physician Specialty) , Virgie Dad, MD as Consulting Physician (Oncology) Erroll Luna, MD as Consulting Physician (General Surgery) Dermatology, Pioneer Eppie Gibson, MD as Attending Physician (Radiation Oncology) Delice Bison, Charlestine Massed, NP as Nurse Practitioner (Hematology and Oncology) Benjaman Kindler, MD as Consulting Physician (Obstetrics and Gynecology) OTHER MD:  CHIEF COMPLAINT: Estrogen receptor positive breast cancer  CURRENT TREATMENT:  Tamoxifen  HISTORY OF CURRENT ILLNESS: From the original intake note:   The patient underwent bilateral screening mammography with tomography at the breast center 03/29/2017.  There were some calcifications associated with possible architectural distortion in the left breast.  She was recalled for left unilateral diagnostic mammography with ultrasonography 04/04/2017.  This found the breast density to be category C.  In the upper outer left breast there was a 1.2 cm group of coarse calcifications with persistent distortion.  This was not palpable on exam.  Ultrasound showed a 1.0 cm irregular mass in the left breast at the 11 o'clock position 3 cm from the nipple and also a 1.5 area of posterior acoustic shadowing at the 12 o'clock position 4 cm from the nipple.  The latter was felt to correspond to the area of suspicious calcifications.  Accordingly on 04/08/2017 the patient underwent biopsy of the 2 areas in question. 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  Both biopsy site appeared identical.  Only one prognostic panel was sent, from the 11:00 biopsy, showing  estrogen epidural 100% positive, estrogen receptor 100% positive, both with strong staining intensity, with an MIB-105%, and no HER-2 amplification, the signals ratio being 1.35 and the number per cell 2.10.  She underwent breast MRI 04/28/2017 showing, in the left breast, an area of mass and non-masslike enhancement overall measuring up to 4 cm.  In addition there were 3 separate lesions in the right breast suspicious for malignancy, requiring additional biopsy.   The patient's subsequent history is as detailed below.  INTERVAL HISTORY: Courtney Romero returns today for a follow-up and treatment of her estrogen receptor positive breast cancer. She continues on Tamoxifen, she is generally doing well on it. She has daily multiple hot flashes with associated sweating. She has night sweats but that doesn't wake her. She denies an increase in vaginal discharge. She reports leg cramps, they aren't as frequent as before. She drinks water often through out the day.  Since her last visit to the office, she underwent diagnostic unilateral left mammography with left ultrasonography at [facility] on 11/07/2017 with results showing: Breast density category C. Complex fluid collection in the 12 o'clock region of the left breast.   She then proceeded to aspiration of a cyst to the left breast several times with her most recent being on 11/17/2017.  Because of the seroma question her mammography which had been scheduled for October has been changed to December  She had an endoscopy on 12/12/2017 with pathology showing: Stomach polyps x2; cold biopsy: fundic gland polyp (2). Negative for H. Pylori, dysplasia, and malignancy. Esophagus, cold biopsy: Fragments of squamous mucosa with focal glycogen acanthosis. Negative for intraepithelial eosinophils, dysplasia, and malignancy. Colon polyp, transverse, cold biopsy: Colonic mucosa with focal lymphoid aggregate. Negative for dysplasia and  malignancy. Colon polyp, sigmoid, cold  biopsy: Small serrated polyp with features consistent with early sessile. Serrated adenoma. Negative for high grade dysplasia and malignancy.  She will have a repeat study in 5 years  She had a CT Chest wo contrast on 02/15/2018 that showed: Patchy right middle lobe opacity with air bronchograms, new, suspicious for pneumonia. Status post left breast lumpectomy with radiation changes. 12 mm short axis left axillary node, new. Stable right lung nodules measuring 4-6 mm, benign.  She has had a cough but no fever and no phlegm production; she denies pleurisy   REVIEW OF SYSTEMS: Courtney Romero is doing well overall. She tries to get to the gym and do some walking but had an large seroma, 45 cc were taken the first aspiration and 40 cc were taken the second time. She feels scar tissue and was worried that it will come back.The patient denies unusual headaches, visual changes, nausea, vomiting, or dizziness. There has been no unusual cough, phlegm production, or pleurisy. This been no change in bowel or bladder habits. The patient denies unexplained weight loss, bleeding, rash, or fever. A detailed review of systems was otherwise noncontributory.    PAST MEDICAL HISTORY: Past Medical History:  Diagnosis Date  . Abnormal CAT scan 2013  . Anxiety   . Cancer ([place])   . Family history of breast cancer   . Family history of pancreatic cancer   . Family history of prostate cancer   . GERD (gastroesophageal reflux disease)   . Hepatitis C 2001   Followed by Dr. Charlean Sanfilippo [facility]  . History of radiation therapy 07/26/17- 08/22/17   40.05 Gy directed to the left breast in 15 fractions followed by a boost of 10 Gy in 5 fractions.   . Personal history of radiation therapy   . PONV (postoperative nausea and vomiting)     PAST SURGICAL HISTORY: Past Surgical History:  Procedure Laterality Date  . ABDOMINAL HYSTERECTOMY  2009  . breast cyst removal  1995  . BREAST LUMPECTOMY    . BREAST LUMPECTOMY WITH  RADIOACTIVE SEED AND SENTINEL LYMPH NODE BIOPSY Left 06/14/2017   Procedure: LEFT BREAST SEED LOCALIZED LUMPECTOMY (2 SEEDS) AND SENTINEL LYMPH NODE BIOPSY;  Surgeon: Erroll Luna, MD;  Location: [place];  Service: General;  Laterality: Left;  . COLONOSCOPY  2011  . COLONOSCOPY WITH PROPOFOL N/A 12/12/2017   Procedure: COLONOSCOPY WITH PROPOFOL;  Surgeon: Jonathon Bellows, MD;  Location: [facility] ENDOSCOPY;  Service: Gastroenterology;  Laterality: N/A;  . ESOPHAGOGASTRODUODENOSCOPY (EGD) WITH PROPOFOL N/A 12/12/2017   Procedure: ESOPHAGOGASTRODUODENOSCOPY (EGD) WITH PROPOFOL;  Surgeon: Jonathon Bellows, MD;  Location: [facility] ENDOSCOPY;  Service: Gastroenterology;  Laterality: N/A;  . FLEXIBLE SIGMOIDOSCOPY  February 12, 2013   have normal perianal exam, question focal prolapse. 2 small scars in the rectum.  Marland Kitchen HEMORRHOID BANDING  2012   3 times over three months  . SIGMOIDOSCOPY  2013  . TONSILECTOMY/ADENOIDECTOMY WITH MYRINGOTOMY  remote  . UPPER GI ENDOSCOPY  2013    FAMILY HISTORY Family History  Problem Relation Age of Onset  . Hypertension Mother   . Thyroid disease Mother        Multi problems  . Coronary artery disease Father   . Heart failure Father   . Atrial fibrillation Father   . Diabetes Brother    As of 2018, the patient's father is 43 years old.  As of March 2019 the patient's mother is under hospice care and not expected to survive  the month.  The patient has three brothers and no sisters. Her maternal great aunt had breast cancer when she was after 33 and her other maternal great aunt had pancreatic cancer. She has no family history of ovarian cancer. The patient's father had prostate cancer in his 6's.   GYNECOLOGIC HISTORY:  No LMP recorded. Patient has had a hysterectomy.  Menarche: 57 years old GP: She never carried a pregnancy to term LMP: Hysterectomy about 10 years ago, no salpingo-oophorectomy HRT: She was on Estradiol on and off the last three years.  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 She uses gloves when using colorings and other chemicals. Her husband [name] is retired and was in hosiery. They have one dog. She is not a Banker.     ADVANCED DIRECTIVES: Not in place   HEALTH MAINTENANCE: Social History   Tobacco Use  . Smoking status: Former Smoker    Packs/day: 1.50    Years: 20.00    Pack years: 30.00    Types: Cigarettes    Last attempt to quit: 04/19/1998    Years since quitting: 19.8  . Smokeless tobacco: Never Used  . Tobacco comment: quit smoking in 2000  Substance Use Topics  . Alcohol use: No  . Drug use: No     Colonoscopy: [facility] in [place]  PAP: Status post hysterectomy  Bone density: at 50 --does not know results   Allergies  Allergen Reactions  . Propoxyphene Rash    Uncoded Allergy. Allergen: mellaril, Other Reaction: Other reaction, double vision Uncoded Allergy. Allergen: mellaril, Other Reaction: Other reaction, double vision Uncoded Allergy. Allergen: mellaril, Other Reaction: Other reaction, double vision  . Thioridazine Hcl     REACTION: Reaction not known  . Bupropion Rash and Other (See Comments)    Insomnia Insomnia  . Citalopram Rash and Other (See Comments)    Lightheaded Lightheaded  . Duloxetine Rash and Other (See Comments)    Insomnia Insomnia  . Thioridazine Rash    REACTION: Reaction not known REACTION: Reaction not known    Current Outpatient Medications  Medication Sig Dispense Refill  . cholecalciferol (VITAMIN D) 1000 units tablet Take 1,000 Units by mouth daily.    Marland Kitchen LORazepam (ATIVAN) 0.5 MG tablet Take 1 mg by mouth as needed.     . Melatonin 5 MG TABS Take 1 tablet by mouth.    Marland Kitchen omeprazole (PRILOSEC) 40 MG capsule Take 40 mg by mouth daily.    . tamoxifen (NOLVADEX) 20 MG tablet Take 1 tablet (20 mg total) by mouth daily. 90 tablet 4  . zolpidem (AMBIEN) 5 MG tablet Take by mouth as needed.      No current  facility-administered medications for this visit.     OBJECTIVE: Middle-aged white woman in no acute distress  Vitals:   02/16/18 0951  BP: (!) 135/93  Pulse: 97  Resp: 18  Temp: 98.3 F (36.8 C)  SpO2: 97%     Body mass index is 23.57 kg/m.   Wt Readings from Last 3 Encounters:  02/16/18 150 lb 8 oz (68.3 kg)  02/07/18 155 lb (70.3 kg)  01/04/18 151 lb 12.8 oz (68.9 kg)      ECOG FS:1 - Symptomatic but completely ambulatory  Sclerae unicteric, EOMs intact No cervical or supraclavicular adenopathy Lungs no rales or rhonchi Heart regular rate and rhythm Abd soft, nontender, positive bowel sounds MSK no focal spinal tenderness, no upper extremity  lymphedema Neuro: nonfocal, well oriented, tearful and anxious affect Breasts: Breast is unremarkable.  The left breast status post lumpectomy and radiation.  There is some thickening of the skin associated with treatment and some duskiness as well none of which is suspicious for disease recurrence.  There is a slight dimple affecting the breast contour.  Both axillae are benign.  LAB RESULTS:  CMP     Component Value Date/Time   NA 143 02/16/2018 0928   K 3.7 02/16/2018 0928   CL 109 02/16/2018 0928   CO2 25 02/16/2018 0928   GLUCOSE 117 (H) 02/16/2018 0928   BUN 13 02/16/2018 0928   CREATININE 0.87 02/16/2018 0928   CREATININE 0.91 04/29/2017 1348   CALCIUM 9.1 02/16/2018 0928   PROT 7.3 02/16/2018 0928   ALBUMIN 3.7 02/16/2018 0928   AST 21 02/16/2018 0928   AST 15 04/29/2017 1348   ALT 21 02/16/2018 0928   ALT 14 04/29/2017 1348   ALKPHOS 51 02/16/2018 0928   BILITOT 0.5 02/16/2018 0928   BILITOT 0.7 04/29/2017 1348   GFRNONAA >60 02/16/2018 0928   GFRNONAA >60 04/29/2017 1348   GFRAA >60 02/16/2018 0928   GFRAA >60 04/29/2017 1348    No results found for: TOTALPROTELP, ALBUMINELP, A1GS, A2GS, BETS, BETA2SER, GAMS, MSPIKE, SPEI  No results found for: KPAFRELGTCHN, LAMBDASER, KAPLAMBRATIO  Lab Results    Component Value Date   WBC 3.7 (L) 02/16/2018   NEUTROABS 2.6 02/16/2018   HGB 13.3 02/16/2018   HCT 39.9 02/16/2018   MCV 92.1 02/16/2018   PLT 120 (L) 02/16/2018    _0 @  No results found for: LABCA2  No components found for: FYBOFB510  No results for input(s): INR in the last 168 hours.  No results found for: LABCA2  No results found for: CHE527  No results found for: POE423  No results found for: NTI144  No results found for: CA2729  No components found for: HGQUANT  No results found for: CEA1 / No results found for: CEA1   No results found for: AFPTUMOR  No results found for: CHROMOGRNA  No results found for: PSA1    No results found for: TOTALPROTELP, ALBUMINELP, A1GS, A2GS, BETS, BETA2SER, GAMS, MSPIKE, SPEI (this displays SPEP labs)  No results found for: KPAFRELGTCHN, LAMBDASER, KAPLAMBRATIO (kappa/lambda light chains)  No results found for: HGBA, HGBA2QUANT, HGBFQUANT, HGBSQUAN (Hemoglobinopathy evaluation)   No results found for: LDH  No results found for: IRON, TIBC, IRONPCTSAT (Iron and TIBC)  No results found for: FERRITIN  Urinalysis    Component Value Date/Time   COLORURINE YELLOW (A) 06/12/2017 1911   APPEARANCEUR HAZY (A) 06/12/2017 1911   LABSPEC 1.025 06/12/2017 1911   PHURINE 5.0 06/12/2017 1911   GLUCOSEU NEGATIVE 06/12/2017 1911   HGBUR NEGATIVE 06/12/2017 1911   BILIRUBINUR NEGATIVE 06/12/2017 1911   KETONESUR NEGATIVE 06/12/2017 1911   PROTEINUR NEGATIVE 06/12/2017 1911   UROBILINOGEN 0.2 08/17/2011 0902   NITRITE NEGATIVE 06/12/2017 1911   LEUKOCYTESUR NEGATIVE 06/12/2017 1911     STUDIES: Ct Chest Wo Contrast  Result Date: 02/15/2018 CLINICAL DATA:  Follow-up pulmonary nodules. History of left breast cancer status post lumpectomy and radiation therapy. EXAM: CT CHEST WITHOUT CONTRAST TECHNIQUE: Multidetector CT imaging of the chest was performed following the standard protocol without IV contrast.  COMPARISON:  01/04/2017 FINDINGS: Cardiovascular: Heart is normal in size. No pericardial effusion. Ectasia of the ascending thoracic aorta, measuring 9.6 cm. Very mild atherosclerotic calcifications of the aortic arch. Mild three-vessel coronary atherosclerosis. Mediastinum/Nodes:  Small mediastinal lymph nodes, including a 7 mm short axis subcarinal node. 12 mm short axis left axillary node (series 2/image 41), new. Mild surrounding inflammatory changes. Visualized thyroid is unremarkable. Lungs/Pleura: Patchy right middle lobe opacity with air bronchograms (series 3/image 104), new, suspicious for pneumonia. 6 mm posterior right lower lobe nodule (series 3/image 94), unchanged. 4 mm triangular subpleural nodule in the anterior right lower lobe along the fissure (series 3/image 80), unchanged. No new/suspicious pulmonary nodules. No pleural effusion or pneumothorax. Upper Abdomen: 1.7 cm left adrenal adenoma, benign. Musculoskeletal: Status post left breast lumpectomy with radiation changes. Visualized osseous structures are within normal limits. IMPRESSION: Patchy right middle lobe opacity with air bronchograms, new, suspicious for pneumonia. Status post left breast lumpectomy with radiation changes. 12 mm short axis left axillary node, new. While there are mild surrounding inflammatory changes and this may be reactive in etiology, recurrence/metastasis is not excluded. Consider attention on short-term follow-up versus tissue sampling. Stable right lung nodules measuring 4-6 mm, benign. Aortic Atherosclerosis (ICD10-I70.0). Electronically Signed   By: Julian Hy M.D.   On: 02/15/2018 10:54    ELIGIBLE FOR AVAILABLE RESEARCH PROTOCOL: no  ASSESSMENT: 56 y.o. [place], [place] woman status post biopsy of 2 areas in the upper outer quadrant of the left breast 04/08/2017, both showing extensive ductal carcinoma in situ low-grade, but both showing invasive ductal carcinoma (measuring 2-3 mm on the slides), grade  1, estrogen and progesterone receptor positive, HER-2 not amplified, with an MIB-1 of 5%  (1) left lumpectomy and sentinel lymph node sampling 02/26/ 2019 showed only residual ductal carcinoma in situ, low-grade, with negative margins.  (a) a total of 3 axillary lymph nodes were removed  (2) adjuvant radiation 07/26/2017-08/22/2017 Site/dose:40.05 Gy directed to the left breast in 15 fractions followed by a boost of 10 Gy in 5 fractions  (3) genetics testing 07/12/2017 through theCommon Hereditary Cancer Panel offered by [facility] i found no deleterious mutations in APC, ATM, AXIN2, BARD1, BMPR1A, BRCA1, BRCA2, BRIP1, CDH1, CDKN2A (p14ARF), CDKN2A (p16INK4a), CKD4, CHEK2, CTNNA1, DICER1, EPCAM (Deletion/duplication testing only), GREM1 (promoter region deletion/duplication testing only), KIT, MEN1, MLH1, MSH2, MSH3, MSH6, MUTYH, NBN, NF1, NHTL1, PALB2, PDGFRA, PMS2, POLD1, POLE, PTEN, RAD50, RAD51C, RAD51D, SDHB, SDHC, SDHD, SMAD4, SMARCA4. S  (5) tamoxifen started neoadjuvantly 04/29/2017    PLAN: Courtney Romero is 9 months out from definitive surgery for her breast cancer.  She continues to have a variety of issues, including the seroma, her husband's health, discomfort associated with the left upper extremity movements, and some depression.  I think it would be helpful if she participated in the finding your new normal program and I gave her information on this.  She is "crying all the time".  She tried venlafaxine previously but it caused her to yawn.  She tells me she has tried sertraline and bupropion in the past and has not tolerated them well.  I think it might be possible to start her on the non-extended release venlafaxine, at a very low dose, 37.5, and see how she tolerates that.  If she takes it 2 or 3 weeks and it seems to be helping and not causing problems she will let us know and we will up the dose to twice daily and eventually try to get her to 75 mg daily, which I think would be  helpful.  As far as a dimple in her left breast is concerned if she wishes to have that corrected she will let me know and I will refer  her to plastic surgery  Her CT scan shows very stable lung nodules but she does have what looks like early pneumonia in the right side and I am starting her on a Z-Pak to prevent that from developing further.  She will see me again in January or early February.  She knows to call for any other issues that may develop before that visit.   Courtney Romero, Virgie Dad, MD  02/16/18 10:29 AM Medical Oncology and Hematology [facility] [address] [place], [place] [address] Tel. [phone]    Fax. [phone]  Elie Goody, am acting as a scribe for Chauncey Cruel, MD.   I, Lurline Del MD, have reviewed the above documentation for accuracy and completeness, and I agree with the above.

## 2018-02-16 NOTE — Telephone Encounter (Signed)
Spoke to patient. Let her know that her PFT's were normal and her lung nodules remain unchanged per Dr. Zoila Shutter note. Patient understands. She states she is still short of breath. Advised her to start the Augmentin and give the antibiotic a chance to work (just picked it up today) and let us know if her breathing does not improve in a week. Patient understands.

## 2018-02-16 NOTE — Telephone Encounter (Signed)
Patient returned Dr. Zoila Shutter call. Let her know CT results and that Augmentin has been ordered for her.  She also wants to know results of PFT and status of lung nodules.

## 2018-02-16 NOTE — Progress Notes (Signed)
CT chest c/w RML lobar pneumonia AUGMENTIN 875 BID prescribed  I attempted to call patient, she did NOT answer her phone Left message to call office.

## 2018-02-16 NOTE — Telephone Encounter (Signed)
NORMAL  PFT's  Nodules are unchanged

## 2018-02-16 NOTE — Telephone Encounter (Signed)
Gave avs and calendar ° °

## 2018-02-17 ENCOUNTER — Ambulatory Visit: Attending: Surgery | Admitting: Occupational Therapy

## 2018-02-17 DIAGNOSIS — L905 Scar conditions and fibrosis of skin: Secondary | ICD-10-CM

## 2018-02-17 DIAGNOSIS — M25612 Stiffness of left shoulder, not elsewhere classified: Secondary | ICD-10-CM

## 2018-02-17 DIAGNOSIS — R29898 Other symptoms and signs involving the musculoskeletal system: Secondary | ICD-10-CM

## 2018-02-17 DIAGNOSIS — Z483 Aftercare following surgery for neoplasm: Secondary | ICD-10-CM

## 2018-02-17 NOTE — Therapy (Signed)
[facility] [address] [place], [address] Phone: [phone]   Fax:  [phone]  Occupational Therapy Treatment  Patient Details  Name: Courtney Romero MRN: [id number] Date of Birth: 04/24/1960 No data recorded  Encounter Date: 02/17/2018  OT End of Session - 02/17/18 1114    Visit Number  3    Number of Visits  8    Date for OT Re-Evaluation  03/30/18    OT Start Time  0820    OT Stop Time  0850    OT Time Calculation (min)  30 min    Activity Tolerance  Patient tolerated treatment well    Behavior During Therapy  [facility] for tasks assessed/performed       Past Medical History:  Diagnosis Date  . Abnormal CAT scan 2013  . Anxiety   . Cancer ([place])   . Family history of breast cancer   . Family history of pancreatic cancer   . Family history of prostate cancer   . GERD (gastroesophageal reflux disease)   . Hepatitis C 2001   Followed by Dr. Charlean Sanfilippo at [facility]  . History of radiation therapy 07/26/17- 08/22/17   40.05 Gy directed to the left breast in 15 fractions followed by a boost of 10 Gy in 5 fractions.   . Personal history of radiation therapy   . PONV (postoperative nausea and vomiting)     Past Surgical History:  Procedure Laterality Date  . ABDOMINAL HYSTERECTOMY  2009  . breast cyst removal  1995  . BREAST LUMPECTOMY    . BREAST LUMPECTOMY WITH RADIOACTIVE SEED AND SENTINEL LYMPH NODE BIOPSY Left 06/14/2017   Procedure: LEFT BREAST SEED LOCALIZED LUMPECTOMY (2 SEEDS) AND SENTINEL LYMPH NODE BIOPSY;  Surgeon: Erroll Luna, MD;  Location: [place];  Service: General;  Laterality: Left;  . COLONOSCOPY  2011  . COLONOSCOPY WITH PROPOFOL N/A 12/12/2017   Procedure: COLONOSCOPY WITH PROPOFOL;  Surgeon: Jonathon Bellows, MD;  Location: [facility] ENDOSCOPY;  Service: Gastroenterology;  Laterality: N/A;  . ESOPHAGOGASTRODUODENOSCOPY (EGD) WITH PROPOFOL N/A 12/12/2017   Procedure:  ESOPHAGOGASTRODUODENOSCOPY (EGD) WITH PROPOFOL;  Surgeon: Jonathon Bellows, MD;  Location: [facility] ENDOSCOPY;  Service: Gastroenterology;  Laterality: N/A;  . FLEXIBLE SIGMOIDOSCOPY  February 12, 2013   have normal perianal exam, question focal prolapse. 2 small scars in the rectum.  Marland Kitchen HEMORRHOID BANDING  2012   3 times over three months  . SIGMOIDOSCOPY  2013  . TONSILECTOMY/ADENOIDECTOMY WITH MYRINGOTOMY  remote  . UPPER GI ENDOSCOPY  2013    There were no vitals filed for this visit.  Subjective Assessment - 02/17/18 1107    Subjective   I did the stretches and moving better - still pull over the top of breast where scar is - but motion is better -    Patient Stated Goals  Want to get my L breast better , the tightness/pain  and range of motion so I can workout in gym , lift objects and pull shirt over my head     Currently in Pain?  No/denies         Scapula mobs done in all directions -  And soft tissue mobs to axilla - myofascial release - and arm into flexion and external rotation - able to tolerate better this date  Done some gentle scar mobs  And   soft manual circles,criss cross and along scar - and soft tissue on superior L breast -  Can cont to do at home 1 x day by husband or pt  - 2-3 min    Pt to cont with Golf club stretch in supine gentle into scaption over head for L  10 reps slight pull or stretch  2 x day  Add this date stabilization exercises supine for shoulder at 90 degrees  - 3 x 30 sec - tapping by husband all directions  On wall 90 degrees shoulder flex - ball  - 3 x 30 sec small circles  Scapula retraction and protraction in supine  - 10 reps  If not pain or discomfort - can do 2 lbs in 3-4 days   cont  kinetiotape to L breast - with achor behind L clavicle -and 3 fingers down to superior breast - and scar   pt can replace it  3 x until next week - but ed on precautions -and need during daytime  Leave of at night time But add this date cica scar pad for  night time  And kinesiotape 30% pull either side of scar - and across 2 at 100% pull - can do during day use  4 hrs                        OT Education - 02/17/18 1113    Education Details  HEP add stabilization for scapula and taping -     Person(s) Educated  Patient    Methods  Explanation;Demonstration;Handout    Comprehension  Returned demonstration;Verbalized understanding       OT Short Term Goals - 02/02/18 2053      OT SHORT TERM GOAL #1   Title   Pt. will be independent with home exercise program for shoulder ROM and decrease discomfort and pull into chest less than 3/10     Baseline  discomfort or pain increase to 7/10 with external rotation and ABD /horizontal abd     Time  3    Period  Weeks    Status  New    Target Date  02/23/18        OT Long Term Goals - 02/02/18 2055      OT LONG TERM GOAL #1   Title  Pt. will report at least 50% decrease in discomfort and greater ease in reaching left arm forward, up and out     Baseline  70% discomfort into chest wall and axiall where cording it     Time  4    Period  Weeks    Status  New    Target Date  03/02/18      OT LONG TERM GOAL #2   Title  Pt will return to gym activities without limitations  for planks and weights     Baseline  cannot do weight bearing or weights     Time  8    Period  Weeks    Status  New    Target Date  03/30/18            Plan - 02/17/18 1114    Clinical Impression Statement  Pt cording better- not visible and not palpatable - show increase shoulder AROM and less of stretch - add this date stabilization to scapula and shoulder and can increase in few days if no issues to 2 lbs weight     Occupational performance deficits (Please refer to evaluation for details):  ADL's;IADL's;Play;Leisure    Rehab Potential  Good    OT  Frequency  1x / week    OT Duration  6 weeks    OT Treatment/Interventions  Self-care/ADL training;Therapeutic exercise;Manual lymph  drainage;Manual Therapy;Passive range of motion;Scar mobilization;Patient/family education    Plan  assess progress with HEP and change as needed     Clinical Decision Making  Limited treatment options, no task modification necessary    OT Home Exercise Plan  see pt instruciton     Consulted and Agree with Plan of Care  Patient       Patient will benefit from skilled therapeutic intervention in order to improve the following deficits and impairments:  Pain, Impaired flexibility, Decreased scar mobility, Impaired UE functional use, Decreased strength, Decreased range of motion  Visit Diagnosis: Stiffness of left shoulder, not elsewhere classified  Aftercare following surgery for neoplasm  Other symptoms and signs involving the musculoskeletal system  Scar condition and fibrosis of skin    Problem List Patient Active Problem List   Diagnosis Date Noted  . Genetic testing 07/28/2017  . GERD without esophagitis 07/05/2017  . Family history of pancreatic cancer   . Family history of prostate cancer   . Family history of breast cancer   . Aortic atherosclerosis ([place]) 04/28/2017  . Malignant neoplasm of upper-inner quadrant of left breast in female, estrogen receptor positive ([place]) 04/27/2017  . Hepatitis C, chronic ([place]) 09/21/2016  . Health care maintenance 07/29/2015  . Menopausal disorder 01/20/2014  . 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 Rectal pain 05/31/2013  . Hemorrhoids 05/31/2013  . Rectal fissure 11/20/2012  . 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 Bloating 10/03/2012  . Nausea 10/03/2012  . Anal pain 05/09/2012  . Anxiety and depression 04/04/2012  . Diverticulosis 03/21/2012  . HEPATITIS C 01/08/2009  . SHORTNESS OF BREATH 01/08/2009    Rosalyn Gess OTR/L,CLT 02/17/2018, 11:17 AM  [facility] [address], [place], [address] Phone: [phone]   Fax:  [phone]  Name:  BARRY CULVERHOUSE MRN: [id number] Date of Birth: 1960/11/08

## 2018-02-17 NOTE — Patient Instructions (Signed)
Add stabilization exercises supine for shoulder at 90 degrees  - 3 x 30 sec - tapping by husband all directions  On wall 90 degrees shoulder flex at 90 degrees - ball - 3 x 30 sec   Scapula retraction and protraction - 10 reps  If not pain or discomfort - can do 2 lbs in 3-4 days  and for external rotation stretch - do only golf club one - back off on others

## 2018-02-20 ENCOUNTER — Telehealth: Payer: Self-pay

## 2018-02-20 NOTE — Telephone Encounter (Signed)
Duplicate message Patient also responded by mychart. Response submitted via mychart. Nothing further needed.

## 2018-02-20 NOTE — Telephone Encounter (Signed)
Pt c/o medication issue:  1. Name of Medication: pt is not sure, states it is a "big horse pill", an antibiotic  2. How are you currently taking this medication (dosage and times per day)? 2 a day/   3. Are you having a reaction (difficulty breathing--STAT)? no  4. What is your medication issue? Making nauseated

## 2018-02-21 ENCOUNTER — Encounter: Payer: Self-pay | Admitting: Oncology

## 2018-02-24 ENCOUNTER — Ambulatory Visit: Admitting: Occupational Therapy

## 2018-02-27 ENCOUNTER — Ambulatory Visit: Admitting: Occupational Therapy

## 2018-02-27 DIAGNOSIS — M25612 Stiffness of left shoulder, not elsewhere classified: Secondary | ICD-10-CM

## 2018-02-27 DIAGNOSIS — R29898 Other symptoms and signs involving the musculoskeletal system: Secondary | ICD-10-CM

## 2018-02-27 DIAGNOSIS — L905 Scar conditions and fibrosis of skin: Secondary | ICD-10-CM

## 2018-02-27 DIAGNOSIS — Z483 Aftercare following surgery for neoplasm: Secondary | ICD-10-CM

## 2018-02-27 NOTE — Patient Instructions (Signed)
Cont with gentle over head and horizontal abd with golf club - not over do it and actively during day  Cont with 1 lbs for protraction and retraction  And ball on the wall for stabilization  10 reps   Add this date standing against wall with back :  10-15 reps   1  And 2: arms out to side on wall at 90 degrees with palm forward and posterior   into retraction -  3: Traction against wall at 90 degrees with hands to front  4: Retraction of scapula with arms into ext rotation - against wall   Pain free  - if good in 4 days  Can add 1 lbs weight

## 2018-02-27 NOTE — Therapy (Signed)
[facility] [address] [place], [address] Phone: [phone]   Fax:  [phone]  Occupational Therapy Treatment  Patient Details  Name: Courtney Romero MRN: [id number] Date of Birth: 1960/10/25 No data recorded  Encounter Date: 02/27/2018  OT End of Session - 02/27/18 1748    Visit Number  4    Number of Visits  8    Date for OT Re-Evaluation  03/30/18    OT Start Time  1101    OT Stop Time  1129    OT Time Calculation (min)  28 min    Activity Tolerance  Patient tolerated treatment well    Behavior During Therapy  [facility] for tasks assessed/performed       Past Medical History:  Diagnosis Date  . Abnormal CAT scan 2013  . Anxiety   . Cancer ([place])   . Family history of breast cancer   . Family history of pancreatic cancer   . Family history of prostate cancer   . GERD (gastroesophageal reflux disease)   . Hepatitis C 2001   Followed by Dr. Charlean Sanfilippo at [facility]  . History of radiation therapy 07/26/17- 08/22/17   40.05 Gy directed to the left breast in 15 fractions followed by a boost of 10 Gy in 5 fractions.   . Personal history of radiation therapy   . PONV (postoperative nausea and vomiting)     Past Surgical History:  Procedure Laterality Date  . ABDOMINAL HYSTERECTOMY  2009  . breast cyst removal  1995  . BREAST LUMPECTOMY    . BREAST LUMPECTOMY WITH RADIOACTIVE SEED AND SENTINEL LYMPH NODE BIOPSY Left 06/14/2017   Procedure: LEFT BREAST SEED LOCALIZED LUMPECTOMY (2 SEEDS) AND SENTINEL LYMPH NODE BIOPSY;  Surgeon: Erroll Luna, MD;  Location: [place];  Service: General;  Laterality: Left;  . COLONOSCOPY  2011  . COLONOSCOPY WITH PROPOFOL N/A 12/12/2017   Procedure: COLONOSCOPY WITH PROPOFOL;  Surgeon: Jonathon Bellows, MD;  Location: [facility] ENDOSCOPY;  Service: Gastroenterology;  Laterality: N/A;  . ESOPHAGOGASTRODUODENOSCOPY (EGD) WITH PROPOFOL N/A 12/12/2017   Procedure:  ESOPHAGOGASTRODUODENOSCOPY (EGD) WITH PROPOFOL;  Surgeon: Jonathon Bellows, MD;  Location: [facility] ENDOSCOPY;  Service: Gastroenterology;  Laterality: N/A;  . FLEXIBLE SIGMOIDOSCOPY  February 12, 2013   have normal perianal exam, question focal prolapse. 2 small scars in the rectum.  Marland Kitchen HEMORRHOID BANDING  2012   3 times over three months  . SIGMOIDOSCOPY  2013  . TONSILECTOMY/ADENOIDECTOMY WITH MYRINGOTOMY  remote  . UPPER GI ENDOSCOPY  2013    There were no vitals filed for this visit.  Subjective Assessment - 02/27/18 1742    Subjective   I did the stretches and moving better -  but still feel pull over the top of breast where scar is - but motion is better - feel pull under my arm and back of shoulder     Patient Stated Goals  Want to get my L breast better , the tightness/pain  and range of motion so I can workout in gym , lift objects and pull shirt over my head     Currently in Pain?  Yes    Pain Score  3     Pain Location  Breast    Pain Orientation  Left    Pain Descriptors / Indicators  Tightness   PUll    Pain Type  Surgical pain    Pain Onset  More than a month ago  Aggravating Factors   at end range out and over head         Scapula mobs done in all directions -  And soft tissue mobs to axilla - myofascial release - and arm into flexion and external rotation - able to tolerate better this date - use graston nr 2 brushing very gentle in axilla and upper arm  - small area of cording felt in axilla  soft manual circles,criss cross and along scar - and soft tissue on superior L breast - Can cont to do at home1 x day by husbandor pt- 2-3 min    Pt to cont with Golf club stretch in supine gentle into scaption over head for L  10 reps slight pull or stretch  2 x day  BUT NOT OVER DO  Can cont with  stabilization exercises supine for shoulder at 90 degrees  - 3 x 30 sec On wall 90 degrees shoulder flex - ball  - 3 x 30 sec small circles  If not pain or discomfort - can  do 2 lbs in 3-4 days   cica scar pad for night time  And kinesiotape 30% pull either side of scar - and across 2 at 100% pull - can do during day use  4 hrs           add to  HEP   standing against wall with back :  10-15 reps   1  And 2: arms out to side on wall at 90 degrees with palm forward and posterior   into retraction -  3: Traction against wall at 90 degrees with hands to front  4: Retraction of scapula with arms into ext rotation - against wall   Pain free  - if good in 4 days  Can add 1 lbs weight          OT Education - 02/27/18 1747    Education Details  HEP and strengthening and taping to scar    Person(s) Educated  Patient    Methods  Explanation;Demonstration;Handout    Comprehension  Returned demonstration;Verbalized understanding       OT Short Term Goals - 02/02/18 2053      OT SHORT TERM GOAL #1   Title   Pt. will be independent with home exercise program for shoulder ROM and decrease discomfort and pull into chest less than 3/10     Baseline  discomfort or pain increase to 7/10 with external rotation and ABD /horizontal abd     Time  3    Period  Weeks    Status  New    Target Date  02/23/18        OT Long Term Goals - 02/02/18 2055      OT LONG TERM GOAL #1   Title  Pt. will report at least 50% decrease in discomfort and greater ease in reaching left arm forward, up and out     Baseline  70% discomfort into chest wall and axiall where cording it     Time  4    Period  Weeks    Status  New    Target Date  03/02/18      OT LONG TERM GOAL #2   Title  Pt will return to gym activities without limitations  for planks and weights     Baseline  cannot do weight bearing or weights     Time  8    Period  Weeks  Status  New    Target Date  03/30/18            Plan - 02/27/18 1748    Clinical Impression Statement  Pt show increase AROM in L shoulder and pain mostly in extreme over head horizontal abd - that she do not do a  lot of functional tasks in - initiated strengthening this date and reed pt again on scar mobs and use of tape to decrease scar tissue and can work on not weight and 1 lbs weight this next 10 days     Occupational performance deficits (Please refer to evaluation for details):  ADL's;IADL's;Play;Leisure    Rehab Potential  Good    OT Frequency  Biweekly    OT Duration  4 weeks    OT Treatment/Interventions  Self-care/ADL training;Therapeutic exercise;Manual lymph drainage;Manual Therapy;Passive range of motion;Scar mobilization;Patient/family education    Plan  assess progress with HEP and change as needed     Clinical Decision Making  Limited treatment options, no task modification necessary    OT Home Exercise Plan  see pt instruciton     Consulted and Agree with Plan of Care  Patient       Patient will benefit from skilled therapeutic intervention in order to improve the following deficits and impairments:  Pain, Impaired flexibility, Decreased scar mobility, Impaired UE functional use, Decreased strength, Decreased range of motion  Visit Diagnosis: Stiffness of left shoulder, not elsewhere classified  Aftercare following surgery for neoplasm  Other symptoms and signs involving the musculoskeletal system  Scar condition and fibrosis of skin    Problem List Patient Active Problem List   Diagnosis Date Noted  . Genetic testing 07/28/2017  . GERD without esophagitis 07/05/2017  . Family history of pancreatic cancer   . Family history of prostate cancer   . Family history of breast cancer   . Aortic atherosclerosis ([place]) 04/28/2017  . Malignant neoplasm of upper-inner quadrant of left breast in female, estrogen receptor positive ([place]) 04/27/2017  . Hepatitis C, chronic ([place]) 09/21/2016  . Health care maintenance 07/29/2015  . Menopausal disorder 01/20/2014  . 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 Rectal pain 05/31/2013  . Hemorrhoids 05/31/2013  . Rectal fissure 11/20/2012  . Small  intestinal bacterial overgrowth 10/11/2012  . Bloating 10/03/2012  . Nausea 10/03/2012  . Anal pain 05/09/2012  . Anxiety and depression 04/04/2012  . Diverticulosis 03/21/2012  . HEPATITIS C 01/08/2009  . SHORTNESS OF BREATH 01/08/2009    Rosalyn Gess OTR/L,CLT 02/27/2018, 5:50 PM  [facility] [address] [place], [address] Phone: [phone]   Fax:  [phone]  Name: Courtney Romero MRN: [id number] Date of Birth: 24-Oct-1960

## 2018-03-02 ENCOUNTER — Ambulatory Visit: Attending: Surgery | Admitting: Rehabilitation

## 2018-03-02 ENCOUNTER — Encounter: Payer: Self-pay | Admitting: Rehabilitation

## 2018-03-02 DIAGNOSIS — R29898 Other symptoms and signs involving the musculoskeletal system: Secondary | ICD-10-CM | POA: Insufficient documentation

## 2018-03-02 DIAGNOSIS — L905 Scar conditions and fibrosis of skin: Secondary | ICD-10-CM | POA: Insufficient documentation

## 2018-03-02 DIAGNOSIS — M25612 Stiffness of left shoulder, not elsewhere classified: Secondary | ICD-10-CM

## 2018-03-02 DIAGNOSIS — Z483 Aftercare following surgery for neoplasm: Secondary | ICD-10-CM

## 2018-03-02 NOTE — Patient Instructions (Signed)
updated stretches to: turning point handout; open book elbows bent, single arm doorway, XYW

## 2018-03-02 NOTE — Therapy (Signed)
[place], [place], [address] Phone: [phone]   Fax:  [phone]  Physical Therapy Treatment  Patient Details  Name: ROCIO ROAM MRN: [id number] Date of Birth: 1960-11-08 Referring Provider (PT): Dr. Erroll Luna   Encounter Date: 03/02/2018  PT End of Session - 03/02/18 1736    Visit Number  2    Number of Visits  8    Date for PT Re-Evaluation  03/30/18    PT Start Time  2706    PT Stop Time  1555    PT Time Calculation (min)  40 min    Activity Tolerance  Patient tolerated treatment well    Behavior During Therapy  [facility] for tasks assessed/performed       Past Medical History:  Diagnosis Date  . Abnormal CAT scan 2013  . Anxiety   . Cancer ([place])   . Family history of breast cancer   . Family history of pancreatic cancer   . Family history of prostate cancer   . GERD (gastroesophageal reflux disease)   . Hepatitis C 2001   Followed by Dr. Charlean Sanfilippo at [facility]  . History of radiation therapy 07/26/17- 08/22/17   40.05 Gy directed to the left breast in 15 fractions followed by a boost of 10 Gy in 5 fractions.   . Personal history of radiation therapy   . PONV (postoperative nausea and vomiting)     Past Surgical History:  Procedure Laterality Date  . ABDOMINAL HYSTERECTOMY  2009  . breast cyst removal  1995  . BREAST LUMPECTOMY    . BREAST LUMPECTOMY WITH RADIOACTIVE SEED AND SENTINEL LYMPH NODE BIOPSY Left 06/14/2017   Procedure: LEFT BREAST SEED LOCALIZED LUMPECTOMY (2 SEEDS) AND SENTINEL LYMPH NODE BIOPSY;  Surgeon: Erroll Luna, MD;  Location: [place];  Service: General;  Laterality: Left;  . COLONOSCOPY  2011  . COLONOSCOPY WITH PROPOFOL N/A 12/12/2017   Procedure: COLONOSCOPY WITH PROPOFOL;  Surgeon: Jonathon Bellows, MD;  Location: [facility] ENDOSCOPY;  Service: Gastroenterology;  Laterality: N/A;  . ESOPHAGOGASTRODUODENOSCOPY (EGD) WITH PROPOFOL N/A 12/12/2017   Procedure: ESOPHAGOGASTRODUODENOSCOPY (EGD) WITH PROPOFOL;  Surgeon: Jonathon Bellows, MD;  Location: [facility] ENDOSCOPY;  Service: Gastroenterology;  Laterality: N/A;  . FLEXIBLE SIGMOIDOSCOPY  February 12, 2013   have normal perianal exam, question focal prolapse. 2 small scars in the rectum.  Marland Kitchen HEMORRHOID BANDING  2012   3 times over three months  . SIGMOIDOSCOPY  2013  . TONSILECTOMY/ADENOIDECTOMY WITH MYRINGOTOMY  remote  . UPPER GI ENDOSCOPY  2013    There were no vitals filed for this visit.  Subjective Assessment - 03/02/18 1516    Subjective  I think it is just getting worse.  I think I am needing more time spent on it.  It almost feels swollen     Pertinent History  Left breast cancer diagnosed 04/14/17 and had lumpectomy 06/14/17 with SLNB (3 nodes). Radiation was completed 08/22/17.  Has been on tamoxifen since January. Otherwise healthy. Did have a right shoulder impingement a year ago or so and says that is a lot better than it was..  Seroma came up in the superior breast and was drained 4 times.     Patient Stated Goals  improve the status of the Lt chest pull and arm movments    Currently in Pain?  No/denies   pain with movement 6/10 anytime I engage the pec muscle    Pain Orientation  Left  Pain Descriptors / Indicators  Aching;Tightness    Pain Type  Surgical pain    Pain Onset  More than a month ago    Pain Frequency  Intermittent         [facility] PT Assessment - 03/02/18 0001      Observation/Other Assessments   Observations  no sig edema present breast or lateral trunk but pt feeling like it is swollen lateral chest.  cording still present     Skin Integrity  tightness and fibrosis present on the lumpectomy incision       AROM   Left Shoulder Flexion  163 Degrees   pull into breast and lateral trunk   Left Shoulder ABduction  160 Degrees    Left Shoulder Internal Rotation  --   behind the back WNL   Left Shoulder External Rotation  --   behind the head WNL but with sig  pull inot the chest      PROM   Left Shoulder Flexion  175 Degrees    Left Shoulder ABduction  175 Degrees      Palpation   Palpation comment  hardened tissue / possibly scar tissue around seroma anterior breast                    [facility] Adult PT Treatment/Exercise - 03/02/18 0001      Exercises   Other Exercises   updated stretches to: turning point handout; open book elbows bent, single arm doorway, XYW      Manual Therapy   Manual Therapy  Myofascial release;Manual Lymphatic Drainage (MLD);Soft tissue mobilization    Soft tissue mobilization  scar work and STM into the Lt pectoralis, breast tissue, and lumpectomy incision.  Pt educated to continue scar massage. in neutral and flexion positions.  release to the Lt latissiums in supine netural and overhead flexion.  Latissiums work in [place]    Myofascial Release  above and below breast in different directions    Manual Lymphatic Drainage (MLD)  to the lateral trunk in supine and scapular region using posterior interaxillary pathway.               PT Education - 03/02/18 1736    Education provided  Yes    Education Details  updated stretches to: turning point handout; open book elbows bent, single arm doorway, XYW    Person(s) Educated  Patient    Methods  Explanation;Demonstration;Tactile cues;Verbal cues;Handout    Comprehension  Verbalized understanding          PT Long Term Goals - 03/02/18 1740      PT LONG TERM GOAL #1   Title  Pt. will report at least 50% decrease in discomfort and greater ease in reaching left arm forward and up.    Status  On-going      PT LONG TERM GOAL #2   Title  Pt. will be independent with home exercise program for shoulder ROM    Status  On-going      PT LONG TERM GOAL #3   Title  Pt will peform shoulder AROM WNL without increased pulling into the Lt chest wall     Status  On-going      PT LONG TERM GOAL #4   Title  Pt will return to gym activities without  limitations     Status  On-going            Plan - 03/02/18 1737    Clinical Impression Statement  Pt returns to this clinic after working some in [place].  She reports continued Rt pectoralis and breast and now lateral trunk pulling with overhead movements.  Her ROM has improved actively and passively but still with pulling now into the side body.  She will benefit from PT at this time with STM and work into the Lt breast, pectoralis, latissimus to improve Lt shoulder ROM with transition into strengthening so she can get back to the gym     PT Frequency  2x / week    PT Duration  4 weeks    PT Treatment/Interventions  ADLs/Self Care Home Management;Moist Heat;Therapeutic exercise;Patient/family education;Manual techniques;Manual lymph drainage;Compression bandaging;Scar mobilization;Passive range of motion    PT Next Visit Plan  continue Lt shoulder PROM with STM into breast/incision/pect/lat, stretching as tolerated    PT Home Exercise Plan  updated stretches to: turning point handout; open book elbows bent, single arm doorway, XYW       Patient will benefit from skilled therapeutic intervention in order to improve the following deficits and impairments:  Decreased range of motion, Increased fascial restricitons, Pain, Impaired UE functional use  Visit Diagnosis: Stiffness of left shoulder, not elsewhere classified  Aftercare following surgery for neoplasm  Other symptoms and signs involving the musculoskeletal system  Scar condition and fibrosis of skin     Problem List Patient Active Problem List   Diagnosis Date Noted  . Genetic testing 07/28/2017  . GERD without esophagitis 07/05/2017  . Family history of pancreatic cancer   . Family history of prostate cancer   . Family history of breast cancer   . Aortic atherosclerosis ([place]) 04/28/2017  . Malignant neoplasm of upper-inner quadrant of left breast in female, estrogen receptor positive ([place]) 04/27/2017  . Hepatitis  C, chronic ([place]) 09/21/2016  . Health care maintenance 07/29/2015  . Menopausal disorder 01/20/2014  . 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 Rectal pain 05/31/2013  . Hemorrhoids 05/31/2013  . Rectal fissure 11/20/2012  . 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 Bloating 10/03/2012  . Nausea 10/03/2012  . Anal pain 05/09/2012  . Anxiety and depression 04/04/2012  . Diverticulosis 03/21/2012  . HEPATITIS C 01/08/2009  . SHORTNESS OF BREATH 01/08/2009    Shan Levans, PT 03/02/2018, 5:41 PM  [place], [place], [address] Phone: [phone]   Fax:  [phone]  Name: AFTAN VINT MRN: [id number] Date of Birth: 03-15-1961

## 2018-03-08 ENCOUNTER — Ambulatory Visit

## 2018-03-08 DIAGNOSIS — L905 Scar conditions and fibrosis of skin: Secondary | ICD-10-CM | POA: Diagnosis present

## 2018-03-08 DIAGNOSIS — M25612 Stiffness of left shoulder, not elsewhere classified: Secondary | ICD-10-CM

## 2018-03-08 DIAGNOSIS — R29898 Other symptoms and signs involving the musculoskeletal system: Secondary | ICD-10-CM | POA: Diagnosis present

## 2018-03-08 DIAGNOSIS — Z483 Aftercare following surgery for neoplasm: Secondary | ICD-10-CM

## 2018-03-08 NOTE — Therapy (Signed)
[place], [place], [address] Phone: [phone]   Fax:  [phone]  Physical Therapy Treatment  Patient Details  Name: Courtney Romero MRN: [id number] Date of Birth: 1960/08/06 Referring Provider (PT): Dr. Erroll Luna   Encounter Date: 03/08/2018  PT End of Session - 03/08/18 1520    Visit Number  3    Number of Visits  8    Date for PT Re-Evaluation  03/30/18    PT Start Time  8466    PT Stop Time  1519    PT Time Calculation (min)  43 min    Activity Tolerance  Patient tolerated treatment well    Behavior During Therapy  [facility] for tasks assessed/performed       Past Medical History:  Diagnosis Date  . Abnormal CAT scan 2013  . Anxiety   . Cancer ([place])   . Family history of breast cancer   . Family history of pancreatic cancer   . Family history of prostate cancer   . GERD (gastroesophageal reflux disease)   . Hepatitis C 2001   Followed by Dr. Charlean Sanfilippo at [facility]  . History of radiation therapy 07/26/17- 08/22/17   40.05 Gy directed to the left breast in 15 fractions followed by a boost of 10 Gy in 5 fractions.   . Personal history of radiation therapy   . PONV (postoperative nausea and vomiting)     Past Surgical History:  Procedure Laterality Date  . ABDOMINAL HYSTERECTOMY  2009  . breast cyst removal  1995  . BREAST LUMPECTOMY    . BREAST LUMPECTOMY WITH RADIOACTIVE SEED AND SENTINEL LYMPH NODE BIOPSY Left 06/14/2017   Procedure: LEFT BREAST SEED LOCALIZED LUMPECTOMY (2 SEEDS) AND SENTINEL LYMPH NODE BIOPSY;  Surgeon: Erroll Luna, MD;  Location: [place];  Service: General;  Laterality: Left;  . COLONOSCOPY  2011  . COLONOSCOPY WITH PROPOFOL N/A 12/12/2017   Procedure: COLONOSCOPY WITH PROPOFOL;  Surgeon: Jonathon Bellows, MD;  Location: [facility] ENDOSCOPY;  Service: Gastroenterology;  Laterality: N/A;  . ESOPHAGOGASTRODUODENOSCOPY (EGD) WITH PROPOFOL N/A 12/12/2017   Procedure: ESOPHAGOGASTRODUODENOSCOPY (EGD) WITH PROPOFOL;  Surgeon: Jonathon Bellows, MD;  Location: [facility] ENDOSCOPY;  Service: Gastroenterology;  Laterality: N/A;  . FLEXIBLE SIGMOIDOSCOPY  February 12, 2013   have normal perianal exam, question focal prolapse. 2 small scars in the rectum.  Marland Kitchen HEMORRHOID BANDING  2012   3 times over three months  . SIGMOIDOSCOPY  2013  . TONSILECTOMY/ADENOIDECTOMY WITH MYRINGOTOMY  remote  . UPPER GI ENDOSCOPY  2013    There were no vitals filed for this visit.  Subjective Assessment - 03/08/18 1437    Subjective  I am so tight in my whole Lt upper quadrant.     Pertinent History  Left breast cancer diagnosed 04/14/17 and had lumpectomy 06/14/17 with SLNB (3 nodes). Radiation was completed 08/22/17.  Has been on tamoxifen since January. Otherwise healthy. Did have a right shoulder impingement a year ago or so and says that is a lot better than it was..  Seroma came up in the superior breast and was drained 4 times.     Patient Stated Goals  improve the status of the Lt chest pull and arm movments    Currently in Pain?  No/denies                       [facility] Adult PT Treatment/Exercise - 03/08/18 0001  Exercises   Other Exercises   Seated edge of bed for Rt side bend with Lt UE OH reach incirporating deep breathing      Manual Therapy   Manual Therapy  Myofascial release;Soft tissue mobilization;Scapular mobilization    Soft tissue mobilization  scar work and STM into the Lt pectoralis, breast tissue, and lumpectomy incision.  Pt educated to continue scar massage. in neutral and flexion positions, also in doorway holding end ROM stretches.  release to the Lt latissiums in supine netural and overhead flexion.  Latissiums work in [place]    Myofascial Release  above and below breast in different directions    Scapular Mobilization  In Rt S/L to Lt scapula into retraction and protraction with UE in varying positions    Manual Lymphatic  Drainage (MLD)  --                  PT Long Term Goals - 03/02/18 1740      PT LONG TERM GOAL #1   Title  Pt. will report at least 50% decrease in discomfort and greater ease in reaching left arm forward and up.    Status  On-going      PT LONG TERM GOAL #2   Title  Pt. will be independent with home exercise program for shoulder ROM    Status  On-going      PT LONG TERM GOAL #3   Title  Pt will peform shoulder AROM WNL without increased pulling into the Lt chest wall     Status  On-going      PT LONG TERM GOAL #4   Title  Pt will return to gym activities without limitations     Status  On-going            Plan - 03/08/18 1521    Clinical Impression Statement  Continued with stretching/manual therapy at pts end ROM in varying positions. Also instructed her in stetch into Rt side bend with Lt UE OH and incorporating deep breathing with this. She reports feeling looser at end of session and increased ROM noticed.     Rehab Potential  Excellent    PT Frequency  2x / week    PT Duration  4 weeks    PT Treatment/Interventions  ADLs/Self Care Home Management;Moist Heat;Therapeutic exercise;Patient/family education;Manual techniques;Manual lymph drainage;Compression bandaging;Scar mobilization;Passive range of motion    PT Next Visit Plan  continue Lt shoulder PROM with STM into breast/incision/pect/lat, stretching as tolerated    Consulted and Agree with Plan of Care  Patient       Patient will benefit from skilled therapeutic intervention in order to improve the following deficits and impairments:  Decreased range of motion, Increased fascial restricitons, Pain, Impaired UE functional use  Visit Diagnosis: Stiffness of left shoulder, not elsewhere classified  Aftercare following surgery for neoplasm  Other symptoms and signs involving the musculoskeletal system  Scar condition and fibrosis of skin     Problem List Patient Active Problem List   Diagnosis  Date Noted  . Genetic testing 07/28/2017  . GERD without esophagitis 07/05/2017  . Family history of pancreatic cancer   . Family history of prostate cancer   . Family history of breast cancer   . Aortic atherosclerosis ([place]) 04/28/2017  . Malignant neoplasm of upper-inner quadrant of left breast in female, estrogen receptor positive ([place]) 04/27/2017  . Hepatitis C, chronic ([place]) 09/21/2016  . Health care maintenance 07/29/2015  . Menopausal disorder 01/20/2014  .  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 Rectal pain 05/31/2013  . Hemorrhoids 05/31/2013  . Rectal fissure 11/20/2012  . 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 Bloating 10/03/2012  . Nausea 10/03/2012  . Anal pain 05/09/2012  . Anxiety and depression 04/04/2012  . Diverticulosis 03/21/2012  . HEPATITIS C 01/08/2009  . SHORTNESS OF BREATH 01/08/2009    Otelia Limes, PTA 03/08/2018, 3:42 PM  [place], [place], [address] Phone: [phone]   Fax:  [phone]  Name: SOUA LENK MRN: [id number] Date of Birth: [birth year]

## 2018-03-09 ENCOUNTER — Ambulatory Visit: Admitting: Occupational Therapy

## 2018-03-10 ENCOUNTER — Ambulatory Visit: Admitting: Rehabilitation

## 2018-03-10 ENCOUNTER — Encounter: Payer: Self-pay | Admitting: Rehabilitation

## 2018-03-10 DIAGNOSIS — M25612 Stiffness of left shoulder, not elsewhere classified: Secondary | ICD-10-CM

## 2018-03-10 DIAGNOSIS — R29898 Other symptoms and signs involving the musculoskeletal system: Secondary | ICD-10-CM

## 2018-03-10 DIAGNOSIS — L905 Scar conditions and fibrosis of skin: Secondary | ICD-10-CM

## 2018-03-10 DIAGNOSIS — Z483 Aftercare following surgery for neoplasm: Secondary | ICD-10-CM

## 2018-03-10 NOTE — Therapy (Signed)
[place], [place], [address] Phone: [phone]   Fax:  [phone]  Physical Therapy Treatment  Patient Details  Name: Courtney Romero MRN: [id number] Date of Birth: 09/12/1960 Referring Provider (PT): Dr. Erroll Luna   Encounter Date: 03/10/2018  PT End of Session - 03/10/18 0933    Visit Number  4    Number of Visits  8    Date for PT Re-Evaluation  03/30/18    PT Start Time  0934    PT Stop Time  1013    PT Time Calculation (min)  39 min    Activity Tolerance  Patient tolerated treatment well    Behavior During Therapy  [facility] for tasks assessed/performed       Past Medical History:  Diagnosis Date  . Abnormal CAT scan 2013  . Anxiety   . Cancer ([place])   . Family history of breast cancer   . Family history of pancreatic cancer   . Family history of prostate cancer   . GERD (gastroesophageal reflux disease)   . Hepatitis C 2001   Followed by Dr. Charlean Sanfilippo at [facility]  . History of radiation therapy 07/26/17- 08/22/17   40.05 Gy directed to the left breast in 15 fractions followed by a boost of 10 Gy in 5 fractions.   . Personal history of radiation therapy   . PONV (postoperative nausea and vomiting)     Past Surgical History:  Procedure Laterality Date  . ABDOMINAL HYSTERECTOMY  2009  . breast cyst removal  1995  . BREAST LUMPECTOMY    . BREAST LUMPECTOMY WITH RADIOACTIVE SEED AND SENTINEL LYMPH NODE BIOPSY Left 06/14/2017   Procedure: LEFT BREAST SEED LOCALIZED LUMPECTOMY (2 SEEDS) AND SENTINEL LYMPH NODE BIOPSY;  Surgeon: Erroll Luna, MD;  Location: [place];  Service: General;  Laterality: Left;  . COLONOSCOPY  2011  . COLONOSCOPY WITH PROPOFOL N/A 12/12/2017   Procedure: COLONOSCOPY WITH PROPOFOL;  Surgeon: Jonathon Bellows, MD;  Location: [facility] ENDOSCOPY;  Service: Gastroenterology;  Laterality: N/A;  . ESOPHAGOGASTRODUODENOSCOPY (EGD) WITH PROPOFOL N/A 12/12/2017   Procedure: ESOPHAGOGASTRODUODENOSCOPY (EGD) WITH PROPOFOL;  Surgeon: Jonathon Bellows, MD;  Location: [facility] ENDOSCOPY;  Service: Gastroenterology;  Laterality: N/A;  . FLEXIBLE SIGMOIDOSCOPY  February 12, 2013   have normal perianal exam, question focal prolapse. 2 small scars in the rectum.  Marland Kitchen HEMORRHOID BANDING  2012   3 times over three months  . SIGMOIDOSCOPY  2013  . TONSILECTOMY/ADENOIDECTOMY WITH MYRINGOTOMY  remote  . UPPER GI ENDOSCOPY  2013    There were no vitals filed for this visit.  Subjective Assessment - 03/10/18 0934    Subjective  It just tightens up right away     Pertinent History  Left breast cancer diagnosed 04/14/17 and had lumpectomy 06/14/17 with SLNB (3 nodes). Radiation was completed 08/22/17.  Has been on tamoxifen since January. Otherwise healthy. Did have a right shoulder impingement a year ago or so and says that is a lot better than it was..  Seroma came up in the superior breast and was drained 4 times.     Patient Stated Goals  improve the status of the Lt chest pull and arm movments    Currently in Pain?  No/denies         [facility] PT Assessment - 03/10/18 0001      AROM   Left Shoulder Flexion  163 Degrees    Left Shoulder ABduction  175 Degrees  some tightness into pectoralis                   [facility] Adult PT Treatment/Exercise - 03/10/18 0001      Exercises   Exercises  Shoulder      Shoulder Exercises: Supine   Other Supine Exercises  supine scapular series x 10 each red band       Shoulder Exercises: ROM/Strengthening   Other ROM/Strengthening Exercises  performed band in top of door shoulder flexion pull but with no stretch felt       Shoulder Exercises: [employer] trader  ortho side low row 15#x15, pulldown for stretch and pull 15#x10      Manual Therapy   Soft tissue mobilization  scar work and STM into the [employer], breast tissue, and lumpectomy incision.  Pt educated to continue scar massage. in  neutral and flexion positions, also in doorway holding end ROM stretches.  release to the Lt latissiums in supine netural and overhead flexion.  Latissiums work in [place]  above and below breast in different directions             PT Education - 03/10/18 1001    Education provided  Yes    Education Details  supine scap series red band     Person(s) Educated  Patient    Methods  Explanation;Demonstration;Tactile cues;Verbal cues;Handout    Comprehension  Verbalized understanding;Returned demonstration          PT Long Term Goals - 03/10/18 1011      PT LONG TERM GOAL #1   Title  Pt. will report at least 50% decrease in discomfort and greater ease in reaching left arm forward and up.    Status  On-going      PT LONG TERM GOAL #2   Title  Pt. will be independent with home exercise program for shoulder ROM    Status  On-going      PT LONG TERM GOAL #3   Title  Pt will peform shoulder AROM WNL without increased pulling into the Lt chest wall     Status  On-going      PT LONG TERM GOAL #4   Title  Pt will return to gym activities without limitations     Status  On-going      PT LONG TERM GOAL #5   Title  Pt. will report cording has decreased to the point that it is difficult to palpate or see.    Status  Achieved            Plan - 03/10/18 1001    Clinical Impression Statement  Pt doing well today.  Has improved abduction AROM by 15deg and with no pull into active flexion.  Started strengthening today     PT Frequency  2x / week    PT Duration  4 weeks    PT Treatment/Interventions  ADLs/Self Care Home Management;Moist Heat;Therapeutic exercise;Patient/family education;Manual techniques;Manual lymph drainage;Compression bandaging;Scar mobilization;Passive range of motion    PT Next Visit Plan  continue Lt shoulder PROM with STM into breast/incision/pect/lat, stretching as tolerated, start strengthening     PT Home Exercise Plan  updated  stretches to: turning point handout; open book elbows bent, single arm doorway, XYW, supine scap series    Consulted and Agree with Plan of Care  Patient       Patient will benefit from skilled therapeutic intervention in order  to improve the following deficits and impairments:  Decreased range of motion, Increased fascial restricitons, Pain, Impaired UE functional use  Visit Diagnosis: Stiffness of left shoulder, not elsewhere classified  Aftercare following surgery for neoplasm  Other symptoms and signs involving the musculoskeletal system  Scar condition and fibrosis of skin     Problem List Patient Active Problem List   Diagnosis Date Noted  . Genetic testing 07/28/2017  . GERD without esophagitis 07/05/2017  . Family history of pancreatic cancer   . Family history of prostate cancer   . Family history of breast cancer   . Aortic atherosclerosis ([place]) 04/28/2017  . Malignant neoplasm of upper-inner quadrant of left breast in female, estrogen receptor positive ([place]) 04/27/2017  . Hepatitis C, chronic ([place]) 09/21/2016  . Health care maintenance 07/29/2015  . Menopausal disorder 01/20/2014  . 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 Rectal pain 05/31/2013  . Hemorrhoids 05/31/2013  . Rectal fissure 11/20/2012  . 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 Bloating 10/03/2012  . Nausea 10/03/2012  . Anal pain 05/09/2012  . Anxiety and depression 04/04/2012  . Diverticulosis 03/21/2012  . HEPATITIS C 01/08/2009  . SHORTNESS OF BREATH 01/08/2009    Shan Levans, PT 03/10/2018, 10:14 AM  [place], [place], [address] Phone: [phone]   Fax:  [phone]  Name: Courtney Romero MRN: [id number] Date of Birth: [birth year]

## 2018-03-10 NOTE — Patient Instructions (Signed)

## 2018-03-13 ENCOUNTER — Ambulatory Visit

## 2018-03-13 DIAGNOSIS — M25612 Stiffness of left shoulder, not elsewhere classified: Secondary | ICD-10-CM | POA: Diagnosis not present

## 2018-03-13 DIAGNOSIS — Z483 Aftercare following surgery for neoplasm: Secondary | ICD-10-CM

## 2018-03-13 DIAGNOSIS — L905 Scar conditions and fibrosis of skin: Secondary | ICD-10-CM

## 2018-03-13 DIAGNOSIS — R29898 Other symptoms and signs involving the musculoskeletal system: Secondary | ICD-10-CM

## 2018-03-13 NOTE — Therapy (Signed)
[place], [place], [address] Phone: [phone]   Fax:  [phone]  Physical Therapy Treatment  Patient Details  Name: Courtney Romero MRN: [id number] Date of Birth: Aug 31, 1960 Referring Provider (PT): Dr. Erroll Luna   Encounter Date: 03/13/2018  PT End of Session - 03/13/18 1107    Visit Number  5    Number of Visits  8    Date for PT Re-Evaluation  03/30/18    PT Start Time  4196    PT Stop Time  1105    PT Time Calculation (min)  42 min    Activity Tolerance  Patient tolerated treatment well    Behavior During Therapy  [facility] for tasks assessed/performed       Past Medical History:  Diagnosis Date  . Abnormal CAT scan 2013  . Anxiety   . Cancer ([place])   . Family history of breast cancer   . Family history of pancreatic cancer   . Family history of prostate cancer   . GERD (gastroesophageal reflux disease)   . Hepatitis C 2001   Followed by Dr. Charlean Sanfilippo at [facility]  . History of radiation therapy 07/26/17- 08/22/17   40.05 Gy directed to the left breast in 15 fractions followed by a boost of 10 Gy in 5 fractions.   . Personal history of radiation therapy   . PONV (postoperative nausea and vomiting)     Past Surgical History:  Procedure Laterality Date  . ABDOMINAL HYSTERECTOMY  2009  . breast cyst removal  1995  . BREAST LUMPECTOMY    . BREAST LUMPECTOMY WITH RADIOACTIVE SEED AND SENTINEL LYMPH NODE BIOPSY Left 06/14/2017   Procedure: LEFT BREAST SEED LOCALIZED LUMPECTOMY (2 SEEDS) AND SENTINEL LYMPH NODE BIOPSY;  Surgeon: Erroll Luna, MD;  Location: [place];  Service: General;  Laterality: Left;  . COLONOSCOPY  2011  . COLONOSCOPY WITH PROPOFOL N/A 12/12/2017   Procedure: COLONOSCOPY WITH PROPOFOL;  Surgeon: Jonathon Bellows, MD;  Location: [facility] ENDOSCOPY;  Service: Gastroenterology;  Laterality: N/A;  . ESOPHAGOGASTRODUODENOSCOPY (EGD) WITH PROPOFOL N/A 12/12/2017    Procedure: ESOPHAGOGASTRODUODENOSCOPY (EGD) WITH PROPOFOL;  Surgeon: Jonathon Bellows, MD;  Location: [facility] ENDOSCOPY;  Service: Gastroenterology;  Laterality: N/A;  . FLEXIBLE SIGMOIDOSCOPY  February 12, 2013   have normal perianal exam, question focal prolapse. 2 small scars in the rectum.  Marland Kitchen HEMORRHOID BANDING  2012   3 times over three months  . SIGMOIDOSCOPY  2013  . TONSILECTOMY/ADENOIDECTOMY WITH MYRINGOTOMY  remote  . UPPER GI ENDOSCOPY  2013    There were no vitals filed for this visit.  Subjective Assessment - 03/13/18 1025    Subjective  I'm doing the theraband exercises and stretching daily, throughout the day and the tightness is better after I stretch but it never stays like that. It always tightnes back up again.     Pertinent History  Left breast cancer diagnosed 04/14/17 and had lumpectomy 06/14/17 with SLNB (3 nodes). Radiation was completed 08/22/17.  Has been on tamoxifen since January. Otherwise healthy. Did have a right shoulder impingement a year ago or so and says that is a lot better than it was..  Seroma came up in the superior breast and was drained 4 times.     Patient Stated Goals  improve the status of the Lt chest pull and arm movments    Currently in Pain?  No/denies  [facility] Adult PT Treatment/Exercise - 03/13/18 0001      Manual Therapy   Manual Therapy  Myofascial release;Scapular mobilization;Taping    Soft tissue mobilization  --    Myofascial Release  above and below breast in different directions; then into Rt S/L with Lt UE OH for myofsacial release to Lt lateral trunk with varying hand placements    Scapular Mobilization  In Rt S/L to Lt scapular into protraction and retraction    Kinesiotex  Edema      Kinesiotix   Edema  Skincote applied then 1 large strip along Lt lateral trunk with arm OH and pt in Rt S/L to facilitate space to see if this will help decrease symptoms of cording                  PT Long  Term Goals - 03/10/18 1011      PT LONG TERM GOAL #1   Title  Pt. will report at least 50% decrease in discomfort and greater ease in reaching left arm forward and up.    Status  On-going      PT LONG TERM GOAL #2   Title  Pt. will be independent with home exercise program for shoulder ROM    Status  On-going      PT LONG TERM GOAL #3   Title  Pt will peform shoulder AROM WNL without increased pulling into the Lt chest wall     Status  On-going      PT LONG TERM GOAL #4   Title  Pt will return to gym activities without limitations     Status  On-going      PT LONG TERM GOAL #5   Title  Pt. will report cording has decreased to the point that it is difficult to palpate or see.    Status  Achieved            Plan - 03/13/18 1107    Clinical Impression Statement  Continued with manual therapy focusing on myofascial release to Lt lateral trunk where pt c/o cording symptoms/tightness, mostly worked with pt in Rt S/L. Also trial of kinesiotape along Lt lateral trunk where pt c/o cording. Pt reported tightness much improved at end of session.     Rehab Potential  Excellent    PT Frequency  2x / week    PT Duration  4 weeks    PT Treatment/Interventions  ADLs/Self Care Home Management;Moist Heat;Therapeutic exercise;Patient/family education;Manual techniques;Manual lymph drainage;Compression bandaging;Scar mobilization;Passive range of motion    PT Next Visit Plan  How was taping? Cont this?; continue Lt shoulder PROM with STM into breast/incision/pect/lat, stretching as tolerated, start strengthening     Consulted and Agree with Plan of Care  Patient       Patient will benefit from skilled therapeutic intervention in order to improve the following deficits and impairments:  Decreased range of motion, Increased fascial restricitons, Pain, Impaired UE functional use  Visit Diagnosis: Stiffness of left shoulder, not elsewhere classified  Aftercare following surgery for  neoplasm  Other symptoms and signs involving the musculoskeletal system  Scar condition and fibrosis of skin     Problem List Patient Active Problem List   Diagnosis Date Noted  . Genetic testing 07/28/2017  . GERD without esophagitis 07/05/2017  . Family history of pancreatic cancer   . Family history of prostate cancer   . Family history of breast cancer   . Aortic atherosclerosis ([place]) 04/28/2017  . Malignant  neoplasm of upper-inner quadrant of left breast in female, estrogen receptor positive ([place]) 04/27/2017  . Hepatitis C, chronic ([place]) 09/21/2016  . Health care maintenance 07/29/2015  . Menopausal disorder 01/20/2014  . 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 Rectal pain 05/31/2013  . Hemorrhoids 05/31/2013  . Rectal fissure 11/20/2012  . 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 Bloating 10/03/2012  . Nausea 10/03/2012  . Anal pain 05/09/2012  . Anxiety and depression 04/04/2012  . Diverticulosis 03/21/2012  . HEPATITIS C 01/08/2009  . SHORTNESS OF BREATH 01/08/2009    Otelia Limes, PTA 03/13/2018, 11:43 AM  [place], [place], [address] Phone: [phone]   Fax:  [phone]  Name: Courtney Romero MRN: [id number] Date of Birth: 12/19/1960

## 2018-03-15 ENCOUNTER — Ambulatory Visit: Admitting: Gastroenterology

## 2018-03-20 ENCOUNTER — Ambulatory Visit: Admitting: Rehabilitation

## 2018-03-22 ENCOUNTER — Encounter

## 2018-03-24 ENCOUNTER — Encounter: Payer: Self-pay | Admitting: Physical Therapy

## 2018-03-24 ENCOUNTER — Ambulatory Visit: Attending: Surgery | Admitting: Physical Therapy

## 2018-03-24 ENCOUNTER — Encounter

## 2018-03-24 DIAGNOSIS — M25612 Stiffness of left shoulder, not elsewhere classified: Secondary | ICD-10-CM | POA: Diagnosis present

## 2018-03-24 DIAGNOSIS — Z483 Aftercare following surgery for neoplasm: Secondary | ICD-10-CM | POA: Diagnosis present

## 2018-03-24 DIAGNOSIS — L905 Scar conditions and fibrosis of skin: Secondary | ICD-10-CM | POA: Diagnosis present

## 2018-03-24 DIAGNOSIS — R29898 Other symptoms and signs involving the musculoskeletal system: Secondary | ICD-10-CM

## 2018-03-24 NOTE — Patient Instructions (Signed)
First of all, check with your insurance company to see if provider is in network    [facility] (for wigs and compression sleeves / gloves/gauntlets )  [address] [place], [place] [address] [phone]  Will file some insurances --- call for appointment   [facility] (for mastectomy prosthetics and garments) [address] [place], [place] [address] [phone] Will file some insurances --- call for appointment  [facility]  [address]  [place], [place] [address] [phone] Lower extremity garments  [facility] [address] [place], [place]  [address] [phone]  Cathy Rubel ( Medicaid certified lymphedema fitter) [phone] [email or web address]  Melissa Meares  [facility]  [phone]  [facility] [address] [place], [place] [address] [phone]  Other Resources: National Lymphedema Network:  www.lymphnet.org www.Klosetraining.com for patient articles and self manual lymph drainage information www.lymphedemablog.com has informative articles.  www.compressionguru.com www.lymphedemaproducts.com www.brightlifedirect.com www.compressionguru.com 

## 2018-03-24 NOTE — Therapy (Signed)
[place], [place], [address] Phone: [phone]   Fax:  [phone]  Physical Therapy Treatment  Patient Details  Name: Courtney Romero MRN: [id number] Date of Birth: [birth year] Referring Provider (PT): Dr. Erroll Luna   Encounter Date: 03/24/2018  PT End of Session - 03/24/18 1232    Visit Number  6    Number of Visits  8    Date for PT Re-Evaluation  03/30/18    PT Start Time  1448    PT Stop Time  1100    PT Time Calculation (min)  45 min    Activity Tolerance  Patient tolerated treatment well    Behavior During Therapy  [facility] for tasks assessed/performed       Past Medical History:  Diagnosis Date  . Abnormal CAT scan 2013  . Anxiety   . Cancer ([place])   . Family history of breast cancer   . Family history of pancreatic cancer   . Family history of prostate cancer   . GERD (gastroesophageal reflux disease)   . Hepatitis C 2001   Followed by Dr. Charlean Sanfilippo at [facility]  . History of radiation therapy 07/26/17- 08/22/17   40.05 Gy directed to the left breast in 15 fractions followed by a boost of 10 Gy in 5 fractions.   . Personal history of radiation therapy   . PONV (postoperative nausea and vomiting)     Past Surgical History:  Procedure Laterality Date  . ABDOMINAL HYSTERECTOMY  2009  . breast cyst removal  1995  . BREAST LUMPECTOMY    . BREAST LUMPECTOMY WITH RADIOACTIVE SEED AND SENTINEL LYMPH NODE BIOPSY Left 06/14/2017   Procedure: LEFT BREAST SEED LOCALIZED LUMPECTOMY (2 SEEDS) AND SENTINEL LYMPH NODE BIOPSY;  Surgeon: Erroll Luna, MD;  Location: [place];  Service: General;  Laterality: Left;  . COLONOSCOPY  2011  . COLONOSCOPY WITH PROPOFOL N/A 12/12/2017   Procedure: COLONOSCOPY WITH PROPOFOL;  Surgeon: Jonathon Bellows, MD;  Location: [facility] ENDOSCOPY;  Service: Gastroenterology;  Laterality: N/A;  . ESOPHAGOGASTRODUODENOSCOPY (EGD) WITH PROPOFOL N/A 12/12/2017   Procedure: ESOPHAGOGASTRODUODENOSCOPY (EGD) WITH PROPOFOL;  Surgeon: Jonathon Bellows, MD;  Location: [facility] ENDOSCOPY;  Service: Gastroenterology;  Laterality: N/A;  . FLEXIBLE SIGMOIDOSCOPY  February 12, 2013   have normal perianal exam, question focal prolapse. 2 small scars in the rectum.  Marland Kitchen HEMORRHOID BANDING  2012   3 times over three months  . SIGMOIDOSCOPY  2013  . TONSILECTOMY/ADENOIDECTOMY WITH MYRINGOTOMY  remote  . UPPER GI ENDOSCOPY  2013    There were no vitals filed for this visit.  Subjective Assessment - 03/24/18 1218    Subjective  Pt reports she got great relief from the kinesiotape last session and no longer has the pain from cording down her side     Pertinent History  Left breast cancer diagnosed 04/14/17 and had lumpectomy 06/14/17 with SLNB (3 nodes). Radiation was completed 08/22/17.  Has been on tamoxifen since January. Otherwise healthy. Did have a right shoulder impingement a year ago or so and says that is a lot better than it was..  Seroma came up in the superior breast and was drained 4 times.     Patient Stated Goals  improve the status of the Lt chest pull and arm movments    Currently in Pain?  No/denies  [facility] Adult PT Treatment/Exercise - 03/24/18 0001      Exercises   Exercises  Shoulder      Shoulder Exercises: Supine   Diagonals  AROM    Diagonals Limitations  will manual assist at end range for extra stretch     Other Supine Exercises  vertically on foam roller for unilateral and alternating shoulder flexion, marches and dead bug exercises       Shoulder Exercises: Sidelying   ABduction  AROM;Left    Other Sidelying Exercises  open book exercise for anterior chest stretch       Manual Therapy   Manual Therapy  Myofascial release;Scapular mobilization    Manual therapy comments  gave pt chip pack and piece of small dotted foam to wear in compression bra to see if she can get some decongestion of breast especially  around scar     Myofascial Release  to left breast around scar     Kinesiotex  --   could not do taping as we are out of skincote             PT Education - 03/24/18 1229    Education provided  Yes    Education Details  gave pt information about where to get kinesiotape and skincote           PT Long Term Goals - 03/10/18 1011      PT LONG TERM GOAL #1   Title  Pt. will report at least 50% decrease in discomfort and greater ease in reaching left arm forward and up.    Status  On-going      PT LONG TERM GOAL #2   Title  Pt. will be independent with home exercise program for shoulder ROM    Status  On-going      PT LONG TERM GOAL #3   Title  Pt will peform shoulder AROM WNL without increased pulling into the Lt chest wall     Status  On-going      PT LONG TERM GOAL #4   Title  Pt will return to gym activities without limitations     Status  On-going      PT LONG TERM GOAL #5   Title  Pt. will report cording has decreased to the point that it is difficult to palpate or see.    Status  Achieved            Plan - 03/24/18 1232    Clinical Impression Statement  Pt reports she no longer has cording in trunk. some skin dimpling in arm but no pain,  She still as fullness in left breast with contraction at scar.  Added foam chips for trial in sports bra today     PT Frequency  2x / week    PT Duration  4 weeks    PT Treatment/Interventions  ADLs/Self Care Home Management;Moist Heat;Therapeutic exercise;Patient/family education;Manual techniques;Manual lymph drainage;Compression bandaging;Scar mobilization;Passive range of motion    PT Next Visit Plan  How was taping? foam chips? Cont this?; continue Lt shoulder PROM with STM into breast/incision/pect/lat, stretching as tolerated, start strengthening        Patient will benefit from skilled therapeutic intervention in order to improve the following deficits and impairments:  Decreased range of motion, Increased  fascial restricitons, Pain, Impaired UE functional use  Visit Diagnosis: Stiffness of left shoulder, not elsewhere classified  Aftercare following surgery for neoplasm  Other symptoms and signs involving the musculoskeletal system  Scar  condition and fibrosis of skin     Problem List Patient Active Problem List   Diagnosis Date Noted  . Genetic testing 07/28/2017  . GERD without esophagitis 07/05/2017  . Family history of pancreatic cancer   . Family history of prostate cancer   . Family history of breast cancer   . Aortic atherosclerosis ([place]) 04/28/2017  . Malignant neoplasm of upper-inner quadrant of left breast in female, estrogen receptor positive ([place]) 04/27/2017  . Hepatitis C, chronic ([place]) 09/21/2016  . Health care maintenance 07/29/2015  . Menopausal disorder 01/20/2014  . 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 Rectal pain 05/31/2013  . Hemorrhoids 05/31/2013  . Rectal fissure 11/20/2012  . 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 Bloating 10/03/2012  . Nausea 10/03/2012  . Anal pain 05/09/2012  . Anxiety and depression 04/04/2012  . Diverticulosis 03/21/2012  . HEPATITIS C 01/08/2009  . SHORTNESS OF BREATH 01/08/2009   Donato Heinz. Owens Shark PT  Norwood Levo 03/24/2018, 12:34 PM  [place], [place], [address] Phone: [phone]   Fax:  [phone]  Name: THURSA EMME MRN: [id number] Date of Birth: 1960/12/27

## 2018-03-27 ENCOUNTER — Other Ambulatory Visit

## 2018-03-27 ENCOUNTER — Encounter: Payer: Self-pay | Admitting: Rehabilitation

## 2018-03-27 ENCOUNTER — Encounter

## 2018-03-27 ENCOUNTER — Ambulatory Visit: Admitting: Rehabilitation

## 2018-03-27 DIAGNOSIS — Z483 Aftercare following surgery for neoplasm: Secondary | ICD-10-CM

## 2018-03-27 DIAGNOSIS — R29898 Other symptoms and signs involving the musculoskeletal system: Secondary | ICD-10-CM

## 2018-03-27 DIAGNOSIS — L905 Scar conditions and fibrosis of skin: Secondary | ICD-10-CM

## 2018-03-27 DIAGNOSIS — M25612 Stiffness of left shoulder, not elsewhere classified: Secondary | ICD-10-CM

## 2018-03-27 NOTE — Therapy (Signed)
[place], [place], [address] Phone: [phone]   Fax:  [phone]  Physical Therapy Treatment  Patient Details  Name: Courtney Romero MRN: [id number] Date of Birth: 08/04/1960 Referring Provider (PT): Dr. Erroll Luna   Encounter Date: 03/27/2018  PT End of Session - 03/27/18 1021    Visit Number  7    Number of Visits  8    Date for PT Re-Evaluation  03/30/18    PT Start Time  1021    PT Stop Time  1100    PT Time Calculation (min)  39 min    Activity Tolerance  Patient tolerated treatment well    Behavior During Therapy  [facility] for tasks assessed/performed       Past Medical History:  Diagnosis Date  . Abnormal CAT scan 2013  . Anxiety   . Cancer ([place])   . Family history of breast cancer   . Family history of pancreatic cancer   . Family history of prostate cancer   . GERD (gastroesophageal reflux disease)   . Hepatitis C 2001   Followed by Dr. Charlean Sanfilipp[facility] at [facility]  . History of radiation therapy 07/26/17- 08/22/17   40.05 Gy directed to the left breast in 15 fractions followed by a boost of 10 Gy in 5 fractions.   . Personal history of radiation therapy   . PONV (postoperative nausea and vomiting)     Past Surgical History:  Procedure Laterality Date  . ABDOMINAL HYSTERECTOMY  2009  . breast cyst removal  1995  . BREAST LUMPECTOMY    . BREAST LUMPECTOMY WITH RADIOACTIVE SEED AND SENTINEL LYMPH NODE BIOPSY Left 06/14/2017   Procedure: LEFT BREAST SEED LOCALIZED LUMPECTOMY (2 SEEDS) AND SENTINEL LYMPH NODE BIOPSY;  Surgeon: Erroll Luna, MD;  Location: [place];  Service: General;  Laterality: Left;  . COLONOSCOPY  2011  . COLONOSCOPY WITH PROPOFOL N/A 12/12/2017   Procedure: COLONOSCOPY WITH PROPOFOL;  Surgeon: Jonathon Bellows, MD;  Location[facility] ENDOSCOPY;  Service: Gastroenterology;  Laterality: N/A;  . ESOPHAGOGASTRODUODENOSCOPY (EGD) WITH PROPOFOL N/A 12/12/2017   Procedure: ESOPHAGOGASTRODUODENOSCOPY (EGD) WITH PROPOFOL;  Surgeon: Jonathon Bellows, MD;  Location: [facility] ENDOSCOPY;  Service: Gastroenterology;  Laterality: N/A;  . FLEXIBLE SIGMOIDOSCOPY  February 12, 2013   have normal perianal exam, question focal prolapse. 2 small scars in the rectum.  Marland Kitchen HEMORRHOID BANDING  2012   3 times over three months  . SIGMOIDOSCOPY  2013  . TONSILECTOMY/ADENOIDECTOMY WITH MYRINGOTOMY  remote  . UPPER GI ENDOSCOPY  2013    There were no vitals filed for this visit.  Subjective Assessment - 03/27/18 1018    Subjective  Still doing well.  Feels like the cording is no longer there.  I still want to work on the pulling . I tried to do a small push up and it was a little easier     Pertinent History  Left breast cancer diagnosed 04/14/17 and had lumpectomy 06/14/17 with SLNB (3 nodes). Radiation was completed 08/22/17.  Has been on tamoxifen since January. Otherwise healthy. Did have a right shoulder impingement a year ago or so and says that is a lot better than it was..  Seroma came up in the superior breast and was drained 4 times.     Patient Stated Goals  improve the status of the Lt chest pull and arm movments    Currently in Pain?  No/denies  [facility] Adult PT Treatment/Exercise - 03/27/18 0001      Exercises   Exercises  Shoulder      Shoulder Exercises: Supine   Other Supine Exercises  on FR lengthwise: bil overhead flexion x 10 2#, alternating flexion 2# x 10 each, bil protraction/retraction 2# x 10, Lt diaganols 2# x 10       Shoulder Exercises: Prone   Extension  Left;10 reps    Extension Weight (lbs)  2    Horizontal ABduction 1  10 reps    Horizontal ABduction 1 Weight (lbs)  2      Shoulder Exercises: Sidelying   Other Sidelying Exercises  open book exercise for anterior chest stretch , abduction 2# x 10      Manual Therapy   Manual Therapy  Passive ROM    Myofascial Release  to left breast around scar, and medial  and superior breast with PROM     Passive ROM  to Lt shoulder in supine to tolerance with STM into breast and pectoralis as tolerated                   PT Long Term Goals - 03/27/18 1044      PT LONG TERM GOAL #1   Title  Pt. will report at least 50% decrease in discomfort and greater ease in reaching left arm forward and up.    Status  On-going      PT LONG TERM GOAL #2   Title  Pt. will be independent with home exercise program for shoulder ROM    Status  On-going      PT LONG TERM GOAL #3   Title  Pt will peform shoulder AROM WNL without increased pulling into the Lt chest wall     Status  On-going      PT LONG TERM GOAL #4   Title  Pt will return to gym activities without limitations     Status  On-going      PT LONG TERM GOAL #5   Title  Pt. will report cording has decreased to the point that it is difficult to palpate or see.    Status  Achieved            Plan - 03/27/18 1041    Clinical Impression Statement  Pt reports no side body cording still after the taping.  Continues to experience tightness into the Lt breast at end range but reporting no significant limitations overall.  We will continue until the end of the year     PT Frequency  2x / week    PT Duration  4 weeks    PT Treatment/Interventions  ADLs/Self Care Home Management;Moist Heat;Therapeutic exercise;Patient/family education;Manual techniques;Manual lymph drainage;Compression bandaging;Scar mobilization;Passive range of motion    PT Next Visit Plan  ROM and visit renenwal continue Lt shoulder PROM with STM into breast/incision/pect/lat, stretching as tolerated, strengthening, need any more taping    PT Home Exercise Plan  updated stretches to: turning point handout; open book elbows bent, single arm doorway, XYW, supine scap series       Patient will benefit from skilled therapeutic intervention in order to improve the following deficits and impairments:  Decreased range of motion,  Increased fascial restricitons, Pain, Impaired UE functional use  Visit Diagnosis: Stiffness of left shoulder, not elsewhere classified  Aftercare following surgery for neoplasm  Other symptoms and signs involving the musculoskeletal system  Scar condition and fibrosis of skin  Problem List Patient Active Problem List   Diagnosis Date Noted  . Genetic testing 07/28/2017  . GERD without esophagitis 07/05/2017  . Family history of pancreatic cancer   . Family history of prostate cancer   . Family history of breast cancer   . Aortic atherosclerosis ([place]) 04/28/2017  . Malignant neoplasm of upper-inner quadrant of left breast in female, estrogen receptor positive ([place]) 04/27/2017  . Hepatitis C, chronic ([place]) 09/21/2016  . Health care maintenance 07/29/2015  . Menopausal disorder 01/20/2014  . 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 Rectal pain 05/31/2013  . Hemorrhoids 05/31/2013  . Rectal fissure 11/20/2012  . 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 Bloating 10/03/2012  . Nausea 10/03/2012  . Anal pain 05/09/2012  . Anxiety and depression 04/04/2012  . Diverticulosis 03/21/2012  . HEPATITIS C 01/08/2009  . SHORTNESS OF BREATH 01/08/2009    Shan Levans, PT 03/27/2018, 11:01 AM  [place], [place], [address] Phone: [phone]   Fax:  [phone]  Name: Courtney Romero MRN: [id number] Date of Birth: 1960/05/25

## 2018-03-29 ENCOUNTER — Encounter: Payer: Self-pay | Admitting: Rehabilitation

## 2018-03-29 ENCOUNTER — Ambulatory Visit: Admitting: Rehabilitation

## 2018-03-29 DIAGNOSIS — M25612 Stiffness of left shoulder, not elsewhere classified: Secondary | ICD-10-CM

## 2018-03-29 DIAGNOSIS — Z483 Aftercare following surgery for neoplasm: Secondary | ICD-10-CM

## 2018-03-29 DIAGNOSIS — R29898 Other symptoms and signs involving the musculoskeletal system: Secondary | ICD-10-CM

## 2018-03-29 DIAGNOSIS — L905 Scar conditions and fibrosis of skin: Secondary | ICD-10-CM

## 2018-03-29 NOTE — Therapy (Signed)
[place], [place], [address] Phone: [phone]   Fax:  [phone]  Physical Therapy Treatment  Patient Details  Name: Courtney Romero MRN: [id number] Date of Birth: 1961-01-27 Referring Provider (PT): Dr. Erroll Luna   Encounter Date: 03/29/2018  PT End of Session - 03/29/18 1136    Visit Number  8    Number of Visits  12    Date for PT Re-Evaluation  04/18/18    PT Start Time  1100    PT Stop Time  1145    PT Time Calculation (min)  45 min    Activity Tolerance  Patient tolerated treatment well    Behavior During Therapy  [facility] for tasks assessed/performed       Past Medical History:  Diagnosis Date  . Abnormal CAT scan 2013  . Anxiety   . Cancer ([place])   . Family history of breast cancer   . Family history of pancreatic cancer   . Family history of prostate cancer   . GERD (gastroesophageal reflux disease)   . Hepatitis C 2001   Followed by Dr. Charlean Sanfilippo at [facility]  . History of radiation therapy 07/26/17- 08/22/17   40.05 Gy directed to the left breast in 15 fractions followed by a boost of 10 Gy in 5 fractions.   . Personal history of radiation therapy   . PONV (postoperative nausea and vomiting)     Past Surgical History:  Procedure Laterality Date  . ABDOMINAL HYSTERECTOMY  2009  . breast cyst removal  1995  . BREAST LUMPECTOMY    . BREAST LUMPECTOMY WITH RADIOACTIVE SEED AND SENTINEL LYMPH NODE BIOPSY Left 06/14/2017   Procedure: LEFT BREAST SEED LOCALIZED LUMPECTOMY (2 SEEDS) AND SENTINEL LYMPH NODE BIOPSY;  Surgeon: Erroll Luna, MD;  Location: [place];  Service: General;  Laterality: Left;  . COLONOSCOPY  2011  . COLONOSCOPY WITH PROPOFOL N/A 12/12/2017   Procedure: COLONOSCOPY WITH PROPOFOL;  Surgeon: Jonathon Bellows, MD;  Location: [facility] ENDOSCOPY;  Service: Gastroenterology;  Laterality: N/A;  . ESOPHAGOGASTRODUODENOSCOPY (EGD) WITH PROPOFOL N/A 12/12/2017   Procedure: ESOPHAGOGASTRODUODENOSCOPY (EGD) WITH PROPOFOL;  Surgeon: Jonathon Bellows, MD;  Location: [facility] ENDOSCOPY;  Service: Gastroenterology;  Laterality: N/A;  . FLEXIBLE SIGMOIDOSCOPY  February 12, 2013   have normal perianal exam, question focal prolapse. 2 small scars in the rectum.  Marland Kitchen HEMORRHOID BANDING  2012   3 times over three months  . SIGMOIDOSCOPY  2013  . TONSILECTOMY/ADENOIDECTOMY WITH MYRINGOTOMY  remote  . UPPER GI ENDOSCOPY  2013    There were no vitals filed for this visit.  Subjective Assessment - 03/29/18 1054    Subjective  Doing well.  I did a half a pushup.  Took a kettlebell class and did another class through the [facility] last night with arm weights  (Pended)     Pertinent History  Left breast cancer diagnosed 04/14/17 and had lumpectomy 06/14/17 with SLNB (3 nodes). Radiation was completed 08/22/17.  Has been on tamoxifen since January. Otherwise healthy. Did have a right shoulder impingement a year ago or so and says that is a lot better than it was..  Seroma came up in the superior breast and was drained 4 times.   Roni Bread)                        [facility] Adult PT Treatment/Exercise - 03/29/18 0001      Shoulder Exercises: Supine  Other Supine Exercises  on FR lengthwise: bil overhead flexion x 15 2#, alternating flexion 2# x 15 each, bil protraction/retraction 2# x 15, Lt diaganols 2# x 15, pectoralis stretch x 90seconds      Manual Therapy   Soft tissue mobilization  to pectoralis, medial and lateral breast tissue and to the scar at the nipple     Myofascial Release  to left breast around scar, and medial and superior breast with PROM     Passive ROM  to Lt shoulder in supine to tolerance with STM into breast and pectoralis as tolerated                   PT Long Term Goals - 03/29/18 1141      PT LONG TERM GOAL #1   Title  Pt. will report at least 50% decrease in discomfort and greater ease in reaching left arm forward and up.    Status   Achieved      PT LONG TERM GOAL #2   Title  Pt. will be independent with home exercise program for shoulder ROM    Status  On-going      PT LONG TERM GOAL #3   Title  Pt will peform shoulder AROM WNL without increased pulling into the Lt chest wall     Status  Achieved      PT LONG TERM GOAL #4   Title  Pt will return to gym activities without limitations     Status  Partially Met      PT LONG TERM GOAL #5   Title  Pt. will report cording has decreased to the point that it is difficult to palpate or see.    Status  Achieved            Plan - 03/29/18 1137    Clinical Impression Statement  Pt doing very well today.  Still with tenderness into the Rt breast at the pectoralis and into the lateral breast.  Almost full ROM with stretches.  Pt is so ind at the gym that PT will focus on manual work, stretches, and soft tissue work     PT Frequency  2x / week    PT Duration  4 weeks    PT Treatment/Interventions  ADLs/Self Care Home Management;Moist Heat;Therapeutic exercise;Patient/family education;Manual techniques;Manual lymph drainage;Compression bandaging;Scar mobilization;Passive range of motion    PT Next Visit Plan  ROM and visit renenwal continue Lt shoulder PROM with STM into breast/incision/pect/lat, stretching as tolerated, strengthening, need any more taping       Patient will benefit from skilled therapeutic intervention in order to improve the following deficits and impairments:  Decreased range of motion, Increased fascial restricitons, Pain, Impaired UE functional use  Visit Diagnosis: Stiffness of left shoulder, not elsewhere classified  Aftercare following surgery for neoplasm  Other symptoms and signs involving the musculoskeletal system  Scar condition and fibrosis of skin     Problem List Patient Active Problem List   Diagnosis Date Noted  . Genetic testing 07/28/2017  . GERD without esophagitis 07/05/2017  . Family history of pancreatic cancer   .  Family history of prostate cancer   . Family history of breast cancer   . Aortic atherosclerosis ([place]) 04/28/2017  . Malignant neoplasm of upper-inner quadrant of left breast in female, estrogen receptor positive ([place]) 04/27/2017  . Hepatitis C, chronic ([place]) 09/21/2016  . Health care maintenance 07/29/2015  . Menopausal disorder 01/20/2014  . 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  Rectal pain 05/31/2013  . Hemorrhoids 05/31/2013  . Rectal fissure 11/20/2012  . 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 Bloating 10/03/2012  . Nausea 10/03/2012  . Anal pain 05/09/2012  . Anxiety and depression 04/04/2012  . Diverticulosis 03/21/2012  . HEPATITIS C 01/08/2009  . SHORTNESS OF BREATH 01/08/2009    Shan Levans, PT 03/29/2018, 12:04 PM  [place], [place], [address] Phone: [phone]   Fax:  [phone]  Name: Courtney Romero MRN: [id number] Date of Birth: 12-Nov-1960

## 2018-04-03 ENCOUNTER — Ambulatory Visit
Admission: RE | Admit: 2018-04-03 | Discharge: 2018-04-03 | Disposition: A | Discharge status: Home / Self Care | Source: Ambulatory Visit | Attending: Oncology | Admitting: Oncology

## 2018-04-03 ENCOUNTER — Ambulatory Visit
Admission: RE | Admit: 2018-04-03 | Discharge: 2018-04-03 | Disposition: A | Discharge status: Home / Self Care | Source: Ambulatory Visit | Attending: Adult Health | Admitting: Adult Health

## 2018-04-03 DIAGNOSIS — Z170 Estrogen receptor positive status [ER+]: Secondary | ICD-10-CM

## 2018-04-03 DIAGNOSIS — C50212 Malignant neoplasm of upper-inner quadrant of left female breast: Secondary | ICD-10-CM

## 2018-04-03 DIAGNOSIS — C50412 Malignant neoplasm of upper-outer quadrant of left female breast: Secondary | ICD-10-CM

## 2018-04-05 ENCOUNTER — Encounter: Payer: Self-pay | Admitting: Physical Therapy

## 2018-04-05 ENCOUNTER — Ambulatory Visit: Admitting: Physical Therapy

## 2018-04-05 DIAGNOSIS — M25612 Stiffness of left shoulder, not elsewhere classified: Secondary | ICD-10-CM | POA: Diagnosis not present

## 2018-04-05 DIAGNOSIS — Z483 Aftercare following surgery for neoplasm: Secondary | ICD-10-CM

## 2018-04-05 DIAGNOSIS — L905 Scar conditions and fibrosis of skin: Secondary | ICD-10-CM

## 2018-04-05 DIAGNOSIS — R29898 Other symptoms and signs involving the musculoskeletal system: Secondary | ICD-10-CM

## 2018-04-05 NOTE — Therapy (Signed)
Courtney Romero, [place], [address] Phone: [phone]   Fax:  [phone]  Physical Therapy Treatment  Patient Details  Name: BATYA CITRON MRN: [id number] Date of Birth: 06-Apr-1961 Referring Provider (PT): Dr. Erroll Luna   Encounter Date: 04/05/2018  PT End of Session - 04/05/18 1523    Visit Number  9    Number of Visits  12    Date for PT Re-Evaluation  04/18/18    PT Start Time  1430    PT Stop Time  1515    PT Time Calculation (min)  45 min    Activity Tolerance  Patient tolerated treatment well    Behavior During Therapy  [facility] for tasks assessed/performed       Past Medical History:  Diagnosis Date  . Abnormal CAT scan 2013  . Anxiety   . Cancer ([place])   . Family history of breast cancer   . Family history of pancreatic cancer   . Family history of prostate cancer   . GERD (gastroesophageal reflux disease)   . Hepatitis C 2001   Followed by Dr. Charlean Sanfilippo at [facility]  . History of radiation therapy 07/26/17- 08/22/17   40.05 Gy directed to the left breast in 15 fractions followed by a boost of 10 Gy in 5 fractions.   . Personal history of radiation therapy   . PONV (postoperative nausea and vomiting)     Past Surgical History:  Procedure Laterality Date  . ABDOMINAL HYSTERECTOMY  2009  . breast cyst removal  1995  . BREAST LUMPECTOMY    . BREAST LUMPECTOMY WITH RADIOACTIVE SEED AND SENTINEL LYMPH NODE BIOPSY Left 06/14/2017   Procedure: LEFT BREAST SEED LOCALIZED LUMPECTOMY (2 SEEDS) AND SENTINEL LYMPH NODE BIOPSY;  Surgeon: Erroll Luna, MD;  Location: [place];  Service: General;  Laterality: Left;  . COLONOSCOPY  2011  . COLONOSCOPY WITH PROPOFOL N/A 12/12/2017   Procedure: COLONOSCOPY WITH PROPOFOL;  Surgeon: Jonathon Bellows, MD;  Location: [facility] ENDOSCOPY;  Service: Gastroenterology;  Laterality: N/A;  . ESOPHAGOGASTRODUODENOSCOPY (EGD) WITH PROPOFOL N/A 12/12/2017   Procedure: ESOPHAGOGASTRODUODENOSCOPY (EGD) WITH PROPOFOL;  Surgeon: Jonathon Bellows, MD;  Location: [facility] ENDOSCOPY;  Service: Gastroenterology;  Laterality: N/A;  . FLEXIBLE SIGMOIDOSCOPY  February 12, 2013   have normal perianal exam, question focal prolapse. 2 small scars in the rectum.  Marland Kitchen HEMORRHOID BANDING  2012   3 times over three months  . SIGMOIDOSCOPY  2013  . TONSILECTOMY/ADENOIDECTOMY WITH MYRINGOTOMY  remote  . UPPER GI ENDOSCOPY  2013    There were no vitals filed for this visit.  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 Radiation was completed 08/22/17.  Has been on tamoxifen since January. Otherwise healthy. Did have a right shoulder impingement a year ago or so and says that is a lot better than it was..  Seroma came up in the superior breast and was drained 4 times.     Patient Stated Goals  improve the status of the Lt chest pull and arm movments    Currently in Pain?  No/denies                       [facility] Adult PT Treatment/Exercise - 04/05/18 0001  Exercises   Exercises  Shoulder      Shoulder Exercises: Supine   Other Supine Exercises  lying over purple ball at thoracic spine       Shoulder Exercises: Seated   Other Seated Exercises  XYW for warm up x 5       Shoulder Exercises: Standing   Other Standing Exercises  robber stretch, facing wall for elbow pull backs with theraband     Other Standing Exercises  wall and doorway stretch for pec major       Shoulder Exercises: Therapy Ball   Flexion  Both;10 reps    Flexion Limitations  with overstretch at the top       Manual Therapy   Soft tissue mobilization  to left pec along muscle belly to humerus with arm in elevation and external rotation     Myofascial Release  to left breast around scar, and medial  and superior breast                   PT Long Term Goals - 03/29/18 1141      PT LONG TERM GOAL #1   Title  Pt. will report at least 50% decrease in discomfort and greater ease in reaching left arm forward and up.    Status  Achieved      PT LONG TERM GOAL #2   Title  Pt. will be independent with home exercise program for shoulder ROM    Status  On-going      PT LONG TERM GOAL #3   Title  Pt will peform shoulder AROM WNL without increased pulling into the Lt chest wall     Status  Achieved      PT LONG TERM GOAL #4   Title  Pt will return to gym activities without limitations     Status  Partially Met      PT LONG TERM GOAL #5   Title  Pt. will report cording has decreased to the point that it is difficult to palpate or see.    Status  Achieved            Plan - 04/05/18 1523    Clinical Impression Statement  Pt states she breast and chest feel better than when she started but she still has visible pulling at the scar on left breast where seroma was. She has a mammogram on Monday which was clear and it appears that seroma is staying closed     Rehab Potential  Excellent    PT Frequency  2x / week    PT Duration  4 weeks    PT Treatment/Interventions  ADLs/Self Care Home Management;Moist Heat;Therapeutic exercise;Patient/family education;Manual techniques;Manual lymph drainage;Compression bandaging;Scar mobilization;Passive range of motion    PT Next Visit Plan  continue with soft tissue work, stretching and myofascial release to pec and around scar     Consulted and Agree with Plan of Care  Patient       Patient will benefit from skilled therapeutic intervention in order to improve the following deficits and impairments:  Decreased range of motion, Increased fascial restricitons, Pain, Impaired UE functional use  Visit Diagnosis: Stiffness of left shoulder, not elsewhere classified  Aftercare following surgery for neoplasm  Other symptoms and signs  involving the musculoskeletal system  Scar condition and fibrosis of skin     Problem List Patient Active Problem List   Diagnosis Date Noted  . Genetic testing 07/28/2017  . GERD without esophagitis 07/05/2017  .  Family history of pancreatic cancer   . Family history of prostate cancer   . Family history of breast cancer   . Aortic atherosclerosis ([place]) 04/28/2017  . Malignant neoplasm of upper-inner quadrant of left breast in female, estrogen receptor positive ([place]) 04/27/2017  . Hepatitis C, chronic ([place]) 09/21/2016  . Health care maintenance 07/29/2015  . Menopausal disorder 01/20/2014  . 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 Rectal pain 05/31/2013  . Hemorrhoids 05/31/2013  . Rectal fissure 11/20/2012  . 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 Bloating 10/03/2012  . Nausea 10/03/2012  . Anal pain 05/09/2012  . Anxiety and depression 04/04/2012  . Diverticulosis 03/21/2012  . HEPATITIS C 01/08/2009  . SHORTNESS OF BREATH 01/08/2009   Donato Heinz. Owens Shark PT  Norwood Levo 04/05/2018, 3:25 PM  [place], [place], [address] Phone: [phone]   Fax:  [phone]  Name: KRITI KATAYAMA MRN: [id number] Date of Birth: [birth year]

## 2018-04-07 ENCOUNTER — Ambulatory Visit: Admitting: Rehabilitation

## 2018-04-13 ENCOUNTER — Ambulatory Visit: Admitting: Rehabilitation

## 2018-04-13 ENCOUNTER — Encounter: Payer: Self-pay | Admitting: Rehabilitation

## 2018-04-13 DIAGNOSIS — L905 Scar conditions and fibrosis of skin: Secondary | ICD-10-CM

## 2018-04-13 DIAGNOSIS — Z483 Aftercare following surgery for neoplasm: Secondary | ICD-10-CM

## 2018-04-13 DIAGNOSIS — M25612 Stiffness of left shoulder, not elsewhere classified: Secondary | ICD-10-CM

## 2018-04-13 DIAGNOSIS — R29898 Other symptoms and signs involving the musculoskeletal system: Secondary | ICD-10-CM

## 2018-04-13 NOTE — Therapy (Signed)
[place], [place], [address] Phone: [phone]   Fax:  [phone]  Physical Therapy Treatment  Patient Details  Name: Courtney Romero MRN: [id number] Date of Birth: [birth year] Referring Provider (PT): Dr. Erroll Luna   Encounter Date: 04/13/2018  PT End of Session - 04/13/18 1438    Visit Number  10    Number of Visits  12    Date for PT Re-Evaluation  04/18/18    PT Start Time  7106    PT Stop Time  1515    PT Time Calculation (min)  38 min    Activity Tolerance  Patient tolerated treatment well    Behavior During Therapy  [facility] for tasks assessed/performed       Past Medical History:  Diagnosis Date  . Abnormal CAT scan 2013  . Anxiety   . Cancer ([place])   . Family history of breast cancer   . Family history of pancreatic cancer   . Family history of prostate cancer   . GERD (gastroesophageal reflux disease)   . Hepatitis C 2001   Followed by Dr. Charlean Sanfilippo at [facility]  . History of radiation therapy 07/26/17- 08/22/17   40.05 Gy directed to the left breast in 15 fractions followed by a boost of 10 Gy in 5 fractions.   . Personal history of radiation therapy   . PONV (postoperative nausea and vomiting)     Past Surgical History:  Procedure Laterality Date  . ABDOMINAL HYSTERECTOMY  2009  . breast cyst removal  1995  . BREAST LUMPECTOMY    . BREAST LUMPECTOMY WITH RADIOACTIVE SEED AND SENTINEL LYMPH NODE BIOPSY Left 06/14/2017   Procedure: LEFT BREAST SEED LOCALIZED LUMPECTOMY (2 SEEDS) AND SENTINEL LYMPH NODE BIOPSY;  Surgeon: Erroll Luna, MD;  Location: [place];  Service: General;  Laterality: Left;  . COLONOSCOPY  2011  . COLONOSCOPY WITH PROPOFOL N/A 12/12/2017   Procedure: COLONOSCOPY WITH PROPOFOL;  Surgeon: Jonathon Bellows, MD;  Location: [facility] ENDOSCOPY;  Service: Gastroenterology;  Laterality: N/A;  . ESOPHAGOGASTRODUODENOSCOPY (EGD) WITH PROPOFOL N/A 12/12/2017   Procedure: ESOPHAGOGASTRODUODENOSCOPY (EGD) WITH PROPOFOL;  Surgeon: Jonathon Bellows, MD;  Location: [facility] ENDOSCOPY;  Service: Gastroenterology;  Laterality: N/A;  . FLEXIBLE SIGMOIDOSCOPY  February 12, 2013   have normal perianal exam, question focal prolapse. 2 small scars in the rectum.  Marland Kitchen HEMORRHOID BANDING  2012   3 times over three months  . SIGMOIDOSCOPY  2013  . TONSILECTOMY/ADENOIDECTOMY WITH MYRINGOTOMY  remote  . UPPER GI ENDOSCOPY  2013    There were no vitals filed for this visit.  Subjective Assessment - 04/13/18 1436    Subjective  I'm not sure if I will be here monday or not    Pertinent History  Left breast cancer diagnosed 04/14/17 and had lumpectomy 06/14/17 with SLNB (3 nodes). Radiation was completed 08/22/17.  Has been on tamoxifen since January. Otherwise healthy. Did have a right shoulder impingement a year ago or so and says that is a lot better than it was..  Seroma came up in the superior breast and was drained 4 times.     Patient Stated Goals  improve the status of the Lt chest pull and arm movments    Currently in Pain?  No/denies         [facility] PT Assessment - 04/13/18 0001      AROM   Left Shoulder Flexion  172 Degrees    Left Shoulder ABduction  178 Degrees      Palpation   Palpation comment  still with some fibrosis at the                    [facility] Adult PT Treatment/Exercise - 04/13/18 0001      Shoulder Exercises: Supine   Other Supine Exercises  lying over large ball horizontal abduction, Y, and flexion 10" x 2 bilatearl       Manual Therapy   Soft tissue mobilization  scar work and STM into the [employer], breast tissue, and lumpectomy incision    Myofascial Release  to left breast around scar, and medial and superior breast     Passive ROM  to Lt shoulder in supine to tolerance with STM into breast and pectoralis as tolerated                   PT Long Term Goals - 04/13/18 1511      PT LONG TERM GOAL #1   Title  Pt.  will report at least 50% decrease in discomfort and greater ease in reaching left arm forward and up.    Status  Achieved      PT LONG TERM GOAL #2   Title  Pt. will be independent with home exercise program for shoulder ROM    Status  Achieved      PT LONG TERM GOAL #3   Title  Pt will peform shoulder AROM WNL without increased pulling into the Lt chest wall     Status  Achieved      PT LONG TERM GOAL #4   Title  Pt will return to gym activities without limitations     Status  Achieved      PT LONG TERM GOAL #5   Title  Pt. will report cording has decreased to the point that it is difficult to palpate or see.    Status  Achieved            Plan - 04/13/18 1715    Clinical Impression Statement  Pt with WNL PROM without significant pulling into the breast today.  AROM equal and painfree.  Pt is now independent with gym program and self stretching.  All goals have been met.      PT Frequency  2x / week    PT Duration  4 weeks    PT Treatment/Interventions  ADLs/Self Care Home Management;Moist Heat;Therapeutic exercise;Patient/family education;Manual techniques;Manual lymph drainage;Compression bandaging;Scar mobilization;Passive range of motion       Patient will benefit from skilled therapeutic intervention in order to improve the following deficits and impairments:  Decreased range of motion, Increased fascial restricitons, Pain, Impaired UE functional use  Visit Diagnosis: Stiffness of left shoulder, not elsewhere classified  Aftercare following surgery for neoplasm  Other symptoms and signs involving the musculoskeletal system  Scar condition and fibrosis of skin     Problem List Patient Active Problem List   Diagnosis Date Noted  . Genetic testing 07/28/2017  . GERD without esophagitis 07/05/2017  . Family history of pancreatic cancer   . Family history of prostate cancer   . Family history of breast cancer   . Aortic atherosclerosis ([place]) 04/28/2017  .  Malignant neoplasm of upper-inner quadrant of left breast in female, estrogen receptor positive ([place]) 04/27/2017  . Hepatitis C, chronic ([place]) 09/21/2016  . Health care maintenance 07/29/2015  . Menopausal disorder 01/20/2014  . 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 Rectal pain 05/31/2013  .  Hemorrhoids 05/31/2013  . Rectal fissure 11/20/2012  . 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 Bloating 10/03/2012  . Nausea 10/03/2012  . Anal pain 05/09/2012  . Anxiety and depression 04/04/2012  . Diverticulosis 03/21/2012  . HEPATITIS C 01/08/2009  . SHORTNESS OF BREATH 01/08/2009    Shan Levans, PT 04/13/2018, 5:17 PM  [place], [place], [address] Phone: [phone]   Fax:  [phone]  Name: Courtney Romero MRN: [id number] Date of Birth: 11/14/1960

## 2018-04-14 ENCOUNTER — Telehealth: Payer: Self-pay

## 2018-04-14 NOTE — Telephone Encounter (Signed)
-----   Message from Gardenia Phlegm, NP sent at 04/14/2018  8:03 AM EST ----- She should take vitamin d 1000 IU daily

## 2018-04-14 NOTE — Telephone Encounter (Signed)
Spoke with patient about BD test.  Gave NP recommendations.  Patient states that some one had already called and gave her results and recommended she take calcium as well.

## 2018-04-17 ENCOUNTER — Encounter: Admitting: Rehabilitation

## 2018-05-10 ENCOUNTER — Encounter: Payer: Self-pay | Admitting: Internal Medicine

## 2018-05-10 ENCOUNTER — Ambulatory Visit (INDEPENDENT_AMBULATORY_CARE_PROVIDER_SITE_OTHER): Admitting: Internal Medicine

## 2018-05-10 VITALS — BP 140/82 | HR 80 | Ht 67.0 in | Wt 158.2 lb

## 2018-05-10 DIAGNOSIS — R0600 Dyspnea, unspecified: Secondary | ICD-10-CM

## 2018-05-10 DIAGNOSIS — J181 Lobar pneumonia, unspecified organism: Secondary | ICD-10-CM | POA: Diagnosis not present

## 2018-05-10 DIAGNOSIS — R918 Other nonspecific abnormal finding of lung field: Secondary | ICD-10-CM | POA: Diagnosis not present

## 2018-05-10 NOTE — Progress Notes (Signed)
[facility] Consultation      Date: 05/10/2018,   MRN# [id number] KIMBERLY NIELAND [birth year]     AdmissionWeight: 158 lb 3.2 oz (71.8 kg)                 CurrentWeight: 158 lb 3.2 oz (71.8 kg) Courtney Romero is a 58 y.o. old female seen in consultation for cough at the request of Dr. Virgia Land.  Patient profile 58 yo Patient is former smoker, states that she has irritation back of throat and sometimes in middle of chest Smoked 1.5 PPD for 20 years She works at [employer] and exposed to hair sprays all day long  Patient also with h/o 2 R lung nodules subcentimeter RML,RLL stable in size First scan in 03/2015 and second scan in 06/2015 shows no change in nodule size and no acute findings Third CT scan 02/2016 shows stable lung nodules since 03/2015  diagnosed with breast cancer status post lumpectomy in February 2019 Patient underwent radiation therapy 20 treatments in May 2019   Office SPiro shows NORMAL SPIRO Ratio 84% Fev1 3.1 L 104% FVC 3.7 97%  PFT 02/14/18 Ratio 82% FEv1107% predicted No evidence of obstructive or restrictive lung disease   CHIEF COMPLAINT:   Follow up pneumonia   HISTORY OF PRESENT ILLNESS   No signs of infection  No SOB at rest  +DOE with wheezing and elevated HR   No sinus cingestion  CT chest 01/2018 shows RML opacity c/w pneumonia Patient was given oral ABX with Z pak  No acute respiratory issues at this time No fevers no chills no coughing no wheezing Exertional dyspnea while playing golf  Patient does not want to use albuterol inhaler at this time  Patient notes elevated heart rate associated with dizziness and shortness of breath  PFTs do not show any evidence of obstructive or restrictive lung disease Results reviewed with patient  No signs of heart failure at this time No lower extremity swelling   Current Medication:  Current Outpatient Medications:  .  cholecalciferol (VITAMIN D) 1000 units  tablet, Take 1,000 Units by mouth daily., Disp: , Rfl:  .  LORazepam (ATIVAN) 0.5 MG tablet, Take 1 mg by mouth as needed. , Disp: , Rfl:  .  Melatonin 5 MG TABS, Take 1 tablet by mouth., Disp: , Rfl:  .  omeprazole (PRILOSEC) 40 MG capsule, Take 40 mg by mouth daily., Disp: , Rfl:  .  tamoxifen (NOLVADEX) 20 MG tablet, Take 1 tablet (20 mg total) by mouth daily., Disp: 90 tablet, Rfl: 4 .  zolpidem (AMBIEN) 5 MG tablet, Take by mouth as needed. , Disp: , Rfl:     ALLERGIES   Propoxyphene; Thioridazine hcl; Bupropion; Citalopram; Duloxetine; and Thioridazine     REVIEW OF SYSTEMS   Review of Systems  Constitutional: Negative for chills, diaphoresis, fever, malaise/fatigue and weight loss.  HENT: Negative for congestion.   Respiratory: Positive for shortness of breath. Negative for cough, hemoptysis, sputum production and wheezing.   Cardiovascular: Negative for chest pain, palpitations, orthopnea and leg swelling.  Gastrointestinal: Negative for heartburn.  Neurological: Negative for weakness.  All other systems reviewed and are negative.    BP 140/82 (BP Location: Right Arm, Cuff Size: Normal)   Pulse 80   Ht 5\' 7"  (1.702 m)   Wt 158 lb 3.2 oz (71.8 kg)   SpO2 98%   BMI 24.78 kg/m     PHYSICAL EXAM  Physical Exam  Constitutional: She is  oriented to person, place, and time. No distress.  HENT:  Mouth/Throat: No oropharyngeal exudate.  Neck: Neck supple.  Cardiovascular: Normal rate, regular rhythm and normal heart sounds.  No murmur heard. Pulmonary/Chest: Effort normal and breath sounds normal. No stridor. No respiratory distress. She has no wheezes.  Musculoskeletal: Normal range of motion.        General: No edema.  Neurological: She is alert and oriented to person, place, and time. No cranial nerve deficit.  Skin: Skin is warm. She is not diaphoretic.  Psychiatric: She has a normal mood and affect.         IMAGING   CT Chest 02/2016 Pulmonary nodules  measure 7 mm or less in size and are stable from 03/28/2015.  CT chest 01/2018 Right middle lobe opacification Findings consistent with pneumonia Im CT chest 73 ages reveiwed  ASSESSMENT/PLAN     58 year old [place] white female seen today for follow-up abnormal CT chest with right middle lobe opacification diagnosed with lobar pneumonia several months ago currently does not have any acute respiratory issues at this time however does have some dyspnea on exertion associated with elevated heart rate and lightheadedness  She does have a history of breast cancer status post radiation therapy with bilateral lung nodules in association with chronic allergic rhinitis     Dyspnea on exertion with elevated heart rate Referred to cardiology for stress test and an assessment for cardiac event monitoring    History of shortness of breath Based on pulmonary function testing and her lung nodules Patient does not have any signs symptoms of obstructive lung disease  There is no significant evidence of radiation pneumonitis at this time No indication for prednisone no indication for inhalers at this time     CT chest with lung nodules/right middle lobe opacification CT chest in 6 months for interval assessment     GERD  continue PPI   Patient/Family are satisfied with Plan of action and management. All questions answered Follow-up in 6 months   Quinlyn Tep Patricia Pesa, M.D.  [facility]  Medical Director [place] Director [facility] Cardio-Pulmonary Department

## 2018-05-10 NOTE — Patient Instructions (Addendum)
CT chest in 6 months  REFERRAL TO CARDIOLOGY FOR ELEVATED HR AND SOB and FAM h/o MI

## 2018-05-14 ENCOUNTER — Other Ambulatory Visit: Payer: Self-pay

## 2018-05-14 ENCOUNTER — Ambulatory Visit
Admission: EM | Admit: 2018-05-14 | Discharge: 2018-05-14 | Disposition: A | Discharge status: Home / Self Care | Attending: Family Medicine | Admitting: Family Medicine

## 2018-05-14 ENCOUNTER — Encounter: Payer: Self-pay | Admitting: Emergency Medicine

## 2018-05-14 DIAGNOSIS — J069 Acute upper respiratory infection, unspecified: Secondary | ICD-10-CM | POA: Insufficient documentation

## 2018-05-14 MED ORDER — HYDROCOD POLST-CPM POLST ER 10-8 MG/5ML PO SUER: 5.0000 mL | Freq: Two times a day (BID) | ORAL | 0 refills | Status: DC

## 2018-05-14 MED ORDER — BENZONATATE 200 MG PO CAPS: ORAL_CAPSULE | ORAL | 0 refills | Status: DC

## 2018-05-14 MED ORDER — AZITHROMYCIN 250 MG PO TABS: 250.0000 mg | ORAL_TABLET | Freq: Every day | ORAL | 0 refills | Status: DC

## 2018-05-14 NOTE — ED Provider Notes (Signed)
[facility]    CSN: [id number] Arrival date & time: 05/14/18  1008     History   Chief Complaint Chief Complaint  Patient presents with  . Nasal Congestion    APPT  . Cough    HPI Courtney Romero is a 59 y.o. female.   HPI  -year-old female presents with 3-day history of cough at night with nasal congestion.  She said no fever or chills.  Has been taking amoxicillin that she had leftover has not noticed any improvement.  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  Temperature today is 98.8 pulse rate of 115 blood pressure 128/94 respirations 18 and O2 sats 98% on room air.       Past Medical History:  Diagnosis Date  . Abnormal CAT scan 2013  . Anxiety   . Cancer ([place])   . Family history of breast cancer   . Family history of pancreatic cancer   . Family history of prostate cancer   . GERD (gastroesophageal reflux disease)   . Hepatitis C 2001   Followed by Dr. Charlean Sanfilippo at [facility]  . History of radiation therapy 07/26/17- 08/22/17   40.05 Gy directed to the left breast in 15 fractions followed by a boost of 10 Gy in 5 fractions.   . Personal history of radiation therapy   . PONV (postoperative nausea and vomiting)     Patient Active Problem List   Diagnosis Date Noted  . Genetic testing 07/28/2017  . GERD without esophagitis 07/05/2017  . Family history of pancreatic cancer   . Family history of prostate cancer   . Family history of breast cancer   . Aortic atherosclerosis ([place]) 04/28/2017  . Malignant neoplasm of upper-inner quadrant of left breast in female, estrogen receptor positive ([place]) 04/27/2017  . Hepatitis C, chronic ([place]) 09/21/2016  . Health care maintenance 07/29/2015  . Menopausal disorder 01/20/2014  . 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 Rectal pain 05/31/2013  . Hemorrhoids 05/31/2013  . Rectal fissure 11/20/2012  . 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 Bloating 10/03/2012  . Nausea 10/03/2012  . Anal  pain 05/09/2012  . Anxiety and depression 04/04/2012  . Diverticulosis 03/21/2012  . HEPATITIS C 01/08/2009  . SHORTNESS OF BREATH 01/08/2009    Past Surgical History:  Procedure Laterality Date  . ABDOMINAL HYSTERECTOMY  2009  . breast cyst removal  1995  . BREAST LUMPECTOMY    . BREAST LUMPECTOMY WITH RADIOACTIVE SEED AND SENTINEL LYMPH NODE BIOPSY Left 06/14/2017   Procedure: LEFT BREAST SEED LOCALIZED LUMPECTOMY (2 SEEDS) AND SENTINEL LYMPH NODE BIOPSY;  Surgeon: Erroll Luna, MD;  Location: [facility];  Service: General;  Laterality: Left;  . COLONOSCOPY  2011  . COLONOSCOPY WITH PROPOFOL N/A 12/12/2017   Procedure: COLONOSCOPY WITH PROPOFOL;  Surgeon: Jonathon Bellows, MD;  Location: [facility] ENDOSCOPY;  Service: Gastroenterology;  Laterality: N/A;  . ESOPHAGOGASTRODUODENOSCOPY (EGD) WITH PROPOFOL N/A 12/12/2017   Procedure: ESOPHAGOGASTRODUODENOSCOPY (EGD) WITH PROPOFOL;  Surgeon: Jonathon Bellows, MD;  Location[facility] ENDOSCOPY;  Service: Gastroenterology;  Laterality: N/A;  . FLEXIBLE SIGMOIDOSCOPY  February 12, 2013   have normal perianal exam, question focal prolapse. 2 small scars in the rectum.  Marland Kitchen HEMORRHOID BANDING  2012   3 times over three months  . SIGMOIDOSCOPY  2013  . TONSILECTOMY/ADENOIDECTOMY WITH MYRINGOTOMY  remote  . UPPER GI ENDOSCOPY  2013    OB History    Gravida  0   Para  Term      Preterm      AB      Living  0     SAB      TAB      Ectopic      Multiple      Live Births           Obstetric Comments  1st Menstrual Cycle: 14         Home Medications    Prior to Admission medications   Medication Sig Start Date End Date Taking? Authorizing Provider  cholecalciferol (VITAMIN D) 1000 units tablet Take 1,000 Units by mouth daily.   Yes [provider]  LORazepam (ATIVAN) 0.5 MG tablet Take 1 mg by mouth as needed.    Yes [provider]  Melatonin 5 MG TABS Take 1 tablet by mouth.   Yes [provider]  omeprazole (PRILOSEC) 40 MG capsule Take 40 mg by mouth daily.   Yes [provider]  tamoxifen (NOLVADEX) 20 MG tablet Take 1 tablet (20 mg total) by mouth daily. 06/23/17  Yes Magrinat, Virgie Dad, MD  azithromycin (ZITHROMAX) 250 MG tablet Take 1 tablet (250 mg total) by mouth daily. Take first 2 tablets together, then 1 every day until finished. 05/19/18   Lorin Picket, PA-C  benzonatate (TESSALON) 200 MG capsule Take one cap TID PRN cough 05/14/18   Lorin Picket, PA-C  chlorpheniramine-HYDROcodone [facility]) 10-8 MG/5ML SUER Take 5 mLs by mouth 2 (two) times daily. 05/14/18   Lorin Picket, PA-C  zolpidem (AMBIEN) 5 MG tablet Take by mouth as needed.  02/06/15 05/19/17  [provider]    Family History Family History  Problem Relation Age of Onset  . Hypertension Mother   . Thyroid disease Mother        Multi problems  . Coronary artery disease Father   . Heart failure Father   . Atrial fibrillation Father   . Diabetes Brother     Social History Social History   Tobacco Use  . Smoking status: Former Smoker    Packs/day: 1.50    Years: 20.00    Pack years: 30.00    Types: Cigarettes    Last attempt to quit: 04/19/1998    Years since quitting: 20.0  . Smokeless tobacco: Never Used  . Tobacco comment: quit smoking in 2000  Substance Use Topics  . Alcohol use: No  . Drug use: No     Allergies   Propoxyphene; Thioridazine hcl; Bupropion; Citalopram; Duloxetine; and Thioridazine   Review of Systems Review of Systems  Constitutional: Positive for activity change. Negative for appetite change, chills, fatigue and fever.  HENT: Positive for congestion, postnasal drip and rhinorrhea.   Respiratory: Positive for cough.   All other systems reviewed and are negative.    Physical Exam Triage Vital Signs ED Triage Vitals  Enc Vitals Group     BP 05/14/18 1025 (!) 128/94     Pulse Rate 05/14/18 1025 (!) 115      Resp 05/14/18 1025 18     Temp 05/14/18 1025 98.8 F (37.1 C)     Temp Source 05/14/18 1025 Oral     SpO2 05/14/18 1025 98 %     Weight 05/14/18 1023 156 lb (70.8 kg)     Height 05/14/18 1023 5\' 7"  (1.702 m)     Head Circumference --      Peak Flow --      Pain Score  05/14/18 1023 0     Pain Loc --      Pain Edu? --      Excl. in [place]? --    No data found.  Updated Vital Signs BP (!) 128/94 (BP Location: Left Arm)   Pulse (!) 115   Temp 98.8 F (37.1 C) (Oral)   Resp 18   Ht 5\' 7"  (1.702 m)   Wt 156 lb (70.8 kg)   SpO2 98%   BMI 24.43 kg/m   Visual Acuity Right Eye Distance:   Left Eye Distance:   Bilateral Distance:    Right Eye Near:   Left Eye Near:    Bilateral Near:     Physical Exam Vitals signs and nursing note reviewed.  Constitutional:      Appearance: Normal appearance. She is normal weight. She is not ill-appearing, toxic-appearing or diaphoretic.  HENT:     Head: Normocephalic and atraumatic.     Right Ear: Tympanic membrane, ear canal and external ear normal.     Left Ear: Tympanic membrane, ear canal and external ear normal.     Nose: Congestion present.     Mouth/Throat:     Mouth: Mucous membranes are moist.     Pharynx: No oropharyngeal exudate or posterior oropharyngeal erythema.  Eyes:     General:        Right eye: No discharge.        Left eye: No discharge.     Conjunctiva/sclera: Conjunctivae normal.  Neck:     Musculoskeletal: Normal range of motion and neck supple.  Pulmonary:     Effort: Pulmonary effort is normal.     Breath sounds: Normal breath sounds.  Musculoskeletal: Normal range of motion.  Lymphadenopathy:     Cervical: No cervical adenopathy.  Skin:    General: Skin is warm and dry.  Neurological:     General: No focal deficit present.     Mental Status: She is alert and oriented to person, place, and time.  Psychiatric:        Mood and Affect: Mood normal.        Behavior: Behavior normal.        Thought Content:  Thought content normal.        Judgment: Judgment normal.      UC Treatments / Results  Labs (all labs ordered are listed, but only abnormal results are displayed) Labs Reviewed - No data to display  EKG None  Radiology No results found.  Procedures Procedures (including critical care time)  Medications Ordered in UC Medications - No data to display  Initial Impression / Assessment and Plan / UC Course  I have reviewed the triage vital signs and the nursing notes.  Pertinent labs & imaging results that were available during my care of the patient were reviewed by me and considered in my medical decision making (see chart for details).   Patient that she has upper respiratory infection likely viral and does not require antibiotics.  I would recommend that she stop the amoxicillin for the present time.  I will provide her with a prescription for wait and see that she may fill in 7 to 10 days if she continues to have her symptoms.  The meantime I will provide her with cough suppressant medications and also recommend using Flonase.   Final Clinical Impressions(s) / UC Diagnoses   Final diagnoses:  Acute upper respiratory infection     Discharge Instructions     Drink plenty  of fluids.  Rest as much as possible.  Use Tylenol or Motrin for fever chills or body aches.    ED Prescriptions    Medication Sig Dispense Auth. Provider   benzonatate (TESSALON) 200 MG capsule Take one cap TID PRN cough 30 capsule Lorin Picket, PA-C   chlorpheniramine-HYDROcodone (TUSSIONEX PENNKINETIC ER) 10-8 MG/5ML SUER Take 5 mLs by mouth 2 (two) times daily. 115 mL Crecencio Mc P, PA-C   azithromycin (ZITHROMAX) 250 MG tablet Take 1 tablet (250 mg total) by mouth daily. Take first 2 tablets together, then 1 every day until finished. 6 tablet Lorin Picket, PA-C     Controlled Substance Prescriptions [place] Controlled Substance Registry consulted? Not Applicable   Lorin Picket,  PA-C 05/14/18 1130

## 2018-05-14 NOTE — ED Triage Notes (Signed)
Patient c/o cough at night, nasal congestion that started on Thursday. Denies fever.

## 2018-05-14 NOTE — Discharge Instructions (Addendum)
Drink plenty of fluids.  Rest as much as possible.  Use Tylenol or Motrin for fever chills or body aches.  °

## 2018-05-17 NOTE — Progress Notes (Signed)
[place]  [phone] [phone]     ID: Courtney Romero DOB: Oct 04, 1960  MR#: [id number]  PHX#:[id number]  Patient Care Team: [name], MD as PCP - General (Unknown Physician Specialty) Magrinat, Virgie Dad, MD as Consulting Physician (Oncology) Erroll Luna, MD as Consulting Physician (General Surgery) Dermatology, [place] Eppie Gibson, MD as Attending Physician (Radiation Oncology) Delice Bison, Charlestine Massed, NP as Nurse Practitioner (Hematology and Oncology) Benjaman Kindler, MD as Consulting Physician (Obstetrics and Gynecology) OTHER MD:  CHIEF COMPLAINT: Estrogen receptor positive breast cancer  CURRENT TREATMENT:  Tamoxifen  HISTORY OF CURRENT ILLNESS: From the original intake note:   The patient underwent bilateral screening mammography with tomography at the breast center 03/29/2017.  There were some calcifications associated with possible architectural distortion in the left breast.  She was recalled for left unilateral diagnostic mammography with ultrasonography 04/04/2017.  This found the breast density to be category C.  In the upper outer left breast there was a 1.2 cm group of coarse calcifications with persistent distortion.  This was not palpable on exam.  Ultrasound showed a 1.0 cm irregular mass in the left breast at the 11 o'clock position 3 cm from the nipple and also a 1.5 area of posterior acoustic shadowing at the 12 o'clock position 4 cm from the nipple.  The latter was felt to correspond to the area of suspicious calcifications.  Accordingly on 04/08/2017 the patient underwent biopsy of the 2 areas in question. 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  Both biopsy site appeared identical.  Only one prognostic panel was sent, from the 11:00 biopsy, showing  estrogen epidural 100% positive, estrogen receptor 100% positive, both with strong staining intensity, with an MIB-105%, and no HER-2 amplification, the signals ratio being 1.35 and the number per cell 2.10.  She underwent breast MRI 04/28/2017 showing, in the left breast, an area of mass and non-masslike enhancement overall measuring up to 4 cm.  In addition there were 3 separate lesions in the right breast suspicious for malignancy, requiring additional biopsy.   The patient's subsequent history is as detailed below.  INTERVAL HISTORY: Courtney Romero returns today for a follow-up and treatment of her estrogen receptor positive breast cancer. She continues on Tamoxifen, and is generally doing well on it. She reports hot flashes but is able to work through them, and she denies sweating or waking up at night because of this problem.   Since her last visit, [place] underwent bone density scan on 04/13/2018 with a T-score of -1.5.  She also underwent diagnostic bilateral mammogram at [facility] on 04/03/2018 with results showing: breast density category B, and no evidence of malignancy bilaterally.   REVIEW OF SYSTEMS: Courtney Romero reports she is working full-time. She states she is doing cardio classes and walking on her treadmill 2-3 days a week, but she notes she is still gaining weight. She completed physical therapy on 04/13/2018. She takes ativan during the day for anxiety. The patient denies unusual headaches, visual changes, nausea, vomiting, stiff neck, dizziness, or gait imbalance. There has been no cough, phlegm production, or pleurisy, no chest pain or pressure, and no change in bowel or bladder habits. The patient denies fever, rash, bleeding, unexplained fatigue or unexplained weight loss. A detailed review of systems was otherwise entirely negative.   PAST MEDICAL HISTORY: Past Medical History:  Diagnosis Date  .  Abnormal CAT scan 2013  . Anxiety   . Cancer ([place])   . Family history of  breast cancer   . Family history of pancreatic cancer   . Family history of prostate cancer   . GERD (gastroesophageal reflux disease)   . Hepatitis C 2001   Followed by Dr. Charlean Sanfilippo at [facility]  . History of radiation therapy 07/26/17- 08/22/17   40.05 Gy directed to the left breast in 15 fractions followed by a boost of 10 Gy in 5 fractions.   . Personal history of radiation therapy   . PONV (postoperative nausea and vomiting)     PAST SURGICAL HISTORY: Past Surgical History:  Procedure Laterality Date  . ABDOMINAL HYSTERECTOMY  2009  . breast cyst removal  1995  . BREAST LUMPECTOMY    . BREAST LUMPECTOMY WITH RADIOACTIVE SEED AND SENTINEL LYMPH NODE BIOPSY Left 06/14/2017   Procedure: LEFT BREAST SEED LOCALIZED LUMPECTOMY (2 SEEDS) AND SENTINEL LYMPH NODE BIOPSY;  Surgeon: Erroll Luna, MD;  Location: [facility];  Service: General;  Laterality: Left;  . COLONOSCOPY  2011  . COLONOSCOPY WITH PROPOFOL N/A 12/12/2017   Procedure: COLONOSCOPY WITH PROPOFOL;  Surgeon: Jonathon Bellows, MD;  Location: [facility] ENDOSCOPY;  Service: Gastroenterology;  Laterality: N/A;  . ESOPHAGOGASTRODUODENOSCOPY (EGD) WITH PROPOFOL N/A 12/12/2017   Procedure: ESOPHAGOGASTRODUODENOSCOPY (EGD) WITH PROPOFOL;  Surgeon: Jonathon Bellows, MD;  Location: [facility] ENDOSCOPY;  Service: Gastroenterology;  Laterality: N/A;  . FLEXIBLE SIGMOIDOSCOPY  February 12, 2013   have normal perianal exam, question focal prolapse. 2 small scars in the rectum.  Marland Kitchen HEMORRHOID BANDING  2012   3 times over three months  . SIGMOIDOSCOPY  2013  . TONSILECTOMY/ADENOIDECTOMY WITH MYRINGOTOMY  remote  . UPPER GI ENDOSCOPY  2013    FAMILY HISTORY Family History  Problem Relation Age of Onset  . Hypertension Mother   . Thyroid disease Mother        Multi problems  . Coronary artery disease Father   . Heart failure Father   . Atrial fibrillation Father   . Diabetes Brother    As of 2018, the patient's father is 43 years old.  As of  March 2019 the patient's mother is under hospice care and not expected to survive the month.  The patient has three brothers and no sisters. Her maternal great aunt had breast cancer when she was after 6 and her other maternal great aunt had pancreatic cancer. She has no family history of ovarian cancer. The patient's father had prostate cancer in his 37's.   GYNECOLOGIC HISTORY:  No LMP recorded. Patient has had a hysterectomy.  Menarche: 58 years old GP: She never carried a pregnancy to term LMP: Hysterectomy about 10 years ago, no salpingo-oophorectomy HRT: She was on Estradiol on and off the last three years. 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 She uses gloves when using colorings and other chemicals. Her husband [name] is retired and was in hosiery. They have one dog. She is not a Banker.     ADVANCED DIRECTIVES: Not in place   HEALTH MAINTENANCE: Social History   Tobacco Use  . Smoking status: Former Smoker    Packs/day: 1.50    Years: 20.00    Pack years: 30.00    Types: Cigarettes    Last attempt to quit: 04/19/1998    Years since quitting: 20.0  . Smokeless tobacco: Never Used  . Tobacco comment: quit smoking in  2000  Substance Use Topics  . Alcohol use: No  . Drug use: No     Colonoscopy: [facility] in [place]  PAP: Status post hysterectomy  Bone density: at 51 --does not know results   Allergies  Allergen Reactions  . Propoxyphene Rash    Uncoded Allergy. Allergen: mellaril, Other Reaction: Other reaction, double vision Uncoded Allergy. Allergen: mellaril, Other Reaction: Other reaction, double vision Uncoded Allergy. Allergen: mellaril, Other Reaction: Other reaction, double vision  . Thioridazine Hcl     REACTION: Reaction not known  . Bupropion Rash and Other (See Comments)    Insomnia Insomnia  . Citalopram Rash and Other (See Comments)    Lightheaded Lightheaded  . Duloxetine Rash and Other (See  Comments)    Insomnia Insomnia  . Thioridazine Rash    REACTION: Reaction not known REACTION: Reaction not known    Current Outpatient Medications  Medication Sig Dispense Refill  . [START ON 05/19/2018] azithromycin (ZITHROMAX) 250 MG tablet Take 1 tablet (250 mg total) by mouth daily. Take first 2 tablets together, then 1 every day until finished. 6 tablet 0  . benzonatate (TESSALON) 200 MG capsule Take one cap TID PRN cough 30 capsule 0  . chlorpheniramine-HYDROcodone (TUSSIONEX PENNKINETIC ER) 10-8 MG/5ML SUER Take 5 mLs by mouth 2 (two) times daily. 115 mL 0  . cholecalciferol (VITAMIN D) 1000 units tablet Take 1,000 Units by mouth daily.    Marland Kitchen LORazepam (ATIVAN) 0.5 MG tablet Take 1 mg by mouth as needed.     . Melatonin 5 MG TABS Take 1 tablet by mouth.    Marland Kitchen omeprazole (PRILOSEC) 40 MG capsule Take 40 mg by mouth daily.    . tamoxifen (NOLVADEX) 20 MG tablet Take 1 tablet (20 mg total) by mouth daily. 90 tablet 4  . zolpidem (AMBIEN) 5 MG tablet Take by mouth as needed.      No current facility-administered medications for this visit.     OBJECTIVE: Middle-aged white woman who appears well  Vitals:   05/18/18 1032  BP: 133/88  Pulse: 73  Resp: 18  Temp: 99 F (37.2 C)  SpO2: 100%     Body mass index is 24.4 kg/m.   Wt Readings from Last 3 Encounters:  05/18/18 155 lb 12.8 oz (70.7 kg)  05/14/18 156 lb (70.8 kg)  05/10/18 158 lb 3.2 oz (71.8 kg)      ECOG FS:1 - Symptomatic but completely ambulatory  Sclerae unicteric, pupils round and equal No cervical or supraclavicular adenopathy Lungs no rales or rhonchi Heart regular rate and rhythm Abd soft, nontender, positive bowel sounds MSK no focal spinal tenderness, no upper extremity lymphedema Neuro: nonfocal, well oriented, appropriate affect Breasts: The right breast is benign.  The left breast is status post lumpectomy and radiation.  There are some posttreatment changes, which are minor, but there is no evidence  of disease recurrence.  Both axillae are benign   LAB RESULTS:  CMP     Component Value Date/Time   NA 144 05/18/2018 1011   K 4.0 05/18/2018 1011   CL 111 05/18/2018 1011   CO2 26 05/18/2018 1011   GLUCOSE 96 05/18/2018 1011   BUN 14 05/18/2018 1011   CREATININE 0.76 05/18/2018 1011   CREATININE 0.91 04/29/2017 1348   CALCIUM 9.1 05/18/2018 1011   PROT 7.0 05/18/2018 1011   ALBUMIN 3.7 05/18/2018 1011   AST 16 05/18/2018 1011   AST 15 04/29/2017 1348   ALT 18 05/18/2018 1011  ALT 14 04/29/2017 1348   ALKPHOS 49 05/18/2018 1011   BILITOT 0.3 05/18/2018 1011   BILITOT 0.7 04/29/2017 1348   GFRNONAA >60 05/18/2018 1011   GFRNONAA >60 04/29/2017 1348   GFRAA >60 05/18/2018 1011   GFRAA >60 04/29/2017 1348    No results found for: TOTALPROTELP, ALBUMINELP, A1GS, A2GS, BETS, BETA2SER, GAMS, MSPIKE, SPEI  No results found for: KPAFRELGTCHN, LAMBDASER, KAPLAMBRATIO  Lab Results  Component Value Date   WBC 3.1 (L) 05/18/2018   NEUTROABS 1.6 (L) 05/18/2018   HGB 13.0 05/18/2018   HCT 38.8 05/18/2018   MCV 92.8 05/18/2018   PLT 147 (L) 05/18/2018    _0 @  No results found for: LABCA2  No components found for: MGQQPY195  No results for input(s): INR in the last 168 hours.  No results found for: LABCA2  No results found for: KDT267  No results found for: TIW580  No results found for: DXI338  No results found for: CA2729  No components found for: HGQUANT  No results found for: CEA1 / No results found for: CEA1   No results found for: AFPTUMOR  No results found for: CHROMOGRNA  No results found for: PSA1    No results found for: TOTALPROTELP, ALBUMINELP, A1GS, A2GS, BETS, BETA2SER, GAMS, MSPIKE, SPEI (this displays SPEP labs)  No results found for: KPAFRELGTCHN, LAMBDASER, KAPLAMBRATIO (kappa/lambda light chains)  No results found for: HGBA, HGBA2QUANT, HGBFQUANT, HGBSQUAN (Hemoglobinopathy evaluation)   No results found for:  LDH  No results found for: IRON, TIBC, IRONPCTSAT (Iron and TIBC)  No results found for: FERRITIN  Urinalysis    Component Value Date/Time   COLORURINE YELLOW (A) 06/12/2017 1911   APPEARANCEUR HAZY (A) 06/12/2017 1911   LABSPEC 1.025 06/12/2017 1911   PHURINE 5.0 06/12/2017 1911   GLUCOSEU NEGATIVE 06/12/2017 1911   HGBUR NEGATIVE 06/12/2017 1911   BILIRUBINUR NEGATIVE 06/12/2017 1911   KETONESUR NEGATIVE 06/12/2017 1911   PROTEINUR NEGATIVE 06/12/2017 1911   UROBILINOGEN 0.2 08/17/2011 0902   NITRITE NEGATIVE 06/12/2017 1911   LEUKOCYTESUR NEGATIVE 06/12/2017 1911     STUDIES: Bone density and mammography results discussed with the patient  ELIGIBLE FOR AVAILABLE RESEARCH PROTOCOL: no  ASSESSMENT: 58 y.o. Courtney Romero, Courtney Romero woman status post biopsy of 2 areas in the upper outer quadrant of the left breast 04/08/2017, both showing extensive ductal carcinoma in situ low-grade, but both showing invasive ductal carcinoma (measuring 2-3 mm on the slides), grade 1, estrogen and progesterone receptor positive, HER-2 not amplified, with an MIB-1 of 5%  (1) left lumpectomy and sentinel lymph node sampling 02/26/ 2019 showed only residual ductal carcinoma in situ, low-grade, with negative margins.  (a) a total of 3 axillary lymph nodes were removed  (2) adjuvant radiation 07/26/2017-08/22/2017 Site/dose:40.05 Gy directed to the left breast in 15 fractions followed by a boost of 10 Gy in 5 fractions  (3) genetics testing 07/12/2017 through theCommon Hereditary Cancer Panel offered by [facility] i found no deleterious mutations in APC, ATM, AXIN2, BARD1, BMPR1A, BRCA1, BRCA2, BRIP1, CDH1, CDKN2A (p14ARF), CDKN2A (p16INK4a), CKD4, CHEK2, CTNNA1, DICER1, EPCAM (Deletion/duplication testing only), GREM1 (promoter region deletion/duplication testing only), KIT, MEN1, MLH1, MSH2, MSH3, MSH6, MUTYH, NBN, NF1, NHTL1, PALB2, PDGFRA, PMS2, POLD1, POLE, PTEN, RAD50, RAD51C, RAD51D, SDHB, SDHC, SDHD, SMAD4,  SMARCA4. S  (5) tamoxifen started neoadjuvantly 04/29/2017   (6) chest CT 02/15/2018 showed a 12 mm left axillary node not noted on mammography 04/03/2018   PLAN: Courtney Romero is now just about a year out from definitive surgery  for her breast cancer with no evidence of disease recurrence.  This is favorable.  She is tolerating tamoxifen well and the plan is to continue that a minimum of 5 years.  She does have some hot flashes.  Some of that of course may be simply menopause.  She has tried venlafaxine and gabapentin with very poor tolerance.  She does not want to try TTS 1 patches.  Basically she "can live with it" as far as the hot flashes are concerned  She was a bit tearful today talking about her mother who died a year ago.  It would be helpful if she could meet with her brothers and go over memories and salon but they live far away and she really has no one to discuss that with here.  She knows that we do have counseling available if she thinks that would be helpful  I reviewed her bone density which shows mild osteopenia.  She has a good exercise program and is on vitamin D supplementation.  She understands tamoxifen will also help strengthen her bones  She is followed now by pulmonary in [facility] and she tells me they are planning a CT scan in about 5 or 6 months.  She will see Dr. Brantley Stage later this year.  Accordingly she will see me again in 1 year, a few weeks after her next mammogram  She knows to call for any other issue that may develop before the next visit.   Sylvanna Burggraf, Virgie Dad, MD  05/18/18 11:08 AM Medical Oncology and Hematology [facility] [address] [place], [place] [address] Tel. [phone]    Fax. [phone]  I, Wilburn Mylar, am acting as scribe for Dr. Virgie Dad. Mauricia Mertens.  I, Lurline Del MD, have reviewed the above documentation for accuracy and completeness, and I agree with the above.

## 2018-05-18 ENCOUNTER — Inpatient Hospital Stay: Attending: Oncology

## 2018-05-18 ENCOUNTER — Inpatient Hospital Stay (HOSPITAL_BASED_OUTPATIENT_CLINIC_OR_DEPARTMENT_OTHER): Admitting: Oncology

## 2018-05-18 ENCOUNTER — Telehealth: Payer: Self-pay | Admitting: Oncology

## 2018-05-18 VITALS — BP 133/88 | HR 73 | Temp 99.0°F | Resp 18 | Ht 67.0 in | Wt 155.8 lb

## 2018-05-18 DIAGNOSIS — M8580 Other specified disorders of bone density and structure, unspecified site: Secondary | ICD-10-CM | POA: Insufficient documentation

## 2018-05-18 DIAGNOSIS — C50812 Malignant neoplasm of overlapping sites of left female breast: Secondary | ICD-10-CM | POA: Diagnosis not present

## 2018-05-18 DIAGNOSIS — Z170 Estrogen receptor positive status [ER+]: Secondary | ICD-10-CM | POA: Diagnosis not present

## 2018-05-18 DIAGNOSIS — Z79810 Long term (current) use of selective estrogen receptor modulators (SERMs): Secondary | ICD-10-CM | POA: Insufficient documentation

## 2018-05-18 DIAGNOSIS — I700 Atherosclerosis of aorta: Secondary | ICD-10-CM

## 2018-05-18 DIAGNOSIS — C50212 Malignant neoplasm of upper-inner quadrant of left female breast: Secondary | ICD-10-CM

## 2018-05-18 DIAGNOSIS — N951 Menopausal and female climacteric states: Secondary | ICD-10-CM

## 2018-05-18 DIAGNOSIS — C50412 Malignant neoplasm of upper-outer quadrant of left female breast: Secondary | ICD-10-CM

## 2018-05-18 DIAGNOSIS — B182 Chronic viral hepatitis C: Secondary | ICD-10-CM

## 2018-05-18 LAB — COMPREHENSIVE METABOLIC PANEL
ALT: 18 U/L (ref 0–44)
AST: 16 U/L (ref 15–41)
Albumin: 3.7 g/dL (ref 3.5–5.0)
Alkaline Phosphatase: 49 U/L (ref 38–126)
Anion gap: 7 (ref 5–15)
BUN: 14 mg/dL (ref 6–20)
CO2: 26 mmol/L (ref 22–32)
Calcium: 9.1 mg/dL (ref 8.9–10.3)
Chloride: 111 mmol/L (ref 98–111)
Creatinine, Ser: 0.76 mg/dL (ref 0.44–1.00)
GFR calc Af Amer: >60 mL/min (ref >60)
GFR calc non Af Amer: >60 mL/min (ref >60)
Glucose, Bld: 96 mg/dL (ref 70–99)
Potassium: 4 mmol/L (ref 3.5–5.1)
Sodium: 144 mmol/L (ref 135–145)
Total Bilirubin: 0.3 mg/dL (ref 0.3–1.2)
Total Protein: 7 g/dL (ref 6.5–8.1)

## 2018-05-18 LAB — CBC WITH DIFFERENTIAL/PLATELET
Abs Immature Granulocytes: 0.01 10*3/uL (ref 0.00–0.07)
Basophils Absolute: 0 10*3/uL (ref 0.0–0.1)
Basophils Relative: 0 %
Eosinophils Absolute: 0.1 10*3/uL (ref 0.0–0.5)
Eosinophils Relative: 5 %
HCT: 38.8 % (ref 36.0–46.0)
Hemoglobin: 13 g/dL (ref 12.0–15.0)
Immature Granulocytes: 0 %
Lymphocytes Relative: 36 %
Lymphs Abs: 1.1 10*3/uL (ref 0.7–4.0)
MCH: 31.1 pg (ref 26.0–34.0)
MCHC: 33.5 g/dL (ref 30.0–36.0)
MCV: 92.8 fL (ref 80.0–100.0)
Monocytes Absolute: 0.3 10*3/uL (ref 0.1–1.0)
Monocytes Relative: 8 %
Neutro Abs: 1.6 10*3/uL — ABNORMAL LOW (ref 1.7–7.7)
Neutrophils Relative %: 51 %
Platelets: 147 10*3/uL — ABNORMAL LOW (ref 150–400)
RBC: 4.18 MIL/uL (ref 3.87–5.11)
RDW: 13.1 % (ref 11.5–15.5)
WBC: 3.1 10*3/uL — ABNORMAL LOW (ref 4.0–10.5)
nRBC: 0 % (ref 0.0–0.2)

## 2018-05-18 MED ORDER — TAMOXIFEN CITRATE 20 MG PO TABS: 20.0000 mg | ORAL_TABLET | Freq: Every day | ORAL | 4 refills | Status: DC

## 2018-05-18 NOTE — Telephone Encounter (Signed)
Gave avs and calendar ° °

## 2018-05-30 ENCOUNTER — Ambulatory Visit (INDEPENDENT_AMBULATORY_CARE_PROVIDER_SITE_OTHER): Admitting: Cardiovascular Disease

## 2018-05-30 ENCOUNTER — Encounter: Payer: Self-pay | Admitting: Cardiovascular Disease

## 2018-05-30 DIAGNOSIS — R0602 Shortness of breath: Secondary | ICD-10-CM | POA: Diagnosis not present

## 2018-05-30 NOTE — Progress Notes (Signed)
Cardiology Office Note   Date:  05/30/2018   ID:  SANAAI DOANE, DOB 1960/04/23, MRN [id number]  PCP:  [name], MD  Cardiologist:   Kathlyn Sacramento, MD   Chief Complaint  Patient presents with  . New Patient (Initial Visit)    Per Dr. Mortimer Fries for Dyspnea. Meds reviewed verbally with patient.       History of Present Illness: Courtney Romero is a 58 y.o. female who was referred by Dr. Mortimer Fries for evaluation of exertional dyspnea and palpitations.  She has history of previous tobacco use, treated hepatitis C, breast cancer status post lumpectomy in February 2019 followed by radiation therapy.  She was seen by Dr. Mortimer Fries for evaluation of shortness of breath and abnormal CT scan of the chest.  Pulmonary function testing were unremarkable and it was felt that her shortness of breath and tachycardia is out of proportion to her lung disease and thus she was referred for cardiac evaluation. She was seen by Dr. Haroldine Laws in 2010 for evaluation of exertional dyspnea.  She underwent an echocardiogram which showed normal ejection fraction with no evidence of pulmonary hypertension.  Nuclear stress test showed no evidence of ischemia. She also had a stress echocardiogram at [facility] in 2013 that was unremarkable. She describes worsening exertional dyspnea over the last 6 to 10 months.  She feels out of breath if she bends over and stands up quickly.  No chest pain.  No orthopnea, PND or leg edema.  She gained 6 pounds over the last year.  She quit smoking in 2000 but smoked for 25 years.  She does have family history of coronary artery disease.  Her father had myocardial infarction at the age of 46.  She had recent labs done which were reviewed by me and overall were unremarkable except for mild leukopenia and thrombocytopenia.  Past Medical History:  Diagnosis Date  . Abnormal CAT scan 2013  . Anxiety   . Cancer ([place])   . Family history of breast cancer   . Family history of  pancreatic cancer   . Family history of prostate cancer   . GERD (gastroesophageal reflux disease)   . Hepatitis C 2001   Followed by Dr. Charlean Sanfilippo at [facility]  . History of radiation therapy 07/26/17- 08/22/17   40.05 Gy directed to the left breast in 15 fractions followed by a boost of 10 Gy in 5 fractions.   . Personal history of radiation therapy   . PONV (postoperative nausea and vomiting)     Past Surgical History:  Procedure Laterality Date  . ABDOMINAL HYSTERECTOMY  2009  . breast cyst removal  1995  . BREAST LUMPECTOMY    . BREAST LUMPECTOMY WITH RADIOACTIVE SEED AND SENTINEL LYMPH NODE BIOPSY Left 06/14/2017   Procedure: LEFT BREAST SEED LOCALIZED LUMPECTOMY (2 SEEDS) AND SENTINEL LYMPH NODE BIOPSY;  Surgeon: Erroll Luna, MD;  Location: [facility];  Service: General;  Laterality: Left;  . COLONOSCOPY  2011  . COLONOSCOPY WITH PROPOFOL N/A 12/12/2017   Procedure: COLONOSCOPY WITH PROPOFOL;  Surgeon: Jonathon Bellows, MD;  Location: [facility] ENDOSCOPY;  Service: Gastroenterology;  Laterality: N/A;  . ESOPHAGOGASTRODUODENOSCOPY (EGD) WITH PROPOFOL N/A 12/12/2017   Procedure: ESOPHAGOGASTRODUODENOSCOPY (EGD) WITH PROPOFOL;  Surgeon: Jonathon Bellows, MD;  Location: [facility] ENDOSCOPY;  Service: Gastroenterology;  Laterality: N/A;  . FLEXIBLE SIGMOIDOSCOPY  February 12, 2013   have normal perianal exam, question focal prolapse. 2 small scars in the rectum.  Marland Kitchen HEMORRHOID BANDING  2012   3 times over three months  . SIGMOIDOSCOPY  2013  . TONSILECTOMY/ADENOIDECTOMY WITH MYRINGOTOMY  remote  . UPPER GI ENDOSCOPY  2013     Current Outpatient Medications  Medication Sig Dispense Refill  . cholecalciferol (VITAMIN D) 1000 units tablet Take 1,000 Units by mouth daily.    Marland Kitchen LORazepam (ATIVAN) 0.5 MG tablet Take 1 mg by mouth as needed.     . Melatonin 5 MG TABS Take 1 tablet by mouth.    Marland Kitchen omeprazole (PRILOSEC) 40 MG capsule Take 40 mg by mouth daily.    . tamoxifen (NOLVADEX) 20 MG tablet  Take 1 tablet (20 mg total) by mouth daily. 90 tablet 4  . zolpidem (AMBIEN) 5 MG tablet Take by mouth as needed.      No current facility-administered medications for this visit.     Allergies:   Propoxyphene; Thioridazine hcl; Bupropion; Citalopram; Duloxetine; and Thioridazine    Social History:  The patient  reports that she quit smoking about 20 years ago. Her smoking use included cigarettes. She has a 30.00 pack-year smoking history. She has never used smokeless tobacco. She reports that she does not drink alcohol or use drugs.   Family History:  The patient's family history includes Atrial fibrillation in her father; Coronary artery disease in her father; Diabetes in her brother; Heart failure in her father; Hypertension in her mother; Thyroid disease in her mother.    ROS:  Please see the history of present illness.   Otherwise, review of systems are positive for none.   All other systems are reviewed and negative.    PHYSICAL EXAM: VS:  BP 128/80 (BP Location: Left Arm, Patient Position: Sitting, Cuff Size: Normal)   Pulse 76   Ht 5\' 7"  (1.702 m)   Wt 156 lb (70.8 kg)   BMI 24.43 kg/m  , BMI Body mass index is 24.43 kg/m. GEN: Well nourished, well developed, in no acute distress  HEENT: normal  Neck: no JVD, carotid bruits, or masses Cardiac: RRR; no murmurs, rubs, or gallops,no edema  Respiratory:  clear to auscultation bilaterally, normal work of breathing GI: soft, nontender, nondistended, + BS MS: no deformity or atrophy  Skin: warm and dry, no rash Neuro:  Strength and sensation are intact Psych: euthymic mood, full affect   EKG:  EKG is ordered today. The ekg ordered today demonstrates normal sinus rhythm with no significant ST or T wave changes.   Recent Labs: 05/18/2018: ALT 18; BUN 14; Creatinine, Ser 0.76; Hemoglobin 13.0; Platelets 147; Potassium 4.0; Sodium 144    Lipid Panel No results found for: CHOL, TRIG, HDL, CHOLHDL, VLDL, LDLCALC, LDLDIRECT      Wt Readings from Last 3 Encounters:  05/30/18 156 lb (70.8 kg)  05/18/18 155 lb 12.8 oz (70.7 kg)  05/14/18 156 lb (70.8 kg)       No flowsheet data found.    ASSESSMENT AND PLAN:  1.  Exertional dyspnea: We have to rule out anginal equivalent.  EKG does not show any ischemic changes and cardiac exam is unremarkable.  I requested a treadmill Myoview. I also requested an echocardiogram to evaluate diastolic function and pulmonary pressure. Recent labs were unremarkable.   Disposition:   FU with me as needed  Signed,  Kathlyn Sacramento, MD  05/30/2018 3:28 PM    [place]

## 2018-05-30 NOTE — Patient Instructions (Addendum)
Medication Instructions:  None ordered If you need a refill on your cardiac medications before your next appointment, please call your pharmacy.   Lab work: None ordered  Testing/Procedures: Your physician has requested that you have an exercise stress myoview. For further information please visit [email or web address]. Please follow instruction sheet, as given.  Your physician has requested that you have an echocardiogram. Echocardiography is a painless test that uses sound waves to create images of your heart. It provides your doctor with information about the size and shape of your heart and how well your heart's chambers and valves are working. You may receive an ultrasound enhancing agent through an IV if needed to better visualize your heart during the echo.This procedure takes approximately one hour. There are no restrictions for this procedure. This will take place at the [facility] clinic.   Follow-Up: As needed with Dr. Fletcher Anon  Any Other Special Instructions Will Be Listed Below (If Applicable).  [place]  Your provider has ordered a Stress Test with nuclear imaging. The purpose of this test is to evaluate the blood supply to your heart muscle. This procedure is referred to as a "Non-Invasive Stress Test." This is because other than having an IV started in your vein, nothing is inserted or "invades" your body. Cardiac stress tests are done to find areas of poor blood flow to the heart by determining the extent of coronary artery disease (CAD). Some patients exercise on a treadmill, which naturally increases the blood flow to your heart, while others who are unable to walk on a treadmill due to physical limitations have a pharmacologic/chemical stress agent called Lexiscan . This medicine will mimic walking on a treadmill by temporarily increasing your coronary blood flow.   Please note: these test may take anywhere between 2-4 hours to complete  PLEASE REPORT TO [facility] AT THE FIRST DESK WILL DIRECT YOU WHERE TO GO  Date of Procedure:_____________________________________  Arrival Time for Procedure:______________________________  Instructions regarding medication:  None to hold  PLEASE NOTIFY THE OFFICE AT LEAST 24 HOURS IN ADVANCE IF YOU ARE UNABLE TO KEEP YOUR APPOINTMENT.  [phone] AND  PLEASE NOTIFY NUCLEAR MEDICINE AT [facility] AT LEAST 24 HOURS IN ADVANCE IF YOU ARE UNABLE TO KEEP YOUR APPOINTMENT. [phone]  How to prepare for your Myoview test:  1. Do not eat or drink after midnight 2. No caffeine for 24 hours prior to test 3. No smoking 24 hours prior to test. 4. Your medication may be taken with water.  If your doctor stopped a medication because of this test, do not take that medication. 5. Ladies, please do not wear dresses.  Skirts or pants are appropriate. Please wear a short sleeve shirt. 6. No perfume, cologne or lotion. 7. Wear comfortable walking shoes. No heels!

## 2018-06-05 ENCOUNTER — Encounter

## 2018-06-19 ENCOUNTER — Ambulatory Visit
Admission: RE | Admit: 2018-06-19 | Discharge: 2018-06-19 | Disposition: A | Discharge status: Home / Self Care | Source: Ambulatory Visit | Attending: Cardiovascular Disease | Admitting: Cardiovascular Disease

## 2018-06-19 DIAGNOSIS — R0602 Shortness of breath: Secondary | ICD-10-CM | POA: Diagnosis not present

## 2018-06-19 LAB — NM MYOCAR MULTI W/SPECT W/WALL MOTION / EF
Estimated workload: 10.1 METS
Exercise duration (min): 9 min
Exercise duration (sec): 0 s
LV dias vol: 40 mL (ref 46–106)
LV sys vol: 12 mL
Peak HR: 166 {beats}/min
Percent HR: 101 %
Rest HR: 69 {beats}/min
SDS: 4
SRS: 4
SSS: 5
TID: 0.86

## 2018-06-19 MED ORDER — TECHNETIUM TC 99M TETROFOSMIN IV KIT
30.0000 | PACK | Freq: Once | INTRAVENOUS | Status: AC | PRN
  Administered 2018-06-19: 31.354 via INTRAVENOUS

## 2018-06-19 MED ORDER — TECHNETIUM TC 99M TETROFOSMIN IV KIT
10.0000 | PACK | Freq: Once | INTRAVENOUS | Status: AC | PRN
  Administered 2018-06-19: 10.68 via INTRAVENOUS

## 2018-06-30 ENCOUNTER — Encounter: Payer: Self-pay | Admitting: Oncology

## 2018-06-30 ENCOUNTER — Other Ambulatory Visit: Payer: Self-pay

## 2018-06-30 ENCOUNTER — Ambulatory Visit (INDEPENDENT_AMBULATORY_CARE_PROVIDER_SITE_OTHER)

## 2018-06-30 DIAGNOSIS — R0602 Shortness of breath: Secondary | ICD-10-CM | POA: Diagnosis not present

## 2018-07-03 ENCOUNTER — Other Ambulatory Visit: Payer: Self-pay | Admitting: *Deleted

## 2018-07-03 MED ORDER — TAMOXIFEN CITRATE 20 MG PO TABS: 20.0000 mg | ORAL_TABLET | Freq: Every day | ORAL | 4 refills | Status: DC

## 2018-11-08 ENCOUNTER — Ambulatory Visit
Admission: RE | Admit: 2018-11-08 | Discharge: 2018-11-08 | Disposition: A | Discharge status: Home / Self Care | Source: Ambulatory Visit | Attending: Internal Medicine | Admitting: Internal Medicine

## 2018-11-08 ENCOUNTER — Other Ambulatory Visit: Payer: Self-pay

## 2018-11-08 DIAGNOSIS — J181 Lobar pneumonia, unspecified organism: Secondary | ICD-10-CM | POA: Insufficient documentation

## 2018-11-08 DIAGNOSIS — R918 Other nonspecific abnormal finding of lung field: Secondary | ICD-10-CM | POA: Diagnosis present

## 2018-11-13 ENCOUNTER — Telehealth: Payer: Self-pay | Admitting: Internal Medicine

## 2018-11-13 NOTE — Telephone Encounter (Signed)
Called patient for COVID-19 pre-screening for in office visit. ° °Have you recently traveled any where out of the local area in the last 2 weeks? No ° °Have you been in close contact with a person diagnosed with COVID-19 or someone awaiting results within the last 2 weeks? No ° °Do you currently have any of the following symptoms? If so, when did they start? °Cough     Diarrhea   Joint Pain °Fever      Muscle Pain   Red eyes °Shortness of breath   Abdominal pain  Vomiting °Loss of smell    Rash    Sore Throat °Headache    Weakness   Bruising or bleeding ° ° °Okay to proceed with visit 11/14/2018  ° ° °

## 2018-11-14 ENCOUNTER — Encounter: Payer: Self-pay | Admitting: Internal Medicine

## 2018-11-14 ENCOUNTER — Other Ambulatory Visit: Payer: Self-pay

## 2018-11-14 ENCOUNTER — Ambulatory Visit (INDEPENDENT_AMBULATORY_CARE_PROVIDER_SITE_OTHER): Admitting: Internal Medicine

## 2018-11-14 VITALS — BP 130/78 | HR 74 | Temp 98.2°F | Ht 67.0 in | Wt 151.2 lb

## 2018-11-14 DIAGNOSIS — R918 Other nonspecific abnormal finding of lung field: Secondary | ICD-10-CM | POA: Diagnosis not present

## 2018-11-14 DIAGNOSIS — J181 Lobar pneumonia, unspecified organism: Secondary | ICD-10-CM

## 2018-11-14 NOTE — Progress Notes (Signed)
[facility] Consultation      Date: 11/14/2018,   MRN# [id number] Courtney Romero 1960-05-11     Admission                  Current  Courtney Romero is a 58 y.o. old female seen in consultation for cough at the request of Dr. Virgia Land.  Patient profile 58 yo Patient is former smoker, states that she has irritation back of throat and sometimes in middle of chest Smoked 1.5 PPD for 20 years She works at [employer] and exposed to hair sprays all day long  Patient also with h/o 2 R lung nodules subcentimeter RML,RLL stable in size First scan in 03/2015 and second scan in 06/2015 shows no change in nodule size and no acute findings Third CT scan 02/2016 shows stable lung nodules since 03/2015  diagnosed with breast cancer status post lumpectomy in February 2019 Patient underwent radiation therapy 20 treatments in May 2019   Office SPiro shows NORMAL SPIRO Ratio 84% Fev1 3.1 L 104% FVC 3.7 97%  PFT 02/14/18 Ratio 82% FEv1107% predicted No evidence of obstructive or restrictive lung disease   CHIEF COMPLAINT:   Follow-up CT chest Follow-up pneumonia   HISTORY OF PRESENT ILLNESS    Intermittent shortness of breath Intermittent dyspnea on exertion Symptoms stable over the last 6 months  No signs of infection at this time No shortness of breath at rest  Intermittent sinus congestion  History of pneumonia CT chest October 2019 right middle lobe opacity Consistent with pneumonia given oral antibiotics with Z-Pak   Follow-up CT chest July 2020-complete resolution of the right middle lobe opacity Stable subcentimeter right lower lobe nodule 7 mm and 3 mm nodule Stable over the last 3 years  No acute respiratory distress at this time No fevers no chills no coughing no wheezing  Stays very active Does not use inhaler  PFTs do not show any evidence of obstructive or restrictive lung disease Results reviewed with patient again  No lower  extremity swelling No signs of heart failure  CT images reviewed with patient today   Current Medication:  Current Outpatient Medications:  .  cholecalciferol (VITAMIN D) 1000 units tablet, Take 1,000 Units by mouth daily., Disp: , Rfl:  .  LORazepam (ATIVAN) 0.5 MG tablet, Take 1 mg by mouth as needed. , Disp: , Rfl:  .  Melatonin 5 MG TABS, Take 1 tablet by mouth., Disp: , Rfl:  .  omeprazole (PRILOSEC) 40 MG capsule, Take 40 mg by mouth daily., Disp: , Rfl:  .  tamoxifen (NOLVADEX) 20 MG tablet, Take 1 tablet (20 mg total) by mouth daily., Disp: 90 tablet, Rfl: 4 .  zolpidem (AMBIEN) 5 MG tablet, Take by mouth as needed. , Disp: , Rfl:     ALLERGIES   Propoxyphene, Thioridazine hcl, Bupropion, Citalopram, Duloxetine, and Thioridazine     REVIEW OF SYSTEMS   Review of Systems  Constitutional: Negative for chills, diaphoresis, fever, malaise/fatigue and weight loss.  HENT: Negative for congestion.   Respiratory: Positive for shortness of breath. Negative for cough, hemoptysis, sputum production and wheezing.   Cardiovascular: Negative for chest pain, palpitations, orthopnea and leg swelling.  Gastrointestinal: Negative for heartburn.  Neurological: Negative for weakness.  All other systems reviewed and are negative.   BP 130/78 (BP Location: Right Arm, Cuff Size: Normal)   Pulse 74   Temp 98.2 F (36.8 C) (Skin)   Ht 5\' 7"  (1.702 m)  Wt 151 lb 3.2 oz (68.6 kg)   SpO2 99%   BMI 23.68 kg/m    PHYSICAL EXAM  Physical Exam  Constitutional: She is oriented to person, place, and time. No distress.  HENT:  Mouth/Throat: No oropharyngeal exudate.  Neck: Neck supple.  Cardiovascular: Normal rate, regular rhythm and normal heart sounds.  No murmur heard. Pulmonary/Chest: Effort normal and breath sounds normal. No stridor. No respiratory distress. She has no wheezes.  Musculoskeletal: Normal range of motion.        General: No edema.  Neurological: She is alert and  oriented to person, place, and time. No cranial nerve deficit.  Skin: Skin is warm. She is not diaphoretic.  Psychiatric: She has a normal mood and affect.         IMAGING   CT Chest 02/2016 Pulmonary nodules measure 7 mm or less in size and are stable from 03/28/2015.  CT chest 01/2018 Right middle lobe opacification Findings consistent with pneumonia Im CT chest 1029 ages reveiwed  CT chest 11/08/2018   CT chest 11/08/2018 CT chest 11/08/2018  Images reviewed today  interval resolution of previously seen consolidative airspace opacity of the medial segment right middle lobe.  Stable benign small pulmonary nodule of the right lower lobe measuring 7 mm  Stable benign 3 mm nodule of the right lower, CT scans reviewed for the last 7 years patient does not need any more scans at this time CT chest reviewed for the last several years   ASSESSMENT/PLAN    58 year old pleasant white female seen today for follow-up abnormal CT chest with right middle lobe opacification diagnosed with lobar pneumonia back in October 2019 with complete resolution of right middle lobe opacification on current CT chest of 11/08/2018  Patient does have intermittent shortness of breath most likely related to underlying deconditioned state however there is no evidence of obstructive or restrictive lung disease on pulmonary function testing and her CT chest does not show any significant structural lung damage  Patient has history of breast cancer status post radiation therapy with bilateral lung nodules subcentimeter and associated with chronic allergic rhinitis   History of shortness of breath Based on pulmonary function testing and her lung nodules patient does not have any signs or symptoms of obstructive lung disease nor does she have evidence of interstitial lung disease there is no significant evidence of radiation pneumonitis at this time No indication for prednisone no indication for inhalers at  this time  CT chest no longer needed for interval assessment Nodules are benign   COVID-19 EDUCATION: The signs and symptoms of COVID-19 were discussed with the patient and how to seek care for testing.  The importance of social distancing was discussed today. Hand Washing Techniques and avoid touching face was advised.    CURRENT MEDICATIONS REVIEWED AT LENGTH WITH PATIENT TODAY   Patient satisfied with Plan of action and management. All questions answered  Follow up in 1 year   Courtney Romero Patricia Pesa, M.D.  [facility]  Medical Director [place] Director [facility] Cardio-Pulmonary Department

## 2018-11-14 NOTE — Patient Instructions (Signed)
Exercise as tolerated.

## 2019-01-10 IMAGING — US US EXTREM LOW VENOUS*R*
1 series · 13 of 24 positions shown · non-contrast
Comparison: None.

CLINICAL DATA: Right leg pain for 1 month



[Series 1: us extrem low venous*right* · 13 of 39 slices shown]
[im 1/39]
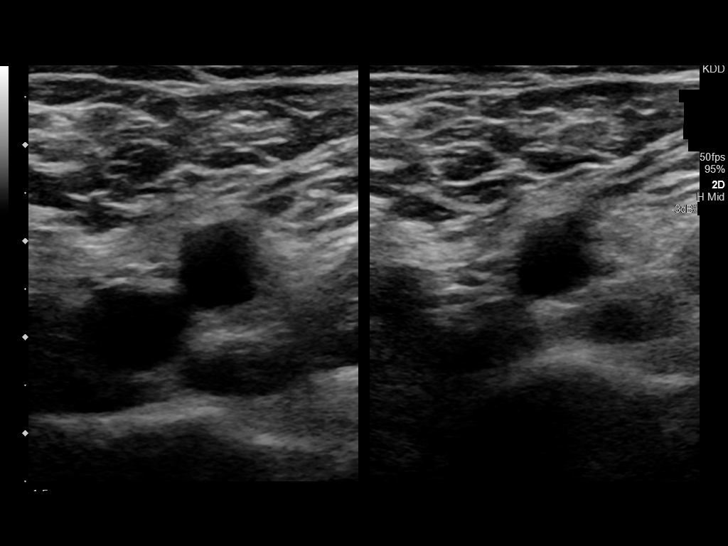
[im 4/39]
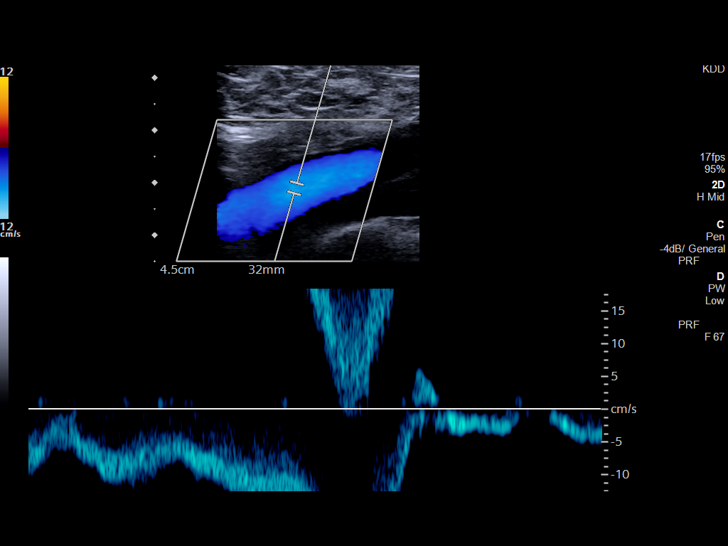
[im 7/39]
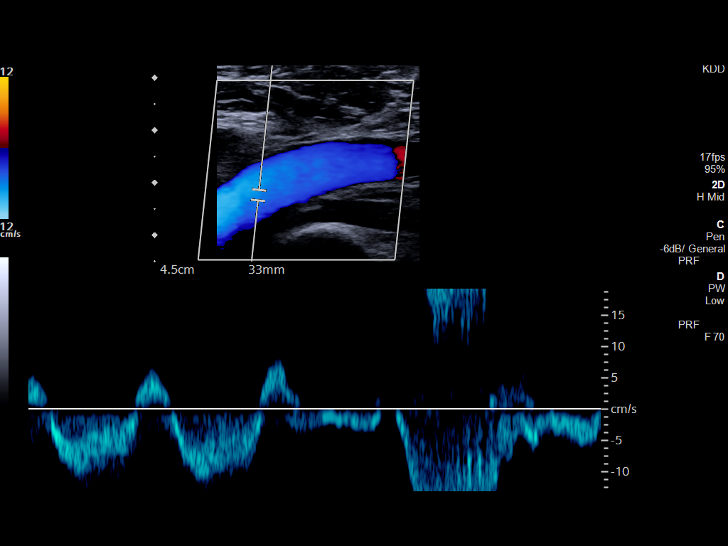
[im 10/39]
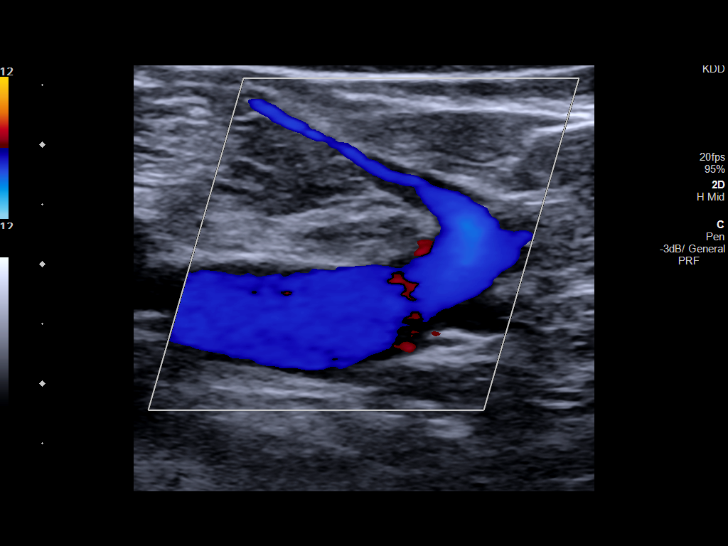
[im 14/39]
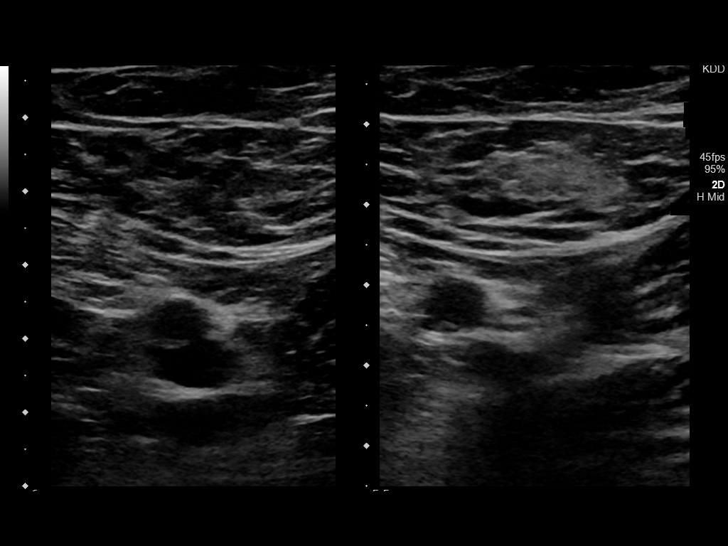
[im 17/39]
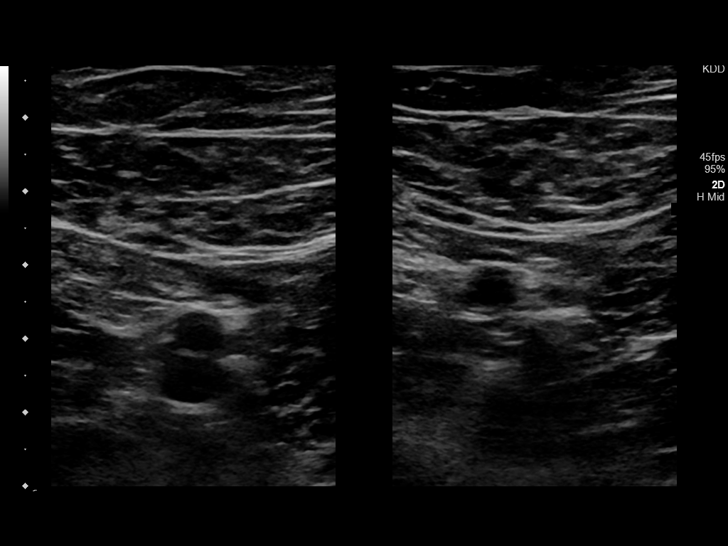
[im 20/39]
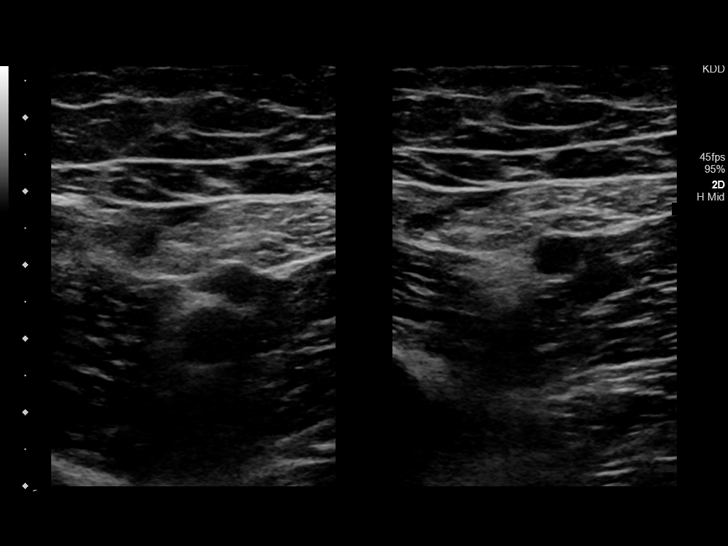
[im 22/39]
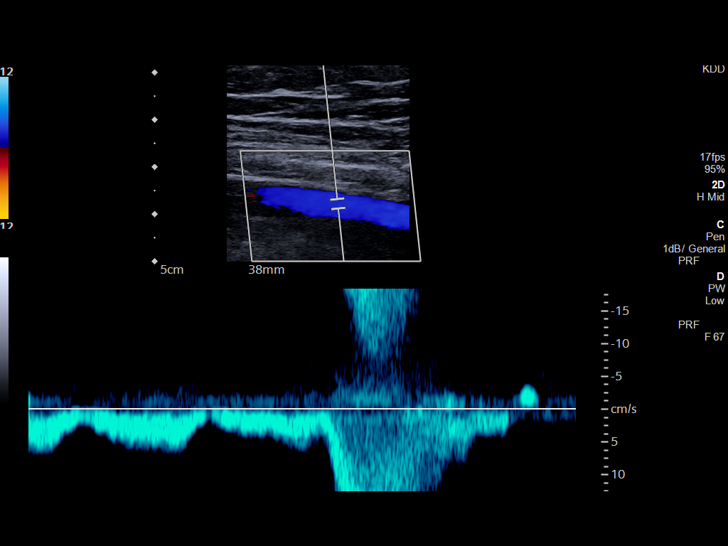
[im 25/39]
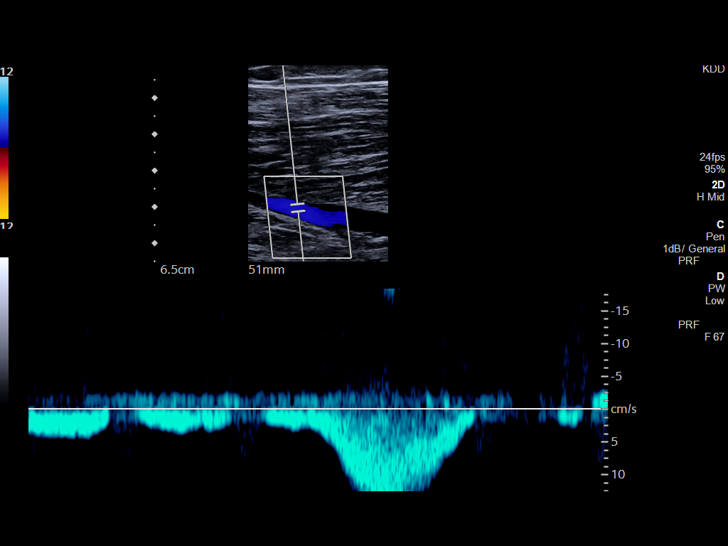
[im 29/39]
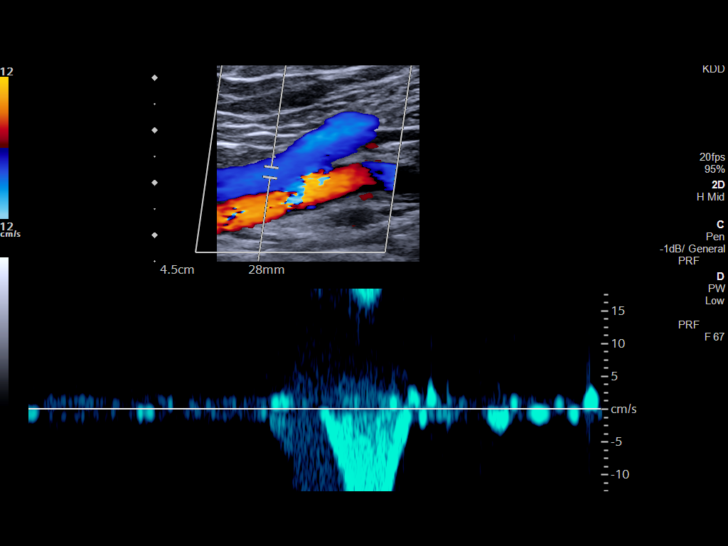
[im 32/39]
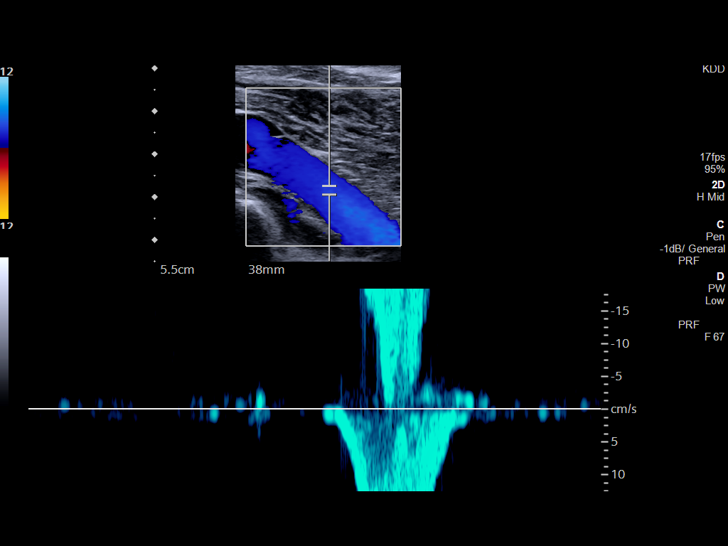
[im 35/39]
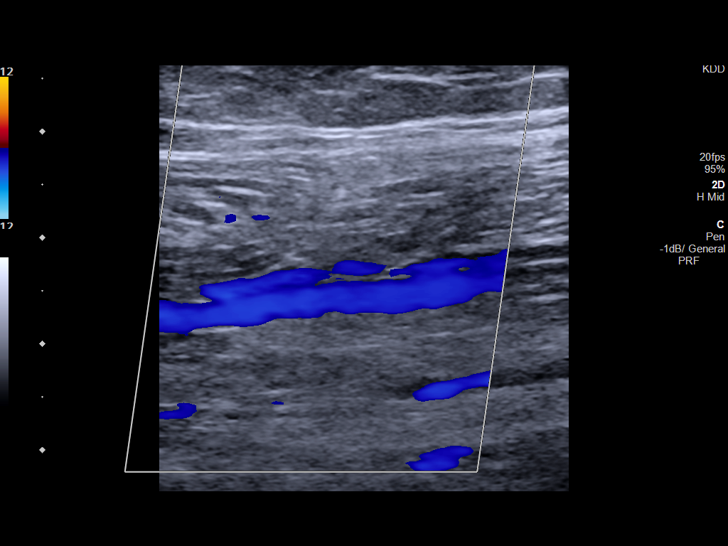
[im 39/39]
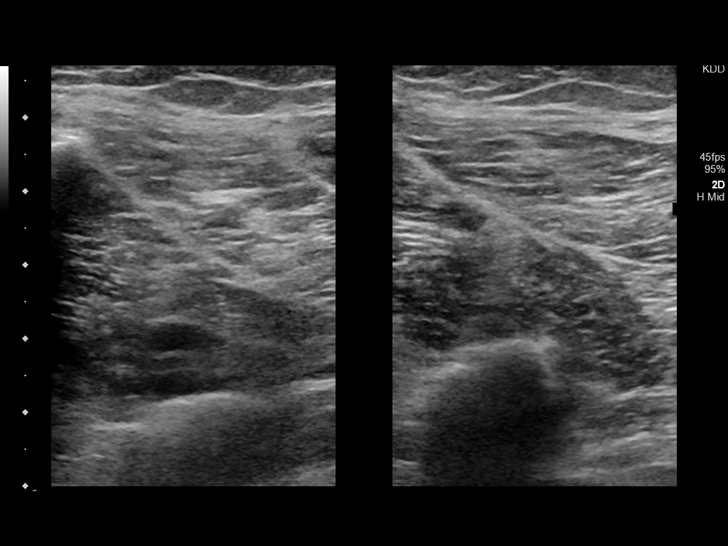

[13 of 24 positions shown; findings below may reference images not displayed]

FINDINGS: Contralateral Common Femoral Vein: Respiratory phasicity is normal
and symmetric with the symptomatic side. No evidence of thrombus.
Normal compressibility.

Common Femoral Vein: No evidence of thrombus. Normal
compressibility, respiratory phasicity and response to augmentation.

Saphenofemoral Junction: No evidence of thrombus. Normal
compressibility and flow on color Doppler imaging.

Profunda Femoral Vein: No evidence of thrombus. Normal
compressibility and flow on color Doppler imaging.

Femoral Vein: No evidence of thrombus. Normal compressibility,
respiratory phasicity and response to augmentation.

Popliteal Vein: No evidence of thrombus. Normal compressibility,
respiratory phasicity and response to augmentation.

Calf Veins: No evidence of thrombus. Normal compressibility and flow
on color Doppler imaging.

Superficial Great Saphenous Vein: No evidence of thrombus. Normal
compressibility.

Venous Reflux:  None.

Other Findings:  None.
IMPRESSION: No evidence of deep venous thrombosis.

## 2019-01-24 ENCOUNTER — Other Ambulatory Visit: Payer: Self-pay | Admitting: Oncology

## 2019-01-24 DIAGNOSIS — Z9889 Other specified postprocedural states: Secondary | ICD-10-CM

## 2019-04-05 ENCOUNTER — Other Ambulatory Visit: Payer: Self-pay

## 2019-04-05 ENCOUNTER — Ambulatory Visit
Admission: RE | Admit: 2019-04-05 | Discharge: 2019-04-05 | Disposition: A | Discharge status: Home / Self Care | Source: Ambulatory Visit | Attending: Oncology | Admitting: Oncology

## 2019-04-05 DIAGNOSIS — Z9889 Other specified postprocedural states: Secondary | ICD-10-CM

## 2019-05-04 ENCOUNTER — Telehealth: Payer: Self-pay | Admitting: Cardiovascular Disease

## 2019-05-04 NOTE — Telephone Encounter (Signed)
Patient made aware of Dr. Arida's response and recommendation with verbalized understanding. 

## 2019-05-04 NOTE — Telephone Encounter (Signed)
Patient states she was diagnosed with Covid-19 on Monday 1/11. States that her HR(125) is high since Sunday 1/10, BP today 84/60., states within 30 minutes has came back to normal. HR when she awoke this morning was 130. Please call to discuss

## 2019-05-04 NOTE — Telephone Encounter (Signed)
Spoke with the patient. Patient was last seen on March 2020 for sob. Her cardiac work up was negative and she was to f/u prn.  Pt sts that she currently feels ok. She denies sob, fever, palpitations, chest pain, swelling, n/v, diaphoresis. She does reports a cough that is infrequent and non- productive. She tested  positive for COVID- 19 on Mon 04/30/19. Pt sts that she was asymptomatic but got tested due to possible exposure.  She has a pulse ox at home and has been checking her O2 sat regularly with readings of 99%. Pt sts that she has been having elevated HRs when moving around 120-130s. Once she sits and relaxes her HR will go below 100 bpm.  This morning when she awoke she felt a little weak and decided to ck her BP. Pt reports 88/65 84bpm. She laid down for a couple of hours on recheck her BP returned to normal 123/82 78 bpm.  I asked her about her fluid intake. Pt sts that she drinks 2-3 bottles of water a day. Advised the patient to liberalize fluids and to try to take in some electrolytes.  Advised her to contact her pcps office to update them and to receive further recommendation. Advised the patient that I will fwd the message to Dr. Fletcher Anon and call back we his recommendation.  Advised the patient to seek emergent care for worsening symptoms especially increased sob or low O2 sat. Patient verbalized understanding and voiced appreciation for the call back.

## 2019-05-04 NOTE — Telephone Encounter (Signed)
Tachycardia seems mild and does not require further evaluation at the present time.  Continue to monitor symptoms.  The stress of having Covid might also be causing some tachycardia.

## 2019-05-17 ENCOUNTER — Telehealth: Payer: Self-pay | Admitting: Oncology

## 2019-05-17 NOTE — Telephone Encounter (Signed)
Rescheduled per 1/28 sch msg. Called and left a msg. Mailing printout

## 2019-05-18 ENCOUNTER — Telehealth: Payer: Self-pay | Admitting: Oncology

## 2019-05-18 NOTE — Telephone Encounter (Signed)
Rescheduled per 1/29 sch msg, pt req. Called and spoke with pt, confirmed 2/18 appt

## 2019-05-21 ENCOUNTER — Ambulatory Visit: Admitting: Oncology

## 2019-05-21 ENCOUNTER — Other Ambulatory Visit

## 2019-06-06 ENCOUNTER — Other Ambulatory Visit

## 2019-06-06 ENCOUNTER — Ambulatory Visit: Admitting: Oncology

## 2019-06-06 NOTE — Progress Notes (Signed)
[place]  Telephone:([phone] Fax:([phone]) [phone]     ID: Courtney Romero DOB: 1960/11/05  MR#: [id number]  KGU#:[id number]  Patient Care Team: [name], MD as PCP - General (Unknown Physician Specialty) Charlena Haub, Virgie Dad, MD as Consulting Physician (Oncology) Erroll Luna, MD as Consulting Physician (General Surgery) Dermatology, [place] Eppie Gibson, MD as Attending Physician (Radiation Oncology) Delice Bison, Charlestine Massed, NP as Nurse Practitioner (Hematology and Oncology) Benjaman Kindler, MD as Consulting Physician (Obstetrics and Gynecology) [name], MD as Consulting Physician (Pulmonary Disease) OTHER MD:  CHIEF COMPLAINT: Estrogen receptor positive breast cancer  CURRENT TREATMENT:  Tamoxifen   INTERVAL HISTORY: Courtney Romero returns today for a follow-up of her estrogen receptor positive breast cancer.   She continues on Tamoxifen.  The main problem she has had from this medication is leg cramps.  She is to get them just about every night, now they are perhaps a couple of times a week.  She did not have cramps before starting tamoxifen.  Hot flashes and vaginal wetness are not a major issue.  Since her last visit, she underwent bilateral diagnostic mammography with tomography at [facility] on 04/05/2019 showing: breast density category B; no evidence of malignancy in either breast.  She also underwent chest CT on 11/08/2018 showing: interval resolution of previously seen infection/inflammation; stable, benign right lower lobe pulmonary nodules; interval reduction in irregular left axilla soft tissue.   REVIEW OF SYSTEMS: Courtney Romero and her husband both had the Covid 50 disease in January although neither of them developed pneumonia.  She had mild symptoms, with some headaches, and diarrhea.  He felt really quite horrible for about 3 weeks she says.  Neither had to be in the hospital.  They understand they still need to get the  vaccine as soon as it becomes available to them.  She is currently not exercising regularly.  She is taking appropriate precautions at work.  A detailed review of systems today was otherwise not contributory   HISTORY OF CURRENT ILLNESS: From the original intake note:   The patient underwent bilateral screening mammography with tomography at the breast center 03/29/2017.  There were some calcifications associated with possible architectural distortion in the left breast.  She was recalled for left unilateral diagnostic mammography with ultrasonography 04/04/2017.  This found the breast density to be category C.  In the upper outer left breast there was a 1.2 cm group of coarse calcifications with persistent distortion.  This was not palpable on exam.  Ultrasound showed a 1.0 cm irregular mass in the left breast at the 11 o'clock position 3 cm from the nipple and also a 1.5 area of posterior acoustic shadowing at the 12 o'clock position 4 cm from the nipple.  The latter was felt to correspond to the area of suspicious calcifications.  Accordingly on 04/08/2017 the patient underwent biopsy of the 2 areas in question. 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  Both biopsy site appeared identical.  Only one prognostic panel was sent, from the 11:00 biopsy, showing estrogen epidural 100% positive, estrogen receptor 100% positive, both with strong staining intensity, with an MIB-105%, and no HER-2 amplification, the signals ratio being 1.35 and the number per cell 2.10.  She underwent breast MRI 04/28/2017 showing, in the left breast, an area of mass and non-masslike enhancement overall measuring up to 4 cm.  In addition there were 3 separate lesions in the right breast suspicious for malignancy, requiring additional biopsy.   The patient's subsequent history is  as detailed below.   PAST MEDICAL HISTORY: Past Medical History:  Diagnosis Date  . Abnormal CAT scan 2011/07/01  . Anxiety   . Cancer ([place])   . Family history of breast cancer   . Family history of pancreatic cancer   . Family history of prostate cancer   . GERD (gastroesophageal reflux disease)   . Hepatitis C July 01, 1999   Followed by Dr. Charlean Sanfilippo at [facility]  . History of radiation therapy 07/26/17- 08/22/17   40.05 Gy directed to the left breast in 15 fractions followed by a boost of 10 Gy in 5 fractions.   . Personal history of radiation therapy   . PONV (postoperative nausea and vomiting)     PAST SURGICAL HISTORY: Past Surgical History:  Procedure Laterality Date  . ABDOMINAL HYSTERECTOMY  2007-07-01  . breast cyst removal  1995  . BREAST LUMPECTOMY Left   . BREAST LUMPECTOMY WITH RADIOACTIVE SEED AND SENTINEL LYMPH NODE BIOPSY Left 06/14/2017   Procedure: LEFT BREAST SEED LOCALIZED LUMPECTOMY (2 SEEDS) AND SENTINEL LYMPH NODE BIOPSY;  Surgeon: Erroll Luna, MD;  Location: [facility];  Service: General;  Laterality: Left;  . COLONOSCOPY  June 30, 2009  . COLONOSCOPY WITH PROPOFOL N/A 12/12/2017   Procedure: COLONOSCOPY WITH PROPOFOL;  Surgeon: Jonathon Bellows, MD;  Location: [facility] ENDOSCOPY;  Service: Gastroenterology;  Laterality: N/A;  . ESOPHAGOGASTRODUODENOSCOPY (EGD) WITH PROPOFOL N/A 12/12/2017   Procedure: ESOPHAGOGASTRODUODENOSCOPY (EGD) WITH PROPOFOL;  Surgeon: Jonathon Bellows, MD;  Location: [facility] ENDOSCOPY;  Service: Gastroenterology;  Laterality: N/A;  . FLEXIBLE SIGMOIDOSCOPY  February 12, 2013   have normal perianal exam, question focal prolapse. 2 small scars in the rectum.  Marland Kitchen HEMORRHOID BANDING  07-01-10   3 times over three months  . SIGMOIDOSCOPY  2011-07-01  . TONSILECTOMY/ADENOIDECTOMY WITH MYRINGOTOMY  remote  . UPPER GI ENDOSCOPY  07-01-11    FAMILY HISTORY Family History  Problem Relation Age of Onset  . Hypertension Mother   . Thyroid disease Mother        Multi problems  .  Coronary artery disease Father   . Heart failure Father   . Atrial fibrillation Father   . Diabetes Brother    As of 2016/06/30, the patient's father is 54 years old. Her mother passed away in 2017/06/30.  The patient has three brothers and no sisters. Her maternal great aunt had breast cancer when she was after 83 and her other maternal great aunt had pancreatic cancer. She has no family history of ovarian cancer. The patient's father had prostate cancer in his 83's.    GYNECOLOGIC HISTORY:  No LMP recorded. Patient has had a hysterectomy.  Menarche: 59 years old GP: She never carried a pregnancy to term LMP: Hysterectomy about 10 years ago, no salpingo-oophorectomy HRT: She was on Estradiol on and off the last three years. 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 She uses gloves when using colorings and other chemicals. Her husband Courtney Romero is retired and was in hosiery. They have one dog. She is not a Banker.     ADVANCED DIRECTIVES: Not in place   HEALTH MAINTENANCE: Social History   Tobacco Use  . Smoking status: Former Smoker    Packs/day: 1.50    Years: 20.00    Pack years: 30.00    Types:  Cigarettes    Quit date: 04/19/1998    Years since quitting: 21.1  . Smokeless tobacco: Never Used  . Tobacco comment: quit smoking in 2000  Substance Use Topics  . Alcohol use: No  . Drug use: No     Colonoscopy: [facility] in [place]  PAP: Status post hysterectomy  Bone density: at 50 --does not know results   Allergies  Allergen Reactions  . Propoxyphene Rash    Uncoded Allergy. Allergen: mellaril, Other Reaction: Other reaction, double vision Uncoded Allergy. Allergen: mellaril, Other Reaction: Other reaction, double vision Uncoded Allergy. Allergen: mellaril, Other Reaction: Other reaction, double vision  . Thioridazine Hcl     REACTION: Reaction not known  . Bupropion Rash and Other (See Comments)    Insomnia Insomnia  . Citalopram  Rash and Other (See Comments)    Lightheaded Lightheaded  . Duloxetine Rash and Other (See Comments)    Insomnia Insomnia  . Thioridazine Rash    REACTION: Reaction not known REACTION: Reaction not known    Current Outpatient Medications  Medication Sig Dispense Refill  . cholecalciferol (VITAMIN D) 1000 units tablet Take 1,000 Units by mouth daily.    Marland Kitchen LORazepam (ATIVAN) 0.5 MG tablet Take 1 mg by mouth as needed.     . Melatonin 5 MG TABS Take 1 tablet by mouth.    Marland Kitchen omeprazole (PRILOSEC) 40 MG capsule Take 40 mg by mouth daily.    . tamoxifen (NOLVADEX) 20 MG tablet Take 1 tablet (20 mg total) by mouth daily. 90 tablet 4  . zolpidem (AMBIEN) 5 MG tablet Take by mouth as needed.      No current facility-administered medications for this visit.    OBJECTIVE: Middle-aged white woman who appears well  Vitals:   06/07/19 1441  BP: 139/90  Pulse: 76  Resp: 18  Temp: 98.2 F (36.8 C)  SpO2: 100%     Body mass index is 23.85 kg/m.   Wt Readings from Last 3 Encounters:  06/07/19 152 lb 4.8 oz (69.1 kg)  11/14/18 151 lb 3.2 oz (68.6 kg)  05/30/18 156 lb (70.8 kg)      ECOG FS:1 - Symptomatic but completely ambulatory  Sclerae unicteric, EOMs intact Wearing a mask No cervical or supraclavicular adenopathy Lungs no rales or rhonchi Heart regular rate and rhythm Abd soft, nontender, positive bowel sounds MSK no focal spinal tenderness, no upper extremity lymphedema Neuro: nonfocal, well oriented, appropriate affect Breasts: The right breast is unremarkable.  The left breast status post lumpectomy and radiation.  There are the expected posttreatment changes.  There is no evidence of disease recurrence.  Both axillae are benign.  LAB RESULTS:  CMP     Component Value Date/Time   NA 144 05/18/2018 1011   K 4.0 05/18/2018 1011   CL 111 05/18/2018 1011   CO2 26 05/18/2018 1011   GLUCOSE 96 05/18/2018 1011   BUN 14 05/18/2018 1011   CREATININE 0.76 05/18/2018 1011    CREATININE 0.91 04/29/2017 1348   CALCIUM 9.1 05/18/2018 1011   PROT 7.0 05/18/2018 1011   ALBUMIN 3.7 05/18/2018 1011   AST 16 05/18/2018 1011   AST 15 04/29/2017 1348   ALT 18 05/18/2018 1011   ALT 14 04/29/2017 1348   ALKPHOS 49 05/18/2018 1011   BILITOT 0.3 05/18/2018 1011   BILITOT 0.7 04/29/2017 1348   GFRNONAA >60 05/18/2018 1011   GFRNONAA >60 04/29/2017 1348   GFRAA >60 05/18/2018 1011   GFRAA >60 04/29/2017  1348    No results found for: TOTALPROTELP, ALBUMINELP, A1GS, A2GS, BETS, BETA2SER, GAMS, MSPIKE, SPEI  No results found for: KPAFRELGTCHN, LAMBDASER, KAPLAMBRATIO  Lab Results  Component Value Date   WBC 4.1 06/07/2019   NEUTROABS 2.5 06/07/2019   HGB 13.1 06/07/2019   HCT 39.2 06/07/2019   MCV 94.7 06/07/2019   PLT 159 06/07/2019   No results found for: LABCA2  No components found for: EGBTDV761  No results for input(s): INR in the last 168 hours.  No results found for: LABCA2  No results found for: YWV371  No results found for: GGY694  No results found for: WNI627  No results found for: CA2729  No components found for: HGQUANT  No results found for: CEA1 / No results found for: CEA1   No results found for: AFPTUMOR  No results found for: CHROMOGRNA   No results found for: HGBA, HGBA2QUANT, HGBFQUANT, HGBSQUAN (Hemoglobinopathy evaluation)   No results found for: LDH  No results found for: IRON, TIBC, IRONPCTSAT (Iron and TIBC)  No results found for: FERRITIN  Urinalysis    Component Value Date/Time   COLORURINE YELLOW (A) 06/12/2017 1911   APPEARANCEUR HAZY (A) 06/12/2017 1911   LABSPEC 1.025 06/12/2017 1911   PHURINE 5.0 06/12/2017 1911   GLUCOSEU NEGATIVE 06/12/2017 1911   HGBUR NEGATIVE 06/12/2017 1911   BILIRUBINUR NEGATIVE 06/12/2017 1911   KETONESUR NEGATIVE 06/12/2017 1911   PROTEINUR NEGATIVE 06/12/2017 1911   UROBILINOGEN 0.2 08/17/2011 0902   NITRITE NEGATIVE 06/12/2017 1911   LEUKOCYTESUR NEGATIVE 06/12/2017 1911     STUDIES: No results found.   ELIGIBLE FOR AVAILABLE RESEARCH PROTOCOL: no  ASSESSMENT: 59 y.o. Courtney Romero, [place] woman status post biopsy of 2 areas in the upper outer quadrant of the left breast 04/08/2017, both showing extensive ductal carcinoma in situ low-grade, but both showing invasive ductal carcinoma (measuring 2-3 mm on the slides), grade 1, estrogen and progesterone receptor positive, HER-2 not amplified, with an MIB-1 of 5%  (1) left lumpectomy and sentinel lymph node sampling 02/26/ 2019 showed only residual ductal carcinoma in situ, low-grade, with negative margins.  (a) a total of 3 axillary lymph nodes were removed  (2) adjuvant radiation 07/26/2017-08/22/2017 Site/dose:40.05 Gy directed to the left breast in 15 fractions followed by a boost of 10 Gy in 5 fractions  (3) genetics testing 07/12/2017 through theCommon Hereditary Cancer Panel offered by [facility] i found no deleterious mutations in APC, ATM, AXIN2, BARD1, BMPR1A, BRCA1, BRCA2, BRIP1, CDH1, CDKN2A (p14ARF), CDKN2A (p16INK4a), CKD4, CHEK2, CTNNA1, DICER1, EPCAM (Deletion/duplication testing only), GREM1 (promoter region deletion/duplication testing only), KIT, MEN1, MLH1, MSH2, MSH3, MSH6, MUTYH, NBN, NF1, NHTL1, PALB2, PDGFRA, PMS2, POLD1, POLE, PTEN, RAD50, RAD51C, RAD51D, SDHB, SDHC, SDHD, SMAD4, SMARCA4. S  (5) tamoxifen started neoadjuvantly 04/29/2017   (6) chest CT 02/15/2018 showed a 12 mm left axillary node not noted on mammography 04/03/2018  (a) repeat chest CT scan 11/08/2018 shows resolution of the previously noted airspace disease, reduction in the left axillary soft tissue, and essentially no evidence of metastatic disease.   PLAN: Courtney Romero is now 2 years out from definitive surgery for her breast cancer with no evidence of disease recurrence.  This is very favorable.  She is tolerating tamoxifen well and the plan will be to continue that a total of 5 years.  We went over her mammogram which shows  density B category, meaning that her breasts are not dense and that is favorable.  We went over her Covid 19 history and she  is planning to receive either the [place] or maternal vaccines when they become available to people her age, probably around April.  Meanwhile she continues to take appropriate precautions  For the leg cramps she is going to try to add some magnesium to her calcium pill.  She knows to call for any other issue that may develop before the next visit.  Total encounter time 25 minutes.*  Tatsuo Musial, Virgie Dad, MD  06/07/19 3:28 PM Medical Oncology and Hematology [facility] [place],  [address] Tel. [phone]    Fax. [phone]   I, Wilburn Mylar, am acting as scribe for Dr. Virgie Dad. Courtney Romero.  I, Lurline Del MD, have reviewed the above documentation for accuracy and completeness, and I agree with the above.   *Total Encounter Time as defined by the Centers for Medicare and Medicaid Services includes, in addition to the face-to-face time of a patient visit (documented in the note above) non-face-to-face time: obtaining and reviewing outside history, ordering and reviewing medications, tests or procedures, care coordination (communications with other health care professionals or caregivers) and documentation in the medical record.

## 2019-06-07 ENCOUNTER — Encounter: Payer: Self-pay | Admitting: Oncology

## 2019-06-07 ENCOUNTER — Other Ambulatory Visit: Payer: Self-pay

## 2019-06-07 ENCOUNTER — Inpatient Hospital Stay: Attending: Oncology

## 2019-06-07 ENCOUNTER — Inpatient Hospital Stay (HOSPITAL_BASED_OUTPATIENT_CLINIC_OR_DEPARTMENT_OTHER): Admitting: Oncology

## 2019-06-07 VITALS — BP 139/90 | HR 76 | Temp 98.2°F | Resp 18 | Ht 67.0 in | Wt 152.3 lb

## 2019-06-07 DIAGNOSIS — C50412 Malignant neoplasm of upper-outer quadrant of left female breast: Secondary | ICD-10-CM

## 2019-06-07 DIAGNOSIS — Z79810 Long term (current) use of selective estrogen receptor modulators (SERMs): Secondary | ICD-10-CM | POA: Insufficient documentation

## 2019-06-07 DIAGNOSIS — I700 Atherosclerosis of aorta: Secondary | ICD-10-CM

## 2019-06-07 DIAGNOSIS — Z170 Estrogen receptor positive status [ER+]: Secondary | ICD-10-CM | POA: Insufficient documentation

## 2019-06-07 DIAGNOSIS — Z923 Personal history of irradiation: Secondary | ICD-10-CM | POA: Insufficient documentation

## 2019-06-07 DIAGNOSIS — C50212 Malignant neoplasm of upper-inner quadrant of left female breast: Secondary | ICD-10-CM | POA: Diagnosis not present

## 2019-06-07 DIAGNOSIS — B182 Chronic viral hepatitis C: Secondary | ICD-10-CM

## 2019-06-07 LAB — CBC WITH DIFFERENTIAL/PLATELET
Abs Immature Granulocytes: 0.01 10*3/uL (ref 0.00–0.07)
Basophils Absolute: 0 10*3/uL (ref 0.0–0.1)
Basophils Relative: 1 %
Eosinophils Absolute: 0.1 10*3/uL (ref 0.0–0.5)
Eosinophils Relative: 1 %
HCT: 39.2 % (ref 36.0–46.0)
Hemoglobin: 13.1 g/dL (ref 12.0–15.0)
Immature Granulocytes: 0 %
Lymphocytes Relative: 32 %
Lymphs Abs: 1.3 10*3/uL (ref 0.7–4.0)
MCH: 31.6 pg (ref 26.0–34.0)
MCHC: 33.4 g/dL (ref 30.0–36.0)
MCV: 94.7 fL (ref 80.0–100.0)
Monocytes Absolute: 0.2 10*3/uL (ref 0.1–1.0)
Monocytes Relative: 6 %
Neutro Abs: 2.5 10*3/uL (ref 1.7–7.7)
Neutrophils Relative %: 60 %
Platelets: 159 10*3/uL (ref 150–400)
RBC: 4.14 MIL/uL (ref 3.87–5.11)
RDW: 13.3 % (ref 11.5–15.5)
WBC: 4.1 10*3/uL (ref 4.0–10.5)
nRBC: 0 % (ref 0.0–0.2)

## 2019-06-07 LAB — COMPREHENSIVE METABOLIC PANEL
ALT: 15 U/L (ref 0–44)
AST: 17 U/L (ref 15–41)
Albumin: 4 g/dL (ref 3.5–5.0)
Alkaline Phosphatase: 49 U/L (ref 38–126)
Anion gap: 10 (ref 5–15)
BUN: 13 mg/dL (ref 6–20)
CO2: 25 mmol/L (ref 22–32)
Calcium: 8.9 mg/dL (ref 8.9–10.3)
Chloride: 108 mmol/L (ref 98–111)
Creatinine, Ser: 0.82 mg/dL (ref 0.44–1.00)
GFR calc Af Amer: >60 mL/min (ref >60)
GFR calc non Af Amer: >60 mL/min (ref >60)
Glucose, Bld: 114 mg/dL — ABNORMAL HIGH (ref 70–99)
Potassium: 3.8 mmol/L (ref 3.5–5.1)
Sodium: 143 mmol/L (ref 135–145)
Total Bilirubin: 0.4 mg/dL (ref 0.3–1.2)
Total Protein: 7.2 g/dL (ref 6.5–8.1)

## 2019-06-07 MED ORDER — TAMOXIFEN CITRATE 20 MG PO TABS: 20.0000 mg | ORAL_TABLET | Freq: Every day | ORAL | 5 refills | Status: DC

## 2019-06-07 MED ORDER — TAMOXIFEN CITRATE 20 MG PO TABS: 20.0000 mg | ORAL_TABLET | Freq: Every day | ORAL | 4 refills | Status: DC

## 2019-06-08 ENCOUNTER — Telehealth: Payer: Self-pay | Admitting: Oncology

## 2019-06-08 ENCOUNTER — Other Ambulatory Visit: Payer: Self-pay | Admitting: *Deleted

## 2019-06-08 MED ORDER — TAMOXIFEN CITRATE 20 MG PO TABS: 20.0000 mg | ORAL_TABLET | Freq: Every day | ORAL | 5 refills | Status: DC

## 2019-06-08 NOTE — Telephone Encounter (Signed)
I left a message regarding schedule  

## 2019-06-14 ENCOUNTER — Telehealth: Payer: Self-pay

## 2019-06-14 NOTE — Telephone Encounter (Signed)
Patient would like a call back to discuss some symptoms that she has been having

## 2019-06-15 NOTE — Telephone Encounter (Addendum)
Spoke with pt regarding her GI symptoms. Pt states she's had a mix of diarrhea and very soft stools for the past week. Pt was originally scheduled to see Dr. Vicente Males in April but I have offered pt an appointment for next Wednesday, she agrees.

## 2019-06-20 ENCOUNTER — Ambulatory Visit (INDEPENDENT_AMBULATORY_CARE_PROVIDER_SITE_OTHER): Admitting: Gastroenterology

## 2019-06-20 ENCOUNTER — Other Ambulatory Visit: Payer: Self-pay

## 2019-06-20 ENCOUNTER — Encounter: Payer: Self-pay | Admitting: Gastroenterology

## 2019-06-20 VITALS — BP 135/94 | HR 83 | Temp 98.3°F | Wt 153.1 lb

## 2019-06-20 DIAGNOSIS — R197 Diarrhea, unspecified: Secondary | ICD-10-CM

## 2019-06-20 DIAGNOSIS — R1013 Epigastric pain: Secondary | ICD-10-CM

## 2019-06-20 MED ORDER — SUCRALFATE 1 GM/10ML PO SUSP: 1.0000 g | Freq: Three times a day (TID) | ORAL | 2 refills | Status: DC

## 2019-06-20 NOTE — Progress Notes (Signed)
Is APP ok ?  You only have a virtual for that time frame.

## 2019-06-20 NOTE — Progress Notes (Signed)
Jonathon Bellows MD, MRCP(U.K) [address]  [place]  [place], [place] [address]  Main: [phone]  Fax: [phone]   Primary Care Physician: [name], MD  Primary Gastroenterologist:  Dr. Jonathon Bellows   Diarrhea.   HPI: Courtney Romero is a 59 y.o. female    Summary of history :  Initially referred and seen on 10/12/2017.  At that point of time she was seen for hemorrhoids and a colonoscopy.  History of banding of hemorrhoids many years back.  Performed by colorectal surgeon.  In by an ENT over 2 years back for issues with swallowing.feels like something in her throat all day long . She says she was told she had an inflamed larynx. Did not help. Some heartburn. No issues with swallowing. She is a Theme park manager. Always feels it. Always clears her throat. Some post nasal drip. Has tried flonase and zyrtec which did not help.  She has had colon polyps in the past and is due to for a surveillance colonoscopy.    Interval history   10/12/2017-06/20/2019  12/12/2017: EGD: Few polyps seen in the gastric body otherwise normal esophagus seen.  Biopsies of the polyp revealed fundic gland polyps.  Biopsies of the esophagus were normal with no evidence of eosinophilic esophagitis.  He also underwent a colonoscopy on the same day.  Hemorrhoids were seen on perianal exam.  2 sessile polyps were seen in the sigmoid and transverse colon 1 of which was a sessile serrated polyp.  Otherwise a normal procedure.  He says her swallowing is okay. Says in January she had COVID-19 infection and since then she has had loose stools.  Up to 4 times a day.  Gas and bloating.  Some epigastric discomfort radiating to the center of her chest.  She finds it hard to describe the nature of the discomfort.  Does not radiate to her arms or to her jaw.  Does not particularly get worse with exertion although she says she has not paid attention to the same.  She has a family history of coronary artery  disease in her parents.  She says that she had cardiac evaluation over a year back and had a negative stress test.  I can feel the results of an echocardiogram in 2020 which shows normal ejection fraction.  She is on a PPI.  Current Outpatient Medications  Medication Sig Dispense Refill  . cholecalciferol (VITAMIN D) 1000 units tablet Take 1,000 Units by mouth daily.    Marland Kitchen LORazepam (ATIVAN) 0.5 MG tablet Take 1 mg by mouth as needed.     . Melatonin 5 MG TABS Take 1 tablet by mouth.    Marland Kitchen omeprazole (PRILOSEC) 40 MG capsule Take 40 mg by mouth daily.    . tamoxifen (NOLVADEX) 20 MG tablet Take 1 tablet (20 mg total) by mouth daily. 90 tablet 5  . zolpidem (AMBIEN) 5 MG tablet Take by mouth as needed.      No current facility-administered medications for this visit.    Allergies as of 06/20/2019 - Review Complete 11/14/2018  Allergen Reaction Noted  . Propoxyphene Rash 02/06/2015  . Thioridazine hcl    . Bupropion Rash and Other (See Comments) 04/05/2012  . Citalopram Rash and Other (See Comments) 12/13/2013  . Duloxetine Rash and Other (See Comments) 04/05/2012  . Thioridazine Rash 02/24/2015    ROS:  General: Negative for anorexia, weight loss, fever, chills, fatigue, weakness. ENT: Negative for hoarseness, difficulty swallowing , nasal congestion. CV: Negative for  chest pain, angina, palpitations, dyspnea on exertion, peripheral edema.  Respiratory: Negative for dyspnea at rest, dyspnea on exertion, cough, sputum, wheezing.  GI: See history of present illness. GU:  Negative for dysuria, hematuria, urinary incontinence, urinary frequency, nocturnal urination.  Endo: Negative for unusual weight change.    Physical Examination:   There were no vitals taken for this visit.  General: Well-nourished, well-developed in no acute distress.  Eyes: No icterus. Conjunctivae pink. Abdomen: Bowel sounds are normal, nontender, nondistended, no hepatosplenomegaly or masses, no abdominal  bruits or hernia , no rebound or guarding.   Extremities: No lower extremity edema. No clubbing or deformities. Neuro: Alert and oriented x 3.  Grossly intact. Skin: Warm and dry, no jaundice.   Psych: Alert and cooperative, normal mood and affect.   Imaging Studies: No results found.  Assessment and Plan:   Courtney Romero is a 59 y.o. y/o female here to see me for loose stools ongoing since she developed COVID-19 in January 2021.  She also has some epigastric discomfort radiating to her chest.  She is not able to clearly describe the nature of the chest pain and does not hence fit either a pattern of gastric or cardiac abnormalities.  Does not really see if he gets worse when she eats or relieved by bowel movement.  Plan 1.  Commence on Carafate 4 times daily 2.  Continue PPI 3.  Check stool for infection C. difficile.  She consumes coffee mate the 4 times a day I have told her to stop the same as it can cause bloating and diarrhea. 4.  I will discuss with Dr. Fletcher Anon if she requires further cardiac evaluation.  If not I can discuss with her gallbladder evaluation at her next visit as she is already had endoscopy evaluation.  Presently she is not keen on any gallbladder evaluation.  Other thoughts include evaluation for gas and bloating.  Telephone visit in 3 weeks.   Dr Jonathon Bellows  MD,MRCP [facility]) Follow up in 3 weeks telephone visit    Addendum   Discussed with Dr. Fletcher Anon and no urgent cardiac work-up is recommended at this point of time.  Suggestion was for complete GI work-up and then refer back to cf. at some point a cardiac cath may be warranted.  I will inform the pain.  At her follow-up visit we will consider gallbladder evaluation if not better

## 2019-06-22 NOTE — Progress Notes (Signed)
Lmov to schedule with Arida only .  Holding open slot

## 2019-06-25 LAB — GI PROFILE, STOOL, PCR

## 2019-06-25 LAB — CALPROTECTIN, FECAL: Calprotectin, Fecal: 68 ug/g (ref 0–120)

## 2019-06-26 ENCOUNTER — Encounter: Payer: Self-pay | Admitting: Gastroenterology

## 2019-06-28 ENCOUNTER — Ambulatory Visit (HOSPITAL_COMMUNITY)
Admission: RE | Admit: 2019-06-28 | Discharge: 2019-06-28 | Disposition: A | Discharge status: Home / Self Care | Source: Ambulatory Visit | Attending: Internal Medicine | Admitting: Internal Medicine

## 2019-06-28 ENCOUNTER — Encounter (HOSPITAL_COMMUNITY): Payer: Self-pay | Admitting: Internal Medicine

## 2019-06-28 ENCOUNTER — Other Ambulatory Visit: Payer: Self-pay

## 2019-06-28 VITALS — BP 116/80 | HR 78 | Wt 153.4 lb

## 2019-06-28 DIAGNOSIS — Z79810 Long term (current) use of selective estrogen receptor modulators (SERMs): Secondary | ICD-10-CM | POA: Insufficient documentation

## 2019-06-28 DIAGNOSIS — Z803 Family history of malignant neoplasm of breast: Secondary | ICD-10-CM | POA: Insufficient documentation

## 2019-06-28 DIAGNOSIS — I2510 Atherosclerotic heart disease of native coronary artery without angina pectoris: Secondary | ICD-10-CM | POA: Insufficient documentation

## 2019-06-28 DIAGNOSIS — Z833 Family history of diabetes mellitus: Secondary | ICD-10-CM | POA: Diagnosis not present

## 2019-06-28 DIAGNOSIS — Z888 Allergy status to other drugs, medicaments and biological substances status: Secondary | ICD-10-CM | POA: Diagnosis not present

## 2019-06-28 DIAGNOSIS — Z923 Personal history of irradiation: Secondary | ICD-10-CM | POA: Insufficient documentation

## 2019-06-28 DIAGNOSIS — Z79899 Other long term (current) drug therapy: Secondary | ICD-10-CM | POA: Diagnosis not present

## 2019-06-28 DIAGNOSIS — Z8249 Family history of ischemic heart disease and other diseases of the circulatory system: Secondary | ICD-10-CM | POA: Insufficient documentation

## 2019-06-28 DIAGNOSIS — Z886 Allergy status to analgesic agent status: Secondary | ICD-10-CM | POA: Diagnosis not present

## 2019-06-28 DIAGNOSIS — Z8349 Family history of other endocrine, nutritional and metabolic diseases: Secondary | ICD-10-CM | POA: Diagnosis not present

## 2019-06-28 DIAGNOSIS — Z800 Family history of malignant neoplasm of digestive organs: Secondary | ICD-10-CM | POA: Insufficient documentation

## 2019-06-28 DIAGNOSIS — F419 Anxiety disorder, unspecified: Secondary | ICD-10-CM | POA: Insufficient documentation

## 2019-06-28 DIAGNOSIS — K2100 Gastro-esophageal reflux disease with esophagitis, without bleeding: Secondary | ICD-10-CM

## 2019-06-28 DIAGNOSIS — Z853 Personal history of malignant neoplasm of breast: Secondary | ICD-10-CM | POA: Diagnosis not present

## 2019-06-28 DIAGNOSIS — K219 Gastro-esophageal reflux disease without esophagitis: Secondary | ICD-10-CM | POA: Insufficient documentation

## 2019-06-28 DIAGNOSIS — Z8042 Family history of malignant neoplasm of prostate: Secondary | ICD-10-CM | POA: Diagnosis not present

## 2019-06-28 DIAGNOSIS — R079 Chest pain, unspecified: Secondary | ICD-10-CM | POA: Diagnosis not present

## 2019-06-28 DIAGNOSIS — Z87891 Personal history of nicotine dependence: Secondary | ICD-10-CM | POA: Insufficient documentation

## 2019-06-28 DIAGNOSIS — R0789 Other chest pain: Secondary | ICD-10-CM | POA: Diagnosis not present

## 2019-06-28 MED ORDER — ASPIRIN EC 81 MG PO TBEC: 81.0000 mg | DELAYED_RELEASE_TABLET | Freq: Every day | ORAL | 3 refills | Status: DC

## 2019-06-28 NOTE — Patient Instructions (Signed)
Your provider requests you have a Coronary CTA.   START Aspirin 81mg   Follow up as needed

## 2019-06-28 NOTE — Progress Notes (Signed)
ADVANCED HF CLINIC CONSULT NOTE  Primary Care: Frazier Richards. MD Primary Cardiologist: New  HPI:  59 y/o woman with h/o GERD, HCV treated with Harvoni, left breast cancer s/p treatment 2018-2019 (lumpectomy, XRT, no chemo) who presents for further evaluation of chest pain.   No h/o cardiac disease. No HTN, HL.   Had echo and ETT/Myoview in 3/20 for SOB. EF 55-65%. Walked 9 min. Normal perfusion  Echo EF 60-65%   CT chest 7/20 notable for 3v coronary plaque.   Had [place] in January 2021. Symptomatic with HA, + nausea and diarrhea. No fever, SOB or CP.  2-3 weeks ago started having subcostal CP. Says chest feels tight. Feels like it is there all the time. Not necessarily worse with walking. The other day had a really severe episode for 1-2 hours then went away. No associated diaphoresis, n/v. Saw GI but no testing yet.      Review of Systems: [y] = yes, [ ]  = no   General: Weight gain [ ] ; Weight loss [ ] ; Anorexia [ ] ; Fatigue [ ] ; Fever [ ] ; Chills [ ] ; Weakness [ ]   Cardiac: Chest pain/pressure Blue.Reese ]; Resting SOB [ ] ; Exertional SOB [ ] ; Orthopnea [ ] ; Pedal Edema [ ] ; Palpitations [ ] ; Syncope [ ] ; Presyncope [ ] ; Paroxysmal nocturnal dyspnea[ ]   Pulmonary: Cough [ y]; Wheezing[ ] ; Hemoptysis[ ] ; Sputum [ ] ; Snoring [ ]   GI: Vomiting[ ] ; Dysphagia[ ] ; Melena[ ] ; Hematochezia [ ] ; Heartburn[y ]; Abdominal pain [ ] ; Constipation [ ] ; Diarrhea [ ] ; BRBPR [ ]   GU: Hematuria[ ] ; Dysuria [ ] ; Nocturia[ ]   Vascular: Pain in legs with walking [ ] ; Pain in feet with lying flat [ ] ; Non-healing sores [ ] ; Stroke [ ] ; TIA [ ] ; Slurred speech [ ] ;  Neuro: Headaches[ ] ; Vertigo[ ] ; Seizures[ ] ; Paresthesias[ ] ;Blurred vision [ ] ; Diplopia [ ] ; Vision changes [ ]   Ortho/Skin: Arthritis [ y]; Joint pain Blue.Reese ]; Muscle pain [ ] ; Joint swelling [ ] ; Back Pain [ ] ; Rash [ ]   Psych: Depression[ ] ; Anxiety[y ]  Heme: Bleeding problems [ ] ; Clotting disorders [ ] ; Anemia [ ]   Endocrine: Diabetes [ ] ;  Thyroid dysfunction[ ]    Past Medical History:  Diagnosis Date  . Abnormal CAT scan 2013  . Anxiety   . Cancer ([place])   . Family history of breast cancer   . Family history of pancreatic cancer   . Family history of prostate cancer   . GERD (gastroesophageal reflux disease)   . Hepatitis C 2001   Followed by Dr. Charlean Sanfilippo at [facility]  . History of radiation therapy 07/26/17- 08/22/17   40.05 Gy directed to the left breast in 15 fractions followed by a boost of 10 Gy in 5 fractions.   . Personal history of radiation therapy   . PONV (postoperative nausea and vomiting)     Current Outpatient Medications  Medication Sig Dispense Refill  . Calcium Carbonate (CALCIUM 600 PO) Take by mouth.    . cholecalciferol (VITAMIN D) 1000 units tablet Take 1,000 Units by mouth daily.    Marland Kitchen LORazepam (ATIVAN) 0.5 MG tablet Take 1 mg by mouth as needed.     . Melatonin 5 MG TABS Take 1 tablet by mouth.    Marland Kitchen omeprazole (PRILOSEC) 40 MG capsule Take 40 mg by mouth daily.    . tamoxifen (NOLVADEX) 20 MG tablet Take 1 tablet (20 mg total) by mouth daily. 90 tablet 5  .  zolpidem (AMBIEN) 5 MG tablet Take by mouth as needed.      No current facility-administered medications for this encounter.    Allergies  Allergen Reactions  . Propoxyphene Rash    Uncoded Allergy. Allergen: mellaril, Other Reaction: Other reaction, double vision Uncoded Allergy. Allergen: mellaril, Other Reaction: Other reaction, double vision Uncoded Allergy. Allergen: mellaril, Other Reaction: Other reaction, double vision  . Thioridazine Hcl     REACTION: Reaction not known  . Bupropion Rash and Other (See Comments)    Insomnia Insomnia  . Citalopram Rash and Other (See Comments)    Lightheaded Lightheaded  . Duloxetine Rash and Other (See Comments)    Insomnia Insomnia  . Thioridazine Rash    REACTION: Reaction not known REACTION: Reaction not known      Social History   Socioeconomic History  . Marital status:  Married    Spouse name: Not on file  . Number of children: Not on file  . Years of education: Not on file  . Highest education level: Not on file  Occupational History  . Occupation: Full Time  Tobacco Use  . Smoking status: Former Smoker    Packs/day: 1.50    Years: 20.00    Pack years: 30.00    Types: Cigarettes    Quit date: 04/19/1998    Years since quitting: 21.2  . Smokeless tobacco: Never Used  . Tobacco comment: quit smoking in 2000  Substance and Sexual Activity  . Alcohol use: No  . Drug use: No  . Sexual activity: Yes  Other Topics Concern  . Not on file  Social History Narrative   Regular exercise: Yes   Social Determinants of Health   Financial Resource Strain:   . Difficulty of Paying Living Expenses:   Food Insecurity:   . Worried About Charity fundraiser in the Last Year:   . Arboriculturist in the Last Year:   Transportation Needs:   . Film/video editor (Medical):   Marland Kitchen Lack of Transportation (Non-Medical):   Physical Activity:   . Days of Exercise per Week:   . Minutes of Exercise per Session:   Stress:   . Feeling of Stress :   Social Connections:   . Frequency of Communication with Friends and Family:   . Frequency of Social Gatherings with Friends and Family:   . Attends Religious Services:   . Active Member of Clubs or Organizations:   . Attends Archivist Meetings:   Marland Kitchen Marital Status:   Intimate Partner Violence:   . Fear of Current or Ex-Partner:   . Emotionally Abused:   Marland Kitchen Physically Abused:   . Sexually Abused:       Family History  Problem Relation Age of Onset  . Hypertension Mother   . Thyroid disease Mother        Multi problems  . Coronary artery disease Father   . Heart failure Father   . Atrial fibrillation Father   . Diabetes Brother     Vitals:   06/28/19 1041  BP: 116/80  Pulse: 78  SpO2: 98%  Weight: 69.6 kg (153 lb 6 oz)    PHYSICAL EXAM: General:  Well appearing. No respiratory  difficulty HEENT: normal Neck: supple. no JVD. Carotids 2+ bilat; no bruits. No lymphadenopathy or thryomegaly appreciated. Cor: PMI nondisplaced. Regular rate & rhythm. No rubs, gallops or murmurs. Lungs: clear Abdomen: soft, nontender, nondistended. No hepatosplenomegaly. No bruits or masses. Good bowel sounds. Extremities: no  cyanosis, clubbing, rash, edema Neuro: alert & oriented x 3, cranial nerves grossly intact. moves all 4 extremities w/o difficulty. Affect pleasant.  ECG: NSR 82 No ST-T wave abnormalities. Personally reviewed   ASSESSMENT & PLAN:  1. Chest pain/pressure with 3v coronary calcification on previous CT - has typical and atypical symptoms - given progressive nature of symptoms and evidence of CAD on chest CT we discussed need for definitive evaluation of her coronary anatomy with cath vs CTA. We have elected to proceed with cardiac CTA - start ASA and will likely need statin  2. GERD - appears well controlled. Continue PPI   Glori Bickers, MD  10:12 PM

## 2019-07-04 ENCOUNTER — Telehealth: Payer: Self-pay | Admitting: Gastroenterology

## 2019-07-04 NOTE — Telephone Encounter (Signed)
Dr. Havery Moros, this pt requested to transfer GI care to you due to recommendation from her cardiologist.  She is currently a patient of Dr. Vicente Males at [facility] and is not satisfied with her GI care.    She requested to be seen in [place].  Pt would like to be seen for diarrhea and abdominal pain.  Records are in Epic for review.  Please advise scheduling.

## 2019-07-04 NOTE — Telephone Encounter (Signed)
I would be happy to see her, can book her for a routine new patient office visit. Thanks

## 2019-07-05 ENCOUNTER — Encounter: Payer: Self-pay | Admitting: Gastroenterology

## 2019-07-05 NOTE — Telephone Encounter (Signed)
A message was left for pt to call back to schedule.  °

## 2019-07-20 ENCOUNTER — Telehealth (HOSPITAL_COMMUNITY): Payer: Self-pay | Admitting: Emergency Medicine

## 2019-07-20 ENCOUNTER — Encounter (HOSPITAL_COMMUNITY): Payer: Self-pay

## 2019-07-20 ENCOUNTER — Other Ambulatory Visit (HOSPITAL_COMMUNITY): Payer: Self-pay | Admitting: Emergency Medicine

## 2019-07-20 DIAGNOSIS — R079 Chest pain, unspecified: Secondary | ICD-10-CM

## 2019-07-20 MED ORDER — METOPROLOL TARTRATE 100 MG PO TABS: ORAL_TABLET | ORAL | 0 refills | Status: DC

## 2019-07-20 NOTE — Telephone Encounter (Signed)
Reaching out to patient to offer assistance regarding upcoming cardiac imaging study; pt verbalizes understanding of appt date/time, parking situation and where to check in, pre-test NPO status and medications ordered, and verified current allergies; name and call back number provided for further questions should they arise Courtney Bond RN Navigator Cardiac Imaging [place] [facility] [phone] office [phone] cell   Pt instructed to pick up medication (metoprolol) from pharm, what its for, when to take it, etc. Pt verbalized understanding.   Pt also states she has poison ivy rash to arms after working in yard. (beginning to clear up) [name]

## 2019-07-23 ENCOUNTER — Other Ambulatory Visit: Payer: Self-pay

## 2019-07-23 ENCOUNTER — Encounter: Admitting: *Deleted

## 2019-07-23 ENCOUNTER — Encounter (HOSPITAL_COMMUNITY): Payer: Self-pay

## 2019-07-23 ENCOUNTER — Ambulatory Visit (HOSPITAL_COMMUNITY)
Admission: RE | Admit: 2019-07-23 | Discharge: 2019-07-23 | Disposition: A | Discharge status: Home / Self Care | Source: Ambulatory Visit | Attending: Internal Medicine | Admitting: Internal Medicine

## 2019-07-23 DIAGNOSIS — R079 Chest pain, unspecified: Secondary | ICD-10-CM

## 2019-07-23 DIAGNOSIS — Z006 Encounter for examination for normal comparison and control in clinical research program: Secondary | ICD-10-CM

## 2019-07-23 MED ORDER — NITROGLYCERIN 0.4 MG SL SUBL
SUBLINGUAL_TABLET | SUBLINGUAL | Status: AC
  Filled 2019-07-23: qty 2

## 2019-07-23 MED ORDER — NITROGLYCERIN 0.4 MG SL SUBL
0.8000 mg | SUBLINGUAL_TABLET | Freq: Once | SUBLINGUAL | Status: AC
  Administered 2019-07-23: 0.8 mg via SUBLINGUAL

## 2019-07-23 MED ORDER — IOHEXOL 350 MG/ML SOLN
80.0000 mL | Freq: Once | INTRAVENOUS | Status: AC | PRN
  Administered 2019-07-23: 80 mL via INTRAVENOUS

## 2019-07-23 MED ORDER — MIDAZOLAM HCL 2 MG/2ML IJ SOLN
INTRAMUSCULAR | Status: AC
  Filled 2019-07-23: qty 4

## 2019-07-23 NOTE — Research (Signed)
CADFEM Informed Consent                  Subject Name:   Courtney Shiroma. Romero   Subject met inclusion and exclusion criteria.  The informed consent form, study requirements and expectations were reviewed with the subject and questions and concerns were addressed prior to the signing of the consent form.  The subject verbalized understanding of the trial requirements.  The subject agreed to participate in the CADFEM trial and signed the informed consent.  The informed consent was obtained prior to performance of any protocol-specific procedures for the subject.  A copy of the signed informed consent was given to the subject and a copy was placed in the subject's medical record.   [place] Porchea Charrier, Research Assistant  07/23/2019 09:10

## 2019-07-25 ENCOUNTER — Ambulatory Visit (HOSPITAL_COMMUNITY)
Admission: RE | Admit: 2019-07-25 | Discharge: 2019-07-25 | Disposition: A | Discharge status: Home / Self Care | Source: Ambulatory Visit | Attending: Internal Medicine | Admitting: Internal Medicine

## 2019-07-25 DIAGNOSIS — R079 Chest pain, unspecified: Secondary | ICD-10-CM | POA: Diagnosis not present

## 2019-07-26 ENCOUNTER — Encounter (HOSPITAL_COMMUNITY): Payer: Self-pay

## 2019-07-26 ENCOUNTER — Telehealth (HOSPITAL_COMMUNITY): Payer: Self-pay | Admitting: *Deleted

## 2019-07-26 MED ORDER — ROSUVASTATIN CALCIUM 40 MG PO TABS: 40.0000 mg | ORAL_TABLET | Freq: Every day | ORAL | 3 refills | Status: DC

## 2019-07-26 NOTE — Telephone Encounter (Signed)
-----   Message from Jolaine Artist, MD sent at 07/25/2019  4:51 PM EDT ----- Start crestor 40. F/u with me for televist in 1 month.

## 2019-07-26 NOTE — Telephone Encounter (Signed)
Pt aware and agreeable, she states Dr Haroldine Laws called her last night to discuss. RX for crestor sent in, f/u appt sch for 5/3

## 2019-07-31 ENCOUNTER — Ambulatory Visit: Admitting: Cardiovascular Disease

## 2019-08-01 ENCOUNTER — Ambulatory Visit: Admitting: Gastroenterology

## 2019-08-01 ENCOUNTER — Other Ambulatory Visit (HOSPITAL_COMMUNITY): Payer: Self-pay | Admitting: *Deleted

## 2019-08-01 MED ORDER — ROSUVASTATIN CALCIUM 40 MG PO TABS: 40.0000 mg | ORAL_TABLET | Freq: Every day | ORAL | 3 refills | Status: DC

## 2019-08-14 ENCOUNTER — Other Ambulatory Visit (INDEPENDENT_AMBULATORY_CARE_PROVIDER_SITE_OTHER)

## 2019-08-14 ENCOUNTER — Encounter: Payer: Self-pay | Admitting: Gastroenterology

## 2019-08-14 ENCOUNTER — Ambulatory Visit (INDEPENDENT_AMBULATORY_CARE_PROVIDER_SITE_OTHER): Admitting: Gastroenterology

## 2019-08-14 VITALS — BP 120/80 | HR 84 | Temp 98.3°F | Ht 67.0 in | Wt 155.0 lb

## 2019-08-14 DIAGNOSIS — R109 Unspecified abdominal pain: Secondary | ICD-10-CM | POA: Diagnosis not present

## 2019-08-14 DIAGNOSIS — R140 Abdominal distension (gaseous): Secondary | ICD-10-CM

## 2019-08-14 DIAGNOSIS — R1010 Upper abdominal pain, unspecified: Secondary | ICD-10-CM

## 2019-08-14 DIAGNOSIS — R194 Change in bowel habit: Secondary | ICD-10-CM

## 2019-08-14 LAB — IGA: IgA: 77 mg/dL (ref 68–378)

## 2019-08-14 LAB — H. PYLORI ANTIBODY, IGG: H Pylori IgG: NEGATIVE

## 2019-08-14 MED ORDER — CITRUCEL PO POWD: 1.0000 | Freq: Every day | ORAL | Status: DC

## 2019-08-14 NOTE — Progress Notes (Signed)
HPI :  59 year old female with a remote history of hep C, history of breast cancer status post radiation therapy, history of GERD, referred by Dr. Manya Silvas for chest/epigastric pain, and altered bowel function.  She has previously been seen for some of these complaints by Dr. Jonathon Bellows and she wishes to have a second opinion.   She states she had COVID-19 in January.  Since that time she has had altered bowel function.  She states she has not had a solid stool since COVID-19.  Stools are mostly mushy and soft, often small volume and she is going 2-3 times per day.  She has occasional urgency but not significant.  No nocturnal symptoms.  She does have internal hemorrhoids and states she has scant bleeding from irritated hemorrhoids at times.  She has not really tried anything for her bowels yet.  She had a negative GI pathogen panel and a borderline elevated fecal calprotectin to the 60s.  She last had a colonoscopy with Dr. Vicente Males in August 2019.  She had hemorrhoids noted on that exam, 2 small polyps removed, 1 of which was sessile serrated.  Otherwise exam was normal.  She otherwise endorses epigastric/chest discomfort that has been going on for the past few months.  She states this comes and goes.  She will feel it daily, can often linger for several minutes to hours at a time.  She states she feels quite bloated with this as well.  She denies any relation of her symptoms to eating at all.  She definitively states that eating does not make her symptoms worse at all.  She has occasional nausea but no vomiting.  She states she is not sure if anxiety is related to some of the symptoms.  She does have a history of pyrosis and reflux.  She takes omeprazole 40 mg once a day.  She states her reflux symptoms are well controlled and this epigastric to chest discomfort does not seem like reflux to her.  She was given a trial of Carafate which has not helped anything.  She has stopped taking that.  She  denies any NSAIDs.  She denies any dysphagia.  No new medications in relation to her symptoms.  She had an EGD in August 2019 with Dr. Ambrose Pancoast.  A few benign gastric body polyps, otherwise normal esophagus.  Biopsies of the esophagus were normal without evidence of eosinophilic esophagitis, gastric polyps were fundic gland polyps.  No evidence of Barrett's esophagus.  She has seen cardiology for her chest discomfort.  She had an echocardiogram in March 2020 with an EF of 60 to 65%.  She recently had a follow-up cardiac CT showing a high coronary calcium score with extensive plaque in the coronaries possible moderate stenosis in the proximal LAD.  She is due to follow-up with cardiology considering cardiac catheterization.  The patient denies much exertional symptoms related to this but is concerned her chest discomfort could be angina.  Review of records she had an ultrasound in 2013 showing no gallstones.  She did have a CT scan of her abdomen in 2015 showing questionably thickened jejunum.    Past Medical History:  Diagnosis Date  . Abnormal CAT scan 2013  . Anxiety   . Cancer ([place])   . Family history of breast cancer   . Family history of pancreatic cancer   . Family history of prostate cancer   . GERD (gastroesophageal reflux disease)   . Hepatitis C 2001   Followed by  Dr. Charlean Sanfilippo at [facility]  . History of radiation therapy 07/26/17- 08/22/17   40.05 Gy directed to the left breast in 15 fractions followed by a boost of 10 Gy in 5 fractions.   . Personal history of radiation therapy   . PONV (postoperative nausea and vomiting)      Past Surgical History:  Procedure Laterality Date  . ABDOMINAL HYSTERECTOMY  2009  . breast cyst removal  1995  . BREAST LUMPECTOMY Left   . BREAST LUMPECTOMY WITH RADIOACTIVE SEED AND SENTINEL LYMPH NODE BIOPSY Left 06/14/2017   Procedure: LEFT BREAST SEED LOCALIZED LUMPECTOMY (2 SEEDS) AND SENTINEL LYMPH NODE BIOPSY;  Surgeon: Erroll Luna, MD;  Location:  [facility];  Service: General;  Laterality: Left;  . COLONOSCOPY  2011  . COLONOSCOPY WITH PROPOFOL N/A 12/12/2017   Procedure: COLONOSCOPY WITH PROPOFOL;  Surgeon: Jonathon Bellows, MD;  Location: [facility] ENDOSCOPY;  Service: Gastroenterology;  Laterality: N/A;  . ESOPHAGOGASTRODUODENOSCOPY (EGD) WITH PROPOFOL N/A 12/12/2017   Procedure: ESOPHAGOGASTRODUODENOSCOPY (EGD) WITH PROPOFOL;  Surgeon: Jonathon Bellows, MD;  Location: [facility] ENDOSCOPY;  Service: Gastroenterology;  Laterality: N/A;  . FLEXIBLE SIGMOIDOSCOPY  February 12, 2013   have normal perianal exam, question focal prolapse. 2 small scars in the rectum.  Marland Kitchen HEMORRHOID BANDING  2012   3 times over three months  . SIGMOIDOSCOPY  2013  . TONSILECTOMY/ADENOIDECTOMY WITH MYRINGOTOMY  remote  . UPPER GI ENDOSCOPY  2013   Family History  Problem Relation Age of Onset  . Hypertension Mother   . Thyroid disease Mother        Multi problems  . Coronary artery disease Father   . Heart failure Father   . Atrial fibrillation Father   . Diabetes Brother    Social History   Tobacco Use  . Smoking status: Former Smoker    Packs/day: 1.50    Years: 20.00    Pack years: 30.00    Types: Cigarettes    Quit date: 04/19/1998    Years since quitting: 21.3  . Smokeless tobacco: Never Used  . Tobacco comment: quit smoking in 2000  Substance Use Topics  . Alcohol use: No  . Drug use: No   Current Outpatient Medications  Medication Sig Dispense Refill  . aspirin EC 81 MG tablet Take 1 tablet (81 mg total) by mouth daily. 90 tablet 3  . Calcium Carbonate (CALCIUM 600 PO) Take by mouth.    . cholecalciferol (VITAMIN D) 1000 units tablet Take 1,000 Units by mouth daily.    Marland Kitchen LORazepam (ATIVAN) 0.5 MG tablet Take 1 mg by mouth as needed.     . Melatonin 5 MG TABS Take 1 tablet by mouth.    Marland Kitchen omeprazole (PRILOSEC) 40 MG capsule Take 40 mg by mouth daily.    . rosuvastatin (CRESTOR) 40 MG tablet Take 1 tablet (40 mg total) by mouth daily. 90  tablet 3  . tamoxifen (NOLVADEX) 20 MG tablet Take 1 tablet (20 mg total) by mouth daily. 90 tablet 5  . zolpidem (AMBIEN) 5 MG tablet Take by mouth as needed.      No current facility-administered medications for this visit.   Allergies  Allergen Reactions  . Propoxyphene Rash    Uncoded Allergy. Allergen: mellaril, Other Reaction: Other reaction, double vision Uncoded Allergy. Allergen: mellaril, Other Reaction: Other reaction, double vision Uncoded Allergy. Allergen: mellaril, Other Reaction: Other reaction, double vision  . Thioridazine Hcl     REACTION: Reaction not known  . Bupropion Rash  and Other (See Comments)    Insomnia Insomnia  . Citalopram Rash and Other (See Comments)    Lightheaded Lightheaded  . Duloxetine Rash and Other (See Comments)    Insomnia Insomnia  . Thioridazine Rash    REACTION: Reaction not known REACTION: Reaction not known     Review of Systems: All systems reviewed and negative except where noted in HPI.    CT CORONARY MORPH W/CTA COR W/SCORE W/CA W/CM &/OR WO/CM  Addendum Date: 07/24/2019   ADDENDUM REPORT: 07/24/2019 13:49 CLINICAL DATA:  Chest pain EXAM: Cardiac CTA MEDICATIONS: Sub lingual nitro. 4mg  x 2 TECHNIQUE: The patient was scanned on a Siemens [id number] slice scanner. Gantry rotation speed was 250 msecs. Collimation was 0.6 mm. A 100 kV prospective scan was triggered in the ascending thoracic aorta at 35-75% of the R-R interval. Average HR during the scan was 60 bpm. The 3D data set was interpreted on a dedicated work station using MPR, MIP and VRT modes. A total of 80cc of contrast was used. FINDINGS: Non-cardiac: See separate report from [facility]. Pulmonary veins drain normally to the left atrium. Calcium Score: 566 Agatston units. Coronary Arteries: Right dominant with no anomalies LM: No plaque or stenosis. LAD system: Ostial/proximal calcified plaque, mild (<50%) stenosis. Calcified plaque at take-off of D2, possible moderate  (51-69%) stenosis. Circumflex system: Calcified plaque mid LCx, mild (<50%) stenosis. RCA system: Mixed plaque mid RCA, mild (<50%) stenosis. Calcified plaque distal RCA, mild (<50%) stenosis. Calcified plaque in the PLV, minimal stenosis. IMPRESSION: 1. Coronary artery calcium score 566 Agatston units. This places the patient in the 99th percentile for age and gender, suggesting high risk for future cardiac events. 2. Extensive plaque in the coronaries, mainly mild stenosis but possible moderate stenosis in the proximal LAD at the take-off of D2. Will send for FFR. Dalton Mclean Electronically Signed   By: Loralie Champagne M.D.   On: 07/24/2019 13:49   Result Date: 07/24/2019 EXAM: OVER-READ INTERPRETATION  CT CHEST The following report is an over-read performed by radiologist Dr. Zetta Bills of [facility], [place] on 07/23/2019. This over-read does not include interpretation of cardiac or coronary anatomy or pathology. The coronary calcium score/coronary CTA interpretation by the cardiologist is attached. COMPARISON:  CT chest 11/08/2018 FINDINGS: Vascular: Please see dedicated coronary CT a and calcium scoring. No pericardial effusion. Mediastinum/Nodes: Limited imaging of the mediastinum and imaged portions of the esophagus are unremarkable. Lungs/Pleura: Basilar atelectasis. No consolidation or pleural effusion. Upper Abdomen: Incidental imaging of upper abdominal contents is unremarkable. Musculoskeletal: No acute or destructive bone finding. Postoperative changes present in the left breast partially visualize related to prior lumpectomy. IMPRESSION: Partial visualization of changes of left lumpectomy. Please see dedicated coronary CT angiography of the coronary arteries and calcium scoring. Electronically Signed: By: Zetta Bills M.D. On: 07/23/2019 11:12   CT CORONARY FRACTIONAL FLOW RESERVE DATA PREP  Result Date: 07/25/2019 CLINICAL DATA:  Chest pain EXAM: CT FFR MEDICATIONS: No additional  medications. TECHNIQUE: The coronary CTA was sent for FFR. FINDINGS: No significant drop in FFR was seen in the coronary tree. IMPRESSION: No evidence for hemodynamically significant stenosis. Dalton Mclean Electronically Signed   By: Loralie Champagne M.D.   On: 07/25/2019 15:00   Lab Results  Component Value Date   WBC 4.1 06/07/2019   HGB 13.1 06/07/2019   HCT 39.2 06/07/2019   MCV 94.7 06/07/2019   PLT 159 06/07/2019    Lab Results  Component Value Date   CREATININE  0.82 06/07/2019   BUN 13 06/07/2019   NA 143 06/07/2019   K 3.8 06/07/2019   CL 108 06/07/2019   CO2 25 06/07/2019    Lab Results  Component Value Date   ALT 15 06/07/2019   AST 17 06/07/2019   ALKPHOS 49 06/07/2019   BILITOT 0.4 06/07/2019      Physical Exam: BP 120/80   Pulse 84   Temp 98.3 F (36.8 C)   Ht 5\' 7"  (1.702 m)   Wt 155 lb (70.3 kg)   BMI 24.28 kg/m  Constitutional: Pleasant,well-developed, female in no acute distress. HEENT: Normocephalic and atraumatic. Conjunctivae are normal. No scleral icterus. Neck supple.  Cardiovascular: Normal rate, regular rhythm.  Pulmonary/chest: Effort normal and breath sounds normal. No wheezing, rales or rhonchi. Abdominal: Soft, nondistended, nontender.  There are no masses palpable. No hepatomegaly. Extremities: no edema Lymphadenopathy: No cervical adenopathy noted. Neurological: Alert and oriented to person place and time. Skin: Skin is warm and dry. No rashes noted. Psychiatric: Normal mood and affect. Behavior is normal.   ASSESSMENT AND PLAN: 59 year old female here for new patient consultation regarding the following:  Epigastric to chest discomfort - this does not sound consistent with reflux, biliary colic also seems unlikely, she has no prandial association.  She has been on PPI and add Carafate which provided no benefit, PUD seems less likely as well, prior EGD showed no gastritis or ulcers and she denies any NSAIDs or other risk factors for  that.  Unclear what is driving the symptoms.  She does have extensive coronary calcifications and there is consideration for cardiac catheterization, unclear if this could be anginal symptoms but will discuss with her cardiologist during her follow-up.  I discussed options from our perspective to evaluate this.  She had a CT scan done a few years ago showing some questionable jejunal thickening and given unclear etiology based on work-up thus far, recommend CT abdomen pelvis to further evaluate this.  I will also screen her for H. pylori with a serology as well as celiac disease with serologic evaluation given her prominent bloating.  She was in agreement with this and wanted to proceed.  I will touch base her cardiologist pending findings to determine if cardiac catheterization is a consideration given the cardiac CT.  She will continue her PPI in the interim.  She agreed, all questions answered.  Altered bowel habits - patient has had some persistent soft/loose stools with mild increase in frequency since she had COVID-19.  Borderline elevated fecal calprotectin, infectious work-up negative.  Discussed options with her.  She is wanting to avoid using Imodium, she is concerned about constipation.  We will give her a trial of Citrucel to see if that can provide some regularity and minimize risk of bloating, and counseled her on a low FODMAP diet to see if that will help things.  Not sure if this is a postinfectious/functional change, however if symptoms persist despite regimen we may need to consider colonoscopy to rule out microscopic colitis.  Will await CT scan initially to assess for bowel thickening etc.  [place] Cellar, MD [facility]  CC: [name], MD

## 2019-08-14 NOTE — Patient Instructions (Signed)
If you are age 58 or older, your body mass index should be between 23-30. Your Body mass index is 24.28 kg/m. If this is out of the aforementioned range listed, please consider follow up with your Primary Care Provider.  If you are age 3 or younger, your body mass index should be between 19-25. Your Body mass index is 24.28 kg/m. If this is out of the aformentioned range listed, please consider follow up with your Primary Care Provider.   You have been scheduled for a CT scan of the abdomen and pelvis at [facility], 1st floor Radiology. You are scheduled on Wednesday, 08-22-19 at 8:30am. You should arrive 15 minutes prior to your appointment time for registration. Please follow the written instructions below on the day of your exam:  WARNING: IF YOU ARE ALLERGIC TO IODINE/X-RAY DYE, PLEASE NOTIFY RADIOLOGY IMMEDIATELY AT [phone]! YOU WILL BE GIVEN A 13 HOUR PREMEDICATION PREP.  1) Do not eat  anything after 4:30am (4 hours prior to your test) 2) You have been given 2 bottles of oral contrast to drink. The solution may taste better if refrigerated, but do NOT add ice or any other liquid to this solution. Shake well before drinking.    Drink 1 bottle of contrast @ 6:30am (2 hours prior to your exam)  Drink 1 bottle of contrast @ 7:30am (1 hour prior to your exam)  You may take any medications as prescribed with a small amount of water, if necessary. If you take any of the following medications: METFORMIN, GLUCOPHAGE, GLUCOVANCE, AVANDAMET, RIOMET, FORTAMET, [place] MET, JANUMET, GLUMETZA or METAGLIP, you MAY be asked to HOLD this medication 48 hours AFTER the exam.  The purpose of you drinking the oral contrast is to aid in the visualization of your intestinal tract. The contrast solution may cause some diarrhea. Depending on your individual set of symptoms, you may also receive an intravenous injection of x-ray contrast/dye. Plan on being at [facility] for 30 minutes or longer,  depending on the type of exam you are having performed.  This test typically takes 30-45 minutes to complete.  If you have any questions regarding your exam or if you need to reschedule, you may call Radiology between the hours of 8:00 am and 5:00 pm, Monday-Friday at [phone]  ______________________________________________________________________   Due to recent changes in healthcare laws, you may see the results of your imaging and laboratory studies on MyChart before your provider has had a chance to review them.  We understand that in some cases there may be results that are confusing or concerning to you. Not all laboratory results come back in the same time frame and the provider may be waiting for multiple results in order to interpret others.  Please give [place] 48 hours in order for your provider to thoroughly review all the results before contacting the office for clarification of your results.   Please go to the lab in the basement of our building to have lab work done as you leave today. Hit "B" for basement when you get on the elevator.  When the doors open the lab is on your left.  We will call you with the results. Thank you.  Please purchase the following medications over the counter and take as directed: Citrucel: Take as directed, daily  We are giving you a Low FOD-Map diet to follow today.  Continue omeprazole 40 mg: Take once daily   Thank you for entrusting me with your care and for choosing  [employer], Dr. [place]

## 2019-08-15 LAB — TISSUE TRANSGLUTAMINASE, IGA: (tTG) Ab, IgA: 1 U/mL

## 2019-08-20 ENCOUNTER — Encounter (HOSPITAL_COMMUNITY): Payer: Self-pay

## 2019-08-20 ENCOUNTER — Ambulatory Visit (HOSPITAL_COMMUNITY)
Admission: RE | Admit: 2019-08-20 | Discharge: 2019-08-20 | Disposition: A | Discharge status: Home / Self Care | Source: Ambulatory Visit | Attending: Internal Medicine | Admitting: Internal Medicine

## 2019-08-20 ENCOUNTER — Other Ambulatory Visit: Payer: Self-pay

## 2019-08-20 DIAGNOSIS — I2510 Atherosclerotic heart disease of native coronary artery without angina pectoris: Secondary | ICD-10-CM | POA: Diagnosis not present

## 2019-08-20 DIAGNOSIS — R079 Chest pain, unspecified: Secondary | ICD-10-CM | POA: Diagnosis not present

## 2019-08-20 DIAGNOSIS — K2100 Gastro-esophageal reflux disease with esophagitis, without bleeding: Secondary | ICD-10-CM

## 2019-08-20 NOTE — Addendum Note (Signed)
Encounter addended by: Valeda Malm, RN on: 08/20/2019 4:29 PM  Actions taken: Order list changed, Diagnosis association updated, Clinical Note Signed

## 2019-08-20 NOTE — H&P (View-Only) (Signed)
Cardiology TeleHealth Note  Due to national recommendations of social distancing due to [place] 19, Audio/video telehealth visit is felt to be most appropriate for this patient at this time.  See MyChart message from today for patient consent regarding telehealth for Courtney Romero. The patient was identified personally using two identifiers.   Date:  08/20/2019   ID:  Courtney Romero, DOB Sep 23, 1960, MRN [id number]  Location: Home  Provider location:  [facility] Type of Visit: Established patient  PCP:  [name], MD  Cardiologist:  No primary care provider on file. Primary HF: Emmerie Battaglia  Chief Complaint: CAD   History of Present Illness:  Courtney Romero is a 59 y/o woman with h/o GERD, HCV treated with Harvoni, left breast cancer s/p treatment 2018-2019 (lumpectomy, XRT, no chemo) who presents for further evaluation of chest pain.   No h/o cardiac disease. No HTN, HL.   Had echo and ETT/Myoview in 3/20 for SOB. EF 55-65%. Walked 9 min. Normal perfusion  Echo EF 60-65%   CT chest 7/20 notable for 3v coronary plaque.   Had [place] in January 2021.  Coronary CTA 4/21 LM: No plaque or stenosis. LAD system: Ostial/proximal calcified plaque, mild (<50%) stenosis. Calcified plaque at take-off of D2, possible moderate (51-69%) stenosis. LCX: Calcified plaque mid LCx, mild (<50%) stenosis. RCA system: Mixed plaque mid RCA, mild (<50%) stenosis. Calcified plaque distal RCA, mild (<50%) stenosis. Calcified plaque in the PLV, minimal stenosis.  IMPRESSION: 1. Coronary artery calcium score 566 Agatston units. This places the patient in the 58th percentile for age and gender, suggesting high risk for future cardiac events.  2. Extensive plaque in the coronaries, mainly mild stenosis but possible moderate stenosis in the proximal LAD at the take-off of D2.  She presents via Engineer, civil (consulting) for a telehealth visit today. Feels ok.  Has seen GI for epigastric and lower CP. They were concerned for ischemic  Pain. Says it happens every day. Can last an hour or a few minutes.  Going to gym and walking on the TM. Walks 2 miles without CP. Very worried about having a heart attack.   Courtney Romero denies symptoms worrisome for COVID 19.   Past Medical History:  Diagnosis Date  . Abnormal CAT scan 2013  . Anxiety   . Cancer ([place])   . Family history of breast cancer   . Family history of pancreatic cancer   . Family history of prostate cancer   . GERD (gastroesophageal reflux disease)   . Hepatitis C 2001   Followed by Dr. Charlean Sanfilippo at [facility]  . History of radiation therapy 07/26/17- 08/22/17   40.05 Gy directed to the left breast in 15 fractions followed by a boost of 10 Gy in 5 fractions.   . Personal history of radiation therapy   . PONV (postoperative nausea and vomiting)    Past Surgical History:  Procedure Laterality Date  . ABDOMINAL HYSTERECTOMY  2009  . breast cyst removal  1995  . BREAST LUMPECTOMY Left   . BREAST LUMPECTOMY WITH RADIOACTIVE SEED AND SENTINEL LYMPH NODE BIOPSY Left 06/14/2017   Procedure: LEFT BREAST SEED LOCALIZED LUMPECTOMY (2 SEEDS) AND SENTINEL LYMPH NODE BIOPSY;  Surgeon: Erroll Luna, MD;  Location: [place];  Service: General;  Laterality: Left;  . COLONOSCOPY  2011  . COLONOSCOPY WITH PROPOFOL N/A 12/12/2017   Procedure: COLONOSCOPY WITH PROPOFOL;  Surgeon: Jonathon Bellows, MD;  Location[facility] ENDOSCOPY;  Service: Gastroenterology;  Laterality: N/A;  . ESOPHAGOGASTRODUODENOSCOPY (EGD) WITH PROPOFOL N/A 12/12/2017   Procedure: ESOPHAGOGASTRODUODENOSCOPY (EGD) WITH PROPOFOL;  Surgeon: Jonathon Bellows, MD;  Location: [facility] ENDOSCOPY;  Service: Gastroenterology;  Laterality: N/A;  . FLEXIBLE SIGMOIDOSCOPY  February 12, 2013   have normal perianal exam, question focal prolapse. 2 small scars in the rectum.  Marland Kitchen HEMORRHOID BANDING  2012   3 times over three months  .  SIGMOIDOSCOPY  2013  . TONSILECTOMY/ADENOIDECTOMY WITH MYRINGOTOMY  remote  . UPPER GI ENDOSCOPY  2013     Current Outpatient Medications  Medication Sig Dispense Refill  . aspirin EC 81 MG tablet Take 1 tablet (81 mg total) by mouth daily. 90 tablet 3  . Calcium Carbonate (CALCIUM 600 PO) Take by mouth.    . cholecalciferol (VITAMIN D) 1000 units tablet Take 1,000 Units by mouth daily.    Marland Kitchen LORazepam (ATIVAN) 0.5 MG tablet Take 1 mg by mouth as needed.     . Melatonin 5 MG TABS Take 1 tablet by mouth.    . methylcellulose (CITRUCEL) oral powder Take 1 packet by mouth daily.    Marland Kitchen omeprazole (PRILOSEC) 40 MG capsule Take 40 mg by mouth daily.    . rosuvastatin (CRESTOR) 40 MG tablet Take 1 tablet (40 mg total) by mouth daily. 90 tablet 3  . tamoxifen (NOLVADEX) 20 MG tablet Take 1 tablet (20 mg total) by mouth daily. 90 tablet 5  . zolpidem (AMBIEN) 5 MG tablet Take by mouth as needed.      No current facility-administered medications for this encounter.    Allergies:   Propoxyphene, Thioridazine hcl, Bupropion, Citalopram, Duloxetine, and Thioridazine   Social History:  The patient  reports that she quit smoking about 21 years ago. Her smoking use included cigarettes. She has a 30.00 pack-year smoking history. She has never used smokeless tobacco. She reports that she does not drink alcohol or use drugs.   Family History:  The patient's family history includes Atrial fibrillation in her father; Coronary artery disease in her father; Diabetes in her brother; Heart failure in her father; Hypertension in her mother; Thyroid disease in her mother.   ROS:  Please see the history of present illness.   All other systems are personally reviewed and negative.   Exam:  (Video/Tele Health Call; Exam is subjective and or/visual.) General:  Speaks in full sentences. No resp difficulty. Lungs: Normal respiratory effort with conversation.  Abdomen: Non-distended per patient report Extremities: Pt  denies edema. Neuro: Alert & oriented x 3.   Recent Labs: 06/07/2019: ALT 15; BUN 13; Creatinine, Ser 0.82; Hemoglobin 13.1; Platelets 159; Potassium 3.8; Sodium 143  Personally reviewed   Wt Readings from Last 3 Encounters:  08/14/19 70.3 kg (155 lb)  06/28/19 69.6 kg (153 lb 6 oz)  06/20/19 69.5 kg (153 lb 2 oz)      ASSESSMENT AND PLAN:  1. Chest pain/pressure with 3v CAD on cardiac CTA - CP with typical and atypical symptoms - CTA coronaries with 3v CAD - Given ongoing CP will schedule coronary angiography for 5/10. Call me or 911 if pain getting worse  - Continue ASA and statin  2. GERD - follows with GI Continue PPI   COVID screen The patient does not have any symptoms that suggest any further testing/ screening at this time.  Social distancing reinforced today.  Recommended follow-up:  As above  Relevant cardiac medications were reviewed at length with the patient today.   The patient does not have concerns  regarding their medications at this time.   The following changes were made today:  As above  Today, I have spent 16 minutes with the patient with telehealth technology discussing the above issues .    Signed, Glori Bickers, MD  08/20/2019 3:54 PM  Advanced Heart Failure [place] [address] Heart and [place] [address] [phone] (office) [phone] (fax)

## 2019-08-20 NOTE — Progress Notes (Signed)
Called patient to review AVS. Per MD, pt scheduled for Left Heart Cath.  All instructions reviewed. Verbalized understanding. AVS sent via mychart. Pt verbalized understanding an appreciation

## 2019-08-20 NOTE — Progress Notes (Signed)
Cardiology TeleHealth Note  Due to national recommendations of social distancing due to [place] 19, Audio/video telehealth visit is felt to be most appropriate for this patient at this time.  See MyChart message from today for patient consent regarding telehealth for Courtney Romero. The patient was identified personally using two identifiers.   Date:  08/20/2019   ID:  Courtney Romero, DOB 1960-07-01, MRN [id number]  Location: Home  Provider location: [place] [facility] Type of Visit: Established patient  PCP:  [name], MD  Cardiologist:  No primary care provider on file. Primary HF: Armanda Forand  Chief Complaint: CAD   History of Present Illness:  Courtney Romero is a 59 y/o woman with h/o GERD, HCV treated with Harvoni, left breast cancer s/p treatment 2018-2019 (lumpectomy, XRT, no chemo) who presents for further evaluation of chest pain.   No h/o cardiac disease. No HTN, HL.   Had echo and ETT/Myoview in 3/20 for SOB. EF 55-65%. Walked 9 min. Normal perfusion  Echo EF 60-65%   CT chest 7/20 notable for 3v coronary plaque.   Had [place] in January 2021.  Coronary CTA 4/21 LM: No plaque or stenosis. LAD system: Ostial/proximal calcified plaque, mild (<50%) stenosis. Calcified plaque at take-off of D2, possible moderate (51-69%) stenosis. LCX: Calcified plaque mid LCx, mild (<50%) stenosis. RCA system: Mixed plaque mid RCA, mild (<50%) stenosis. Calcified plaque distal RCA, mild (<50%) stenosis. Calcified plaque in the PLV, minimal stenosis.  IMPRESSION: 1. Coronary artery calcium score 566 Agatston units. This places the patient in the 99th percentile for age and gender, suggesting high risk for future cardiac events.  2. Extensive plaque in the coronaries, mainly mild stenosis but possible moderate stenosis in the proximal LAD at the take-off of D2.  She presents via Engineer, civil (consulting) for a telehealth visit today. Feels ok.  Has seen GI for epigastric and lower CP. They were concerned for ischemic  Pain. Says it happens every day. Can last an hour or a few minutes.  Going to gym and walking on the TM. Walks 2 miles without CP. Very worried about having a heart attack.   Courtney Romero denies symptoms worrisome for COVID 19.   Past Medical History:  Diagnosis Date  . Abnormal CAT scan 2013  . Anxiety   . Cancer ([place])   . Family history of breast cancer   . Family history of pancreatic cancer   . Family history of prostate cancer   . GERD (gastroesophageal reflux disease)   . Hepatitis C 2001   Followed by Dr. Charlean Sanfilippo at [facility]  . History of radiation therapy 07/26/17- 08/22/17   40.05 Gy directed to the left breast in 15 fractions followed by a boost of 10 Gy in 5 fractions.   . Personal history of radiation therapy   . PONV (postoperative nausea and vomiting)    Past Surgical History:  Procedure Laterality Date  . ABDOMINAL HYSTERECTOMY  2009  . breast cyst removal  1995  . BREAST LUMPECTOMY Left   . BREAST LUMPECTOMY WITH RADIOACTIVE SEED AND SENTINEL LYMPH NODE BIOPSY Left 06/14/2017   Procedure: LEFT BREAST SEED LOCALIZED LUMPECTOMY (2 SEEDS) AND SENTINEL LYMPH NODE BIOPSY;  Surgeon: Erroll Luna, MD;  Location: [place];  Service: General;  Laterality: Left;  . COLONOSCOPY  2011  . COLONOSCOPY WITH PROPOFOL N/A 12/12/2017   Procedure: COLONOSCOPY WITH PROPOFOL;  Surgeon: Jonathon Bellows, MD;  Location: [facility] ENDOSCOPY;  Service: Gastroenterology;  Laterality: N/A;  . ESOPHAGOGASTRODUODENOSCOPY (EGD) WITH PROPOFOL N/A 12/12/2017   Procedure: ESOPHAGOGASTRODUODENOSCOPY (EGD) WITH PROPOFOL;  Surgeon: Jonathon Bellows, MD;  Location: [facility] ENDOSCOPY;  Service: Gastroenterology;  Laterality: N/A;  . FLEXIBLE SIGMOIDOSCOPY  February 12, 2013   have normal perianal exam, question focal prolapse. 2 small scars in the rectum.  Marland Kitchen HEMORRHOID BANDING  2012   3 times over three months  .  SIGMOIDOSCOPY  2013  . TONSILECTOMY/ADENOIDECTOMY WITH MYRINGOTOMY  remote  . UPPER GI ENDOSCOPY  2013     Current Outpatient Medications  Medication Sig Dispense Refill  . aspirin EC 81 MG tablet Take 1 tablet (81 mg total) by mouth daily. 90 tablet 3  . Calcium Carbonate (CALCIUM 600 PO) Take by mouth.    . cholecalciferol (VITAMIN D) 1000 units tablet Take 1,000 Units by mouth daily.    Marland Kitchen LORazepam (ATIVAN) 0.5 MG tablet Take 1 mg by mouth as needed.     . Melatonin 5 MG TABS Take 1 tablet by mouth.    . methylcellulose (CITRUCEL) oral powder Take 1 packet by mouth daily.    Marland Kitchen omeprazole (PRILOSEC) 40 MG capsule Take 40 mg by mouth daily.    . rosuvastatin (CRESTOR) 40 MG tablet Take 1 tablet (40 mg total) by mouth daily. 90 tablet 3  . tamoxifen (NOLVADEX) 20 MG tablet Take 1 tablet (20 mg total) by mouth daily. 90 tablet 5  . zolpidem (AMBIEN) 5 MG tablet Take by mouth as needed.      No current facility-administered medications for this encounter.    Allergies:   Propoxyphene, Thioridazine hcl, Bupropion, Citalopram, Duloxetine, and Thioridazine   Social History:  The patient  reports that she quit smoking about 21 years ago. Her smoking use included cigarettes. She has a 30.00 pack-year smoking history. She has never used smokeless tobacco. She reports that she does not drink alcohol or use drugs.   Family History:  The patient's family history includes Atrial fibrillation in her father; Coronary artery disease in her father; Diabetes in her brother; Heart failure in her father; Hypertension in her mother; Thyroid disease in her mother.   ROS:  Please see the history of present illness.   All other systems are personally reviewed and negative.   Exam:  (Video/Tele Health Call; Exam is subjective and or/visual.) General:  Speaks in full sentences. No resp difficulty. Lungs: Normal respiratory effort with conversation.  Abdomen: Non-distended per patient report Extremities: Pt  denies edema. Neuro: Alert & oriented x 3.   Recent Labs: 06/07/2019: ALT 15; BUN 13; Creatinine, Ser 0.82; Hemoglobin 13.1; Platelets 159; Potassium 3.8; Sodium 143  Personally reviewed   Wt Readings from Last 3 Encounters:  08/14/19 70.3 kg (155 lb)  06/28/19 69.6 kg (153 lb 6 oz)  06/20/19 69.5 kg (153 lb 2 oz)      ASSESSMENT AND PLAN:  1. Chest pain/pressure with 3v CAD on cardiac CTA - CP with typical and atypical symptoms - CTA coronaries with 3v CAD - Given ongoing CP will schedule coronary angiography for 5/10. Call me or 911 if pain getting worse  - Continue ASA and statin  2. GERD - follows with GI Continue PPI   COVID screen The patient does not have any symptoms that suggest any further testing/ screening at this time.  Social distancing reinforced today.  Recommended follow-up:  As above  Relevant cardiac medications were reviewed at length with the patient today.   The patient does not have concerns  regarding their medications at this time.   The following changes were made today:  As above  Today, I have spent 16 minutes with the patient with telehealth technology discussing the above issues .    Signed, Glori Bickers, MD  08/20/2019 3:54 PM  Advanced Heart Failure [place] [address] Heart and [place] [address] [phone] (office) [phone] (fax)

## 2019-08-20 NOTE — Patient Instructions (Addendum)
LEFT HEART CATHETERIZATION AND CORONARY ANGIOGRAPHY   [facility] [place] [id number] [place] [address] Dept: [phone] Loc: [place]  08/20/2019  You are scheduled for a Cardiac Catheterization on Monday, May 10 with Dr. Glori Bickers.  1. Please arrive at the [facility] (Main Entrance A) at [facility]: [address] [place], [place] [address] at 5:30 AM (This time is two hours before your procedure to ensure your preparation). Free valet parking service is available.   Special note: Every effort is made to have your procedure done on time. Please understand that emergencies sometimes delay scheduled procedures.  2. Diet: Do not eat solid foods after midnight.  The patient may have clear liquids until 5am upon the day of the procedure.  3. Labs: SCHEDULED FOR Friday, MAY 7TH, 2021 AT 10:30AM AT [facility].  GARAGE CODE 5008  You will need a pre procedure COVID test      WHEN: MAY 7TH, 2021 AT 11:20AM  WHERE: [facility]                   [place] [address]  This is a drive thru testing site, you will remain in your car. Be sure to get in the line FOR PROCEDURES Once you have been swabbed you will need to remain home in quarantine until you return for your procedure.   4. Medication instructions in preparation for your procedure:  HOLD ALL MORNING MEDICATIONS   Contrast Allergy: No  On the morning of your procedure, take your Aspirin and any morning medicines NOT listed above.  You may use sips of water.  5. Plan for one night stay--bring personal belongings. 6. Bring a current list of your medications and current insurance cards. 7. You MUST have a responsible person to drive you home. 8. Someone MUST be with you the first 24 hours after you arrive home or your discharge will be delayed. 9.  Please wear clothes that are easy to get on and off and wear slip-on shoes.  Thank you for allowing [place] to care for you!   -- [facility]

## 2019-08-22 ENCOUNTER — Other Ambulatory Visit: Payer: Self-pay

## 2019-08-22 ENCOUNTER — Ambulatory Visit (HOSPITAL_COMMUNITY)
Admission: RE | Admit: 2019-08-22 | Discharge: 2019-08-22 | Disposition: A | Discharge status: Home / Self Care | Source: Ambulatory Visit | Attending: Gastroenterology | Admitting: Gastroenterology

## 2019-08-22 ENCOUNTER — Encounter (HOSPITAL_COMMUNITY): Payer: Self-pay

## 2019-08-22 DIAGNOSIS — R109 Unspecified abdominal pain: Secondary | ICD-10-CM | POA: Insufficient documentation

## 2019-08-22 MED ORDER — SODIUM CHLORIDE (PF) 0.9 % IJ SOLN
INTRAMUSCULAR | Status: AC
  Filled 2019-08-22: qty 50

## 2019-08-22 MED ORDER — IOHEXOL 300 MG/ML  SOLN
100.0000 mL | Freq: Once | INTRAMUSCULAR | Status: AC | PRN
  Administered 2019-08-22: 100 mL via INTRAVENOUS

## 2019-08-23 ENCOUNTER — Other Ambulatory Visit (HOSPITAL_COMMUNITY): Payer: Self-pay

## 2019-08-23 DIAGNOSIS — R079 Chest pain, unspecified: Secondary | ICD-10-CM

## 2019-08-23 MED ORDER — SODIUM CHLORIDE 0.9% FLUSH: 3.0000 mL | Freq: Two times a day (BID) | INTRAVENOUS | Status: DC

## 2019-08-24 ENCOUNTER — Other Ambulatory Visit (HOSPITAL_COMMUNITY)
Admission: RE | Admit: 2019-08-24 | Discharge: 2019-08-24 | Disposition: A | Discharge status: Home / Self Care | Source: Ambulatory Visit | Attending: Internal Medicine | Admitting: Internal Medicine

## 2019-08-24 ENCOUNTER — Ambulatory Visit (HOSPITAL_COMMUNITY)
Admission: RE | Admit: 2019-08-24 | Discharge: 2019-08-24 | Disposition: A | Discharge status: Home / Self Care | Source: Ambulatory Visit | Attending: Internal Medicine | Admitting: Internal Medicine

## 2019-08-24 ENCOUNTER — Other Ambulatory Visit: Payer: Self-pay

## 2019-08-24 DIAGNOSIS — Z20822 Contact with and (suspected) exposure to covid-19: Secondary | ICD-10-CM | POA: Diagnosis not present

## 2019-08-24 DIAGNOSIS — Z01812 Encounter for preprocedural laboratory examination: Secondary | ICD-10-CM | POA: Insufficient documentation

## 2019-08-24 DIAGNOSIS — I2510 Atherosclerotic heart disease of native coronary artery without angina pectoris: Secondary | ICD-10-CM

## 2019-08-24 LAB — CBC
HCT: 41.2 % (ref 36.0–46.0)
Hemoglobin: 13.5 g/dL (ref 12.0–15.0)
MCH: 31.6 pg (ref 26.0–34.0)
MCHC: 32.8 g/dL (ref 30.0–36.0)
MCV: 96.5 fL (ref 80.0–100.0)
Platelets: 155 10*3/uL (ref 150–400)
RBC: 4.27 MIL/uL (ref 3.87–5.11)
RDW: 12.7 % (ref 11.5–15.5)
WBC: 3.8 10*3/uL — ABNORMAL LOW (ref 4.0–10.5)
nRBC: 0 % (ref 0.0–0.2)

## 2019-08-24 LAB — BASIC METABOLIC PANEL
Anion gap: 9 (ref 5–15)
BUN: 12 mg/dL (ref 6–20)
CO2: 23 mmol/L (ref 22–32)
Calcium: 9.1 mg/dL (ref 8.9–10.3)
Chloride: 111 mmol/L (ref 98–111)
Creatinine, Ser: 0.9 mg/dL (ref 0.44–1.00)
GFR calc Af Amer: >60 mL/min (ref >60)
GFR calc non Af Amer: >60 mL/min (ref >60)
Glucose, Bld: 105 mg/dL — ABNORMAL HIGH (ref 70–99)
Potassium: 3.8 mmol/L (ref 3.5–5.1)
Sodium: 143 mmol/L (ref 135–145)

## 2019-08-24 LAB — SARS CORONAVIRUS 2 (TAT 6-24 HRS): SARS Coronavirus 2: NEGATIVE

## 2019-08-27 ENCOUNTER — Ambulatory Visit (HOSPITAL_COMMUNITY)
Admission: RE | Admit: 2019-08-27 | Discharge: 2019-08-27 | Disposition: A | Discharge status: Home / Self Care | Attending: Internal Medicine | Admitting: Internal Medicine

## 2019-08-27 ENCOUNTER — Other Ambulatory Visit: Payer: Self-pay

## 2019-08-27 ENCOUNTER — Encounter (HOSPITAL_COMMUNITY)
Admission: RE | Disposition: A | Discharge status: Home / Self Care | Payer: Self-pay | Source: Home / Self Care | Attending: Internal Medicine

## 2019-08-27 DIAGNOSIS — K219 Gastro-esophageal reflux disease without esophagitis: Secondary | ICD-10-CM | POA: Insufficient documentation

## 2019-08-27 DIAGNOSIS — R0789 Other chest pain: Secondary | ICD-10-CM | POA: Diagnosis present

## 2019-08-27 DIAGNOSIS — Z7982 Long term (current) use of aspirin: Secondary | ICD-10-CM | POA: Diagnosis not present

## 2019-08-27 DIAGNOSIS — Z87891 Personal history of nicotine dependence: Secondary | ICD-10-CM | POA: Insufficient documentation

## 2019-08-27 DIAGNOSIS — Z8719 Personal history of other diseases of the digestive system: Secondary | ICD-10-CM | POA: Insufficient documentation

## 2019-08-27 DIAGNOSIS — Z8249 Family history of ischemic heart disease and other diseases of the circulatory system: Secondary | ICD-10-CM | POA: Insufficient documentation

## 2019-08-27 DIAGNOSIS — R079 Chest pain, unspecified: Secondary | ICD-10-CM

## 2019-08-27 DIAGNOSIS — F419 Anxiety disorder, unspecified: Secondary | ICD-10-CM | POA: Diagnosis not present

## 2019-08-27 DIAGNOSIS — I2510 Atherosclerotic heart disease of native coronary artery without angina pectoris: Secondary | ICD-10-CM | POA: Diagnosis not present

## 2019-08-27 DIAGNOSIS — Z79899 Other long term (current) drug therapy: Secondary | ICD-10-CM | POA: Diagnosis not present

## 2019-08-27 HISTORY — PX: LEFT HEART CATH AND CORONARY ANGIOGRAPHY: CATH118249

## 2019-08-27 HISTORY — DX: Atherosclerotic heart disease of native coronary artery without angina pectoris: I25.10

## 2019-08-27 SURGERY — LEFT HEART CATH AND CORONARY ANGIOGRAPHY
Anesthesia: LOCAL

## 2019-08-27 MED ORDER — MIDAZOLAM HCL 2 MG/2ML IJ SOLN
INTRAMUSCULAR | Status: DC | PRN
  Administered 2019-08-27: 2 mg via INTRAVENOUS
  Administered 2019-08-27: 1 mg via INTRAVENOUS

## 2019-08-27 MED ORDER — ASPIRIN 81 MG PO CHEW: 81.0000 mg | CHEWABLE_TABLET | ORAL | Status: DC

## 2019-08-27 MED ORDER — HEPARIN (PORCINE) IN NACL 1000-0.9 UT/500ML-% IV SOLN
INTRAVENOUS | Status: AC
  Filled 2019-08-27: qty 1000

## 2019-08-27 MED ORDER — VERAPAMIL HCL 2.5 MG/ML IV SOLN
INTRAVENOUS | Status: AC
  Filled 2019-08-27: qty 2

## 2019-08-27 MED ORDER — LIDOCAINE HCL (PF) 1 % IJ SOLN
INTRAMUSCULAR | Status: DC | PRN
  Administered 2019-08-27: 2 mL via INTRADERMAL

## 2019-08-27 MED ORDER — LIDOCAINE HCL (PF) 1 % IJ SOLN
INTRAMUSCULAR | Status: AC
  Filled 2019-08-27: qty 30

## 2019-08-27 MED ORDER — ASPIRIN 81 MG PO CHEW
CHEWABLE_TABLET | ORAL | Status: AC
  Administered 2019-08-27: 81 mg
  Filled 2019-08-27: qty 1

## 2019-08-27 MED ORDER — MIDAZOLAM HCL 2 MG/2ML IJ SOLN
INTRAMUSCULAR | Status: AC
  Filled 2019-08-27: qty 2

## 2019-08-27 MED ORDER — HEPARIN SODIUM (PORCINE) 1000 UNIT/ML IJ SOLN
INTRAMUSCULAR | Status: AC
  Filled 2019-08-27: qty 1

## 2019-08-27 MED ORDER — SODIUM CHLORIDE 0.9% FLUSH: 3.0000 mL | INTRAVENOUS | Status: DC | PRN

## 2019-08-27 MED ORDER — FENTANYL CITRATE (PF) 100 MCG/2ML IJ SOLN
INTRAMUSCULAR | Status: DC | PRN
  Administered 2019-08-27: 25 ug via INTRAVENOUS

## 2019-08-27 MED ORDER — HEPARIN SODIUM (PORCINE) 1000 UNIT/ML IJ SOLN
INTRAMUSCULAR | Status: DC | PRN
  Administered 2019-08-27: 3500 [IU] via INTRAVENOUS

## 2019-08-27 MED ORDER — IOHEXOL 350 MG/ML SOLN
INTRAVENOUS | Status: DC | PRN
  Administered 2019-08-27: 40 mL via INTRA_ARTERIAL

## 2019-08-27 MED ORDER — SODIUM CHLORIDE 0.9 % IV SOLN: INTRAVENOUS | Status: DC

## 2019-08-27 MED ORDER — SODIUM CHLORIDE 0.9 % IV SOLN
250.0000 mL | INTRAVENOUS | Status: DC | PRN
  Administered 2019-08-27: 07:00:00 250 mL via INTRAVENOUS

## 2019-08-27 MED ORDER — FENTANYL CITRATE (PF) 100 MCG/2ML IJ SOLN
INTRAMUSCULAR | Status: AC
  Filled 2019-08-27: qty 2

## 2019-08-27 MED ORDER — VERAPAMIL HCL 2.5 MG/ML IV SOLN
INTRAVENOUS | Status: DC | PRN
  Administered 2019-08-27: 10 mL via INTRA_ARTERIAL

## 2019-08-27 MED ORDER — HEPARIN (PORCINE) IN NACL 1000-0.9 UT/500ML-% IV SOLN
INTRAVENOUS | Status: DC | PRN
  Administered 2019-08-27 (×2): 500 mL

## 2019-08-27 SURGICAL SUPPLY — 11 items
CATH 5FR JL3.5 JR4 ANG PIG MP (CATHETERS) ×1 IMPLANT
CATH INFINITI 5 FR 3DRC (CATHETERS) ×1 IMPLANT
CATH INFINITI 5FR AL1 (CATHETERS) ×1 IMPLANT
DEVICE RAD COMP TR BAND LRG (VASCULAR PRODUCTS) ×1 IMPLANT
GLIDESHEATH SLEND SS 6F .021 (SHEATH) ×1 IMPLANT
GUIDEWIRE INQWIRE 1.5J.035X260 (WIRE) IMPLANT
INQWIRE 1.5J .035X260CM (WIRE) ×2
KIT HEART LEFT (KITS) ×2 IMPLANT
PACK CARDIAC CATHETERIZATION (CUSTOM PROCEDURE TRAY) ×2 IMPLANT
TRANSDUCER W/STOPCOCK (MISCELLANEOUS) ×2 IMPLANT
TUBING CIL FLEX 10 FLL-RA (TUBING) ×2 IMPLANT

## 2019-08-27 NOTE — Interval H&P Note (Signed)
History and Physical Interval Note:  08/27/2019 7:50 AM  Courtney Romero  has presented today for surgery, with the diagnosis of heart failure.  The various methods of treatment have been discussed with the patient and family. After consideration of risks, benefits and other options for treatment, the patient has consented to  Procedure(s): LEFT HEART CATH AND CORONARY ANGIOGRAPHY (N/A) and possible coronary angioplasty as a surgical intervention.  The patient's history has been reviewed, patient examined, no change in status, stable for surgery.  I have reviewed the patient's chart and labs.  Questions were answered to the patient's satisfaction.     Sunshine Mackowski

## 2019-08-27 NOTE — Progress Notes (Signed)
Patient and husband were given discharged instructions. Both verbalized understanding.

## 2019-08-27 NOTE — Discharge Instructions (Signed)
Drink plenty of fluid for 48 hours and keep wrist elevated at heart level for 24 hours  Radial Site Care   This sheet gives you information about how to care for yourself after your procedure. Your health care provider may also give you more specific instructions. If you have problems or questions, contact your health care provider. What can I expect after the procedure? After the procedure, it is common to have:  Bruising and tenderness at the catheter insertion area. Follow these instructions at home: Medicines  Take over-the-counter and prescription medicines only as told by your health care provider. Insertion site care 1. Follow instructions from your health care provider about how to take care of your insertion site. Make sure you: ? Wash your hands with soap and water before you change your bandage (dressing). If soap and water are not available, use hand sanitizer. ? remove your dressing as told by your health care provider. In 24 hours 2. Check your insertion site every day for signs of infection. Check for: ? Redness, swelling, or pain. ? Fluid or blood. ? Pus or a bad smell. ? Warmth. 3. Do not take baths, swim, or use a hot tub until your health care provider approves. 4. You may shower 24-48 hours after the procedure, or as directed by your health care provider. ? Remove the dressing and gently wash the site with plain soap and water. ? Pat the area dry with a clean towel. ? Do not rub the site. That could cause bleeding. 5. Do not apply powder or lotion to the site. Activity   1. For 24 hours after the procedure, or as directed by your health care provider: ? Do not flex or bend the affected arm. ? Do not push or pull heavy objects with the affected arm. ? Do not drive yourself home from the hospital or clinic. You may drive 24 hours after the procedure unless your health care provider tells you not to. ? Do not operate machinery or power tools. 2. Do not lift  anything that is heavier than 10 lb (4.5 kg), or the limit that you are told, until your health care provider says that it is safe. For 4 days 3. Ask your health care provider when it is okay to: ? Return to work or school. ? Resume usual physical activities or sports. ? Resume sexual activity. General instructions  If the catheter site starts to bleed, raise your arm and put firm pressure on the site. If the bleeding does not stop, get help right away. This is a medical emergency.  If you went home on the same day as your procedure, a responsible adult should be with you for the first 24 hours after you arrive home.  Keep all follow-up visits as told by your health care provider. This is important. Contact a health care provider if:  You have a fever.  You have redness, swelling, or yellow drainage around your insertion site. Get help right away if:  You have unusual pain at the radial site.  The catheter insertion area swells very fast.  The insertion area is bleeding, and the bleeding does not stop when you hold steady pressure on the area.  Your arm or hand becomes pale, cool, tingly, or numb. These symptoms may represent a serious problem that is an emergency. Do not wait to see if the symptoms will go away. Get medical help right away. Call your local emergency services (911 in the [place] Do not   drive yourself to the hospital. Summary  After the procedure, it is common to have bruising and tenderness at the site.  Follow instructions from your health care provider about how to take care of your radial site wound. Check the wound every day for signs of infection.  Do not lift anything that is heavier than 10 lb (4.5 kg), or the limit that you are told, until your health care provider says that it is safe. This information is not intended to replace advice given to you by your health care provider. Make sure you discuss any questions you have with your health care  provider. Document Revised: 05/11/2017 Document Reviewed: 05/11/2017 Elsevier Patient Education  2020 Elsevier Inc.  

## 2019-08-31 ENCOUNTER — Ambulatory Visit: Attending: Internal Medicine

## 2019-08-31 DIAGNOSIS — Z23 Encounter for immunization: Secondary | ICD-10-CM

## 2019-08-31 NOTE — Progress Notes (Signed)
   Covid-19 Vaccination Clinic  Name:  TKEYAH RAZZAQ    MRN: [id number] DOB: 03/16/1961  08/31/2019  Ms. Ertle was observed post Covid-19 immunization for 15 minutes without incident. She was provided with Vaccine Information Sheet and instruction to access the V-Safe system.   Ms. Valazquez was instructed to call 911 with any severe reactions post vaccine: Marland Kitchen Difficulty breathing  . Swelling of face and throat  . A fast heartbeat  . A bad rash all over body  . Dizziness and weakness   Immunizations Administered    Name Date Dose VIS Date Route   Pfizer COVID-19 Vaccine 08/31/2019  9:30 AM 0.3 mL 06/13/2018 Intramuscular   Manufacturer: [place]   Lot: [id number]   [place]: [id number]

## 2019-09-22 ENCOUNTER — Ambulatory Visit: Attending: Internal Medicine

## 2019-09-22 DIAGNOSIS — Z23 Encounter for immunization: Secondary | ICD-10-CM

## 2019-09-22 NOTE — Progress Notes (Signed)
   Covid-19 Vaccination Clinic  Name:  Courtney Romero    MRN: [id number] DOB: 03-18-1961  09/22/2019  [name] was observed post Covid-19 immunization for 15 minutes without incident. She was provided with Vaccine Information Sheet and instruction to access the V-Safe system.   [name] was instructed to call 911 with any severe reactions post vaccine: Marland Kitchen Difficulty breathing  . Swelling of face and throat  . A fast heartbeat  . A bad rash all over body  . Dizziness and weakness   Immunizations Administered    Name Date Dose VIS Date Route   Pfizer COVID-19 Vaccine 09/22/2019 11:05 AM 0.3 mL 06/13/2018 Intramuscular   Manufacturer: [employer]   Lot: [id number]   [place]: [id number]

## 2019-09-25 ENCOUNTER — Ambulatory Visit

## 2019-11-13 ENCOUNTER — Telehealth: Payer: Self-pay | Admitting: Cardiovascular Disease

## 2019-11-13 NOTE — Telephone Encounter (Signed)
Fine with me. Saw her only once before.

## 2019-11-13 NOTE — Telephone Encounter (Signed)
Patient is requesting to switch providers from Dr. Fletcher Anon to Dr. Marlou Porch. Please advise.

## 2019-11-14 NOTE — Telephone Encounter (Signed)
[place] with me Candee Furbish, MD

## 2020-01-08 IMAGING — US US EXTREM LOW ARTERIAL SEG MULTIPLE*R*
1 series · 14 of 25 positions shown · non-contrast
Comparison: None.

CLINICAL DATA: 56-year-old female with right lower extremity
claudication

EXAM:
RIGHT LOWER EXTREMITY ARTERIAL DUPLEX SCAN
TECHNIQUE: Gray-scale sonography as well as color Doppler and duplex ultrasound
was performed to evaluate the lower extremity arteries including the
common, superficial and profunda femoral arteries, popliteal artery
and calf arteries.

[Series 1: us extrem low arterial seg multiple*right* · 0.06mm/px · 14 of 32 slices shown]
[im 1/32]
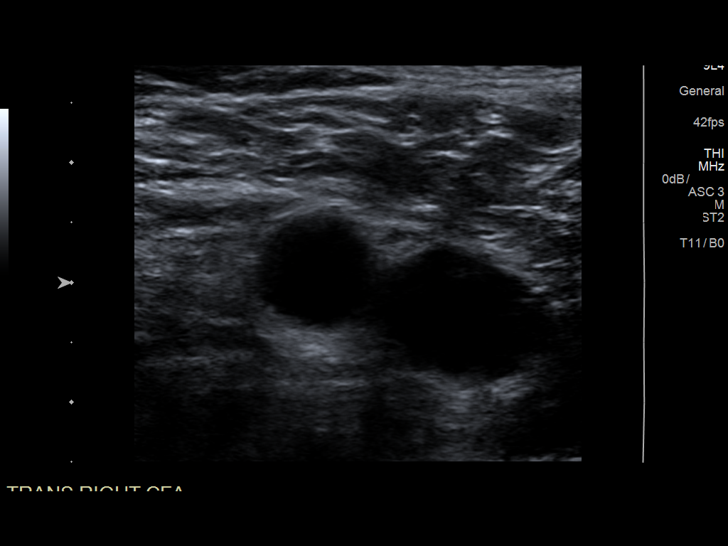
[im 3/32]
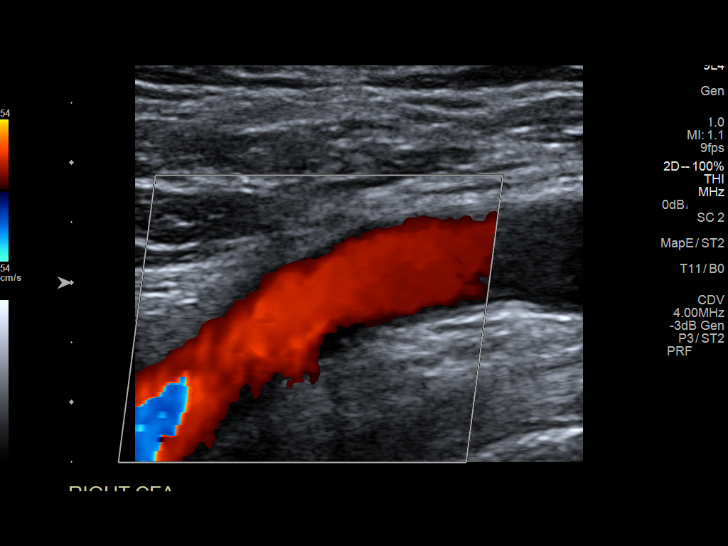
[im 6/32]
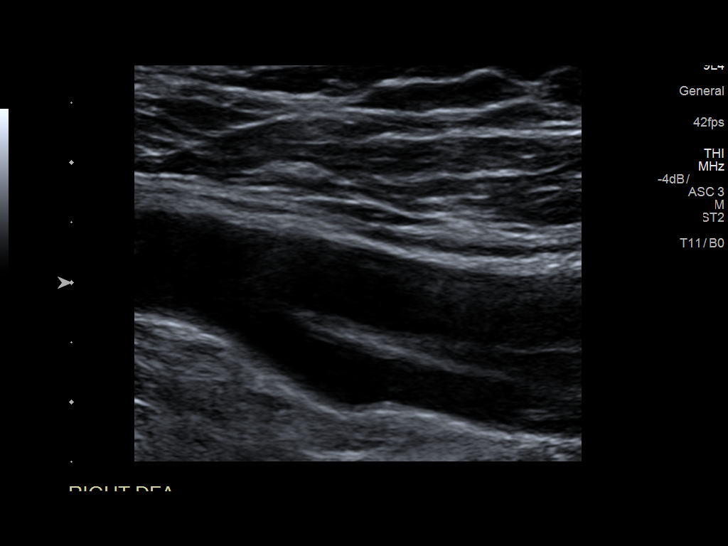
[im 8/32]
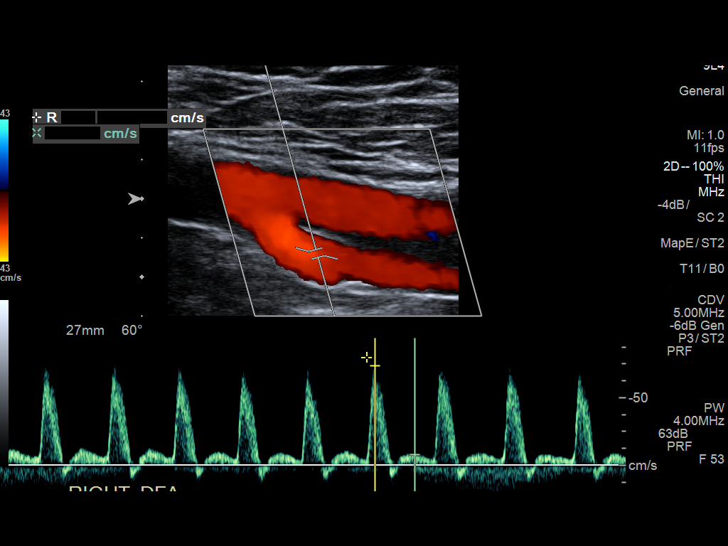
[im 11/32]
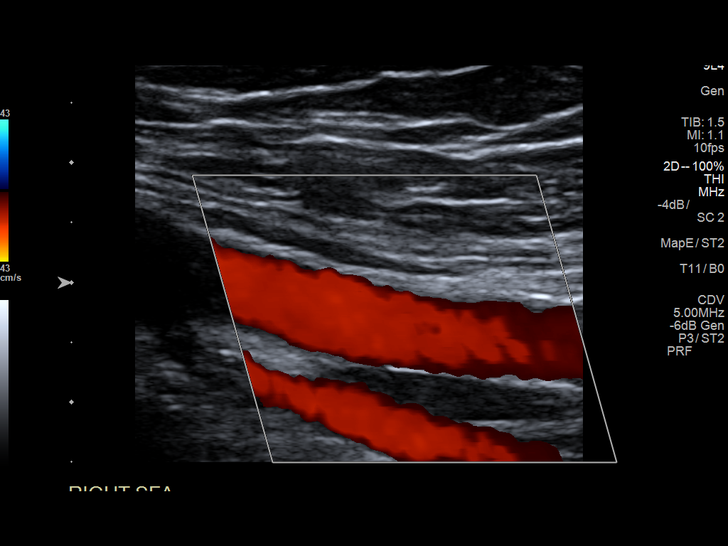
[im 12/32]
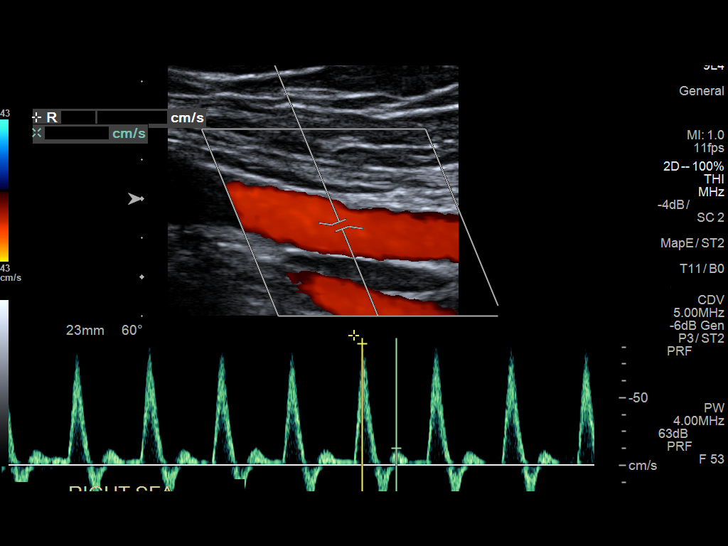
[im 15/32]
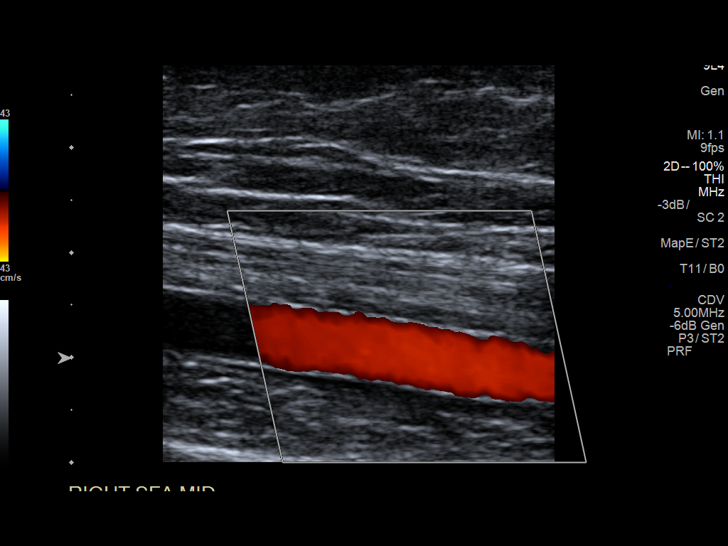
[im 17/32]
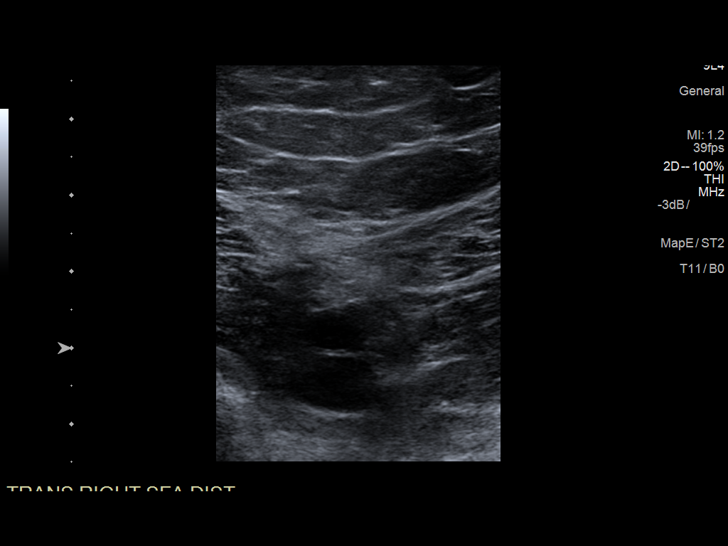
[im 20/32]
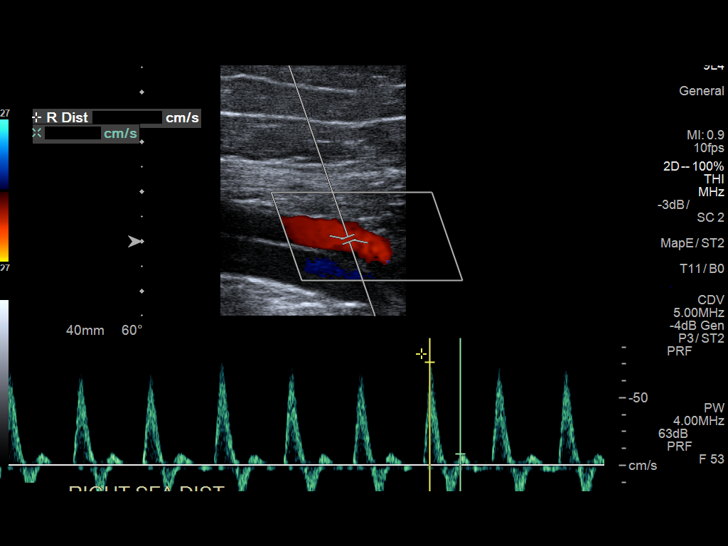
[im 21/32]
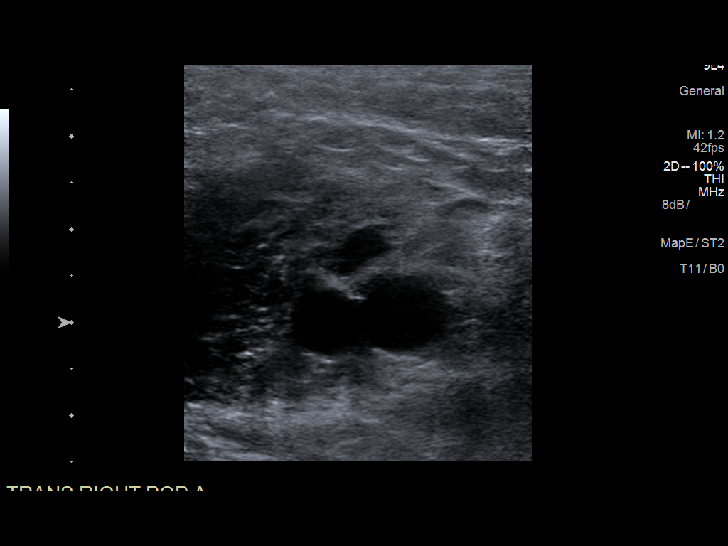
[im 24/32]
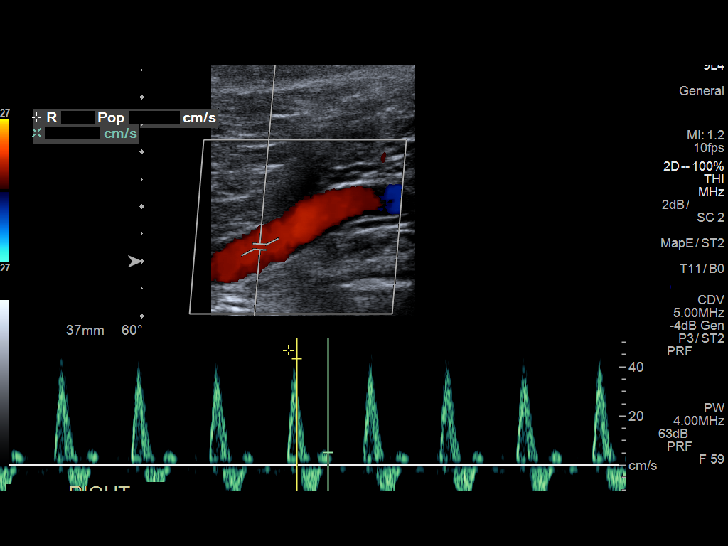
[im 26/32]
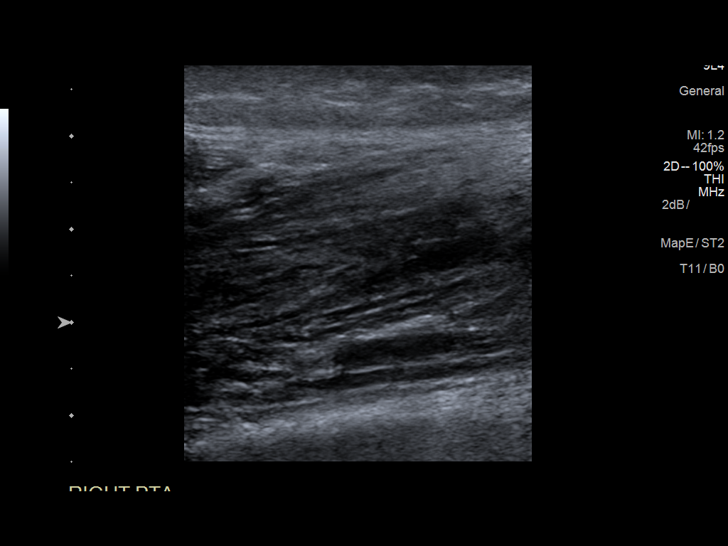
[im 29/32]
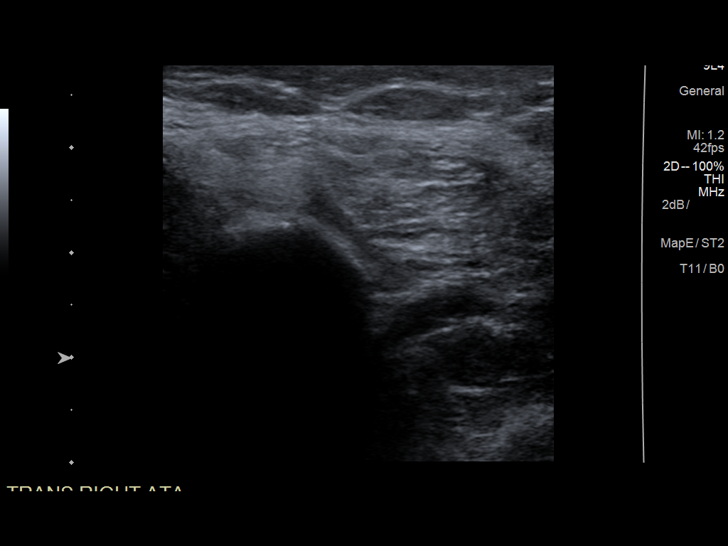
[im 32/32]
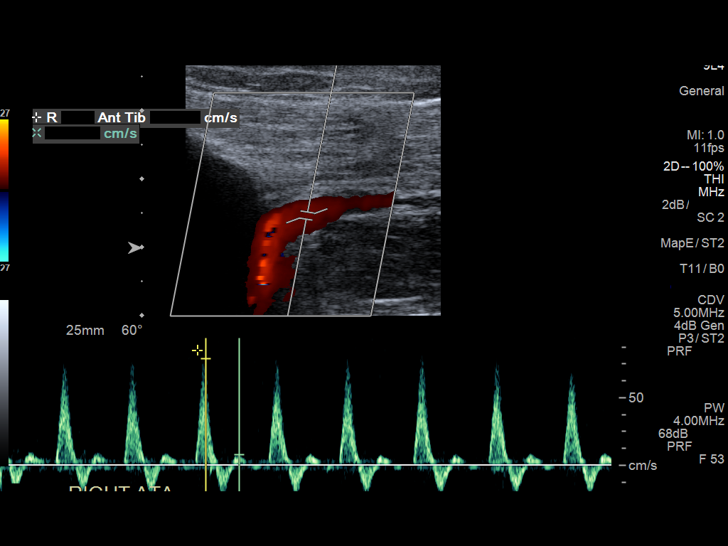

[14 of 25 positions shown; findings below may reference images not displayed]

FINDINGS: Right lower Extremity

Inflow: Normal common femoral arterial waveforms and velocities. No
evidence of inflow (aortoiliac) disease.

Outflow: Normal profunda femoral, superficial femoral and popliteal
arterial waveforms and velocities. No focal elevation of the PSV to
suggest stenosis.

Runoff: Normal posterior and anterior tibial arterial waveforms and
velocities. Vessels are patent to the ankle.

posterior and anterior tibial arterial waveforms and velocities.
Vessels are patent to the ankle.
IMPRESSION: Normal examination. No evidence of hemodynamically significant
stenosis or occlusion.

## 2020-01-15 ENCOUNTER — Ambulatory Visit (INDEPENDENT_AMBULATORY_CARE_PROVIDER_SITE_OTHER): Admitting: Internal Medicine

## 2020-01-15 ENCOUNTER — Other Ambulatory Visit: Payer: Self-pay

## 2020-01-15 ENCOUNTER — Encounter: Payer: Self-pay | Admitting: Internal Medicine

## 2020-01-15 VITALS — BP 124/76 | HR 74 | Temp 97.3°F | Ht 67.0 in | Wt 157.6 lb

## 2020-01-15 DIAGNOSIS — R918 Other nonspecific abnormal finding of lung field: Secondary | ICD-10-CM

## 2020-01-15 DIAGNOSIS — J4520 Mild intermittent asthma, uncomplicated: Secondary | ICD-10-CM

## 2020-01-15 NOTE — Progress Notes (Signed)
[facility] Consultation      Date: 01/15/2020,   MRN# [id number] JANALEE GROBE [birth year]      Patient profile 59 yo Patient is former smoker, states that she has irritation back of throat and sometimes in middle of chest Smoked 1.5 PPD for 20 years She works at [employer] and exposed to hair sprays all day long  Patient also with h/o 2 R lung nodules subcentimeter RML,RLL stable in size First scan in 03/2015 and second scan in 06/2015 shows no change in nodule size and no acute findings Third CT scan 02/2016 shows stable lung nodules since 03/2015  diagnosed with breast cancer status post lumpectomy in February 2019 Patient underwent radiation therapy 20 treatments in May 2019   Office SPiro shows NORMAL SPIRO Ratio 84% Fev1 3.1 L 104% FVC 3.7 97%  PFT 02/14/18 Ratio 82% FEv1107% predicted No evidence of obstructive or restrictive lung disease   CHIEF COMPLAINT:   Follow-up pneumonia Follow-up CT chest    HISTORY OF PRESENT ILLNESS  Intermittent shortness of breath Intermittent dyspnea on exertion Symptoms have been stable over the last 1 year CT coronary calcium score is elevated Findings discussed with patient  Previous office visit patient was diagnosed with pneumonia however follow-up CT scan showed resolution of right middle lung lobe pneumonia   No evidence of heart failure at this time No evidence or signs of infection at this time No respiratory distress No fevers, chills, nausea, vomiting, diarrhea No evidence of lower extremity edema No evidence hemoptysis   Patient has intermittent signs of sinus congestion History of pneumonia CT chest 2019 right middle lobe opacity Resolved with antibiotics with Z-Pak follow-up CT chest showed resolution of pneumonia   Follow-up CT chest July 2020-complete resolution of the right middle lobe opacity Patient has stable subcentimeter right lower lobe lung nodule 7 mm and 3 mm  nodules Stable over the last 3 years  Inhaler usage at this time Previous pulmonary function test does not show evidence of obstructive or restrictive lung disease     Current Medication:  Current Outpatient Medications:  .  aspirin EC 81 MG tablet, Take 1 tablet (81 mg total) by mouth daily., Disp: 90 tablet, Rfl: 3 .  Calcium Carbonate (CALCIUM 600 PO), Take 600 mg by mouth daily. , Disp: , Rfl:  .  cholecalciferol (VITAMIN D) 1000 units tablet, Take 1,000 Units by mouth daily., Disp: , Rfl:  .  escitalopram (LEXAPRO) 5 MG tablet, Take 2.5 mg by mouth daily., Disp: , Rfl:  .  LORazepam (ATIVAN) 0.5 MG tablet, Take 0.5 mg by mouth every 8 (eight) hours as needed for anxiety. , Disp: , Rfl:  .  Melatonin 5 MG TABS, Take 5 mg by mouth at bedtime. , Disp: , Rfl:  .  methylcellulose (CITRUCEL) oral powder, Take 1 packet by mouth daily., Disp: , Rfl:  .  omeprazole (PRILOSEC) 40 MG capsule, Take 40 mg by mouth daily., Disp: , Rfl:  .  rosuvastatin (CRESTOR) 40 MG tablet, Take 1 tablet (40 mg total) by mouth daily., Disp: 90 tablet, Rfl: 3 .  tamoxifen (NOLVADEX) 20 MG tablet, Take 1 tablet (20 mg total) by mouth daily., Disp: 90 tablet, Rfl: 5 .  zolpidem (AMBIEN) 5 MG tablet, Take 5 mg by mouth at bedtime as needed for sleep. , Disp: , Rfl:     ALLERGIES   Propoxyphene, Thioridazine hcl, Bupropion, Citalopram, Duloxetine, and Thioridazine     REVIEW OF SYSTEMS  Review of Systems  Constitutional: Negative for chills, diaphoresis, fever, malaise/fatigue and weight loss.  HENT: Negative for congestion.   Respiratory: Positive for shortness of breath. Negative for cough, hemoptysis, sputum production and wheezing.   Cardiovascular: Negative for chest pain, palpitations, orthopnea and leg swelling.  Gastrointestinal: Negative for heartburn.  Neurological: Negative for weakness.  All other systems reviewed and are negative.  BP 124/76 (BP Location: Left Arm, Patient Position:  Sitting, Cuff Size: Normal)   Pulse 74   Temp (!) 97.3 F (36.3 C) (Temporal)   Ht 5\' 7"  (1.702 m)   Wt 157 lb 9.6 oz (71.5 kg)   SpO2 99%   BMI 24.68 kg/m   .vs   PHYSICAL EXAM  Physical Exam Constitutional:      General: She is not in acute distress.    Appearance: She is not diaphoretic.  HENT:     Mouth/Throat:     Pharynx: No oropharyngeal exudate.  Cardiovascular:     Rate and Rhythm: Normal rate and regular rhythm.     Heart sounds: Normal heart sounds. No murmur heard.   Pulmonary:     Effort: Pulmonary effort is normal. No respiratory distress.     Breath sounds: Normal breath sounds. No stridor. No wheezing.  Musculoskeletal:        General: Normal range of motion.     Cervical back: Neck supple.  Skin:    General: Skin is warm.  Neurological:     Mental Status: She is alert and oriented to person, place, and time.     Cranial Nerves: No cranial nerve deficit.          IMAGING   CT Chest 02/2016 Pulmonary nodules measure 7 mm or less in size and are stable from 03/28/2015.  CT chest 01/2018 Right middle lobe opacification Findings consistent with pneumonia Im CT chest 1029 ages reveiwed  CT chest 11/08/2018   CT chest 11/08/2018 CT chest 11/08/2018  Images reviewed today  interval resolution of previously seen consolidative airspace opacity of the medial segment right middle lobe.  Stable benign small pulmonary nodule of the right lower lobe measuring 7 mm  Stable benign 3 mm nodule of the right lower, CT scans reviewed for the last 7 years patient does not need any more scans at this time CT chest reviewed for the last several years   ASSESSMENT/PLAN   59 year old pleasant white female seen today for follow-up abnormal CT chest from 1 year ago Resolution of right middle lobe opacification consistent with pneumonia Patient with previous history of 7 mm and 3 mm lung nodules which have been able over the last 3 years  Patient does have  intermittent shortness of breath however most likely related to underlying deconditioned state as there is no evidence of obstructive or restrictive lung disease function testing as well her his previous CT chest does not show any structural lung damage Patient may have intermittent reactive airways disease as she is a Radiographer, therapeutic  No inhaler therapy at this time as discussed with the patient   Patient does have a history of breast cancer status post radiation therapy with bilateral lung nodules subcentimeter and associated with chronic allergic rhinitis  Abnormal CT calcium score of the coronary arteries Follow-up cardiology   History of shortness of breath Based on pulmonary function testing and her lung nodules patient does not have any signs or symptoms of obstructive or restrictive lung disease nor does she have any evidence of interstitial lung disease  there is no significant evidence of radiation pneumonitis at this time No indication for prednisone or and no indication for inhalers at this time    CT chest no longer needed for interval assessment nodules are benign     COVID-19 EDUCATION: The signs and symptoms of COVID-19 were discussed with the patient and how to seek care for testing (follow up with PCP or arrange E-visit).  The importance of social distancing was discussed today.  MEDICATION ADJUSTMENTS/LABS AND TESTS ORDERED:  CURRENT MEDICATIONS REVIEWED AT LENGTH WITH PATIENT TODAY COVID 19 VACCINE completed June 2021  Patient  satisfied with Plan of action and management. All questions answered  Follow up 1 year  Total time spent 32 minutes  Corrin Parker, M.D.  [facility]  Medical Director [place] Director [facility] Cardio-Pulmonary Department

## 2020-01-15 NOTE — Patient Instructions (Signed)
Follow up in 1 year Avoid second hand smoke exposure Follow up cardiology and oncology

## 2020-01-17 ENCOUNTER — Other Ambulatory Visit: Payer: Self-pay

## 2020-01-17 ENCOUNTER — Encounter: Payer: Self-pay | Admitting: Cardiology

## 2020-01-17 ENCOUNTER — Ambulatory Visit (INDEPENDENT_AMBULATORY_CARE_PROVIDER_SITE_OTHER): Admitting: Cardiology

## 2020-01-17 VITALS — BP 132/78 | HR 81 | Ht 67.0 in | Wt 158.8 lb

## 2020-01-17 DIAGNOSIS — I739 Peripheral vascular disease, unspecified: Secondary | ICD-10-CM

## 2020-01-17 DIAGNOSIS — I2510 Atherosclerotic heart disease of native coronary artery without angina pectoris: Secondary | ICD-10-CM | POA: Diagnosis not present

## 2020-01-17 DIAGNOSIS — Z79899 Other long term (current) drug therapy: Secondary | ICD-10-CM

## 2020-01-17 MED ORDER — ROSUVASTATIN CALCIUM 20 MG PO TABS
20.0000 mg | ORAL_TABLET | Freq: Every day | ORAL | 3 refills | Status: DC
Start: 2020-01-17 — End: 2021-02-09

## 2020-01-17 NOTE — Progress Notes (Signed)
Cardiology Office Note:    Date:  01/17/2020   ID:  Courtney Romero, DOB 1960-06-03, MRN [id number]  PCP:  [name], MD  [facility] Cardiologist:  Candee Furbish, MD  [facility] Electrophysiologist:  None   Referring MD: [name], MD     History of Present Illness:    Courtney Romero is a 59 y.o. female last seen by Dr. Haroldine Laws on 08/20/2019, note reviewed, with GERD, hepatitis C virus treated with Harvoni, left breast cancer treatment 2018 and 2019 with a lumpectomy CRT and no chemotherapy who had chest discomfort.  On 08/27/2018 when she underwent cardiac catheterization, personally reviewed that revealed the following:  Prox RCA to Mid RCA lesion is 30% stenosed.  Prox LAD to Mid LAD lesion is 20% stenosed.   Findings:  1, Mild non-obstructive CAD 2. LVEF 60-65%  Plan:  Medical therapy.  Prior to cardiac catheterization on 07/23/2019 she underwent coronary CT scan which showed the following: 1. Coronary artery calcium score 566 Agatston units. This places the patient in the 99th percentile for age and gender, suggesting high risk for future cardiac events.  2. Extensive plaque in the coronaries, mainly mild stenosis but possible moderate stenosis in the proximal LAD at the take-off of D2.   Covid infection in January 2021.  She has had GI evaluation for epigastric discomfort as well.  Pain can happen every day.  Can last an hour a few minutes.  Walking on the treadmill.  Walks 2 miles without any chest pain.  She is very worried about heart attack possibilities.  [name], her husband.  Patient of Dr. Clayborne Dana for several years.  Her father had his first heart attack at age 57, currently 60 years old with CABG and pacemaker followed by Dr. Caryl Comes.  Her brother recently had 2 stents placed at age 21.  No fevers chills nausea vomiting syncope bleeding.  Melissa here in pharmacy is her niece.   Past Medical History:  Diagnosis  Date  . Abnormal CAT scan 2013  . Anxiety   . Cancer ([place])   . Family history of breast cancer   . Family history of pancreatic cancer   . Family history of prostate cancer   . GERD (gastroesophageal reflux disease)   . Hepatitis C 2001   Follow[facility] Charlean [facility]ilipp[facility]  . History of radiation therapy 07/26/17- 08/22/17   40.05 Gy directed to the left breast in 15 fractions followed by a boost of 10 Gy in 5 fractions.   . Personal history of radiation therapy   . PONV (postoperative nausea and vomiting)     Past Surgical History:  Procedure Laterality Date  . ABDOMINAL HYSTERECTOMY  2009  . breast cyst removal  1995  . BREAST LUMPECTOMY Left   . BREAST LUMPECTOMY WITH RADIOACTIVE SEED AND SENTINEL LYMPH NODE BIOPSY Left 06/14/2017   Procedure: LEFT BREAST SEED LOCALIZED LUMPECTOMY (2 SEEDS) AND SENTINEL LYMPH NODE BIOPSY;  Surgeon: Erroll Luna, MD;  Location: [facility];  Service: General;  Laterality: Left;  . COLONOSCOPY  2011  . COLONOSCOPY WITH PROPOFOL N/A 12/12/2017   Procedure: COLONOSCOPY WITH PROPOFOL;  Surgeon: Jonathon Bellows, MD;  Location[facility] ENDOSCOPY;  Service: Gastroenterology;  Laterality: N/A;  . ESOPHAGOGASTRODUODENOSCOPY (EGD) WITH PROPOFOL N/A 12/12/2017   Procedure: ESOPHAGOGASTRODUODENOSCOPY (EGD) WITH PROPOFOL;  Surgeon: Jonathon Bellows, MD;  Location: [facility] ENDOSCOPY;  Service: Gastroenterology;  Laterality: N/A;  . FLEXIBLE SIGMOIDOSCOPY  February 12, 2013   have  normal perianal exam, question focal prolapse. 2 small scars in the rectum.  Marland Kitchen HEMORRHOID BANDING  2012   3 times over three months  . LEFT HEART CATH AND CORONARY ANGIOGRAPHY N/A 08/27/2019   Procedure: LEFT HEART CATH AND CORONARY ANGIOGRAPHY;  Surgeon: Jolaine Artist, MD;  Location: [facility] CV LAB;  Service: Cardiovascular;  Laterality: N/A;  . SIGMOIDOSCOPY  2013  . TONSILECTOMY/ADENOIDECTOMY WITH MYRINGOTOMY  remote  . UPPER GI ENDOSCOPY  2013    Current  Medications: Current Meds  Medication Sig  . aspirin EC 81 MG tablet Take 1 tablet (81 mg total) by mouth daily.  . Calcium Carbonate (CALCIUM 600 PO) Take 600 mg by mouth daily.   . cholecalciferol (VITAMIN D) 1000 units tablet Take 1,000 Units by mouth daily.  Marland Kitchen LORazepam (ATIVAN) 0.5 MG tablet Take 0.5 mg by mouth every 8 (eight) hours as needed for anxiety.   . Melatonin 5 MG TABS Take 5 mg by mouth at bedtime.   . methylcellulose (CITRUCEL) oral powder Take 1 packet by mouth daily.  Marland Kitchen omeprazole (PRILOSEC) 40 MG capsule Take 40 mg by mouth daily.  . tamoxifen (NOLVADEX) 20 MG tablet Take 1 tablet (20 mg total) by mouth daily.  Marland Kitchen zolpidem (AMBIEN) 5 MG tablet Take 5 mg by mouth at bedtime as needed for sleep.   . [DISCONTINUED] rosuvastatin (CRESTOR) 40 MG tablet Take 1 tablet (40 mg total) by mouth daily. (Patient taking differently: Take 20 mg by mouth daily. )     Allergies:   Propoxyphene, Thioridazine hcl, Bupropion, Citalopram, Duloxetine, and Thioridazine   Social History   Socioeconomic History  . Marital status: Married    Spouse name: Not on file  . Number of children: Not on file  . Years of education: Not on file  . Highest education level: Not on file  Occupational History  . Occupation: Full Time  Tobacco Use  . Smoking status: Former Smoker    Packs/day: 1.50    Years: 20.00    Pack years: 30.00    Types: Cigarettes    Quit date: 04/19/1998    Years since quitting: 21.7  . Smokeless tobacco: Never Used  . Tobacco comment: quit smoking in 2000  Vaping Use  . Vaping Use: Never used  Substance and Sexual Activity  . Alcohol use: No  . Drug use: No  . Sexual activity: Yes  Other Topics Concern  . Not on file  Social History Narrative   Regular exercise: Yes   Social Determinants of Health   Financial Resource Strain:   . Difficulty of Paying Living Expenses: Not on file  Food Insecurity:   . Worried About Charity fundraiser in the Last Year: Not on  file  . Ran Out of Food in the Last Year: Not on file  Transportation Needs:   . Lack of Transportation (Medical): Not on file  . Lack of Transportation (Non-Medical): Not on file  Physical Activity:   . Days of Exercise per Week: Not on file  . Minutes of Exercise per Session: Not on file  Stress:   . Feeling of Stress : Not on file  Social Connections:   . Frequency of Communication with Friends and Family: Not on file  . Frequency of Social Gatherings with Friends and Family: Not on file  . Attends Religious Services: Not on file  . Active Member of Clubs or Organizations: Not on file  . Attends Archivist Meetings: Not on  file  . Marital Status: Not on file     Family History: The patient's family history includes Atrial fibrillation in her father; Coronary artery disease in her father; Diabetes in her brother; Heart failure in her father; Hypertension in her mother; Thyroid disease in her mother.  ROS:   Please see the history of present illness.     All other systems reviewed and are negative.  EKGs/Labs/Other Studies Reviewed:   Recent Labs: 07/03/19: ALT 15 08/24/2019: BUN 12; Creatinine, Ser 0.90; Hemoglobin 13.5; Platelets 155; Potassium 3.8; Sodium 143  Recent Lipid Panel No results found for: CHOL, TRIG, HDL, CHOLHDL, VLDL, LDLCALC, LDLDIRECT  Physical Exam:    VS:  BP 132/78   Pulse 81   Ht 5\' 7"  (1.702 m)   Wt 158 lb 12.8 oz (72 kg)   SpO2 99%   BMI 24.87 kg/m     Wt Readings from Last 3 Encounters:  01/17/20 158 lb 12.8 oz (72 kg)  01/15/20 157 lb 9.6 oz (71.5 kg)  08/27/19 155 lb (70.3 kg)     GEN:  Well nourished, well developed in no acute distress HEENT: Normal NECK: No JVD; No carotid bruits LYMPHATICS: No lymphadenopathy CARDIAC: RRR, no murmurs, rubs, gallops RESPIRATORY:  Clear to auscultation without rales, wheezing or rhonchi  ABDOMEN: Soft, non-tender, non-distended MUSCULOSKELETAL:  No edema; No deformity  SKIN: Warm and  dry NEUROLOGIC:  Alert and oriented x 3 PSYCHIATRIC:  Normal affect   ASSESSMENT:    1. PVD (peripheral vascular disease) ([place])   2. Medication management   3. CAD in native artery    PLAN:    In order of problems listed above:  Nonflow limiting coronary artery disease -Cardiac catheterization reassuring with no flow limitation.  She does have coronary calcified plaque.  Continue with aggressive risk factor modification.  Thankfully, based upon her coronary anatomy, her chest discomfort should not be from underlying coronary artery disease.  Currently doing well.  Exercising.  Wants to get back into swimming.  Would like to get back to the gym. -Continue with aggressive secondary prevention.  GERD -PPI.  Peripheral vascular disease -Calcified plaque noted in abdominal aorta at the bifurcation of the iliacs as well.  No claudication. -Since her husband had a 99% carotid stenosis without bruit heard on exam, she was requesting carotid Dopplers.  I think this seems reasonable given her underlying peripheral vascular disease as well.  40 minutes spent with review of medical records, personally reviewing cardiac catheterization, other data, prior notes, discussion with patient, husband.   Medication Adjustments/Labs and Tests Ordered: Current medicines are reviewed at length with the patient today.  Concerns regarding medicines are outlined above.  Orders Placed This Encounter  Procedures  . Lipid panel  . VAS US CAROTID   Meds ordered this encounter  Medications  . rosuvastatin (CRESTOR) 20 MG tablet    Sig: Take 1 tablet (20 mg total) by mouth daily.    Dispense:  90 tablet    Refill:  3    Patient Instructions  Medication Instructions:  The current medical regimen is effective;  continue present plan and medications.  *If you need a refill on your cardiac medications before your next appointment, please call your pharmacy*  Lab Work: Please return Monday for blood  work. (Lipid) If you have labs (blood work) drawn today and your tests are completely normal, you will receive your results only by: Marland Kitchen MyChart Message (if you have MyChart) OR . A paper copy  in the mail If you have any lab test that is abnormal or we need to change your treatment, we will call you to review the results.   Testing/Procedures: Your physician has requested that you have a carotid duplex. This test is an ultrasound of the carotid arteries in your neck. It looks at blood flow through these arteries that supply the brain with blood. Allow one hour for this exam. There are no restrictions or special instructions.  This is completed at our [place] office.    Follow-Up: At [facility], you and your health needs are our priority.  As part of our continuing mission to provide you with exceptional heart care, we have created designated Provider Care Teams.  These Care Teams include your primary Cardiologist (physician) and Advanced Practice Providers (APPs -  Physician Assistants and Nurse Practitioners) who all work together to provide you with the care you need, when you need it.  We recommend signing up for the patient portal called "MyChart".  Sign up information is provided on this After Visit Summary.  MyChart is used to connect with patients for Virtual Visits (Telemedicine).  Patients are able to view lab/test results, encounter notes, upcoming appointments, etc.  Non-urgent messages can be sent to your provider as well.   To learn more about what you can do with MyChart, go to [email or web address].    Your next appointment:   6 month(s)  The format for your next appointment:   In Person  Provider:   Candee Furbish, MD   Thank you for choosing [facility]!!        Signed, Candee Furbish, MD  01/17/2020 10:42 AM    [place]

## 2020-01-17 NOTE — Patient Instructions (Signed)
Medication Instructions:  The current medical regimen is effective;  continue present plan and medications.  *If you need a refill on your cardiac medications before your next appointment, please call your pharmacy*  Lab Work: Please return Monday for blood work. (Lipid) If you have labs (blood work) drawn today and your tests are completely normal, you will receive your results only by: Marland Kitchen MyChart Message (if you have MyChart) OR . A paper copy in the mail If you have any lab test that is abnormal or we need to change your treatment, we will call you to review the results.   Testing/Procedures: Your physician has requested that you have a carotid duplex. This test is an ultrasound of the carotid arteries in your neck. It looks at blood flow through these arteries that supply the brain with blood. Allow one hour for this exam. There are no restrictions or special instructions.  This is completed at our [place] office.    Follow-Up: At [facility], you and your health needs are our priority.  As part of our continuing mission to provide you with exceptional heart care, we have created designated Provider Care Teams.  These Care Teams include your primary Cardiologist (physician) and Advanced Practice Providers (APPs -  Physician Assistants and Nurse Practitioners) who all work together to provide you with the care you need, when you need it.  We recommend signing up for the patient portal called "MyChart".  Sign up information is provided on this After Visit Summary.  MyChart is used to connect with patients for Virtual Visits (Telemedicine).  Patients are able to view lab/test results, encounter notes, upcoming appointments, etc.  Non-urgent messages can be sent to your provider as well.   To learn more about what you can do with MyChart, go to [email or web address].    Your next appointment:   6 month(s)  The format for your next appointment:   In Person  Provider:   Candee Furbish, MD   Thank you for choosing [facility]!!

## 2020-01-21 ENCOUNTER — Ambulatory Visit (HOSPITAL_COMMUNITY)
Admission: RE | Admit: 2020-01-21 | Discharge: 2020-01-21 | Disposition: A | Discharge status: Home / Self Care | Source: Ambulatory Visit | Attending: Cardiology | Admitting: Cardiology

## 2020-01-21 ENCOUNTER — Other Ambulatory Visit: Payer: Self-pay | Admitting: Cardiology

## 2020-01-21 ENCOUNTER — Other Ambulatory Visit: Admitting: *Deleted

## 2020-01-21 ENCOUNTER — Other Ambulatory Visit: Payer: Self-pay

## 2020-01-21 DIAGNOSIS — I739 Peripheral vascular disease, unspecified: Secondary | ICD-10-CM | POA: Insufficient documentation

## 2020-01-21 DIAGNOSIS — I6521 Occlusion and stenosis of right carotid artery: Secondary | ICD-10-CM | POA: Diagnosis present

## 2020-01-21 DIAGNOSIS — I2510 Atherosclerotic heart disease of native coronary artery without angina pectoris: Secondary | ICD-10-CM

## 2020-01-21 DIAGNOSIS — Z79899 Other long term (current) drug therapy: Secondary | ICD-10-CM

## 2020-01-21 LAB — LIPID PANEL
Chol/HDL Ratio: 1.8 ratio (ref 0.0–4.4)
Cholesterol, Total: 133 mg/dL (ref 100–199)
HDL: 74 mg/dL (ref >39)
LDL Chol Calc (NIH): 41 mg/dL (ref 0–99)
Triglycerides: 95 mg/dL (ref 0–149)
VLDL Cholesterol Cal: 18 mg/dL (ref 5–40)

## 2020-01-22 ENCOUNTER — Telehealth: Payer: Self-pay | Admitting: Cardiology

## 2020-01-22 DIAGNOSIS — E0789 Other specified disorders of thyroid: Secondary | ICD-10-CM

## 2020-01-22 DIAGNOSIS — E079 Disorder of thyroid, unspecified: Secondary | ICD-10-CM

## 2020-01-22 NOTE — Telephone Encounter (Signed)
Carotid doppler results reviewed with patient.  Order placed for thyroid ultrasound to be done at [place] in [place]

## 2020-01-22 NOTE — Telephone Encounter (Signed)
Patient is returning call to discuss results from Carotid completed on 01/21/20.

## 2020-01-28 ENCOUNTER — Ambulatory Visit
Admission: RE | Admit: 2020-01-28 | Discharge: 2020-01-28 | Disposition: A | Discharge status: Home / Self Care | Source: Ambulatory Visit | Attending: Cardiology | Admitting: Cardiology

## 2020-01-28 DIAGNOSIS — E0789 Other specified disorders of thyroid: Secondary | ICD-10-CM

## 2020-01-28 DIAGNOSIS — E079 Disorder of thyroid, unspecified: Secondary | ICD-10-CM

## 2020-01-30 ENCOUNTER — Telehealth: Payer: Self-pay | Admitting: *Deleted

## 2020-01-30 DIAGNOSIS — E041 Nontoxic single thyroid nodule: Secondary | ICD-10-CM

## 2020-01-30 NOTE — Telephone Encounter (Signed)
IMPRESSION:  1. Thyroid tissue is mildly heterogeneous with nodules.  2. Nodule #1 in the right mid thyroid lobe is a TR 4 nodule and  meets criteria for biopsy. Recommend ultrasound-guided fine needle  aspiration of this nodule.  3. Nodule #2 in the left inferior thyroid lobe is a TR 5 nodule and  meets criteria for biopsy. Recommend ultrasound-guided fine needle  aspiration of this nodule.   Please have her set up with Dr. Cruzita Lederer with endocrinology for further evaluation and treatment. Thanks   Candee Furbish, MD

## 2020-02-01 ENCOUNTER — Encounter: Payer: Self-pay | Admitting: Oncology

## 2020-02-04 ENCOUNTER — Encounter: Payer: Self-pay | Admitting: Internal Medicine

## 2020-02-04 ENCOUNTER — Telehealth: Payer: Self-pay

## 2020-02-04 NOTE — Telephone Encounter (Signed)
This LPN called and spoke with pt, letting her know we would not be able to expedite a referral that was put in. Advised pt to call endocrinologist to ask for a sooner appt. And let pt know they would contact us in the event her bx was (+) for CA. Pt verbalized thanks and understanding.

## 2020-02-04 NOTE — Telephone Encounter (Signed)
Spoken to patient. Notified patient that she have to be an established patient before we can order. Advised patient to discuss with PCP.

## 2020-02-07 ENCOUNTER — Other Ambulatory Visit (HOSPITAL_COMMUNITY): Payer: Self-pay | Admitting: Cardiology

## 2020-02-07 ENCOUNTER — Telehealth: Payer: Self-pay | Admitting: *Deleted

## 2020-02-07 DIAGNOSIS — E041 Nontoxic single thyroid nodule: Secondary | ICD-10-CM

## 2020-02-07 NOTE — Telephone Encounter (Signed)
Called and left message for [place] in IR at [facility] to see if they do biopsies of thyroid nodules.  Requested she c/b with further information.

## 2020-02-07 NOTE — Telephone Encounter (Signed)
Orders have been placed in Epic by [facility] in IR.  Pt will be contacted by Central Scheduling to be scheduled for the procedure.    Left message for pt on VM of the above and central scheduling will be contacting her.  Advised if questions to please c/b.

## 2020-02-20 ENCOUNTER — Other Ambulatory Visit: Payer: Self-pay | Admitting: Cardiology

## 2020-02-20 DIAGNOSIS — E041 Nontoxic single thyroid nodule: Secondary | ICD-10-CM

## 2020-02-20 NOTE — Telephone Encounter (Signed)
Per Anderson Malta in IR at [facility] - pt is being scheduled at [facility].  Nancee Liter is the scheduler there and should be contacting the pt to schedule.  Pt is aware of this information and is looking forward to being contacted.

## 2020-02-21 NOTE — Telephone Encounter (Signed)
Pt has been scheduled for thyroid biopsy at [facility] on Monday 02/25/2020.

## 2020-02-25 ENCOUNTER — Other Ambulatory Visit: Payer: Self-pay

## 2020-02-25 ENCOUNTER — Ambulatory Visit
Admission: RE | Admit: 2020-02-25 | Discharge: 2020-02-25 | Disposition: A | Discharge status: Home / Self Care | Source: Ambulatory Visit | Attending: Cardiology | Admitting: Cardiology

## 2020-02-25 DIAGNOSIS — E041 Nontoxic single thyroid nodule: Secondary | ICD-10-CM | POA: Diagnosis not present

## 2020-02-26 LAB — CYTOLOGY - NON PAP

## 2020-02-27 ENCOUNTER — Encounter: Payer: Self-pay | Admitting: Internal Medicine

## 2020-02-27 ENCOUNTER — Other Ambulatory Visit: Payer: Self-pay

## 2020-02-27 ENCOUNTER — Ambulatory Visit (INDEPENDENT_AMBULATORY_CARE_PROVIDER_SITE_OTHER): Admitting: Internal Medicine

## 2020-02-27 VITALS — BP 130/82 | HR 77 | Ht 67.0 in | Wt 158.5 lb

## 2020-02-27 DIAGNOSIS — E0590 Thyrotoxicosis, unspecified without thyrotoxic crisis or storm: Secondary | ICD-10-CM | POA: Diagnosis not present

## 2020-02-27 DIAGNOSIS — E042 Nontoxic multinodular goiter: Secondary | ICD-10-CM

## 2020-02-27 LAB — T4, FREE: Free T4: 1 ng/dL (ref 0.60–1.60)

## 2020-02-27 LAB — TSH: TSH: 0.22 u[IU]/mL — ABNORMAL LOW (ref 0.35–4.50)

## 2020-02-27 NOTE — Patient Instructions (Signed)
-   Please stop by the labs today

## 2020-02-27 NOTE — Progress Notes (Signed)
Name: Courtney Romero  MRN/ DOB: [id number], 07-17-1960    Age/ Sex: 59 y.o., female    PCP: [name], MD   Reason for Endocrinology Evaluation: MNG     Date of Initial Endocrinology Evaluation: 02/27/2020     HPI: Courtney Romero is a 59 y.o. female with a past medical history of Breast Ca ( Left lumpectomy ) S/P radiation bur no chemo, CAD ( treated medically), S/P harvoni treatment . The patient presented for initial endocrinology clinic visit on 02/27/2020 for consultative assistance with her MNG   Pt had an incidental finding of thyroid nodules on a carotid ultrasound which prompted a thyroid ultrasound. She was found to have a right thyroid nodule and a left inferior thyroid nodule that met criteria for FNA. She is S/P FNA 02/25/2020    She clears her throat but no local neck symptoms  She has noted weight gain  She has lack of energy  She has occasional constipation and diarrhea    Both parents with thyroid disease     HISTORY:  Past Medical History:  Past Medical History:  Diagnosis Date  . Abnormal CAT scan 2013  . Anxiety   . Cancer ([place])   . Family history of breast cancer   . Family history of pancreatic cancer   . Family history of prostate cancer   . GERD (gastroesophageal reflux disease)   . Hepatitis C 2001   Followed by Dr. Charlean Sanfilippo at [facility]  . History of radiation therapy 07/26/17- 08/22/17   40.05 Gy directed to the left breast in 15 fractions followed by a boost of 10 Gy in 5 fractions.   . Personal history of radiation therapy   . PONV (postoperative nausea and vomiting)    Past Surgical History:  Past Surgical History:  Procedure Laterality Date  . ABDOMINAL HYSTERECTOMY  2009  . breast cyst removal  1995  . BREAST LUMPECTOMY Left   . BREAST LUMPECTOMY WITH RADIOACTIVE SEED AND SENTINEL LYMPH NODE BIOPSY Left 06/14/2017   Procedure: LEFT BREAST SEED LOCALIZED LUMPECTOMY (2 SEEDS) AND SENTINEL LYMPH NODE BIOPSY;  Surgeon:  Erroll Luna, MD;  Location: [facility];  Service: General;  Laterality: Left;  . COLONOSCOPY  2011  . COLONOSCOPY WITH PROPOFOL N/A 12/12/2017   Procedure: COLONOSCOPY WITH PROPOFOL;  Surgeon: Jonathon Bellows, MD;  Location[facility] ENDOSCOPY;  Service: Gastroenterology;  Laterality: N/A;  . ESOPHAGOGASTRODUODENOSCOPY (EGD) WITH PROPOFOL N/A 12/12/2017   Procedure: ESOPHAGOGASTRODUODENOSCOPY (EGD) WITH PROPOFOL;  Surgeon: Jonathon Bellows, MD;  Location: [facility] ENDOSCOPY;  Service: Gastroenterology;  Laterality: N/A;  . FLEXIBLE SIGMOIDOSCOPY  February 12, 2013   have normal perianal exam, question focal prolapse. 2 small scars in the rectum.  Marland Kitchen HEMORRHOID BANDING  2012   3 times over three months  . LEFT HEART CATH AND CORONARY ANGIOGRAPHY N/A 08/27/2019   Procedure: LEFT HEART CATH AND CORONARY ANGIOGRAPHY;  Surgeon: Jolaine Artist, MD;  Location: [facility] CV [facility];  Service: Cardiovascular;  Laterality: N/A;  . SIGMOIDOSCOPY  2013  . TONSILECTOMY/ADENOIDECTOMY WITH MYRINGOTOMY  remote  . UPPER GI ENDOSCOPY  2013      Social History:  reports that she quit smoking about 21 years ago. Her smoking use included cigarettes. She has a 30.00 pack-year smoking history. She has never used smokeless tobacco. She reports that she does not drink alcohol and does not use drugs.  Family History: family history includes Atrial fibrillation in her father; Coronary artery  disease in her father; Diabetes in her brother; Heart failure in her father; Hypertension in her mother; Thyroid disease in her mother.   HOME MEDICATIONS: Allergies as of 02/27/2020      Reactions   Propoxyphene Rash   Uncoded Allergy. Allergen: mellaril, Other Reaction: Other reaction, double vision Uncoded Allergy. Allergen: mellaril, Other Reaction: Other reaction, double vision Uncoded Allergy. Allergen: mellaril, Other Reaction: Other reaction, double vision   Thioridazine Hcl    REACTION: Reaction not known    Bupropion Rash, Other (See Comments)   Insomnia Insomnia   Citalopram Rash, Other (See Comments)   Lightheaded Lightheaded   Duloxetine Rash, Other (See Comments)   Insomnia Insomnia   Thioridazine Rash   REACTION: Reaction not known REACTION: Reaction not known      Medication List       Accurate as of February 27, 2020  8:48 AM. If you have any questions, ask your nurse or doctor.        STOP taking these medications   Citrucel oral powder Generic drug: methylcellulose Stopped by: Scarlette Shorts, MD     TAKE these medications   aspirin EC 81 MG tablet Take 1 tablet (81 mg total) by mouth daily.   Ativan 0.5 MG tablet Generic drug: LORazepam Take 0.5 mg by mouth every 8 (eight) hours as needed for anxiety.   CALCIUM 600 PO Take 600 mg by mouth daily.   cholecalciferol 1000 units tablet Commonly known as: VITAMIN D Take 1,000 Units by mouth daily.   melatonin 5 MG Tabs Take 5 mg by mouth at bedtime.   omeprazole 40 MG capsule Commonly known as: PRILOSEC Take 40 mg by mouth daily.   rosuvastatin 20 MG tablet Commonly known as: CRESTOR Take 1 tablet (20 mg total) by mouth daily.   tamoxifen 20 MG tablet Commonly known as: NOLVADEX Take 1 tablet (20 mg total) by mouth daily.   zolpidem 5 MG tablet Commonly known as: AMBIEN Take 5 mg by mouth at bedtime as needed for sleep.         REVIEW OF SYSTEMS: A comprehensive ROS was conducted with the patient and is negative except as per HPI      OBJECTIVE:  VS: BP 130/82   Pulse 77   Ht 5\' 7"  (1.702 m)   Wt 158 lb 8 oz (71.9 kg)   SpO2 98%   BMI 24.82 kg/m    Wt Readings from Last 3 Encounters:  02/27/20 158 lb 8 oz (71.9 kg)  01/17/20 158 lb 12.8 oz (72 kg)  01/15/20 157 lb 9.6 oz (71.5 kg)     EXAM: General: Pt appears well and is in NAD  Neck: General: Supple without adenopathy. Thyroid: Thyroid size normal.  Unable to appreciate  nodules on today's exam   Lungs: Clear with good  BS bilat with no rales, rhonchi, or wheezes  Heart: Auscultation: RRR.  Abdomen: Normoactive bowel sounds, soft, nontender, without masses or organomegaly palpable  Extremities:  BL LE: No pretibial edema normal ROM and strength.  Skin: Hair: Texture and amount normal with gender appropriate distribution Skin Inspection: No rashes Skin Palpation: Skin temperature, texture, and thickness normal to palpation  Neuro: Cranial nerves: II - XII grossly intact  Motor: Normal strength throughout DTRs: 2+ and symmetric in UE without delay in relaxation phase  Mental Status: Judgment, insight: Intact Orientation: Oriented to time, place, and person Mood and affect: No depression, anxiety, or agitation     DATA REVIEWED: Results  for AJOONI, KARAM (MRN [id number]) as of 02/28/2020 13:08  Ref. Range 02/27/2020 09:13 02/27/2020 13:35  TSH Latest Ref Range: 0.35 - 4.50 uIU/mL 0.22 (L)   T4,Free(Direct) Latest Ref Range: 0.60 - 1.60 ng/dL  1.00  Thyroperoxidase Ab SerPl-aCnc Latest Ref Range: <9 IU/mL 1       Thyroid Ultrasound 01/28/2020  Estimated total number of nodules >/= 1 cm: 2  Number of spongiform nodules >/=  2 cm not described below (TR1): 0  Number of mixed cystic and solid nodules >/= 1.5 cm not described below ([place]): 0  _________________________________________________________  Nodule # 1:  Location: Right; Mid  Maximum size: 1.7 cm; Other 2 dimensions: 0.9 x 1.2 cm  Composition: solid/almost completely solid (2)  Echogenicity: hypoechoic (2)  Shape: not taller-than-wide (0)  Margins: smooth (0)  Echogenic foci: none (0)  ACR TI-RADS total points: 4.  ACR TI-RADS risk category: TR4 (4-6 points).  ACR TI-RADS recommendations:  **Given size (>/= 1.5 cm) and appearance, fine needle aspiration of this moderately suspicious nodule should be considered based on TI-RADS criteria.  _________________________________________________________   Nodule # 2:  Location: Left; Inferior  Maximum size: 1.4 cm; Other 2 dimensions: 0.9 x 0.9 cm  Composition: solid/almost completely solid (2)  Echogenicity: hypoechoic (2)  Shape: not taller-than-wide (0)  Margins: smooth (0)  Echogenic foci: punctate echogenic foci (3)  ACR TI-RADS total points: 7.  ACR TI-RADS risk category: TR5 (>/= 7 points).  ACR TI-RADS recommendations:  **Given size (>/= 1.0 cm) and appearance, fine needle aspiration of this highly suspicious nodule should be considered based on TI-RADS criteria.  FNA left inferior nodule 02/25/2020  DIAGNOSIS:  A. THYROID, LEFT INFERIOR NODULE; ULTRASOUND-GUIDED FINE NEEDLE  ASPIRATION:  - BENIGN (BETHESDA DIAGNOSTIC CATEGORY II).  - BENIGN FOLLICULAR EPITHELIUM, COLLOID, AND CYST CONTENTS.   FNA right mid nodule 02/25/2020 DIAGNOSIS:  A. THYROID, RIGHT MID NODULE; ULTRASOUND-GUIDED FINE NEEDLE ASPIRATION:  - BENIGN (BETHESDA DIAGNOSTIC CATEGORY II).  - BENIGN FOLLICULAR EPITHELIUM, COLLOID, AND CYST CONTENTS.   ASSESSMENT/PLAN/RECOMMENDATIONS:   1. MNG:  - No local neck symptoms  - FNA of the right mid and left inferior nodules are benign  - Reassurance provided at this time  - Anti-TPO Ab negative  - Will repeat ultrasound 01/2021   2. Subclinical Hyperthyroidism:  - Pt has no symptoms of hyperthyroidism - No local neck symptoms  - Most likely this is due to autonomous thyroid nodules  - Will proceed with thyroid uptake and scan  - Of note the pt has CAD albeit non-obstructive , but it will be beneficial to maintain euthyroidism.      Addendum: discussed labs with the pt on 02/28/2020 at 1300  Signed electronically by: Mack Guise, MD  [facility] End[facility]  [address] [place], [place] [address] Phone: [phone] FAX: [phone]   CC: [name], [address] [place]  [place] [address] Phone: [phone] Fax: [phone]   Return to Endocrinology clinic as below: Future Appointments  Date Time Provider [place]  02/27/2020  8:50 AM Dustin Burrill, Melanie Crazier, MD [facility] None  04/07/2020 10:20 AM [facility] DIAG TOMO 1 [facility]  05/07/2020 10:30 AM [facility] None  05/07/2020 11:00 AM Magrinat, Virgie Dad, MD [facility] None  07/03/2020  8:20 AM Jerline Pain, MD [facility]

## 2020-02-28 DIAGNOSIS — E0590 Thyrotoxicosis, unspecified without thyrotoxic crisis or storm: Secondary | ICD-10-CM | POA: Insufficient documentation

## 2020-02-28 DIAGNOSIS — E042 Nontoxic multinodular goiter: Secondary | ICD-10-CM | POA: Insufficient documentation

## 2020-02-28 LAB — THYROID PEROXIDASE ANTIBODY: Thyroperoxidase Ab SerPl-aCnc: 1 IU/mL (ref <9)

## 2020-03-06 ENCOUNTER — Encounter: Payer: Self-pay | Admitting: Internal Medicine

## 2020-03-07 NOTE — Telephone Encounter (Signed)
Courtney Romero,  Could you help this patient? Thank you.

## 2020-03-11 ENCOUNTER — Telehealth: Payer: Self-pay | Admitting: Internal Medicine

## 2020-03-11 NOTE — Telephone Encounter (Signed)
Could you contact patient? Thanks

## 2020-03-11 NOTE — Telephone Encounter (Signed)
Patient is calling to check the status of her Thyroid Scan - please contact her at [phone]

## 2020-03-18 NOTE — Telephone Encounter (Signed)
Pt calling back regarding scheduling thyroid scan. Pt states has not heard back about getting it scheduled. Pt states her insurance is [employer] & no referral required. Please call pt [phone].

## 2020-03-18 NOTE — Telephone Encounter (Signed)
I'm sorry. I have sent this to [place] since last week. I do not know what to do for patient as I have never done these referral.

## 2020-03-21 NOTE — Telephone Encounter (Signed)
This has been resolved-pt is scheduled

## 2020-04-01 ENCOUNTER — Encounter (HOSPITAL_COMMUNITY)
Admission: RE | Admit: 2020-04-01 | Discharge: 2020-04-01 | Disposition: A | Discharge status: Home / Self Care | Source: Ambulatory Visit | Attending: Internal Medicine | Admitting: Internal Medicine

## 2020-04-01 ENCOUNTER — Other Ambulatory Visit: Payer: Self-pay

## 2020-04-01 DIAGNOSIS — E0590 Thyrotoxicosis, unspecified without thyrotoxic crisis or storm: Secondary | ICD-10-CM | POA: Insufficient documentation

## 2020-04-01 DIAGNOSIS — E042 Nontoxic multinodular goiter: Secondary | ICD-10-CM | POA: Diagnosis not present

## 2020-04-01 MED ORDER — SODIUM IODIDE I-123 7.4 MBQ CAPS
480.4000 | ORAL_CAPSULE | Freq: Once | ORAL | Status: AC
  Administered 2020-04-01: 480.4 via ORAL

## 2020-04-02 ENCOUNTER — Other Ambulatory Visit (HOSPITAL_COMMUNITY): Payer: Self-pay

## 2020-04-02 ENCOUNTER — Encounter (HOSPITAL_COMMUNITY)
Admission: RE | Admit: 2020-04-02 | Discharge: 2020-04-02 | Disposition: A | Discharge status: Home / Self Care | Source: Ambulatory Visit | Attending: Internal Medicine | Admitting: Internal Medicine

## 2020-04-02 ENCOUNTER — Encounter: Payer: Self-pay | Admitting: Internal Medicine

## 2020-04-02 MED ORDER — ASPIRIN EC 81 MG PO TBEC: 81.0000 mg | DELAYED_RELEASE_TABLET | Freq: Every day | ORAL | 3 refills | Status: AC

## 2020-04-07 ENCOUNTER — Ambulatory Visit
Admission: RE | Admit: 2020-04-07 | Discharge: 2020-04-07 | Disposition: A | Discharge status: Home / Self Care | Source: Ambulatory Visit | Attending: Oncology | Admitting: Oncology

## 2020-04-07 ENCOUNTER — Other Ambulatory Visit: Payer: Self-pay

## 2020-04-07 DIAGNOSIS — Z170 Estrogen receptor positive status [ER+]: Secondary | ICD-10-CM

## 2020-04-08 ENCOUNTER — Other Ambulatory Visit (HOSPITAL_COMMUNITY)

## 2020-04-08 ENCOUNTER — Encounter (HOSPITAL_COMMUNITY): Admission: RE | Admit: 2020-04-08 | Source: Ambulatory Visit

## 2020-04-09 ENCOUNTER — Other Ambulatory Visit (HOSPITAL_COMMUNITY)

## 2020-04-26 ENCOUNTER — Ambulatory Visit

## 2020-05-06 NOTE — Progress Notes (Signed)
[place]  [phone] [phone]     ID: [name] DOB: 10-15-1960  MR#: [id number]  JYN#:[id number]  Patient Care Team: [name], MD as PCP - General (Unknown Physician Specialty) Jerline Pain, MD as PCP - Cardiology (Cardiology) Reah Justo, Virgie Dad, MD as Consulting Physician (Oncology) Erroll Luna, MD as Consulting Physician (General Surgery) Dermatology, [place] Eppie Gibson, MD as Attending Physician (Radiation Oncology) Delice Bison, Charlestine Massed, NP as Nurse Practitioner (Hematology and Oncology) Benjaman Kindler, MD as Consulting Physician (Obstetrics and Gynecology) [name], MD as Consulting Physician (Pulmonary Disease) Shamleffer, Melanie Crazier, MD as Consulting Physician (Endocrinology) [name], MD as Consulting Physician (Pulmonary Disease) OTHER MD:  CHIEF COMPLAINT: Estrogen receptor positive breast cancer  CURRENT TREATMENT:  Tamoxifen   INTERVAL HISTORY: Courtney Romero returns today for a follow-up of her estrogen receptor positive breast cancer accompanied by her husband Courtney Romero.   She continues on Tamoxifen.  She is tolerating this generally well, with no significant hot flashes or vaginal discharge.  Her problems with leg cramps have largely resolved.  Since her last visit, she underwent bone density screening at the [facility] on 04/04/2020 showing a T-score of -1.7, which is considered osteopenic.  Her prior bone density was -1.5 (2019)  She underwent bilateral diagnostic mammography with tomography at [facility] on 04/07/2020 showing: breast density category B; no evidence of malignancy in either breast.  She was evaluated by Dr. Donneta Romberg left her for thyroid nodules and underwent thyroid ultrasound on 01/28/2020 showing two nodules meeting criteria for biopsy-- nodule #1 in right mid lobe (TR 4), nodule #2 in left inferior lobe (TR 5).  She proceeded to fine needle aspiration of the  two thyroid nodules on 02/25/2020. Cytology from the procedures (left: [id number], right: [id number]) was benign in both samples.  She  tested positive for Covid-19 on 04/14/2020. Recall she was previously infected in 04/2019 and subsequently vaccinated in 08/2019 and 09/2019.  This time she had cough and headaches but no fever.  She never lost her sense of smell.   REVIEW OF SYSTEMS: Courtney Romero still has a cough which she describes as coming from her lower throat, not from her lungs.  It is not productive.  It is not associated with shortness of breath or pleurisy.  She is not exercising regularly.  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  A detailed review of systems was otherwise stable   COVID 19 VACCINATION STATUS: infection 04/2019; [place] x2, most recently 09/2019; re-infection 03/2020   HISTORY OF CURRENT ILLNESS: From the original intake note:   The patient underwent bilateral screening mammography with tomography at the breast center 03/29/2017.  There were some calcifications associated with possible architectural distortion in the left breast.  She was recalled for left unilateral diagnostic mammography with ultrasonography 04/04/2017.  This found the breast density to be category C.  In the upper outer left breast there was a 1.2 cm group of coarse calcifications with persistent distortion.  This was not palpable on exam.  Ultrasound showed a 1.0 cm irregular mass in the left breast at the 11 o'clock position 3 cm from the nipple and also a 1.5 area of posterior acoustic shadowing at the 12 o'clock position 4 cm from the nipple.  The latter was felt to correspond to the area of suspicious calcifications.  Accordingly on 04/08/2017 the patient  underwent biopsy of the 2 areas in question. This showed primaryly low-grade ductal carcinoma in situ, but both biopsies also showed invasive  ductal carcinoma, measuring 2-3 mm on the biopsy slides, of low-grade ductal carcinoma in situ.  Both biopsy site appeared identical.  Only one prognostic panel was sent, from the 11:00 biopsy, showing estrogen epidural 100% positive, estrogen receptor 100% positive, both with strong staining intensity, with an MIB-105%, and no HER-2 amplification, the signals ratio being 1.35 and the number per cell 2.10.  She underwent breast MRI 04/28/2017 showing, in the left breast, an area of mass and non-masslike enhancement overall measuring up to 4 cm.  In addition there were 3 separate lesions in the right breast suspicious for malignancy, requiring additional biopsy.   The patient's subsequent history is as detailed below.   PAST MEDICAL HISTORY: Past Medical History:  Diagnosis Date  . Abnormal CAT scan 07/08/11  . Anxiety   . Cancer ([place])   . Family history of breast cancer   . Family history of pancreatic cancer   . Family history of prostate cancer   . GERD (gastroesophageal reflux disease)   . Hepatitis C 08-Jul-1999   Followed by Dr. Charlean Sanfilippo at [facility]  . History of radiation therapy 07/26/17- 08/22/17   40.05 Gy directed to the left breast in 15 fractions followed by a boost of 10 Gy in 5 fractions.   . Personal history of radiation therapy   . PONV (postoperative nausea and vomiting)     PAST SURGICAL HISTORY: Past Surgical History:  Procedure Laterality Date  . ABDOMINAL HYSTERECTOMY  08-Jul-2007  . breast cyst removal  1995  . BREAST LUMPECTOMY Left   . BREAST LUMPECTOMY WITH RADIOACTIVE SEED AND SENTINEL LYMPH NODE BIOPSY Left 06/14/2017   Procedure: LEFT BREAST SEED LOCALIZED LUMPECTOMY (2 SEEDS) AND SENTINEL LYMPH NODE BIOPSY;  Surgeon: Erroll Luna, MD;  Location: [place];  Service: General;  Laterality: Left;  . COLONOSCOPY  07/07/09  . COLONOSCOPY WITH PROPOFOL N/A 12/12/2017   Procedure: COLONOSCOPY WITH PROPOFOL;  Surgeon: Jonathon Bellows, MD;  Location: [facility] ENDOSCOPY;  Service:  Gastroenterology;  Laterality: N/A;  . ESOPHAGOGASTRODUODENOSCOPY (EGD) WITH PROPOFOL N/A 12/12/2017   Procedure: ESOPHAGOGASTRODUODENOSCOPY (EGD) WITH PROPOFOL;  Surgeon: Jonathon Bellows, MD;  Location: [facility] ENDOSCOPY;  Service: Gastroenterology;  Laterality: N/A;  . FLEXIBLE SIGMOIDOSCOPY  February 12, 2013   have normal perianal exam, question focal prolapse. 2 small scars in the rectum.  Marland Kitchen HEMORRHOID BANDING  07/08/10   3 times over three months  . LEFT HEART CATH AND CORONARY ANGIOGRAPHY N/A 08/27/2019   Procedure: LEFT HEART CATH AND CORONARY ANGIOGRAPHY;  Surgeon: Jolaine Artist, MD;  Location: [place] [facility];  Service: Cardiovascular;  Laterality: N/A;  . SIGMOIDOSCOPY  Jul 08, 2011  . TONSILECTOMY/ADENOIDECTOMY WITH MYRINGOTOMY  remote  . UPPER GI ENDOSCOPY  2011-07-08    FAMILY HISTORY Family History  Problem Relation Age of Onset  . Hypertension Mother   . Thyroid disease Mother        Multi problems  . Coronary artery disease Father   . Heart failure Father   . Atrial fibrillation Father   . Diabetes Brother   As of July 07, 2016, the patient's father is 69 years old. Her mother passed away in 07/07/2017.  The patient has three brothers and no sisters. Her maternal great aunt had breast cancer when she was after 35 and her other maternal great aunt had pancreatic cancer. She has no family history of  ovarian cancer. The patient's father had prostate cancer in his 66's.    GYNECOLOGIC HISTORY:  No LMP recorded. Patient has had a hysterectomy.  Menarche: 61 years old GP: She never carried a pregnancy to term LMP: Hysterectomy remotely, no salpingo-oophorectomy HRT: She was on Estradiol on and off the last three years. 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 She uses gloves when using colorings and other chemicals. Her husband Elijah Birk is retired and was in hosiery. They have one dog. She is not a Geophysical data processor.     ADVANCED DIRECTIVES: In the absence of any  documentation to the contrary, the patient's spouse is their HCPOA.    HEALTH MAINTENANCE: Social History   Tobacco Use  . Smoking status: Former Smoker    Packs/day: 1.50    Years: 20.00    Pack years: 30.00    Types: Cigarettes    Quit date: 04/19/1998    Years since quitting: 22.0  . Smokeless tobacco: Never Used  . Tobacco comment: quit smoking in 2000  Vaping Use  . Vaping Use: Never used  Substance Use Topics  . Alcohol use: No  . Drug use: No     Colonoscopy: [facility] in [place]  PAP: Status post hysterectomy  Bone density: at 51 --does not know results   Allergies  Allergen Reactions  . Propoxyphene Rash    Uncoded Allergy. Allergen: mellaril, Other Reaction: Other reaction, double vision Uncoded Allergy. Allergen: mellaril, Other Reaction: Other reaction, double vision Uncoded Allergy. Allergen: mellaril, Other Reaction: Other reaction, double vision  . Thioridazine Hcl     REACTION: Reaction not known  . Bupropion Rash and Other (See Comments)    Insomnia Insomnia  . Citalopram Rash and Other (See Comments)    Lightheaded Lightheaded  . Duloxetine Rash and Other (See Comments)    Insomnia Insomnia  . Thioridazine Rash    REACTION: Reaction not known REACTION: Reaction not known    Current Outpatient Medications  Medication Sig Dispense Refill  . aspirin EC 81 MG tablet Take 1 tablet (81 mg total) by mouth daily. 90 tablet 3  . Calcium Carbonate (CALCIUM 600 PO) Take 600 mg by mouth daily.     . cholecalciferol (VITAMIN D) 1000 units tablet Take 1,000 Units by mouth daily.    Marland Kitchen LORazepam (ATIVAN) 0.5 MG tablet Take 0.5 mg by mouth every 8 (eight) hours as needed for anxiety.     . Melatonin 5 MG TABS Take 5 mg by mouth at bedtime.     Marland Kitchen omeprazole (PRILOSEC) 40 MG capsule Take 40 mg by mouth daily.    . rosuvastatin (CRESTOR) 20 MG tablet Take 1 tablet (20 mg total) by mouth daily. 90 tablet 3  . tamoxifen (NOLVADEX) 20 MG tablet Take 1  tablet (20 mg total) by mouth daily. 90 tablet 5  . zolpidem (AMBIEN) 5 MG tablet Take 5 mg by mouth at bedtime as needed for sleep.      No current facility-administered medications for this visit.    OBJECTIVE: White Romero who appears younger than stated age  Vitals:   05/07/20 1050  BP: 131/80  Pulse: 88  Resp: 20  Temp: (!) 97 F (36.1 C)  SpO2: 100%     Body mass index is 25.58 kg/m.   Wt Readings from Last 3 Encounters:  05/07/20 163 lb 4.8 oz (74.1 kg)  02/27/20 158 lb 8 oz (71.9 kg)  01/17/20  158 lb 12.8 oz (72 kg)      ECOG FS:1 - Symptomatic but completely ambulatory  Sclerae unicteric, EOMs intact Wearing a mask No cervical or supraclavicular adenopathy Lungs no rales or rhonchi Heart regular rate and rhythm Abd soft, nontender, positive bowel sounds MSK no focal spinal tenderness, no upper extremity lymphedema Neuro: nonfocal, well oriented, appropriate affect Breasts: The right breast is benign.  The left breast has undergone lumpectomy and radiation.  There is no evidence of disease recurrence.  Both axillae are benign   LAB RESULTS:  CMP     Component Value Date/Time   NA 143 08/24/2019 1019   K 3.8 08/24/2019 1019   CL 111 08/24/2019 1019   CO2 23 08/24/2019 1019   GLUCOSE 105 (H) 08/24/2019 1019   BUN 12 08/24/2019 1019   CREATININE 0.90 08/24/2019 1019   CREATININE 0.91 04/29/2017 1348   CALCIUM 9.1 08/24/2019 1019   PROT 7.2 06/07/2019 1428   ALBUMIN 4.0 06/07/2019 1428   AST 17 06/07/2019 1428   AST 15 04/29/2017 1348   ALT 15 06/07/2019 1428   ALT 14 04/29/2017 1348   ALKPHOS 49 06/07/2019 1428   BILITOT 0.4 06/07/2019 1428   BILITOT 0.7 04/29/2017 1348   GFRNONAA >60 08/24/2019 1019   GFRNONAA >60 04/29/2017 1348   GFRAA >60 08/24/2019 1019   GFRAA >60 04/29/2017 1348    No results found for: TOTALPROTELP, ALBUMINELP, A1GS, A2GS, BETS, BETA2SER, GAMS, MSPIKE, SPEI  No results found for: KPAFRELGTCHN, LAMBDASER,  KAPLAMBRATIO  Lab Results  Component Value Date   WBC 4.7 05/07/2020   NEUTROABS 2.7 05/07/2020   HGB 13.6 05/07/2020   HCT 41.3 05/07/2020   MCV 92.6 05/07/2020   PLT 148 (L) 05/07/2020   No results found for: LABCA2  No components found for: JOINOM767  No results for input(s): INR in the last 168 hours.  No results found for: LABCA2  No results found for: MCN470  No results found for: JGG836  No results found for: OQH476  No results found for: CA2729  No components found for: HGQUANT  No results found for: CEA1 / No results found for: CEA1   No results found for: AFPTUMOR  No results found for: CHROMOGRNA   No results found for: HGBA, HGBA2QUANT, HGBFQUANT, HGBSQUAN (Hemoglobinopathy evaluation)   No results found for: LDH  No results found for: IRON, TIBC, IRONPCTSAT (Iron and TIBC)  No results found for: FERRITIN  Urinalysis    Component Value Date/Time   COLORURINE YELLOW (A) 06/12/2017 1911   APPEARANCEUR HAZY (A) 06/12/2017 1911   LABSPEC 1.025 06/12/2017 1911   PHURINE 5.0 06/12/2017 1911   GLUCOSEU NEGATIVE 06/12/2017 1911   HGBUR NEGATIVE 06/12/2017 1911   BILIRUBINUR NEGATIVE 06/12/2017 1911   KETONESUR NEGATIVE 06/12/2017 1911   PROTEINUR NEGATIVE 06/12/2017 1911   UROBILINOGEN 0.2 08/17/2011 0902   NITRITE NEGATIVE 06/12/2017 1911   LEUKOCYTESUR NEGATIVE 06/12/2017 1911    STUDIES: No results found.   ELIGIBLE FOR AVAILABLE RESEARCH PROTOCOL: no  ASSESSMENT: 60 y.o. Courtney Romero, Courtney Romero status post biopsy of 2 areas in the upper outer quadrant of the left breast 04/08/2017, both showing extensive ductal carcinoma in situ low-grade, but both showing invasive ductal carcinoma (measuring 2-3 mm on the slides), grade 1, estrogen and progesterone receptor positive, HER-2 not amplified, with an MIB-1 of 5% -- T1a N0  (1) left lumpectomy and sentinel lymph node sampling 06/14/2017 showed only residual ductal carcinoma in situ, low-grade, with  negative margins.  (  a) a total of 3 axillary lymph nodes were removed  (2) adjuvant radiation 07/26/2017-08/22/2017 Site/dose:40.05 Gy directed to the left breast in 15 fractions followed by a boost of 10 Gy in 5 fractions  (3) genetics testing 07/12/2017 through theCommon Hereditary Cancer Panel offered by [facility] i found no deleterious mutations in APC, ATM, AXIN2, BARD1, BMPR1A, BRCA1, BRCA2, BRIP1, CDH1, CDKN2A (p14ARF), CDKN2A (p16INK4a), CKD4, CHEK2, CTNNA1, DICER1, EPCAM (Deletion/duplication testing only), GREM1 (promoter region deletion/duplication testing only), KIT, MEN1, MLH1, MSH2, MSH3, MSH6, MUTYH, NBN, NF1, NHTL1, PALB2, PDGFRA, PMS2, POLD1, POLE, PTEN, RAD50, RAD51C, RAD51D, SDHB, SDHC, SDHD, SMAD4, SMARCA4. S  (5) tamoxifen started neoadjuvantly 04/29/2017   (6) chest CT 02/15/2018 showed a 12 mm left axillary node not noted on mammography 04/03/2018  (a) repeat chest CT scan 11/08/2018 shows resolution of the previously noted airspace disease, reduction in the left axillary soft tissue, and essentially no evidence of metastatic disease.  (7) history hepC: s/p ledipasvir/sofosbuvir ([place])   PLAN: Courtney Romero is now just about 3 years out from definitive surgery for her breast cancer with no evidence of disease recurrence.  This is very favorable.  She is tolerating tamoxifen well and the plan is to continue that for a total of 5 years.  If she wanted to continue it beyond 5 years for bone density issues of course that would be an option for her but it is unlikely to make much difference in terms of breast cancer recurrence since her prognosis at baseline is already so good.  We discussed that she will see my partner Dr. Lindi Adie January of next year after her December mammography this year.  We discussed her bone density results obtained at [facility].  I do not think she needs Fosamax or similar drugs since her T score is more positive than -2.0, but I have encouraged her to walk  45 minutes daily at least 5 days a week  Total encounter time 25 minutes.*  Akela Pocius, Virgie Dad, MD  05/07/20 11:20 AM Medical Oncology and Hematology [facility] [place], [place] [address] Tel. [phone]    Fax. [phone]   I, Wilburn Mylar, am acting as scribe for Dr. Virgie Dad. Hilmer Aliberti.  I, Lurline Del MD, have reviewed the above documentation for accuracy and completeness, and I agree with the above.   *Total Encounter Time as defined by the Centers for Medicare and Medicaid Services includes, in addition to the face-to-face time of a patient visit (documented in the note above) non-face-to-face time: obtaining and reviewing outside history, ordering and reviewing medications, tests or procedures, care coordination (communications with other health care professionals or caregivers) and documentation in the medical record.

## 2020-05-07 ENCOUNTER — Other Ambulatory Visit: Payer: Self-pay

## 2020-05-07 ENCOUNTER — Inpatient Hospital Stay

## 2020-05-07 ENCOUNTER — Inpatient Hospital Stay: Attending: Oncology | Admitting: Oncology

## 2020-05-07 VITALS — BP 131/80 | HR 88 | Temp 97.0°F | Resp 20 | Ht 67.0 in | Wt 163.3 lb

## 2020-05-07 DIAGNOSIS — C50412 Malignant neoplasm of upper-outer quadrant of left female breast: Secondary | ICD-10-CM | POA: Insufficient documentation

## 2020-05-07 DIAGNOSIS — B182 Chronic viral hepatitis C: Secondary | ICD-10-CM | POA: Diagnosis not present

## 2020-05-07 DIAGNOSIS — I700 Atherosclerosis of aorta: Secondary | ICD-10-CM | POA: Diagnosis not present

## 2020-05-07 DIAGNOSIS — Z79810 Long term (current) use of selective estrogen receptor modulators (SERMs): Secondary | ICD-10-CM | POA: Insufficient documentation

## 2020-05-07 DIAGNOSIS — Z170 Estrogen receptor positive status [ER+]: Secondary | ICD-10-CM | POA: Insufficient documentation

## 2020-05-07 DIAGNOSIS — K6389 Other specified diseases of intestine: Secondary | ICD-10-CM

## 2020-05-07 DIAGNOSIS — C50212 Malignant neoplasm of upper-inner quadrant of left female breast: Secondary | ICD-10-CM

## 2020-05-07 LAB — CBC WITH DIFFERENTIAL/PLATELET
Abs Immature Granulocytes: 0.01 10*3/uL (ref 0.00–0.07)
Basophils Absolute: 0 10*3/uL (ref 0.0–0.1)
Basophils Relative: 0 %
Eosinophils Absolute: 0.1 10*3/uL (ref 0.0–0.5)
Eosinophils Relative: 2 %
HCT: 41.3 % (ref 36.0–46.0)
Hemoglobin: 13.6 g/dL (ref 12.0–15.0)
Immature Granulocytes: 0 %
Lymphocytes Relative: 33 %
Lymphs Abs: 1.5 10*3/uL (ref 0.7–4.0)
MCH: 30.5 pg (ref 26.0–34.0)
MCHC: 32.9 g/dL (ref 30.0–36.0)
MCV: 92.6 fL (ref 80.0–100.0)
Monocytes Absolute: 0.3 10*3/uL (ref 0.1–1.0)
Monocytes Relative: 6 %
Neutro Abs: 2.7 10*3/uL (ref 1.7–7.7)
Neutrophils Relative %: 59 %
Platelets: 148 10*3/uL — ABNORMAL LOW (ref 150–400)
RBC: 4.46 MIL/uL (ref 3.87–5.11)
RDW: 13.1 % (ref 11.5–15.5)
WBC: 4.7 10*3/uL (ref 4.0–10.5)
nRBC: 0 % (ref 0.0–0.2)

## 2020-05-07 LAB — COMPREHENSIVE METABOLIC PANEL
ALT: 17 U/L (ref 0–44)
AST: 18 U/L (ref 15–41)
Albumin: 3.8 g/dL (ref 3.5–5.0)
Alkaline Phosphatase: 54 U/L (ref 38–126)
Anion gap: 8 (ref 5–15)
BUN: 13 mg/dL (ref 6–20)
CO2: 26 mmol/L (ref 22–32)
Calcium: 9.3 mg/dL (ref 8.9–10.3)
Chloride: 109 mmol/L (ref 98–111)
Creatinine, Ser: 0.81 mg/dL (ref 0.44–1.00)
GFR, Estimated: >60 mL/min (ref >60)
Glucose, Bld: 120 mg/dL — ABNORMAL HIGH (ref 70–99)
Potassium: 4.2 mmol/L (ref 3.5–5.1)
Sodium: 143 mmol/L (ref 135–145)
Total Bilirubin: 0.6 mg/dL (ref 0.3–1.2)
Total Protein: 7.5 g/dL (ref 6.5–8.1)

## 2020-07-03 ENCOUNTER — Ambulatory Visit: Admitting: Cardiology

## 2020-07-04 ENCOUNTER — Other Ambulatory Visit: Payer: Self-pay

## 2020-07-04 ENCOUNTER — Encounter: Payer: Self-pay | Admitting: Oncology

## 2020-07-04 MED ORDER — TAMOXIFEN CITRATE 20 MG PO TABS: 20.0000 mg | ORAL_TABLET | Freq: Every day | ORAL | 5 refills | Status: DC

## 2020-07-23 ENCOUNTER — Ambulatory Visit: Admitting: Internal Medicine

## 2020-08-29 ENCOUNTER — Ambulatory Visit: Admitting: Cardiology

## 2020-10-14 ENCOUNTER — Other Ambulatory Visit: Payer: Self-pay

## 2020-10-14 ENCOUNTER — Ambulatory Visit (INDEPENDENT_AMBULATORY_CARE_PROVIDER_SITE_OTHER): Admitting: Podiatry

## 2020-10-14 ENCOUNTER — Ambulatory Visit (INDEPENDENT_AMBULATORY_CARE_PROVIDER_SITE_OTHER)

## 2020-10-14 DIAGNOSIS — G5763 Lesion of plantar nerve, bilateral lower limbs: Secondary | ICD-10-CM | POA: Diagnosis not present

## 2020-10-14 NOTE — Progress Notes (Signed)
   HPI: 60 y.o. female presenting today as a new patient for evaluation of bilateral forefoot pain has been going on for several months.  She states that it has progressively gotten worse.  She denies a history of injury.  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  She notices significant pain to the bilateral forefoot.  She has not done anything for treatment.  She presents for further treatment and evaluation  Past Medical History:  Diagnosis Date   Abnormal CAT scan 2013   Anxiety    Cancer [facility])    Family history of breast cancer    Family history of pancreatic cancer    Family history of prostate cancer    GERD (gastroesophageal reflux disease)    Hepatitis C 2001   Followed by Dr. Charlean Sanfilippo at [facility]   History of radiation therapy 07/26/17- 08/22/17   40.05 Gy directed to the left breast in 15 fractions followed by a boost of 10 Gy in 5 fractions.    Personal history of radiation therapy    PONV (postoperative nausea and vomiting)      Physical Exam: General: The patient is alert and oriented x3 in no acute distress.  Dermatology: Skin is warm, dry and supple bilateral lower extremities. Negative for open lesions or macerations.  Vascular: Palpable pedal pulses bilaterally. No edema or erythema noted. Capillary refill within normal limits.  Neurological: Epicritic and protective threshold grossly intact bilaterally.   Musculoskeletal Exam: Range of motion within normal limits to all pedal and ankle joints bilateral. Muscle strength 5/5 in all groups bilateral.  Pain on palpation to the bilateral forefoot diffusely throughout the metatarsal heads consistent with a metatarsalgia  Radiographic Exam:  Normal osseous mineralization. Joint spaces preserved. No fracture/dislocation/boney destruction.    Assessment: 1.  Metatarsalgia bilateral forefoot   Plan of Care:  1. Patient evaluated. X-Rays reviewed.  2.  Prescription was provided  for the patient to take to [facility] lab for custom molded orthotics with metatarsal offloading pads to offload pressure from the forefoot. 3.  I explained to the patient that structurally there is nothing wrong with her foot however the way her foot function she puts to significant amount of pressure to the forefoot.  Orthotics would be her best chance of alleviating that pressure in alleviating her pain 4.  Return to clinic as needed     Edrick Kins, DPM [facility]  Dr. Edrick Kins, DPM    [address], [place] [address]                Office [phone]  Fax [phone]

## 2020-11-14 ENCOUNTER — Ambulatory Visit: Attending: Internal Medicine

## 2020-11-14 ENCOUNTER — Other Ambulatory Visit: Payer: Self-pay

## 2020-11-14 DIAGNOSIS — Z23 Encounter for immunization: Secondary | ICD-10-CM

## 2020-11-14 MED ORDER — PFIZER-BIONT COVID-19 VAC-TRIS 30 MCG/0.3ML IM SUSP
INTRAMUSCULAR | 0 refills | Status: DC
  Filled 2020-11-14: qty 0.3, 1d supply, fill #0

## 2020-11-14 NOTE — Progress Notes (Signed)
   Covid-19 Vaccination Clinic  Name:  Courtney Romero    MRN: [id number] DOB: [birth year]  11/14/2020  Ms. [name] was observed post Covid-19 immunization for 15 minutes without incident. She was provided with Vaccine Information Sheet and instruction to access the V-Safe system.   Ms. [name] was instructed to call 911 with any severe reactions post vaccine: Difficulty breathing  Swelling of face and throat  A fast heartbeat  A bad rash all over body  Dizziness and weakness   Immunizations Administered     Name Date Dose VIS Date Route   PFIZER Comrnaty(Gray TOP) Covid-19 Vaccine 11/14/2020  9:00 AM 0.3 mL 03/27/2020 Intramuscular   Manufacturer: [place]   Lot: [id number]   [place]: [place], PharmD, MBA Clinical Acute Care Pharmacist

## 2020-11-21 ENCOUNTER — Other Ambulatory Visit: Payer: Self-pay

## 2020-11-21 ENCOUNTER — Ambulatory Visit (INDEPENDENT_AMBULATORY_CARE_PROVIDER_SITE_OTHER): Admitting: Cardiology

## 2020-11-21 ENCOUNTER — Encounter: Payer: Self-pay | Admitting: Cardiology

## 2020-11-21 VITALS — BP 130/90 | HR 69 | Ht 67.0 in | Wt 160.4 lb

## 2020-11-21 DIAGNOSIS — E041 Nontoxic single thyroid nodule: Secondary | ICD-10-CM

## 2020-11-21 DIAGNOSIS — I2510 Atherosclerotic heart disease of native coronary artery without angina pectoris: Secondary | ICD-10-CM | POA: Diagnosis not present

## 2020-11-21 DIAGNOSIS — I739 Peripheral vascular disease, unspecified: Secondary | ICD-10-CM

## 2020-11-21 NOTE — Progress Notes (Signed)
Cardiology Office Note:    Date:  11/21/2020   ID:  SCHELBY LAFLASH, DOB 04-24-1960, MRN [id number]  PCP:  [name], MD   [place] [facility] Cardiologist:  Candee Furbish, MD     Referring MD: [name], MD    History of Present Illness:    Courtney Romero is a 60 y.o. female here for follow-up of nonobstructive coronary artery disease.   On 08/27/2019 when she underwent cardiac catheterization, personally reviewed that revealed the following: Prox RCA to Mid RCA lesion is 30% stenosed. Prox LAD to Mid LAD lesion is 20% stenosed.   Findings:   1, Mild non-obstructive CAD 2. LVEF 60-65%   Plan:   Medical therapy.   Prior to cardiac catheterization on 07/23/2019 she underwent coronary CT scan which showed the following: 1. Coronary artery calcium score 566 Agatston units. This places the patient in the 99th percentile for age and gender, suggesting high risk for future cardiac events.   2. Extensive plaque in the coronaries, mainly mild stenosis but possible moderate stenosis in the proximal LAD at the take-off of D2.    Covid infection in January 2021.   She has had GI evaluation for epigastric discomfort as well.  Pain can happen every day.  Can last an hour a few minutes.  Walking on the treadmill.  Walks 2 miles without any chest pain.  She is very worried about heart attack possibilities.   Courtney Romero, her husband.  Patient of Dr. Clayborne Dana for several years.   Her father had his first heart attack at age 60, currently 60 years old with CABG and pacemaker followed by Dr. Caryl Comes.  Her brother recently had 2 stents placed at age 56.   Overall she has been doing fairly well.  She still get occasional chest discomfort or shortness of breath especially in the setting of anxiety.  She does have Ativan for periodic anxiety.  Past Medical History:  Diagnosis Date   Abnormal CAT scan 2013   Anxiety    Cancer [facility])    Family history of breast  cancer    Family history of pancreatic cancer    Family history of prostate cancer    GERD (gastroesophageal reflux disease)    Hepatitis C 2001   Followed by Dr. Charlean Sanfilippo at [facility]   History of radiation therapy 07/26/17- 08/22/17   40.05 Gy directed to the left breast in 15 fractions followed by a boost of 10 Gy in 5 fractions.    Personal history of radiation therapy    PONV (postoperative nausea and vomiting)     Past Surgical History:  Procedure Laterality Date   ABDOMINAL HYSTERECTOMY  2009   breast cyst removal  1995   BREAST LUMPECTOMY Left    BREAST LUMPECTOMY WITH RADIOACTIVE SEED AND SENTINEL LYMPH NODE BIOPSY Left 06/14/2017   Procedure: LEFT BREAST SEED LOCALIZED LUMPECTOMY (2 SEEDS) AND SENTINEL LYMPH NODE BIOPSY;  Surgeon: Erroll Luna, MD;  Location: [place];  Service: General;  Laterality: Left;   COLONOSCOPY  2011   COLONOSCOPY WITH PROPOFOL N/A 12/12/2017   Procedure: COLONOSCOPY WITH PROPOFOL;  Surgeon: Jonathon Bellows, MD;  Location: [facility] ENDOSCOPY;  Service: Gastroenterology;  Laterality: N/A;   ESOPHAGOGASTRODUODENOSCOPY (EGD) WITH PROPOFOL N/A 12/12/2017   Procedure: ESOPHAGOGASTRODUODENOSCOPY (EGD) WITH PROPOFOL;  Surgeon: Jonathon Bellows, MD;  Location: [facility] ENDOSCOPY;  Service: Gastroenterology;  Laterality: N/A;   FLEXIBLE SIGMOIDOSCOPY  February 12, 2013   have normal perianal exam, question focal  prolapse. 2 small scars in the rectum.   HEMORRHOID BANDING  2012   3 times over three months   LEFT HEART CATH AND CORONARY ANGIOGRAPHY N/A 08/27/2019   Procedure: LEFT HEART CATH AND CORONARY ANGIOGRAPHY;  Surgeon: Jolaine Artist, MD;  Location: [place] [facility];  Service: Cardiovascular;  Laterality: N/A;   SIGMOIDOSCOPY  2013   TONSILECTOMY/ADENOIDECTOMY WITH MYRINGOTOMY  remote   UPPER GI ENDOSCOPY  2013    Current Medications: Current Meds  Medication Sig   aspirin EC 81 MG tablet Take 1 tablet (81 mg total) by mouth daily.   Calcium  Carbonate (CALCIUM 600 PO) Take 600 mg by mouth daily.    cholecalciferol (VITAMIN D) 1000 units tablet Take 1,000 Units by mouth daily.   LORazepam (ATIVAN) 0.5 MG tablet Take 0.5 mg by mouth every 8 (eight) hours as needed for anxiety.    Melatonin 5 MG TABS Take 5 mg by mouth at bedtime.    omeprazole (PRILOSEC) 40 MG capsule Take 40 mg by mouth daily.   rosuvastatin (CRESTOR) 20 MG tablet Take 1 tablet (20 mg total) by mouth daily.   tamoxifen (NOLVADEX) 20 MG tablet Take 1 tablet (20 mg total) by mouth daily.     Allergies:   Propoxyphene, Thioridazine hcl, Bupropion, Citalopram, Duloxetine, and Thioridazine   Social History   Socioeconomic History   Marital status: Married    Spouse name: Not on file   Number of children: Not on file   Years of education: Not on file   Highest education level: Not on file  Occupational History   Occupation: Full Time  Tobacco Use   Smoking status: Former    Packs/day: 1.50    Years: 20.00    Pack years: 30.00    Types: Cigarettes    Quit date: 04/19/1998    Years since quitting: 22.6   Smokeless tobacco: Never   Tobacco comments:    quit smoking in 2000  Vaping Use   Vaping Use: Never used  Substance and Sexual Activity   Alcohol use: No   Drug use: No   Sexual activity: Yes  Other Topics Concern   Not on file  Social History Narrative   Regular exercise: Yes   Social Determinants of Health   Financial Resource Strain: Not on file  Food Insecurity: Not on file  Transportation Needs: Not on file  Physical Activity: Not on file  Stress: Not on file  Social Connections: Not on file     Family History: The patient's family history includes Atrial fibrillation in her father; Coronary artery disease in her father; Diabetes in her brother; Heart failure in her father; Hypertension in her mother; Thyroid disease in her mother.  ROS:   Please see the history of present illness.     All other systems reviewed and are  negative.  EKGs/Labs/Other Studies Reviewed:    The following studies were reviewed today: Prior heart catheterization and CT scan reviewed as above.  EKG:  EKG is  ordered today.  The ekg ordered today demonstrates sinus rhythm 69 with no other abnormalities  Recent Labs: 02/27/2020: TSH 0.22 05/07/2020: ALT 17; BUN 13; Creatinine, Ser 0.81; Hemoglobin 13.6; Platelets 148; Potassium 4.2; Sodium 143  Recent Lipid Panel    Component Value Date/Time   CHOL 133 01/21/2020 0847   TRIG 95 01/21/2020 0847   HDL 74 01/21/2020 0847   CHOLHDL 1.8 01/21/2020 0847   LDLCALC 41 01/21/2020 0847     Risk  Assessment/Calculations:          Physical Exam:    VS:  BP 130/90   Pulse 69   Ht '5\' 7"'$  (1.702 m)   Wt 160 lb 6.4 oz (72.8 kg)   SpO2 97%   BMI 25.12 kg/m     Wt Readings from Last 3 Encounters:  11/21/20 160 lb 6.4 oz (72.8 kg)  05/07/20 163 lb 4.8 oz (74.1 kg)  02/27/20 158 lb 8 oz (71.9 kg)     GEN:  Well nourished, well developed in no acute distress HEENT: Normal NECK: No JVD; No carotid bruits LYMPHATICS: No lymphadenopathy CARDIAC: RRR, no murmurs, rubs, gallops RESPIRATORY:  Clear to auscultation without rales, wheezing or rhonchi  ABDOMEN: Soft, non-tender, non-distended MUSCULOSKELETAL:  No edema; No deformity  SKIN: Warm and dry NEUROLOGIC:  Alert and oriented x 3 PSYCHIATRIC:  Normal affect   ASSESSMENT:    1. CAD in native artery   2. PVD (peripheral vascular disease) ([place])   3. Thyroid nodule    PLAN:    In order of problems listed above:  Coronary artery disease - Calcified plaque.  Aggressive secondary prevention.-Doing well with aspirin as well as Crestor 20.  LDL in the 40s upon last check last year from outside labs.  Wonderful.  No myalgias.  Continue with daily exercise.  Peripheral vascular disease - Abdominal aortic atherosclerosis as well as at the bifurcation of the iliacs.  No claudication.  No evidence of significant carotid artery  disease.  Lead to thyroid biopsy.  Overall reassurance.  Benign.      Medication Adjustments/Labs and Tests Ordered: Current medicines are reviewed at length with the patient today.  Concerns regarding medicines are outlined above.  Orders Placed This Encounter  Procedures   EKG 12-Lead   No orders of the defined types were placed in this encounter.   Patient Instructions  Medication Instructions:  The current medical regimen is effective;  continue present plan and medications.  *If you need a refill on your cardiac medications before your next appointment, please call your pharmacy*  Follow-Up: At [facility], you and your health needs are our priority.  As part of our continuing mission to provide you with exceptional heart care, we have created designated Provider Care Teams.  These Care Teams include your primary Cardiologist (physician) and Advanced Practice Providers (APPs -  Physician Assistants and Nurse Practitioners) who all work together to provide you with the care you need, when you need it.  We recommend signing up for the patient portal called "MyChart".  Sign up information is provided on this After Visit Summary.  MyChart is used to connect with patients for Virtual Visits (Telemedicine).  Patients are able to view lab/test results, encounter notes, upcoming appointments, etc.  Non-urgent messages can be sent to your provider as well.   To learn more about what you can do with MyChart, go to [email or web address].    Your next appointment:   1 year(s)  The format for your next appointment:   In Person  Provider:   Candee Furbish, MD  Thank you for choosing [facility]!!     Signed, Candee Furbish, MD  11/21/2020 4:59 PM    [place]

## 2020-11-21 NOTE — Patient Instructions (Signed)
Medication Instructions:  The current medical regimen is effective;  continue present plan and medications.  *If you need a refill on your cardiac medications before your next appointment, please call your pharmacy*  Follow-Up: At [facility], you and your health needs are our priority.  As part of our continuing mission to provide you with exceptional heart care, we have created designated Provider Care Teams.  These Care Teams include your primary Cardiologist (physician) and Advanced Practice Providers (APPs -  Physician Assistants and Nurse Practitioners) who all work together to provide you with the care you need, when you need it.  We recommend signing up for the patient portal called "MyChart".  Sign up information is provided on this After Visit Summary.  MyChart is used to connect with patients for Virtual Visits (Telemedicine).  Patients are able to view lab/test results, encounter notes, upcoming appointments, etc.  Non-urgent messages can be sent to your provider as well.   To learn more about what you can do with MyChart, go to [email or web address].    Your next appointment:   1 year(s)  The format for your next appointment:   In Person  Provider:   Mark Skains, MD   Thank you for choosing [facility]!!    

## 2021-01-26 ENCOUNTER — Other Ambulatory Visit: Payer: Self-pay | Admitting: Hematology and Oncology

## 2021-01-26 DIAGNOSIS — Z9889 Other specified postprocedural states: Secondary | ICD-10-CM

## 2021-02-09 ENCOUNTER — Other Ambulatory Visit: Payer: Self-pay | Admitting: Cardiology

## 2021-02-26 ENCOUNTER — Ambulatory Visit: Admitting: Internal Medicine

## 2021-03-03 ENCOUNTER — Other Ambulatory Visit: Payer: Self-pay

## 2021-03-03 ENCOUNTER — Ambulatory Visit (INDEPENDENT_AMBULATORY_CARE_PROVIDER_SITE_OTHER): Admitting: Internal Medicine

## 2021-03-03 ENCOUNTER — Encounter: Payer: Self-pay | Admitting: Internal Medicine

## 2021-03-03 VITALS — BP 118/82 | HR 75 | Temp 97.8°F | Ht 67.0 in | Wt 158.8 lb

## 2021-03-03 DIAGNOSIS — R918 Other nonspecific abnormal finding of lung field: Secondary | ICD-10-CM | POA: Diagnosis not present

## 2021-03-03 NOTE — Patient Instructions (Signed)
CT chest to assess for changes in nodules

## 2021-03-03 NOTE — Progress Notes (Signed)
[place] [facility] Consultation      Date: 03/03/2021,   MRN# [id number] Courtney Romero 07-06-1960      Patient profile 60 yo Patient is former smoker, states that she has irritation back of throat and sometimes in middle of chest Smoked 1.5 PPD for 20 years She works at [employer] and exposed to hair sprays all day long  Patient also with h/o 2 R lung nodules subcentimeter RML,RLL stable in size First scan in 03/2015 and second scan in 06/2015 shows no change in nodule size and no acute findings Third CT scan 02/2016 shows stable lung nodules since 03/2015  diagnosed with breast cancer status post lumpectomy in February 2019 Patient underwent radiation therapy 20 treatments in May 2019   Office SPiro shows NORMAL SPIRO Ratio 84% Fev1 3.1 L 104% FVC 3.7 97%  PFT 02/14/18 Ratio 82% [id number]% predicted No evidence of obstructive or restrictive lung disease   CHIEF COMPLAINT:   Follow-up pneumonia Follow-up CT chest    HISTORY OF PRESENT ILLNESS    No exacerbation at this time No evidence of heart failure at this time No evidence or signs of infection at this time No respiratory distress No fevers, chills, nausea, vomiting, diarrhea No evidence of lower extremity edema No evidence hemoptysis    History of pneumonia CT chest 2019 right middle lobe opacity Resolved with antibiotics with Z-Pak follow-up CT chest showed resolution of pneumonia   Follow-up CT chest July 2020-complete resolution of the right middle lobe opacity Patient has stable subcentimeter right lower lobe lung nodule 7 mm and 3 mm nodules Stable over the last 3 years  Inhaler usage at this time Previous pulmonary function test does not show evidence of obstructive or restrictive lung disease  Recommend repeat CT chest for interval assessment   Current Medication:  Current Outpatient Medications:    aspirin EC 81 MG tablet, Take 1 tablet (81 mg total) by mouth daily.,  Disp: 90 tablet, Rfl: 3   Calcium Carbonate (CALCIUM 600 PO), Take 600 mg by mouth daily. , Disp: , Rfl:    cholecalciferol (VITAMIN D) 1000 units tablet, Take 1,000 Units by mouth daily., Disp: , Rfl:    CRESTOR 20 MG tablet, TAKE ONE TABLET BY MOUTH EVERY DAY, Disp: 90 tablet, Rfl: 3   LORazepam (ATIVAN) 0.5 MG tablet, Take 0.5 mg by mouth every 8 (eight) hours as needed for anxiety. , Disp: , Rfl:    Melatonin 5 MG TABS, Take 5 mg by mouth at bedtime. , Disp: , Rfl:    omeprazole (PRILOSEC) 40 MG capsule, Take 40 mg by mouth daily., Disp: , Rfl:    tamoxifen (NOLVADEX) 20 MG tablet, Take 1 tablet (20 mg total) by mouth daily., Disp: 90 tablet, Rfl: 5   zolpidem (AMBIEN) 5 MG tablet, Take 5 mg by mouth at bedtime as needed for sleep. , Disp: , Rfl:     ALLERGIES   Propoxyphene, Thioridazine hcl, Bupropion, Citalopram, Duloxetine, and Thioridazine     REVIEW OF SYSTEMS      Review of Systems:  Gen:  Denies  fever, sweats, chills weight loss  HEENT: Denies blurred vision, double vision, ear pain, eye pain, hearing loss, nose bleeds, sore throat Cardiac:  No dizziness, chest pain or heaviness, chest tightness,edema, No JVD Resp:   No cough, -sputum production, -shortness of breath,-wheezing, -hemoptysis,  Other:  All other systems negative  BP 118/82 (BP Location: Left Arm, Patient Position: Sitting, Cuff Size: Normal)   Pulse  75   Temp 97.8 F (36.6 C) (Oral)   Ht 5\' 7"  (1.702 m)   Wt 158 lb 12.8 oz (72 kg)   SpO2 100%   BMI 24.87 kg/m   .vs Physical Examination:   General Appearance: No distress  EYES PERRLA, EOM intact.   NECK Supple, No JVD Pulmonary: normal breath sounds, No wheezing.  CardiovascularNormal S1,S2.  No m/r/g.   ALL OTHER ROS ARE NEGATIVE        IMAGING   CT Chest 02/2016 Pulmonary nodules measure 7 mm or less in size and are stable from 03/28/2015.  CT chest 01/2018 Right middle lobe opacification Findings consistent with  pneumonia Im CT chest [age] ages reveiwed  CT chest 11/08/2018   CT chest 11/08/2018 CT chest 11/08/2018  Images reviewed today  interval resolution of previously seen consolidative airspace opacity of the medial segment right middle lobe.  Stable benign small pulmonary nodule of the right lower lobe measuring 7 mm  Stable benign 3 mm nodule of the right lower, CT scans reviewed for the last 7 years patient does not need any more scans at this time CT chest reviewed for the last several years   ASSESSMENT/PLAN   60 year old pleasant white female seen today for follow-up abnormal CT chest from 1 year ago Resolution of right middle lobe opacification consistent with pneumonia Patient with previous history of 7 mm and 3 mm lung nodules which have been stable over the last 3 years, will repeat CT chest to assess for interval changes in nodules   Patient does have intermittent shortness of breath however most likely related to underlying deconditioned state as there is no evidence of obstructive or restrictive lung disease function testing as well her his previous CT chest does not show any structural lung damage Patient may have intermittent reactive airways disease as she is a Radiographer, therapeutic  No inhaler therapy at this time as discussed with the patient   Patient does have a history of breast cancer status post radiation therapy with bilateral lung nodules subcentimeter and associated with chronic allergic rhinitis Obtain CT chest  Abnormal CT calcium score of the coronary arteries Follow-up cardiology    MEDICATION ADJUSTMENTS/LABS AND TESTS ORDERED:  CURRENT MEDICATIONS REVIEWED AT LENGTH WITH PATIENT TODAY COVID 19 VACCINE completed June 2021  Patient  satisfied with Plan of action and management. All questions answered  Follow up 1 year  Total time spent 22 mins  Amyre Segundo Patricia Pesa, M.D.  [facility]  Medical Director [place] Director [facility] Cardio-Pulmonary Department

## 2021-03-04 NOTE — Telephone Encounter (Signed)
Anita, please advise. Thanks 

## 2021-03-05 NOTE — Telephone Encounter (Signed)
I have spoke with Courtney Romero and her CT has been scheduled on 03/31/21 @ 8:30am at [facility]

## 2021-03-31 ENCOUNTER — Ambulatory Visit
Admission: RE | Admit: 2021-03-31 | Discharge: 2021-03-31 | Disposition: A | Discharge status: Home / Self Care | Source: Ambulatory Visit | Attending: Internal Medicine | Admitting: Internal Medicine

## 2021-03-31 ENCOUNTER — Other Ambulatory Visit: Payer: Self-pay

## 2021-03-31 DIAGNOSIS — R918 Other nonspecific abnormal finding of lung field: Secondary | ICD-10-CM | POA: Diagnosis present

## 2021-04-07 ENCOUNTER — Encounter: Payer: Self-pay | Admitting: Internal Medicine

## 2021-04-07 NOTE — Telephone Encounter (Signed)
Dr. Kasa, please advise. Thanks °

## 2021-04-08 ENCOUNTER — Ambulatory Visit
Admission: RE | Admit: 2021-04-08 | Discharge: 2021-04-08 | Disposition: A | Discharge status: Home / Self Care | Source: Ambulatory Visit | Attending: Hematology and Oncology | Admitting: Hematology and Oncology

## 2021-04-08 DIAGNOSIS — Z9889 Other specified postprocedural states: Secondary | ICD-10-CM

## 2021-05-04 NOTE — Progress Notes (Signed)
° °Patient Care Team: °Anderson, Marshall W, MD as PCP - General (Unknown Physician Specialty) °Skains, Mark C, MD as PCP - Cardiology (Cardiology) °Magrinat, Gustav C, MD as Consulting Physician (Oncology) °Cornett, Thomas, MD as Consulting Physician (General Surgery) °Dermatology, [place] °Squire, Sarah, MD as Attending Physician (Radiation Oncology) °Causey, Lindsey Cornetto, NP as Nurse Practitioner (Hematology and Oncology) °Beasley, Bethany, MD as Consulting Physician (Obstetrics and Gynecology) °Kasa, Kurian, MD as Consulting Physician (Pulmonary Disease) °Shamleffer, Ibtehal Jaralla, MD as Consulting Physician (Endocrinology) °Kasa, Kurian, MD as Consulting Physician (Pulmonary Disease) ° °DIAGNOSIS:  °  ICD-10-CM   °1. Malignant neoplasm of upper-inner quadrant of left breast in female, estrogen receptor positive (HCC)  C50.212   ° Z17.0   °  ° ° °SUMMARY OF ONCOLOGIC HISTORY: °Oncology History  °Malignant neoplasm of upper-inner quadrant of left breast in female, estrogen receptor positive (HCC)  °04/08/2017 Initial Diagnosis  ° post biopsy of 2 areas in the upper outer quadrant of the left breast 04/08/2017, both showing extensive ductal carcinoma in situ low-grade, but both showing invasive ductal carcinoma (measuring 2-3 mm on the slides), grade 1, estrogen and progesterone receptor positive, HER-2 not amplified, with an MIB-1 of 5% °  °04/27/2017 Cancer Staging  ° Staging form: Breast, AJCC 8th Edition °- Clinical: Stage IA (cT1a, cN0, cM0, G1, ER+, PR+, HER2-) - Signed by Magrinat, Gustav C, MD on 05/07/2020 ° °  °04/29/2017 -  Anti-estrogen oral therapy  ° tamoxifen started neoadjuvantly 04/29/2017  °  °06/14/2017 Surgery  ° left lumpectomy and sentinel lymph node sampling showed only residual ductal carcinoma in situ, low-grade, with negative margins. °            (a) a total of 3 axillary lymph nodes were removed °  °06/22/2017 Cancer Staging  ° Staging form: Breast, AJCC 8th Edition °- Pathologic:  Stage 0 (pTis (DCIS), pN0, cM0) - Signed by Magrinat, Gustav C, MD on 05/07/2020 ° °  °07/12/2017 Genetic Testing  ° The Common Hereditary Cancer Panel offered by [facility] includes sequencing and/or deletion duplication testing of the following 47 genes: APC, ATM, AXIN2, BARD1, BMPR1A, BRCA1, BRCA2, BRIP1, CDH1, CDKN2A (p14ARF), CDKN2A (p16INK4a), CKD4, CHEK2, CTNNA1, DICER1, EPCAM (Deletion/duplication testing only), GREM1 (promoter region deletion/duplication testing only), KIT, MEN1, MLH1, MSH2, MSH3, MSH6, MUTYH, NBN, NF1, NHTL1, PALB2, PDGFRA, PMS2, POLD1, POLE, PTEN, RAD50, RAD51C, RAD51D, SDHB, SDHC, SDHD, SMAD4, SMARCA4. STK11, TP53, TSC1, TSC2, and VHL.  The following genes were evaluated for sequence changes only: SDHA and HOXB13 c.251G>A variant only. ° °Results:  Negative, No pathogenic variants identified.  The date of this test report is 07/12/2017.  °  °07/26/2017 - 08/22/2017 Radiation Therapy  ° Site/dose:   40.05 Gy directed to the left breast in 15 fractions followed by a boost of 10 Gy in 5 fractions °  ° ° °CHIEF COMPLIANT: Follow-up of estrogen receptor positive breast cancer, establishing oncology care with me ° °INTERVAL HISTORY: Courtney Romero is a 61 y.o. with above-mentioned history of estrogen receptor positive breast cancer. Mammogram on 04/08/2021 showed no evidence of malignancy bilaterally. She presents to the clinic today for follow-up.  She is here for follow-up on tamoxifen therapy.  She is tolerating tamoxifen moderately well.  She complains of severe hot flashes as well as muscle cramps and joint stiffness.  She also feels bloating sensation but that she does not attributed to tamoxifen.  Denies any lumps or nodules in the breast. ° °ALLERGIES:  is allergic to propoxyphene, thioridazine hcl, bupropion, citalopram,   citalopram, duloxetine, and thioridazine.  MEDICATIONS:  Current Outpatient Medications  Medication Sig Dispense Refill   aspirin EC 81 MG tablet Take 1 tablet (81 mg total) by  mouth daily. 90 tablet 3   Calcium Carbonate (CALCIUM 600 PO) Take 600 mg by mouth daily.      cholecalciferol (VITAMIN D) 1000 units tablet Take 1,000 Units by mouth daily.     CRESTOR 20 MG tablet TAKE ONE TABLET BY MOUTH EVERY DAY 90 tablet 3   LORazepam (ATIVAN) 0.5 MG tablet Take 0.5 mg by mouth every 8 (eight) hours as needed for anxiety.      Melatonin 5 MG TABS Take 5 mg by mouth at bedtime.      omeprazole (PRILOSEC) 40 MG capsule Take 40 mg by mouth daily.     tamoxifen (NOLVADEX) 20 MG tablet Take 1 tablet (20 mg total) by mouth daily. 90 tablet 3   zolpidem (AMBIEN) 5 MG tablet Take 5 mg by mouth at bedtime as needed for sleep.      No current facility-administered medications for this visit.    PHYSICAL EXAMINATION: ECOG PERFORMANCE STATUS: 1 - Symptomatic but completely ambulatory  Vitals:   05/05/21 1136  BP: 118/79  Pulse: 86  Resp: 18  Temp: 97.7 F (36.5 C)  SpO2: 100%   Filed Weights   05/05/21 1136  Weight: 156 lb 1.6 oz (70.8 kg)    BREAST: No palpable masses or nodules in either right or left breasts. No palpable axillary supraclavicular or infraclavicular adenopathy no breast tenderness or nipple discharge. (exam performed in the presence of a chaperone)  LABORATORY DATA:  I have reviewed the data as listed CMP Latest Ref Rng & Units 05/05/2021 05/07/2020 08/24/2019  Glucose 70 - 99 mg/dL 114(H) 120(H) 105(H)  BUN 6 - 20 mg/dL _0 Creatinine 0.44 - 1.00 mg/dL 0.79 0.81 0.90  Sodium 135 - 145 mmol/L 142 143 143  Potassium 3.5 - 5.1 mmol/L 3.9 4.2 3.8  Chloride 98 - 111 mmol/L 108 109 111  CO2 22 - 32 mmol/L _1 Calcium 8.9 - 10.3 mg/dL 9.0 9.3 9.1  Total Protein 6.5 - 8.1 g/dL 6.7 7.5 -  Total Bilirubin 0.3 - 1.2 mg/dL 0.6 0.6 -  Alkaline Phos 38 - 126 U/L 43 54 -  AST 15 - 41 U/L 18 18 -  ALT 0 - 44 U/L 19 17 -    Lab Results  Component Value Date   WBC 3.5 (L) 05/05/2021   HGB 12.7 05/05/2021   HCT 38.4 05/05/2021   MCV 91.6  05/05/2021   PLT 171 05/05/2021   NEUTROABS 2.0 05/05/2021    ASSESSMENT & PLAN:  Malignant neoplasm of upper-inner quadrant of left breast in female, estrogen receptor positive ([place]) 04/08/2017: Left breast cancer 2 to 3 mm, grade 1, ER/PR positive HER2 negative Ki-67 5%, extensive DCIS 06/14/2017: Left lumpectomy: No invasive cancer only DCIS, 0/3 lymph nodes Patient of Dr. Jana Hakim, establishing care with me  Current treatment: Tamoxifen started 04/29/2017   Tamoxifen toxicities: 1.  Feeling of extreme heat 2. muscle cramps 3.  Stiffness in the hips  We discussed lowering the dosage of tamoxifen from 20 mg to 10 mg. She is unsure about that and may decide to continue tamoxifen at the 20 mg dose and finish the last year for treatment.  Breast cancer surveillance: 1.  Breast exam 05/05/2021: Benign 2. mammogram December 2022: Benign  Return to clinic in 1 year for  No orders of the defined types were placed in this encounter. ° °The patient has a good understanding of the overall plan. she agrees with it. she will call with any problems that may develop before the next visit here. ° °Total time spent: 30 mins including face to face time and time spent for planning, charting and coordination of care ° °Vinay K Gudena, MD, MPH °05/05/2021 ° °I, Kirstyn Evans, am acting as scribe for Dr. Vinay Gudena. ° °I have reviewed the above documentation for accuracy and completeness, and I agree with the above. ° ° ° ° ° ° °

## 2021-05-05 ENCOUNTER — Inpatient Hospital Stay: Attending: Hematology and Oncology | Admitting: Hematology and Oncology

## 2021-05-05 ENCOUNTER — Inpatient Hospital Stay (HOSPITAL_BASED_OUTPATIENT_CLINIC_OR_DEPARTMENT_OTHER)

## 2021-05-05 ENCOUNTER — Other Ambulatory Visit: Payer: Self-pay

## 2021-05-05 DIAGNOSIS — Z170 Estrogen receptor positive status [ER+]: Secondary | ICD-10-CM | POA: Diagnosis not present

## 2021-05-05 DIAGNOSIS — I700 Atherosclerosis of aorta: Secondary | ICD-10-CM

## 2021-05-05 DIAGNOSIS — Z79810 Long term (current) use of selective estrogen receptor modulators (SERMs): Secondary | ICD-10-CM | POA: Diagnosis not present

## 2021-05-05 DIAGNOSIS — B182 Chronic viral hepatitis C: Secondary | ICD-10-CM

## 2021-05-05 DIAGNOSIS — C50212 Malignant neoplasm of upper-inner quadrant of left female breast: Secondary | ICD-10-CM

## 2021-05-05 DIAGNOSIS — K6389 Other specified diseases of intestine: Secondary | ICD-10-CM

## 2021-05-05 DIAGNOSIS — K638219 Small intestinal bacterial overgrowth, unspecified: Secondary | ICD-10-CM

## 2021-05-05 LAB — COMPREHENSIVE METABOLIC PANEL
ALT: 19 U/L (ref 0–44)
AST: 18 U/L (ref 15–41)
Albumin: 3.8 g/dL (ref 3.5–5.0)
Alkaline Phosphatase: 43 U/L (ref 38–126)
Anion gap: 7 (ref 5–15)
BUN: 10 mg/dL (ref 6–20)
CO2: 27 mmol/L (ref 22–32)
Calcium: 9 mg/dL (ref 8.9–10.3)
Chloride: 108 mmol/L (ref 98–111)
Creatinine, Ser: 0.79 mg/dL (ref 0.44–1.00)
GFR, Estimated: >60 mL/min (ref >60)
Glucose, Bld: 114 mg/dL — ABNORMAL HIGH (ref 70–99)
Potassium: 3.9 mmol/L (ref 3.5–5.1)
Sodium: 142 mmol/L (ref 135–145)
Total Bilirubin: 0.6 mg/dL (ref 0.3–1.2)
Total Protein: 6.7 g/dL (ref 6.5–8.1)

## 2021-05-05 LAB — CBC WITH DIFFERENTIAL/PLATELET
Abs Immature Granulocytes: 0.01 10*3/uL (ref 0.00–0.07)
Basophils Absolute: 0 10*3/uL (ref 0.0–0.1)
Basophils Relative: 1 %
Eosinophils Absolute: 0.1 10*3/uL (ref 0.0–0.5)
Eosinophils Relative: 2 %
HCT: 38.4 % (ref 36.0–46.0)
Hemoglobin: 12.7 g/dL (ref 12.0–15.0)
Immature Granulocytes: 0 %
Lymphocytes Relative: 38 %
Lymphs Abs: 1.3 10*3/uL (ref 0.7–4.0)
MCH: 30.3 pg (ref 26.0–34.0)
MCHC: 33.1 g/dL (ref 30.0–36.0)
MCV: 91.6 fL (ref 80.0–100.0)
Monocytes Absolute: 0.2 10*3/uL (ref 0.1–1.0)
Monocytes Relative: 4 %
Neutro Abs: 2 10*3/uL (ref 1.7–7.7)
Neutrophils Relative %: 55 %
Platelets: 171 10*3/uL (ref 150–400)
RBC: 4.19 MIL/uL (ref 3.87–5.11)
RDW: 12.7 % (ref 11.5–15.5)
WBC: 3.5 10*3/uL — ABNORMAL LOW (ref 4.0–10.5)
nRBC: 0 % (ref 0.0–0.2)

## 2021-05-05 MED ORDER — TAMOXIFEN CITRATE 20 MG PO TABS
20.0000 mg | ORAL_TABLET | Freq: Every day | ORAL | 3 refills | Status: DC
Start: 2021-05-05 — End: 2021-05-08

## 2021-05-05 NOTE — Assessment & Plan Note (Signed)
04/08/2017: Left breast cancer 2 to 3 mm, grade 1, ER/PR positive HER2 negative Ki-67 5%, extensive DCIS 06/14/2017: Left lumpectomy: No invasive cancer only DCIS, 0/3 lymph nodes Patient of Dr. Jana Hakim, establishing care with me  Current treatment: Tamoxifen started 04/29/2017   Tamoxifen toxicities: 1.  Feeling of extreme heat 2. muscle cramps 3.  Stiffness in the hips  We discussed lowering the dosage of tamoxifen from 20 mg to 10 mg. She is unsure about that and may decide to continue tamoxifen at the 20 mg dose and finish the last year for treatment.  Breast cancer surveillance: 1.  Breast exam 05/05/2021: Benign 2. mammogram December 2022: Benign  Return to clinic in 1 year for follow-up

## 2021-05-08 ENCOUNTER — Other Ambulatory Visit: Payer: Self-pay | Admitting: *Deleted

## 2021-05-08 ENCOUNTER — Encounter: Payer: Self-pay | Admitting: Hematology and Oncology

## 2021-05-08 MED ORDER — TAMOXIFEN CITRATE 10 MG PO TABS: 10.0000 mg | ORAL_TABLET | Freq: Every day | ORAL | 3 refills | Status: DC

## 2021-08-12 ENCOUNTER — Encounter: Payer: Self-pay | Admitting: Hematology and Oncology

## 2021-08-13 ENCOUNTER — Telehealth: Payer: Self-pay | Admitting: Hematology and Oncology

## 2021-08-13 NOTE — Telephone Encounter (Signed)
.  Called patient to schedule appointment per 4/27 inbasket, patient is aware of date and time.   ?

## 2021-09-01 ENCOUNTER — Other Ambulatory Visit: Payer: Self-pay

## 2021-09-01 ENCOUNTER — Encounter: Payer: Self-pay | Admitting: Adult Health

## 2021-09-01 ENCOUNTER — Inpatient Hospital Stay: Attending: Adult Health | Admitting: Adult Health

## 2021-09-01 ENCOUNTER — Inpatient Hospital Stay

## 2021-09-01 VITALS — BP 133/85 | HR 76 | Temp 98.1°F | Resp 16 | Ht 67.0 in | Wt 156.7 lb

## 2021-09-01 DIAGNOSIS — Z79810 Long term (current) use of selective estrogen receptor modulators (SERMs): Secondary | ICD-10-CM | POA: Insufficient documentation

## 2021-09-01 DIAGNOSIS — E559 Vitamin D deficiency, unspecified: Secondary | ICD-10-CM

## 2021-09-01 DIAGNOSIS — M2559 Pain in other specified joint: Secondary | ICD-10-CM

## 2021-09-01 DIAGNOSIS — C50212 Malignant neoplasm of upper-inner quadrant of left female breast: Secondary | ICD-10-CM | POA: Diagnosis present

## 2021-09-01 DIAGNOSIS — N951 Menopausal and female climacteric states: Secondary | ICD-10-CM | POA: Diagnosis not present

## 2021-09-01 DIAGNOSIS — R5383 Other fatigue: Secondary | ICD-10-CM | POA: Insufficient documentation

## 2021-09-01 DIAGNOSIS — Z170 Estrogen receptor positive status [ER+]: Secondary | ICD-10-CM | POA: Insufficient documentation

## 2021-09-01 LAB — CBC WITH DIFFERENTIAL (CANCER CENTER ONLY)
Abs Immature Granulocytes: 0 10*3/uL (ref 0.00–0.07)
Basophils Absolute: 0 10*3/uL (ref 0.0–0.1)
Basophils Relative: 1 %
Eosinophils Absolute: 0.1 10*3/uL (ref 0.0–0.5)
Eosinophils Relative: 2 %
HCT: 39.1 % (ref 36.0–46.0)
Hemoglobin: 13.3 g/dL (ref 12.0–15.0)
Immature Granulocytes: 0 %
Lymphocytes Relative: 35 %
Lymphs Abs: 1.5 10*3/uL (ref 0.7–4.0)
MCH: 31.1 pg (ref 26.0–34.0)
MCHC: 34 g/dL (ref 30.0–36.0)
MCV: 91.4 fL (ref 80.0–100.0)
Monocytes Absolute: 0.2 10*3/uL (ref 0.1–1.0)
Monocytes Relative: 5 %
Neutro Abs: 2.6 10*3/uL (ref 1.7–7.7)
Neutrophils Relative %: 57 %
Platelet Count: 145 10*3/uL — ABNORMAL LOW (ref 150–400)
RBC: 4.28 MIL/uL (ref 3.87–5.11)
RDW: 13.4 % (ref 11.5–15.5)
WBC Count: 4.4 10*3/uL (ref 4.0–10.5)
nRBC: 0 % (ref 0.0–0.2)

## 2021-09-01 LAB — CMP (CANCER CENTER ONLY)
ALT: 17 U/L (ref 0–44)
AST: 18 U/L (ref 15–41)
Albumin: 4.1 g/dL (ref 3.5–5.0)
Alkaline Phosphatase: 61 U/L (ref 38–126)
Anion gap: 6 (ref 5–15)
BUN: 10 mg/dL (ref 6–20)
CO2: 27 mmol/L (ref 22–32)
Calcium: 9 mg/dL (ref 8.9–10.3)
Chloride: 110 mmol/L (ref 98–111)
Creatinine: 0.9 mg/dL (ref 0.44–1.00)
GFR, Estimated: >60 mL/min (ref >60)
Glucose, Bld: 114 mg/dL — ABNORMAL HIGH (ref 70–99)
Potassium: 3.4 mmol/L — ABNORMAL LOW (ref 3.5–5.1)
Sodium: 143 mmol/L (ref 135–145)
Total Bilirubin: 0.6 mg/dL (ref 0.3–1.2)
Total Protein: 7.1 g/dL (ref 6.5–8.1)

## 2021-09-01 LAB — VITAMIN D 25 HYDROXY (VIT D DEFICIENCY, FRACTURES): Vit D, 25-Hydroxy: 39.6 ng/mL (ref 30–100)

## 2021-09-01 LAB — C-REACTIVE PROTEIN: CRP: 0.9 mg/dL (ref <1.0)

## 2021-09-01 LAB — VITAMIN B12: Vitamin B-12: 166 pg/mL — ABNORMAL LOW (ref 180–914)

## 2021-09-01 LAB — MAGNESIUM: Magnesium: 2.1 mg/dL (ref 1.7–2.4)

## 2021-09-01 LAB — SEDIMENTATION RATE: Sed Rate: 8 mm/hr (ref 0–22)

## 2021-09-01 MED ORDER — ESCITALOPRAM OXALATE 5 MG PO TABS: 5.0000 mg | ORAL_TABLET | Freq: Every day | ORAL | 11 refills | Status: DC

## 2021-09-01 NOTE — Assessment & Plan Note (Signed)
04/08/2017: Left breast cancer 2 to 3 mm, grade 1, ER/PR positive HER2 negative Ki-67 5%, extensive DCIS ?06/14/2017: Left lumpectomy: No invasive cancer only DCIS, 0/3 lymph nodes ?Patient of Dr. Jana Hakim, establishing care with me ? ?Current treatment: Tamoxifen started 04/29/2017 ?Courtney Romero is having increasing difficulty with several symptoms including body aches, bone pain, fatigue, and hot flashes.   ?During our visit she was tearful and we are going to do the following: bone scan, lab testing for alternate etiology, and Lexapro.  She will f/u with me in 2 weeks for virtual visit to check in on how she is doing and discuss the results.   ? ?She has opted to stay off tamoxifen, and hopefully with more time many of these issues will improve.  However, considering the level of her symptoms and concerns will proceed with the above to ensure no other reversible etiology.  ?

## 2021-09-01 NOTE — Progress Notes (Signed)
[facility] up: ?  ? ?[name], MD ?[address] ?[place] [address] ? ? ?DIAGNOSIS:  Cancer Staging  ?Malignant neoplasm of upper-inner quadrant of left breast in female, estrogen receptor positive ([place]) ?Staging form: Breast, AJCC 8th Edition ?- Clinical: Stage IA (cT1a, cN0, cM0, G1, ER+, PR+, HER2-) - Signed by Chauncey Cruel, MD on 05/07/2020 ?Histologic grading system: 3 grade system ?- Pathologic: Stage 0 (pTis (DCIS), pN0, cM0) - Signed by Chauncey Cruel, MD on 05/07/2020 ? ? ?SUMMARY OF ONCOLOGIC HISTORY: ?Oncology History  ?Malignant neoplasm of upper-inner quadrant of left breast in female, estrogen receptor positive ([place])  ?04/08/2017 Initial Diagnosis  ? post biopsy of 2 areas in the upper outer quadrant of the left breast 04/08/2017, both showing extensive ductal carcinoma in situ low-grade, but both showing invasive ductal carcinoma (measuring 2-3 mm on the slides), grade 1, estrogen and progesterone receptor positive, HER-2 not amplified, with an MIB-1 of 5% ? ?  ?04/27/2017 Cancer Staging  ? Staging form: Breast, AJCC 8th Edition ?- Clinical: Stage IA (cT1a, cN0, cM0, G1, ER+, PR+, HER2-) - Signed by Chauncey Cruel, MD on 05/07/2020 ? ?  ?04/29/2017 -  Anti-estrogen oral therapy  ? tamoxifen started neoadjuvantly 04/29/2017  ? ?  ?06/14/2017 Surgery  ? left lumpectomy and sentinel lymph node sampling showed only residual ductal carcinoma in situ, low-grade, with negative margins. ?            (a) a total of 3 axillary lymph nodes were removed ? ?  ?06/22/2017 Cancer Staging  ? Staging form: Breast, AJCC 8th Edition ?- Pathologic: Stage 0 (pTis (DCIS), pN0, cM0) - Signed by Chauncey Cruel, MD on 05/07/2020 ? ?  ?07/12/2017 Genetic Testing  ? The Common Hereditary Cancer Panel offered by [facility] includes sequencing and/or deletion duplication testing of the following 47 genes: APC, ATM, AXIN2, BARD1, BMPR1A, BRCA1, BRCA2,  BRIP1, CDH1, CDKN2A (p14ARF), CDKN2A (p16INK4a), CKD4, CHEK2, CTNNA1, DICER1, EPCAM (Deletion/duplication testing only), GREM1 (promoter region deletion/duplication testing only), KIT, MEN1, MLH1, MSH2, MSH3, MSH6, MUTYH, NBN, NF1, NHTL1, PALB2, PDGFRA, PMS2, POLD1, POLE, PTEN, RAD50, RAD51C, RAD51D, SDHB, SDHC, SDHD, SMAD4, SMARCA4. STK11, TP53, TSC1, TSC2, and VHL.  The following genes were evaluated for sequence changes only: SDHA and HOXB13 c.251G>A variant only. ? ?Results:  Negative, No pathogenic variants identified.  The date of this test report is 07/12/2017.  ?  ?07/26/2017 - 08/22/2017 Radiation Therapy  ? Site/dose:   40.05 Gy directed to the left breast in 15 fractions followed by a boost of 10 Gy in 5 fractions ? ?  ? ? ?CURRENT THERAPY: Tamoxifen  ? ?INTERVAL HISTORY: ?Courtney Romero 61 y.o. female returns for evaluation accompanied by her husband.  Jiya stopped tamoxifen about 2 weeks ago.  She has gradually been feeling poorly, starting about 1 year ago.  She notes the sensation began as aching in her feet and has slowly increased in all of her other joints.  She describes herself as more emotional, depressed, experiencing increased hot flashes, and in 10/10 pain.  She called [place] a couple of weeks ago and she was instructed to stop taking tamoxifen which she did.  She tells me she has not noticed a significant issue since stopping tamoxifen.  ? ? ?Patient Active Problem List  ? Diagnosis Date Noted  ? Subclinical hyperthyroidism 02/28/2020  ? Multinodular goiter 02/28/2020  ? Genetic testing 07/28/2017  ? GERD  without esophagitis 07/05/2017  ? Family history of pancreatic cancer   ? Family history of prostate cancer   ? Family history of breast cancer   ? Aortic atherosclerosis ([place]) 04/28/2017  ? Malignant neoplasm of upper-inner quadrant of left breast in female, estrogen receptor positive ([place]) 04/27/2017  ? Hepatitis C, chronic ([place]) 09/21/2016  ? Health care maintenance 07/29/2015  ?  Menopausal disorder 01/20/2014  ? 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  ? Rectal pain 05/31/2013  ? Hemorrhoids 05/31/2013  ? Rectal fissure 11/20/2012  ? Small intestinal bacterial overgrowth 10/11/2012  ? Bloating 10/03/2012  ? Nausea 10/03/2012  ? Anal pain 05/09/2012  ? Anxiety and depression 04/04/2012  ? Diverticulosis 03/21/2012  ? SHORTNESS OF BREATH 01/08/2009  ? ? ?is allergic to propoxyphene, thioridazine hcl, bupropion, citalopram, duloxetine, and thioridazine. ? ?MEDICAL HISTORY: ?Past Medical History:  ?Diagnosis Date  ? Abnormal CAT scan 2013  ? Anxiety   ? Cancer [facility])   ? Family history of breast cancer   ? Family history of pancreatic cancer   ? Family history of prostate cancer   ? GERD (gastroesophageal reflux disease)   ? Hepatitis C 2001  ? Followed by Dr. Charlean Sanfilippo at [facility]  ? History of radiation therapy 07/26/17- 08/22/17  ? 40.05 Gy directed to the left breast in 15 fractions followed by a boost of 10 Gy in 5 fractions.   ? Personal history of radiation therapy   ? PONV (postoperative nausea and vomiting)   ? ? ?SURGICAL HISTORY: ?Past Surgical History:  ?Procedure Laterality Date  ? ABDOMINAL HYSTERECTOMY  2009  ? breast cyst removal  1995  ? BREAST LUMPECTOMY Left   ? BREAST LUMPECTOMY WITH RADIOACTIVE SEED AND SENTINEL LYMPH NODE BIOPSY Left 06/14/2017  ? Procedure: LEFT BREAST SEED LOCALIZED LUMPECTOMY (2 SEEDS) AND SENTINEL LYMPH NODE BIOPSY;  Surgeon: Erroll Luna, MD;  Location: [facility];  Service: General;  Laterality: Left;  ? COLONOSCOPY  2011  ? COLONOSCOPY WITH PROPOFOL N/A 12/12/2017  ? Procedure: COLONOSCOPY WITH PROPOFOL;  Surgeon: Jonathon Bellows, MD;  Location: [facility] ENDOSCOPY;  Service: Gastroenterology;  Laterality: N/A;  ? ESOPHAGOGASTRODUODENOSCOPY (EGD) WITH PROPOFOL N/A 12/12/2017  ? Procedure: ESOPHAGOGASTRODUODENOSCOPY (EGD) WITH PROPOFOL;  Surgeon: Jonathon Bellows, MD;  Location[facility] ENDOSCOPY;  Service: Gastroenterology;  Laterality: N/A;  ? FLEXIBLE SIGMOIDOSCOPY   February 12, 2013  ? have normal perianal exam, question focal prolapse. 2 small scars in the rectum.  ? HEMORRHOID BANDING  2012  ? 3 times over three months  ? LEFT HEART CATH AND CORONARY ANGIOGRAPHY N/A 08/27/2019  ? Procedure: LEFT HEART CATH AND CORONARY ANGIOGRAPHY;  Surgeon: Jolaine Artist, MD;  Location: [facility];  Service: Cardiovascular;  Laterality: N/A;  ? SIGMOIDOSCOPY  2013  ? TONSILECTOMY/ADENOIDECTOMY WITH MYRINGOTOMY  remote  ? UPPER GI ENDOSCOPY  2013  ? ? ?SOCIAL HISTORY: ?Social History  ? ?Socioeconomic History  ? Marital status: Married  ?  Spouse name: Not on file  ? Number of children: Not on file  ? Years of education: Not on file  ? Highest education level: Not on file  ?Occupational History  ? Occupation: Full Time  ?Tobacco Use  ? Smoking status: Former  ?  Packs/day: 1.50  ?  Years: 20.00  ?  Pack years: 30.00  ?  Types: Cigarettes  ?  Quit date: 04/19/1998  ?  Years since quitting: 23.3  ? Smokeless tobacco: Never  ? Tobacco comments:  ?  quit smoking in  2000  ?Vaping Use  ? Vaping Use: Never used  ?Substance and Sexual Activity  ? Alcohol use: No  ? Drug use: No  ? Sexual activity: Yes  ?Other Topics Concern  ? Not on file  ?Social History Narrative  ? Regular exercise: Yes  ? ?Social Determinants of Health  ? ?Financial Resource Strain: Not on file  ?Food Insecurity: Not on file  ?Transportation Needs: Not on file  ?Physical Activity: Not on file  ?Stress: Not on file  ?Social Connections: Not on file  ?Intimate Partner Violence: Not on file  ? ? ?FAMILY HISTORY: ?Family History  ?Problem Relation Age of Onset  ? Hypertension Mother   ? Thyroid disease Mother   ?     Multi problems  ? Coronary artery disease Father   ? Heart failure Father   ? Atrial fibrillation Father   ? Diabetes Brother   ? ? ?Review of Systems  ?Constitutional:  Positive for fatigue. Negative for appetite change, chills, fever and unexpected weight change.  ?HENT:   Negative for hearing loss,  lump/mass and trouble swallowing.   ?Eyes:  Negative for eye problems and icterus.  ?Respiratory:  Negative for chest tightness, cough and shortness of breath.   ?Cardiovascular:  Negative for chest pain, leg swelling an

## 2021-09-02 ENCOUNTER — Encounter: Payer: Self-pay | Admitting: Adult Health

## 2021-09-02 LAB — RHEUMATOID FACTOR: Rheumatoid fact SerPl-aCnc: 12.2 IU/mL (ref <14.0)

## 2021-09-02 LAB — ANTINUCLEAR ANTIBODIES, IFA: ANA Ab, IFA: NEGATIVE

## 2021-09-03 ENCOUNTER — Telehealth: Payer: Self-pay

## 2021-09-03 NOTE — Telephone Encounter (Signed)
Called pt to made aware of deficiency. She verbalized agreement for injections. Made pt aware scheduling will be in touch to schedule inj.

## 2021-09-03 NOTE — Telephone Encounter (Signed)
-----   Message from Gardenia Phlegm, NP sent at 09/03/2021  2:52 PM EDT ----- Labs are all back and show a b12 deficiency.  I would recommend weekly b12 injections x 4 and then monthly b12 injections  ----- Message ----- From: Interface, Lab In [place] Sent: 09/01/2021  12:10 PM EDT To: Gardenia Phlegm, NP

## 2021-09-08 ENCOUNTER — Inpatient Hospital Stay

## 2021-09-08 ENCOUNTER — Other Ambulatory Visit: Payer: Self-pay | Admitting: Adult Health

## 2021-09-08 ENCOUNTER — Other Ambulatory Visit: Payer: Self-pay

## 2021-09-08 DIAGNOSIS — E538 Deficiency of other specified B group vitamins: Secondary | ICD-10-CM | POA: Insufficient documentation

## 2021-09-08 DIAGNOSIS — C50212 Malignant neoplasm of upper-inner quadrant of left female breast: Secondary | ICD-10-CM | POA: Diagnosis not present

## 2021-09-08 MED ORDER — CYANOCOBALAMIN 1000 MCG/ML IJ SOLN
1000.0000 ug | Freq: Once | INTRAMUSCULAR | Status: AC
  Administered 2021-09-08: 1000 ug via INTRAMUSCULAR
  Filled 2021-09-08: qty 1

## 2021-09-08 NOTE — Patient Instructions (Signed)
Vitamin B12 Deficiency Vitamin B12 deficiency occurs when the body does not have enough of this important vitamin. The body needs this vitamin: To make red blood cells. To make DNA. This is the genetic material inside cells. To help the nerves work properly so they can carry messages from the brain to the body. Vitamin B12 deficiency can cause health problems, such as not having enough red blood cells in the blood (anemia). This can lead to nerve damage if untreated. What are the causes? This condition may be caused by: Not eating enough foods that contain vitamin B12. Not having enough stomach acid and digestive fluids to properly absorb vitamin B12 from the food that you eat. Having certain diseases that make it hard to absorb vitamin B12. These diseases include Crohn's disease, chronic pancreatitis, and cystic fibrosis. An autoimmune disorder in which the body does not make enough of a protein (intrinsic factor) within the stomach, resulting in not enough absorption of vitamin B12. Having a surgery in which part of the stomach or small intestine is removed. Taking certain medicines that make it hard for the body to absorb vitamin B12. These include: Heartburn medicines, such as antacids and proton pump inhibitors. Some medicines that are used to treat diabetes. What increases the risk? The following factors may make you more likely to develop a vitamin B12 deficiency: Being an older adult. Eating a vegetarian or vegan diet that does not include any foods that come from animals. Eating a poor diet while you are pregnant. Taking certain medicines. Having alcoholism. What are the signs or symptoms? In some cases, there are no symptoms of this condition. If the condition leads to anemia or nerve damage, various symptoms may occur, such as: Weakness. Tiredness (fatigue). Loss of appetite. Numbness or tingling in your hands and feet. Redness and burning of the tongue. Depression,  confusion, or memory problems. Trouble walking. If anemia is severe, symptoms can include: Shortness of breath. Dizziness. Rapid heart rate. How is this diagnosed? This condition may be diagnosed with a blood test to measure the level of vitamin B12 in your blood. You may also have other tests, including: A group of tests that measure certain characteristics of blood cells (complete blood count, CBC). A blood test to measure intrinsic factor. A procedure where a thin tube with a camera on the end is used to look into your stomach or intestines (endoscopy). Other tests may be needed to discover the cause of the deficiency. How is this treated? Treatment for this condition depends on the cause. This condition may be treated by: Changing your eating and drinking habits, such as: Eating more foods that contain vitamin B12. Drinking less alcohol or no alcohol. Getting vitamin B12 injections. Taking vitamin B12 supplements by mouth (orally). Your health care provider will tell you which dose is best for you. Follow these instructions at home: Eating and drinking  Include foods in your diet that come from animals and contain a lot of vitamin B12. These include: Meats and poultry. This includes beef, pork, chicken, turkey, and organ meats, such as liver. Seafood. This includes clams, rainbow trout, salmon, tuna, and haddock. Eggs. Dairy foods such as milk, yogurt, and cheese. Eat foods that have vitamin B12 added to them (are fortified), such as ready-to-eat breakfast cereals. Check the label on the package to see if a food is fortified. The items listed above may not be a complete list of foods and beverages you can eat and drink. Contact a dietitian for   more information. Alcohol use Do not drink alcohol if: Your health care provider tells you not to drink. You are pregnant, may be pregnant, or are planning to become pregnant. If you drink alcohol: Limit how much you have to: 0-1 drink a  day for women. 0-2 drinks a day for men. Know how much alcohol is in your drink. In the [place] one drink equals one 12 oz bottle of beer (355 mL), one 5 oz glass of wine (148 mL), or one 1 oz glass of hard liquor (44 mL). General instructions Get vitamin B12 injections if told to by your health care provider. Take supplements only as told by your health care provider. Follow the directions carefully. Keep all follow-up visits. This is important. Contact a health care provider if: Your symptoms come back. Your symptoms get worse or do not improve with treatment. Get help right away: You develop shortness of breath. You have a rapid heart rate. You have chest pain. You become dizzy or you faint. These symptoms may be an emergency. Get help right away. Call 911. Do not wait to see if the symptoms will go away. Do not drive yourself to the hospital. Summary Vitamin B12 deficiency occurs when the body does not have enough of this important vitamin. Common causes include not eating enough foods that contain vitamin B12, not being able to absorb vitamin B12 from the food that you eat, having a surgery in which part of the stomach or small intestine is removed, or taking certain medicines. Eat foods that have vitamin B12 in them. Treatment may include making a change in the way you eat and drink, getting vitamin B12 injections, or taking vitamin B12 supplements. This information is not intended to replace advice given to you by your health care provider. Make sure you discuss any questions you have with your health care provider. Document Revised: 11/28/2020 Document Reviewed: 11/28/2020 Elsevier Patient Education  2023 [employer] Inc[employer]  

## 2021-09-15 ENCOUNTER — Other Ambulatory Visit: Payer: Self-pay

## 2021-09-15 ENCOUNTER — Inpatient Hospital Stay

## 2021-09-15 ENCOUNTER — Inpatient Hospital Stay: Admitting: Adult Health

## 2021-09-15 DIAGNOSIS — E538 Deficiency of other specified B group vitamins: Secondary | ICD-10-CM

## 2021-09-15 DIAGNOSIS — C50212 Malignant neoplasm of upper-inner quadrant of left female breast: Secondary | ICD-10-CM | POA: Diagnosis not present

## 2021-09-15 MED ORDER — CYANOCOBALAMIN 1000 MCG/ML IJ SOLN
1000.0000 ug | Freq: Once | INTRAMUSCULAR | Status: AC
  Administered 2021-09-15: 1000 ug via INTRAMUSCULAR
  Filled 2021-09-15: qty 1

## 2021-09-16 ENCOUNTER — Encounter (HOSPITAL_COMMUNITY)
Admission: RE | Admit: 2021-09-16 | Discharge: 2021-09-16 | Disposition: A | Discharge status: Home / Self Care | Source: Ambulatory Visit | Attending: Adult Health | Admitting: Adult Health

## 2021-09-16 ENCOUNTER — Ambulatory Visit (HOSPITAL_COMMUNITY)
Admission: RE | Admit: 2021-09-16 | Discharge: 2021-09-16 | Disposition: A | Discharge status: Home / Self Care | Source: Ambulatory Visit | Attending: Adult Health | Admitting: Adult Health

## 2021-09-16 DIAGNOSIS — Z170 Estrogen receptor positive status [ER+]: Secondary | ICD-10-CM | POA: Diagnosis present

## 2021-09-16 DIAGNOSIS — C50212 Malignant neoplasm of upper-inner quadrant of left female breast: Secondary | ICD-10-CM | POA: Insufficient documentation

## 2021-09-16 MED ORDER — TECHNETIUM TC 99M MEDRONATE IV KIT
20.0000 | PACK | Freq: Once | INTRAVENOUS | Status: AC | PRN
  Administered 2021-09-16: 20 via INTRAVENOUS

## 2021-09-18 ENCOUNTER — Encounter: Payer: Self-pay | Admitting: Adult Health

## 2021-09-18 ENCOUNTER — Telehealth: Payer: Self-pay

## 2021-09-18 NOTE — Telephone Encounter (Signed)
Called and spoke with pt husband, informed him I am calling with good results and requested pt call back or check mychart for additional information.  Husband verbalized understanding and thanks

## 2021-09-18 NOTE — Telephone Encounter (Signed)
-----   Message from Gardenia Phlegm, NP sent at 09/18/2021  8:33 AM EDT ----- Bone scan is negative please notify patient ----- Message ----- From: Interface, Rad Results In Sent: 09/17/2021  11:44 PM EDT To: Gardenia Phlegm, NP

## 2021-09-22 ENCOUNTER — Inpatient Hospital Stay: Attending: Adult Health

## 2021-09-22 ENCOUNTER — Other Ambulatory Visit: Payer: Self-pay

## 2021-09-22 DIAGNOSIS — C50212 Malignant neoplasm of upper-inner quadrant of left female breast: Secondary | ICD-10-CM | POA: Insufficient documentation

## 2021-09-22 DIAGNOSIS — E538 Deficiency of other specified B group vitamins: Secondary | ICD-10-CM | POA: Insufficient documentation

## 2021-09-22 MED ORDER — CYANOCOBALAMIN 1000 MCG/ML IJ SOLN
1000.0000 ug | Freq: Once | INTRAMUSCULAR | Status: AC
  Administered 2021-09-22: 1000 ug via INTRAMUSCULAR
  Filled 2021-09-22: qty 1

## 2021-09-23 ENCOUNTER — Telehealth: Payer: Self-pay | Admitting: Adult Health

## 2021-09-23 ENCOUNTER — Inpatient Hospital Stay (HOSPITAL_BASED_OUTPATIENT_CLINIC_OR_DEPARTMENT_OTHER): Admitting: Adult Health

## 2021-09-23 DIAGNOSIS — C50212 Malignant neoplasm of upper-inner quadrant of left female breast: Secondary | ICD-10-CM

## 2021-09-23 DIAGNOSIS — Z170 Estrogen receptor positive status [ER+]: Secondary | ICD-10-CM

## 2021-09-23 NOTE — Telephone Encounter (Signed)
Scheduled appointment per 6/6 los. Patient is aware.

## 2021-09-28 ENCOUNTER — Encounter: Payer: Self-pay | Admitting: Adult Health

## 2021-09-28 NOTE — Assessment & Plan Note (Signed)
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  She will receive B12 injections to help supplement this low level.  This could very well be contributing to her feeling poorly.  She is also planning on taking oral vitamin D supplementation to increase her vitamin D level.  She has been feeling better off of tamoxifen and will continue off this medication.  Considering how small her breast cancer was and the fact that she has already received 4 years of antiestrogen therapy we discussed not trying another antiestrogen therapy considering the level of symptoms she is has been experiencing lately.  Courtney Romero will continue on Lexapro that I prescribed.  She is not having any worrisome side effects related to this.  We will see her back monthly for her B12 injections and she will see Dr. Lindi Adie in September 2023.

## 2021-09-28 NOTE — Progress Notes (Signed)
[facility] Follow up:    [name], MD [facility] [address] [place] [address]  I connected with Courtney Romero on 09/28/21 at 11:45 AM EDT by telephone and verified that I am speaking with the correct person using two identifiers.  I discussed the limitations, risks, security and privacy concerns of performing an evaluation and management service virtually and the availability of in person appointments.  I also discussed with the patient that there may be a patient responsible charge related to this service. The patient expressed understanding and agreed to proceed.  Patient location: home Provider location: [facility] office  DIAGNOSIS:  Cancer Staging  Malignant neoplasm of upper-inner quadrant of left breast in female, estrogen receptor positive ([place]) Staging form: Breast, AJCC 8th Edition - Clinical: Stage IA (cT1a, cN0, cM0, G1, ER+, PR+, HER2-) - Signed by Chauncey Cruel, MD on 05/07/2020 Histologic grading system: 3 grade system - Pathologic: Stage 0 (pTis (DCIS), pN0, cM0) - Signed by Chauncey Cruel, MD on 05/07/2020   SUMMARY OF ONCOLOGIC HISTORY: Oncology History  Malignant neoplasm of upper-inner quadrant of left breast in female, estrogen receptor positive ([place])  04/08/2017 Initial Diagnosis   post biopsy of 2 areas in the upper outer quadrant of the left breast 04/08/2017, both showing extensive ductal carcinoma in situ low-grade, but both showing invasive ductal carcinoma (measuring 2-3 mm on the slides), grade 1, estrogen and progesterone receptor positive, HER-2 not amplified, with an MIB-1 of 5%   04/27/2017 Cancer Staging   Staging form: Breast, AJCC 8th Edition - Clinical: Stage IA (cT1a, cN0, cM0, G1, ER+, PR+, HER2-) - Signed by Chauncey Cruel, MD on 05/07/2020   04/29/2017 -  Anti-estrogen oral therapy   tamoxifen started neoadjuvantly 04/29/2017    06/14/2017 Surgery   left lumpectomy and sentinel  lymph node sampling showed only residual ductal carcinoma in situ, low-grade, with negative margins.             (a) a total of 3 axillary lymph nodes were removed   06/22/2017 Cancer Staging   Staging form: Breast, AJCC 8th Edition - Pathologic: Stage 0 (pTis (DCIS), pN0, cM0) - Signed by Chauncey Cruel, MD on 05/07/2020   07/12/2017 Genetic Testing   The Common Hereditary Cancer Panel offered by [facility] includes sequencing and/or deletion duplication testing of the following 47 genes: APC, ATM, AXIN2, BARD1, BMPR1A, BRCA1, BRCA2, BRIP1, CDH1, CDKN2A (p14ARF), CDKN2A (p16INK4a), CKD4, CHEK2, CTNNA1, DICER1, EPCAM (Deletion/duplication testing only), GREM1 (promoter region deletion/duplication testing only), KIT, MEN1, MLH1, MSH2, MSH3, MSH6, MUTYH, NBN, NF1, NHTL1, PALB2, PDGFRA, PMS2, POLD1, POLE, PTEN, RAD50, RAD51C, RAD51D, SDHB, SDHC, SDHD, SMAD4, SMARCA4. STK11, TP53, TSC1, TSC2, and VHL.  The following genes were evaluated for sequence changes only: SDHA and HOXB13 c.251G>A variant only.  Results:  Negative, No pathogenic variants identified.  The date of this test report is 07/12/2017.    07/26/2017 - 08/22/2017 Radiation Therapy   Site/dose:   40.05 Gy directed to the left breast in 15 fractions followed by a boost of 10 Gy in 5 fractions     CURRENT THERAPY: Observation  INTERVAL HISTORY: Courtney Romero 61 y.o. female returns for follow-up of her tiredness and myalgias.  She underwent a bone scan on September 17, 2021 that showed no evidence of cancer in her bones.  She also underwent extensive lab testing that showed no autoimmune cause of her arthralgias and myalgias.  A B12 was discovered  is being decreased at 166.  Her vitamin D level was normal but on the low end of normal at 39.6.  She began receiving vitamin B12 injections on Sep 08, 2021 she has received these weekly thus far.  She is tolerating these injections well and notes a mild improvement in her tiredness at this point.  She has  stayed off tamoxifen and does not plan to restart that or any other antiestrogen therapy   Patient Active Problem List   Diagnosis Date Noted   B12 deficiency 09/08/2021   Subclinical hyperthyroidism 02/28/2020   Multinodular goiter 02/28/2020   Genetic testing 07/28/2017   GERD without esophagitis 07/05/2017   Family history of pancreatic cancer    Family history of prostate cancer    Family history of breast cancer    Aortic atherosclerosis ([place]) 04/28/2017   Malignant neoplasm of upper-inner quadrant of left breast in female, estrogen receptor positive ([place]) 04/27/2017   Hepatitis C, chronic ([place]) 09/21/2016   Health care maintenance 07/29/2015   Menopausal disorder 01/20/2014   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   Rectal pain 05/31/2013   Hemorrhoids 05/31/2013   Rectal fissure 11/20/2012   Small intestinal bacterial overgrowth 10/11/2012   Bloating 10/03/2012   Nausea 10/03/2012   Anal pain 05/09/2012   Anxiety and depression 04/04/2012   Diverticulosis 03/21/2012   SHORTNESS OF BREATH 01/08/2009    is allergic to propoxyphene, thioridazine hcl, bupropion, citalopram, duloxetine, and thioridazine.  MEDICAL HISTORY: Past Medical History:  Diagnosis Date   Abnormal CAT scan 2013   Anxiety    Cancer [facility])    Family history of breast cancer    Family history of pancreatic cancer    Family history of prostate cancer    GERD (gastroesophageal reflux disease)    Hepatitis C 2001   Followed by Dr. Charlean Sanfilippo at [facility]   History of radiation therapy 07/26/17- 08/22/17   40.05 Gy directed to the left breast in 15 fractions followed by a boost of 10 Gy in 5 fractions.    Personal history of radiation therapy    PONV (postoperative nausea and vomiting)     SURGICAL HISTORY: Past Surgical History:  Procedure Laterality Date   ABDOMINAL HYSTERECTOMY  2009   breast cyst removal  1995   BREAST LUMPECTOMY Left    BREAST LUMPECTOMY WITH RADIOACTIVE SEED AND SENTINEL LYMPH NODE BIOPSY  Left 06/14/2017   Procedure: LEFT BREAST SEED LOCALIZED LUMPECTOMY (2 SEEDS) AND SENTINEL LYMPH NODE BIOPSY;  Surgeon: Erroll Luna, MD;  Location: [facility];  Service: General;  Laterality: Left;   COLONOSCOPY  2011   COLONOSCOPY WITH PROPOFOL N/A 12/12/2017   Procedure: COLONOSCOPY WITH PROPOFOL;  Surgeon: Jonathon Bellows, MD;  Location: [facility] ENDOSCOPY;  Service: Gastroenterology;  Laterality: N/A;   ESOPHAGOGASTRODUODENOSCOPY (EGD) WITH PROPOFOL N/A 12/12/2017   Procedure: ESOPHAGOGASTRODUODENOSCOPY (EGD) WITH PROPOFOL;  Surgeon: Jonathon Bellows, MD;  Location: [facility] ENDOSCOPY;  Service: Gastroenterology;  Laterality: N/A;   FLEXIBLE SIGMOIDOSCOPY  February 12, 2013   have normal perianal exam, question focal prolapse. 2 small scars in the rectum.   HEMORRHOID BANDING  2012   3 times over three months   LEFT HEART CATH AND CORONARY ANGIOGRAPHY N/A 08/27/2019   Procedure: LEFT HEART CATH AND CORONARY ANGIOGRAPHY;  Surgeon: Jolaine Artist, MD;  Location: [facility] LAB;  Service: Cardiovascular;  Laterality: N/A;   SIGMOIDOSCOPY  2013   TONSILECTOMY/ADENOIDECTOMY WITH MYRINGOTOMY  remote   UPPER GI ENDOSCOPY  2013    SOCIAL  HISTORY: Social History   Socioeconomic History   Marital status: Married    Spouse name: Not on file   Number of children: Not on file   Years of education: Not on file   Highest education level: Not on file  Occupational History   Occupation: Full Time  Tobacco Use   Smoking status: Former    Packs/day: 1.50    Years: 20.00    Total pack years: 30.00    Types: Cigarettes    Quit date: 04/19/1998    Years since quitting: 23.4   Smokeless tobacco: Never   Tobacco comments:    quit smoking in 2000  Vaping Use   Vaping Use: Never used  Substance and Sexual Activity   Alcohol use: No   Drug use: No   Sexual activity: Yes  Other Topics Concern   Not on file  Social History Narrative   Regular exercise: Yes   Social Determinants of Health    Financial Resource Strain: Not on file  Food Insecurity: Not on file  Transportation Needs: Not on file  Physical Activity: Not on file  Stress: Not on file  Social Connections: Not on file  Intimate Partner Violence: Not on file    FAMILY HISTORY: Family History  Problem Relation Age of Onset   Hypertension Mother    Thyroid disease Mother        Multi problems   Coronary artery disease Father    Heart failure Father    Atrial fibrillation Father    Diabetes Brother     Review of Systems  Constitutional:  Positive for fatigue. Negative for appetite change, chills, fever and unexpected weight change.  HENT:   Negative for hearing loss, lump/mass and trouble swallowing.   Eyes:  Negative for eye problems and icterus.  Respiratory:  Negative for chest tightness, cough and shortness of breath.   Cardiovascular:  Negative for chest pain, leg swelling and palpitations.  Gastrointestinal:  Negative for abdominal distention, abdominal pain, constipation, diarrhea, nausea and vomiting.  Endocrine: Negative for hot flashes.  Genitourinary:  Negative for difficulty urinating.   Musculoskeletal:  Negative for arthralgias.  Skin:  Negative for itching and rash.  Neurological:  Negative for dizziness, extremity weakness, headaches and numbness.  Hematological:  Negative for adenopathy. Does not bruise/bleed easily.  Psychiatric/Behavioral:  Negative for depression. The patient is not nervous/anxious.       PHYSICAL EXAMINATION  ECOG PERFORMANCE STATUS: 1 - Symptomatic but completely ambulatory  Patient appears well she is in no apparent distress.  Skin visualized without rash or lesion.  Breathing appears nonlabored.  Mood and behavior are normal.    LABORATORY DATA:  CBC    Component Value Date/Time   WBC 4.4 09/01/2021 1149   WBC 3.5 (L) 05/05/2021 1051   RBC 4.28 09/01/2021 1149   HGB 13.3 09/01/2021 1149   HCT 39.1 09/01/2021 1149   PLT 145 (L) 09/01/2021 1149    MCV 91.4 09/01/2021 1149   MCH 31.1 09/01/2021 1149   MCHC 34.0 09/01/2021 1149   RDW 13.4 09/01/2021 1149   LYMPHSABS 1.5 09/01/2021 1149   MONOABS 0.2 09/01/2021 1149   EOSABS 0.1 09/01/2021 1149   BASOSABS 0.0 09/01/2021 1149    CMP     Component Value Date/Time   NA 143 09/01/2021 1149   K 3.4 (L) 09/01/2021 1149   CL 110 09/01/2021 1149   CO2 27 09/01/2021 1149   GLUCOSE 114 (H) 09/01/2021 1149   BUN 10 09/01/2021  1149   CREATININE 0.90 09/01/2021 1149   CALCIUM 9.0 09/01/2021 1149   PROT 7.1 09/01/2021 1149   ALBUMIN 4.1 09/01/2021 1149   AST 18 09/01/2021 1149   ALT 17 09/01/2021 1149   ALKPHOS 61 09/01/2021 1149   BILITOT 0.6 09/01/2021 1149   GFRNONAA >60 09/01/2021 [place] >60 08/24/2019 1019   GFRAA >60 04/29/2017 1348       ASSESSMENT and THERAPY PLAN:   Malignant neoplasm of upper-inner quadrant of left breast in female, estrogen receptor positive ([place]) 04/08/2017: 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  She will receive B12 injections to help supplement this low level.  This could very well be contributing to her feeling poorly.  She is also planning on taking oral vitamin D supplementation to increase her vitamin D level.  She has been feeling better off of tamoxifen and will continue off this medication.  Considering how small her breast cancer was and the fact that she has already received 4 years of antiestrogen therapy we discussed not trying another antiestrogen therapy considering the level of symptoms she is has been experiencing lately.  Jazlyne will continue on Lexapro that I prescribed.  She is not having any worrisome side effects related to this.  We will see her back monthly  for her B12 injections and she will see Dr. Lindi Adie in September 2023.   All questions were answered. The patient knows to call the clinic with any problems, questions or concerns. We can certainly see the patient much sooner if necessary.  Follow up instructions:    -Return to cancer center in 12/2021 for f/u with Dr. Lindi Adie  -Return every 4 weeks for b12 injections    The patient was provided an opportunity to ask questions and all were answered. The patient agreed with the plan and demonstrated an understanding of the instructions.   The patient was advised to call back or seek an in-person evaluation if the symptoms worsen or if the condition fails to improve as anticipated.   I provided 10 minutes of face-to-face video visit time during this encounter, and > 50% was spent counseling as documented under my assessment & plan.  Wilber Bihari, NP 09/28/21 3:38 PM Medical Oncology and Hematology [facility] [place], [place] [address] Tel. [phone]    Fax. [phone]

## 2021-09-29 ENCOUNTER — Other Ambulatory Visit: Payer: Self-pay

## 2021-09-29 ENCOUNTER — Inpatient Hospital Stay

## 2021-09-29 DIAGNOSIS — E538 Deficiency of other specified B group vitamins: Secondary | ICD-10-CM

## 2021-09-29 DIAGNOSIS — C50212 Malignant neoplasm of upper-inner quadrant of left female breast: Secondary | ICD-10-CM | POA: Diagnosis not present

## 2021-09-29 MED ORDER — CYANOCOBALAMIN 1000 MCG/ML IJ SOLN
1000.0000 ug | Freq: Once | INTRAMUSCULAR | Status: AC
  Administered 2021-09-29: 1000 ug via INTRAMUSCULAR
  Filled 2021-09-29: qty 1

## 2021-10-27 ENCOUNTER — Inpatient Hospital Stay: Attending: Adult Health

## 2021-10-27 ENCOUNTER — Other Ambulatory Visit: Payer: Self-pay

## 2021-10-27 DIAGNOSIS — E538 Deficiency of other specified B group vitamins: Secondary | ICD-10-CM | POA: Diagnosis not present

## 2021-10-27 MED ORDER — CYANOCOBALAMIN 1000 MCG/ML IJ SOLN
1000.0000 ug | Freq: Once | INTRAMUSCULAR | Status: AC
  Administered 2021-10-27: 1000 ug via INTRAMUSCULAR
  Filled 2021-10-27: qty 1

## 2021-11-01 ENCOUNTER — Ambulatory Visit
Admission: EM | Admit: 2021-11-01 | Discharge: 2021-11-01 | Disposition: A | Discharge status: Home / Self Care | Attending: Emergency Medicine | Admitting: Emergency Medicine

## 2021-11-01 DIAGNOSIS — N390 Urinary tract infection, site not specified: Secondary | ICD-10-CM | POA: Diagnosis present

## 2021-11-01 LAB — URINALYSIS, MICROSCOPIC (REFLEX)

## 2021-11-01 LAB — URINALYSIS, ROUTINE W REFLEX MICROSCOPIC
Bilirubin Urine: NEGATIVE
Glucose, UA: NEGATIVE mg/dL
Ketones, ur: NEGATIVE mg/dL
Nitrite: NEGATIVE
Protein, ur: NEGATIVE mg/dL
Specific Gravity, Urine: 1.01 (ref 1.005–1.030)
pH: 7 (ref 5.0–8.0)

## 2021-11-01 MED ORDER — NITROFURANTOIN MONOHYD MACRO 100 MG PO CAPS: 100.0000 mg | ORAL_CAPSULE | Freq: Two times a day (BID) | ORAL | 0 refills | Status: DC

## 2021-11-01 MED ORDER — PHENAZOPYRIDINE HCL 200 MG PO TABS: 200.0000 mg | ORAL_TABLET | Freq: Three times a day (TID) | ORAL | 0 refills | Status: DC

## 2021-11-01 NOTE — ED Provider Notes (Signed)
[facility]    CSN: [id number] Arrival date & time: 11/01/21  4132      History   Chief Complaint Chief Complaint  Patient presents with   Urinary Frequency    Urinary frequency, pain after urination, and abdominal discomfort. X1 day    HPI Courtney Romero is a 61 y.o. female.   HPI  61 year old female here for evaluation of urinary complaint.  Patient reports that for last days she has been experiencing urinary frequency, pain into urination, and suprapubic abdominal pain.  This is not associate with any fever, low back pain, nausea, vomiting.  Patient denies seeing any blood in her urine and states she has not paid attention as to whether or not is cloudy.  She denies any vaginal itching or discharge.  Past Medical History:  Diagnosis Date   Abnormal CAT scan 2013   Anxiety    Cancer [facility])    Family history of breast cancer    Family history of pancreatic cancer    Family history of prostate cancer    GERD (gastroesophageal reflux disease)    Hepatitis C 2001   Followed by Dr. Charlean Sanfilippo at [facility]   History of radiation therapy 07/26/17- 08/22/17   40.05 Gy directed to the left breast in 15 fractions followed by a boost of 10 Gy in 5 fractions.    Personal history of radiation therapy    PONV (postoperative nausea and vomiting)     Patient Active Problem List   Diagnosis Date Noted   B12 deficiency 09/08/2021   Subclinical hyperthyroidism 02/28/2020   Multinodular goiter 02/28/2020   Genetic testing 07/28/2017   GERD without esophagitis 07/05/2017   Family history of pancreatic cancer    Family history of prostate cancer    Family history of breast cancer    Aortic atherosclerosis ([place]) 04/28/2017   Malignant neoplasm of upper-inner quadrant of left breast in female, estrogen receptor positive ([place]) 04/27/2017   Hepatitis C, chronic ([place]) 09/21/2016   Health care maintenance 07/29/2015   Menopausal disorder 01/20/2014   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    Rectal pain 05/31/2013   Hemorrhoids 05/31/2013   Rectal fissure 11/20/2012   Small intestinal bacterial overgrowth 10/11/2012   Bloating 10/03/2012   Nausea 10/03/2012   Anal pain 05/09/2012   Anxiety and depression 04/04/2012   Diverticulosis 03/21/2012   SHORTNESS OF BREATH 01/08/2009    Past Surgical History:  Procedure Laterality Date   ABDOMINAL HYSTERECTOMY  2009   breast cyst removal  1995   BREAST LUMPECTOMY Left    BREAST LUMPECTOMY WITH RADIOACTIVE SEED AND SENTINEL LYMPH NODE BIOPSY Left 06/14/2017   Procedure: LEFT BREAST SEED LOCALIZED LUMPECTOMY (2 SEEDS) AND SENTINEL LYMPH NODE BIOPSY;  Surgeon: Erroll Luna, MD;  Location: [place];  Service: General;  Laterality: Left;   COLONOSCOPY  2011   COLONOSCOPY WITH PROPOFOL N/A 12/12/2017   Procedure: COLONOSCOPY WITH PROPOFOL;  Surgeon: Jonathon Bellows, MD;  Location: [facility] ENDOSCOPY;  Service: Gastroenterology;  Laterality: N/A;   ESOPHAGOGASTRODUODENOSCOPY (EGD) WITH PROPOFOL N/A 12/12/2017   Procedure: ESOPHAGOGASTRODUODENOSCOPY (EGD) WITH PROPOFOL;  Surgeon: Jonathon Bellows, MD;  Location: [facility] ENDOSCOPY;  Service: Gastroenterology;  Laterality: N/A;   FLEXIBLE SIGMOIDOSCOPY  February 12, 2013   have normal perianal exam, question focal prolapse. 2 small scars in the rectum.   HEMORRHOID BANDING  2012   3 times over three months   LEFT HEART CATH AND CORONARY ANGIOGRAPHY N/A 08/27/2019   Procedure: LEFT HEART CATH AND  CORONARY ANGIOGRAPHY;  Surgeon: Jolaine Artist, MD;  Location: [place] CV [facility];  Service: Cardiovascular;  Laterality: N/A;   SIGMOIDOSCOPY  2013   TONSILECTOMY/ADENOIDECTOMY WITH MYRINGOTOMY  remote   UPPER GI ENDOSCOPY  2013    OB History     Gravida  0   Para      Term      Preterm      AB      Living  0      SAB      IAB      Ectopic      Multiple      Live Births           Obstetric Comments  1st Menstrual Cycle: 14          Home Medications     Prior to Admission medications   Medication Sig Start Date End Date Taking? Authorizing Provider  aspirin EC 81 MG tablet Take 1 tablet (81 mg total) by mouth daily. 04/02/20  Yes Bensimhon, Shaune Pascal, MD  Calcium Carbonate (CALCIUM 600 PO) Take 600 mg by mouth daily.    Yes [provider]  cholecalciferol (VITAMIN D) 1000 units tablet Take 1,000 Units by mouth daily.   Yes [provider]  CRESTOR 20 MG tablet TAKE ONE TABLET BY MOUTH EVERY DAY 02/09/21  Yes Jerline Pain, MD  LORazepam (ATIVAN) 0.5 MG tablet Take 0.5 mg by mouth every 8 (eight) hours as needed for anxiety.    Yes [provider]  Melatonin 5 MG TABS Take 5 mg by mouth at bedtime.    Yes [provider]  nitrofurantoin, macrocrystal-monohydrate, (MACROBID) 100 MG capsule Take 1 capsule (100 mg total) by mouth 2 (two) times daily. 11/01/21  Yes Margarette Canada, NP  omeprazole (PRILOSEC) 40 MG capsule Take 40 mg by mouth daily.   Yes [provider]  phenazopyridine (PYRIDIUM) 200 MG tablet Take 1 tablet (200 mg total) by mouth 3 (three) times daily. 11/01/21  Yes Margarette Canada, NP  escitalopram (LEXAPRO) 5 MG tablet Take 1 tablet (5 mg total) by mouth daily. 09/01/21   Gardenia Phlegm, NP  zolpidem (AMBIEN) 5 MG tablet Take 5 mg by mouth at bedtime as needed for sleep.  02/06/15 03/03/21  [provider]    Family History Family History  Problem Relation Age of Onset   Hypertension Mother    Thyroid disease Mother        Multi problems   Coronary artery disease Father    Heart failure Father    Atrial fibrillation Father    Diabetes Brother     Social History Social History   Tobacco Use   Smoking status: Former    Packs/day: 1.50    Years: 20.00    Total pack years: 30.00    Types: Cigarettes    Quit date: 04/19/1998    Years since quitting: 23.5   Smokeless tobacco: Never   Tobacco comments:    quit smoking in 2000  Vaping Use   Vaping Use: Never  used  Substance Use Topics   Alcohol use: No   Drug use: No     Allergies   Propoxyphene, Thioridazine hcl, Bupropion, Citalopram, Duloxetine, and Thioridazine   Review of Systems Review of Systems  Constitutional:  Negative for fever.  Gastrointestinal:  Positive for abdominal pain. Negative for nausea and vomiting.  Genitourinary:  Positive for dysuria, frequency and urgency. Negative for hematuria, vaginal discharge and vaginal pain.  Musculoskeletal:  Negative for back pain.  Hematological: Negative.   Psychiatric/Behavioral: Negative.       Physical Exam Triage Vital Signs ED Triage Vitals  Enc Vitals Group     BP 11/01/21 0938 (!) 129/93     Pulse Rate 11/01/21 0938 77     Resp 11/01/21 0938 18     Temp 11/01/21 0938 98 F (36.7 C)     Temp Source 11/01/21 0938 Oral     SpO2 11/01/21 0938 100 %     Weight 11/01/21 0937 155 lb (70.3 kg)     Height 11/01/21 0937 '5\' 7"'$  (1.702 m)     Head Circumference --      Peak Flow --      Pain Score 11/01/21 0937 2     Pain Loc --      Pain Edu? --      Excl. in [place]? --    No data found.  Updated Vital Signs BP (!) 129/93 (BP Location: Right Arm)   Pulse 77   Temp 98 F (36.7 C) (Oral)   Resp 18   Ht '5\' 7"'$  (1.702 m)   Wt 155 lb (70.3 kg)   SpO2 100%   BMI 24.28 kg/m   Visual Acuity Right Eye Distance:   Left Eye Distance:   Bilateral Distance:    Right Eye Near:   Left Eye Near:    Bilateral Near:     Physical Exam Vitals and nursing note reviewed.  Constitutional:      Appearance: Normal appearance. She is not ill-appearing.  HENT:     Head: Normocephalic and atraumatic.  Cardiovascular:     Rate and Rhythm: Normal rate and regular rhythm.     Pulses: Normal pulses.     Heart sounds: Normal heart sounds. No murmur heard.    No friction rub. No gallop.  Pulmonary:     Effort: Pulmonary effort is normal.     Breath sounds: Normal breath sounds. No wheezing, rhonchi or rales.  Abdominal:      General: Abdomen is flat.     Palpations: Abdomen is soft.     Tenderness: There is abdominal tenderness. There is no right CVA tenderness, left CVA tenderness, guarding or rebound.  Skin:    General: Skin is warm and dry.     Capillary Refill: Capillary refill takes less than 2 seconds.     Findings: No erythema or rash.  Neurological:     General: No focal deficit present.     Mental Status: She is alert and oriented to person, place, and time.  Psychiatric:        Mood and Affect: Mood normal.        Behavior: Behavior normal.        Thought Content: Thought content normal.        Judgment: Judgment normal.      UC Treatments / Results  Labs (all labs ordered are listed, but only abnormal results are displayed) Labs Reviewed  URINALYSIS, ROUTINE W REFLEX MICROSCOPIC - Abnormal; Notable for the following components:      Result Value   Hgb urine dipstick TRACE (*)    Leukocytes,Ua SMALL (*)    All other components within normal limits  URINALYSIS, MICROSCOPIC (REFLEX) - Abnormal; Notable for the following components:   Bacteria, UA MANY (*)    All other components within normal limits  URINE CULTURE    EKG   Radiology No results found.  Procedures Procedures (  including critical care time)  Medications Ordered in UC Medications - No data to display  Initial Impression / Assessment and Plan / UC Course  I have reviewed the triage vital signs and the nursing notes.  Pertinent labs & imaging results that were available during my care of the patient were reviewed by me and considered in my medical decision making (see chart for details).  Patient is a pleasant, nontoxic-appearing 61 year old female here for evaluation of UTI symptoms that started last night as outlined HPI above.  On exam patient has a benign cardiopulmonary exam with S1-S2 heart sounds with regular rate and rhythm and lung sounds that are clear to auscultation all fields.  No CVA tenderness on exam.   Abdomen soft, flat, with mild suprapubic tenderness.  No guarding or rebound noted.  Patient denies any vaginal discharge so I will send urine for analysis.  If the urinalysis is negative then we will check wet prep.  Urinalysis shows trace hemoglobin and small leukocyte esterase but negative for nitrites or protein.  The reflex micro shows 21-50 WBCs with many bacteria.  0-5 squamous epithelials.  I will send urine for culture.   Final Clinical Impressions(s) / UC Diagnoses   Final diagnoses:  Lower urinary tract infectious disease     Discharge Instructions      Take the Macrobid twice daily for 5 days with food for treatment of urinary tract infection.  Use the Pyridium every 8 hours as needed for urinary discomfort.  This will turn your urine a bright red-orange.  Increase your oral fluid intake so that you increase your urine production and or flushing your urinary system.  Take an over-the-counter probiotic, such as Culturelle-Align-Activia, 1 hour after each dose of antibiotic to prevent diarrhea or yeast infections from forming.  We will culture urine and change the antibiotics if necessary.  Return for reevaluation, or see your primary care provider, for any new or worsening symptoms.      ED Prescriptions     Medication Sig Dispense Auth. Provider   nitrofurantoin, macrocrystal-monohydrate, (MACROBID) 100 MG capsule Take 1 capsule (100 mg total) by mouth 2 (two) times daily. 10 capsule Margarette Canada, NP   phenazopyridine (PYRIDIUM) 200 MG tablet Take 1 tablet (200 mg total) by mouth 3 (three) times daily. 6 tablet Margarette Canada, NP      PDMP not reviewed this encounter.   Margarette Canada, NP 11/01/21 1017

## 2021-11-01 NOTE — Discharge Instructions (Addendum)

## 2021-11-01 NOTE — ED Triage Notes (Signed)
Pt states that she has some urinary frequency, pain after urination and abdominal discomfort. X1 day

## 2021-11-02 LAB — URINE CULTURE

## 2021-11-24 ENCOUNTER — Inpatient Hospital Stay: Attending: Adult Health

## 2021-11-24 ENCOUNTER — Other Ambulatory Visit: Payer: Self-pay

## 2021-11-24 DIAGNOSIS — E538 Deficiency of other specified B group vitamins: Secondary | ICD-10-CM | POA: Diagnosis present

## 2021-11-24 MED ORDER — CYANOCOBALAMIN 1000 MCG/ML IJ SOLN
1000.0000 ug | Freq: Once | INTRAMUSCULAR | Status: AC
  Administered 2021-11-24: 1000 ug via INTRAMUSCULAR
  Filled 2021-11-24: qty 1

## 2021-11-24 NOTE — Patient Instructions (Signed)
Vitamin B12 Deficiency Vitamin B12 deficiency occurs when the body does not have enough of this important vitamin. The body needs this vitamin: To make red blood cells. To make DNA. This is the genetic material inside cells. To help the nerves work properly so they can carry messages from the brain to the body. Vitamin B12 deficiency can cause health problems, such as not having enough red blood cells in the blood (anemia). This can lead to nerve damage if untreated. What are the causes? This condition may be caused by: Not eating enough foods that contain vitamin B12. Not having enough stomach acid and digestive fluids to properly absorb vitamin B12 from the food that you eat. Having certain diseases that make it hard to absorb vitamin B12. These diseases include Crohn's disease, chronic pancreatitis, and cystic fibrosis. An autoimmune disorder in which the body does not make enough of a protein (intrinsic factor) within the stomach, resulting in not enough absorption of vitamin B12. Having a surgery in which part of the stomach or small intestine is removed. Taking certain medicines that make it hard for the body to absorb vitamin B12. These include: Heartburn medicines, such as antacids and proton pump inhibitors. Some medicines that are used to treat diabetes. What increases the risk? The following factors may make you more likely to develop a vitamin B12 deficiency: Being an older adult. Eating a vegetarian or vegan diet that does not include any foods that come from animals. Eating a poor diet while you are pregnant. Taking certain medicines. Having alcoholism. What are the signs or symptoms? In some cases, there are no symptoms of this condition. If the condition leads to anemia or nerve damage, various symptoms may occur, such as: Weakness. Tiredness (fatigue). Loss of appetite. Numbness or tingling in your hands and feet. Redness and burning of the tongue. Depression,  confusion, or memory problems. Trouble walking. If anemia is severe, symptoms can include: Shortness of breath. Dizziness. Rapid heart rate. How is this diagnosed? This condition may be diagnosed with a blood test to measure the level of vitamin B12 in your blood. You may also have other tests, including: A group of tests that measure certain characteristics of blood cells (complete blood count, CBC). A blood test to measure intrinsic factor. A procedure where a thin tube with a camera on the end is used to look into your stomach or intestines (endoscopy). Other tests may be needed to discover the cause of the deficiency. How is this treated? Treatment for this condition depends on the cause. This condition may be treated by: Changing your eating and drinking habits, such as: Eating more foods that contain vitamin B12. Drinking less alcohol or no alcohol. Getting vitamin B12 injections. Taking vitamin B12 supplements by mouth (orally). Your health care provider will tell you which dose is best for you. Follow these instructions at home: Eating and drinking  Include foods in your diet that come from animals and contain a lot of vitamin B12. These include: Meats and poultry. This includes beef, pork, chicken, turkey, and organ meats, such as liver. Seafood. This includes clams, rainbow trout, salmon, tuna, and haddock. Eggs. Dairy foods such as milk, yogurt, and cheese. Eat foods that have vitamin B12 added to them (are fortified), such as ready-to-eat breakfast cereals. Check the label on the package to see if a food is fortified. The items listed above may not be a complete list of foods and beverages you can eat and drink. Contact a dietitian for   more information. Alcohol use Do not drink alcohol if: Your health care provider tells you not to drink. You are pregnant, may be pregnant, or are planning to become pregnant. If you drink alcohol: Limit how much you have to: 0-1 drink a  day for women. 0-2 drinks a day for men. Know how much alcohol is in your drink. In the [place] one drink equals one 12 oz bottle of beer (355 mL), one 5 oz glass of wine (148 mL), or one 1 oz glass of hard liquor (44 mL). General instructions Get vitamin B12 injections if told to by your health care provider. Take supplements only as told by your health care provider. Follow the directions carefully. Keep all follow-up visits. This is important. Contact a health care provider if: Your symptoms come back. Your symptoms get worse or do not improve with treatment. Get help right away: You develop shortness of breath. You have a rapid heart rate. You have chest pain. You become dizzy or you faint. These symptoms may be an emergency. Get help right away. Call 911. Do not wait to see if the symptoms will go away. Do not drive yourself to the hospital. Summary Vitamin B12 deficiency occurs when the body does not have enough of this important vitamin. Common causes include not eating enough foods that contain vitamin B12, not being able to absorb vitamin B12 from the food that you eat, having a surgery in which part of the stomach or small intestine is removed, or taking certain medicines. Eat foods that have vitamin B12 in them. Treatment may include making a change in the way you eat and drink, getting vitamin B12 injections, or taking vitamin B12 supplements. This information is not intended to replace advice given to you by your health care provider. Make sure you discuss any questions you have with your health care provider. Document Revised: 11/28/2020 Document Reviewed: 11/28/2020 Elsevier Patient Education  2023 [employer] Inc[employer]  

## 2021-12-05 ENCOUNTER — Encounter: Payer: Self-pay | Admitting: Emergency Medicine

## 2021-12-05 ENCOUNTER — Ambulatory Visit
Admission: EM | Admit: 2021-12-05 | Discharge: 2021-12-05 | Disposition: A | Discharge status: Home / Self Care | Attending: Urgent Care | Admitting: Urgent Care

## 2021-12-05 DIAGNOSIS — N3001 Acute cystitis with hematuria: Secondary | ICD-10-CM | POA: Diagnosis present

## 2021-12-05 LAB — POCT URINALYSIS DIP (MANUAL ENTRY)
Bilirubin, UA: NEGATIVE
Glucose, UA: NEGATIVE mg/dL
Ketones, POC UA: NEGATIVE mg/dL
Nitrite, UA: NEGATIVE
Protein Ur, POC: NEGATIVE mg/dL
Spec Grav, UA: 1.01 (ref 1.010–1.025)
Urobilinogen, UA: 0.2 E.U./dL
pH, UA: 7 (ref 5.0–8.0)

## 2021-12-05 MED ORDER — CEPHALEXIN 500 MG PO CAPS: 500.0000 mg | ORAL_CAPSULE | Freq: Two times a day (BID) | ORAL | 0 refills | Status: DC

## 2021-12-05 NOTE — ED Provider Notes (Signed)
[facility]   MRN: [id number] DOB: 09/13/1960  Subjective:   Courtney Romero is a 61 y.o. female presenting for 1 day history of acute onset dysuria, urinary pressure.  Has had difficulty with UTIs.  Last 1 was in July.  She took Macrobid but urine culture was inconclusive.  Denies fever, nausea, vomiting, flank tenderness, pelvic pain.  She drinks about 4 cups of coffee daily, tries to do half decaffeinated coffee.  Otherwise she tries to drink water pretty well.  No current facility-administered medications for this encounter.  Current Outpatient Medications:    aspirin EC 81 MG tablet, Take 1 tablet (81 mg total) by mouth daily., Disp: 90 tablet, Rfl: 3   Calcium Carbonate (CALCIUM 600 PO), Take 600 mg by mouth daily. , Disp: , Rfl:    cholecalciferol (VITAMIN D) 1000 units tablet, Take 1,000 Units by mouth daily., Disp: , Rfl:    CRESTOR 20 MG tablet, TAKE ONE TABLET BY MOUTH EVERY DAY, Disp: 90 tablet, Rfl: 3   escitalopram (LEXAPRO) 5 MG tablet, Take 1 tablet (5 mg total) by mouth daily., Disp: 90 tablet, Rfl: 11   LORazepam (ATIVAN) 0.5 MG tablet, Take 0.5 mg by mouth every 8 (eight) hours as needed for anxiety. , Disp: , Rfl:    Melatonin 5 MG TABS, Take 5 mg by mouth at bedtime. , Disp: , Rfl:    nitrofurantoin, macrocrystal-monohydrate, (MACROBID) 100 MG capsule, Take 1 capsule (100 mg total) by mouth 2 (two) times daily., Disp: 10 capsule, Rfl: 0   omeprazole (PRILOSEC) 40 MG capsule, Take 40 mg by mouth daily., Disp: , Rfl:    phenazopyridine (PYRIDIUM) 200 MG tablet, Take 1 tablet (200 mg total) by mouth 3 (three) times daily., Disp: 6 tablet, Rfl: 0   zolpidem (AMBIEN) 5 MG tablet, Take 5 mg by mouth at bedtime as needed for sleep. , Disp: , Rfl:    Allergies  Allergen Reactions   Propoxyphene Rash    Uncoded Allergy. Allergen: mellaril, Other Reaction: Other reaction, double vision Uncoded Allergy. Allergen: mellaril, Other Reaction: Other  reaction, double vision Uncoded Allergy. Allergen: mellaril, Other Reaction: Other reaction, double vision   Thioridazine Hcl     REACTION: Reaction not known   Bupropion Rash and Other (See Comments)    Insomnia Insomnia   Citalopram Rash and Other (See Comments)    Lightheaded Lightheaded   Duloxetine Rash and Other (See Comments)    Insomnia Insomnia   Thioridazine Rash    REACTION: Reaction not known REACTION: Reaction not known    Past Medical History:  Diagnosis Date   Abnormal CAT scan 2013   Anxiety    Cancer [facility])    Family history of breast cancer    Family history of pancreatic cancer    Family history of prostate cancer    GERD (gastroesophageal reflux disease)    Hepatitis C 2001   Follow[facility] by [facility] Charlean [facility]   History of radiation therapy 07/26/17- 08/22/17   40.05 Gy directed to the left breast in 15 fractions followed by a boost of 10 Gy in 5 fractions.    Personal history of radiation therapy    PONV (postoperative nausea and vomiting)      Past Surgical History:  Procedure Laterality Date   ABDOMINAL HYSTERECTOMY  2009   breast cyst removal  1995   BREAST LUMPECTOMY Left    BREAST LUMPECTOMY WITH RADIOACTIVE SEED AND SENTINEL LYMPH NODE BIOPSY Left 06/14/2017  Procedure: LEFT BREAST SEED LOCALIZED LUMPECTOMY (2 SEEDS) AND SENTINEL LYMPH NODE BIOPSY;  Surgeon: Erroll Luna, MD;  Location: [place];  Service: General;  Laterality: Left;   COLONOSCOPY  2011   COLONOSCOPY WITH PROPOFOL N/A 12/12/2017   Procedure: COLONOSCOPY WITH PROPOFOL;  Surgeon: Jonathon Bellows, MD;  Location: [facility] ENDOSCOPY;  Service: Gastroenterology;  Laterality: N/A;   ESOPHAGOGASTRODUODENOSCOPY (EGD) WITH PROPOFOL N/A 12/12/2017   Procedure: ESOPHAGOGASTRODUODENOSCOPY (EGD) WITH PROPOFOL;  Surgeon: Jonathon Bellows, MD;  Location: [facility] ENDOSCOPY;  Service: Gastroenterology;  Laterality: N/A;   FLEXIBLE SIGMOIDOSCOPY  February 12, 2013   have normal perianal exam,  question focal prolapse. 2 small scars in the rectum.   HEMORRHOID BANDING  2012   3 times over three months   LEFT HEART CATH AND CORONARY ANGIOGRAPHY N/A 08/27/2019   Procedure: LEFT HEART CATH AND CORONARY ANGIOGRAPHY;  Surgeon: Jolaine Artist, MD;  Location: [place] [facility];  Service: Cardiovascular;  Laterality: N/A;   SIGMOIDOSCOPY  2013   TONSILECTOMY/ADENOIDECTOMY WITH MYRINGOTOMY  remote   UPPER GI ENDOSCOPY  2013    Family History  Problem Relation Age of Onset   Hypertension Mother    Thyroid disease Mother        Multi problems   Coronary artery disease Father    Heart failure Father    Atrial fibrillation Father    Diabetes Brother     Social History   Tobacco Use   Smoking status: Former    Packs/day: 1.50    Years: 20.00    Total pack years: 30.00    Types: Cigarettes    Quit date: 04/19/1998    Years since quitting: 23.6   Smokeless tobacco: Never   Tobacco comments:    quit smoking in 2000  Vaping Use   Vaping Use: Never used  Substance Use Topics   Alcohol use: No   Drug use: No    ROS   Objective:   Vitals: BP 133/89   Pulse 78   Temp 98.6 F (37 C)   Resp 20   SpO2 98%   Physical Exam Constitutional:      General: She is not in acute distress.    Appearance: Normal appearance. She is well-developed. She is not ill-appearing, toxic-appearing or diaphoretic.  HENT:     Head: Normocephalic and atraumatic.     Nose: Nose normal.     Mouth/Throat:     Mouth: Mucous membranes are moist.  Eyes:     General: No scleral icterus.       Right eye: No discharge.        Left eye: No discharge.     Extraocular Movements: Extraocular movements intact.     Conjunctiva/sclera: Conjunctivae normal.  Cardiovascular:     Rate and Rhythm: Normal rate.  Pulmonary:     Effort: Pulmonary effort is normal.  Abdominal:     General: Bowel sounds are normal. There is no distension.     Palpations: Abdomen is soft. There is no mass.      Tenderness: There is no abdominal tenderness. There is no right CVA tenderness, left CVA tenderness, guarding or rebound.  Skin:    General: Skin is warm and dry.  Neurological:     General: No focal deficit present.     Mental Status: She is alert and oriented to person, place, and time.  Psychiatric:        Mood and Affect: Mood normal.  Behavior: Behavior normal.        Thought Content: Thought content normal.        Judgment: Judgment normal.     Results for orders placed or performed during the hospital encounter of 12/05/21 (from the past 24 hour(s))  POCT urinalysis dipstick     Status: Abnormal   Collection Time: 12/05/21 12:27 PM  Result Value Ref Range   Color, UA yellow yellow   Clarity, UA clear clear   Glucose, UA negative negative mg/dL   Bilirubin, UA negative negative   Ketones, POC UA negative negative mg/dL   Spec Grav, UA 1.010 1.010 - 1.025   Blood, UA small (A) negative   pH, UA 7.0 5.0 - 8.0   Protein Ur, POC negative negative mg/dL   Urobilinogen, UA 0.2 0.2 or 1.0 E.U./dL   Nitrite, UA Negative Negative   Leukocytes, UA Moderate (2+) (A) Negative    Assessment and Plan :   PDMP not reviewed this encounter.  1. Acute cystitis with hematuria    Start Keflex to cover for acute cystitis, urine culture pending.  Recommended aggressive hydration, limiting urinary irritants. Counseled patient on potential for adverse effects with medications prescribed/recommended today, ER and return-to-clinic precautions discussed, patient verbalized understanding.    Jaynee Eagles, [place] 12/05/21 1237

## 2021-12-05 NOTE — Discharge Instructions (Signed)

## 2021-12-05 NOTE — Progress Notes (Unsigned)
Cardiology Office Note:    Date:  12/07/2021   ID:  Courtney Romero, DOB December 24, 1960, MRN [id number]  PCP:  [name], MD   [facility] Providers Cardiologist:  Candee Furbish, MD     Referring MD: [name], MD   Chief Complaint: follow-up hyperlipidemia, elevated coronary calcium score  History of Present Illness:    Courtney Romero is a very pleasant 60 y.o. female with a hx of nonobstructive CAD, previous tobacco abuse, hepatitis C, breast cancer s/p lumpectomy 05/2017 followed by radiation therapy, and peripheral vascular disease.  She was initially referred to cardiology for evaluation of exertional dyspnea and palpitations and seen by Dr. Fletcher Anon 05/30/2018.  Previously seen in 2010 for evaluation of exertional dyspnea.  Seen by Dr. Haroldine Laws with echocardiogram which showed normal EF and no evidence of pulmonary hypertension. NST showed no evidence of ischemia.  Stress echocardiogram at [facility] in 2013 was unremarkable. CT of chest 7/20 notable for three-vessel coronary plaque. Was seen again by Dr. Haroldine Laws on 06/28/2019.  Coronary CTA 07/2019 revealed calcium score of 566, 99th % for age/sex extensive plaque in the coronaries, mainly mild stenosis but possible moderate stenosis in the proximal LAD at the takeoff of D2.  Was sent for FFR which did not reveal significant stenosis. She underwent left heart catheterization on 08/27/2019 which revealed proximal RCA to mid RCA lesion 30% stenosed, proximal LAD to mid LAD lesion 20% stenosed, LVEF 60 to 65%.  She was last seen in our office on 11/21/2020 by Dr. Marlou Porch.  She continued to have pain almost every day. Pain can last a few minutes.  Walks 2 miles without chest pain.  Seen also by GI for epigastric discomfort.  Father had first MI at age 33, currently over 29 years old with history of CABG and pacemaker. Brother had 2 stents at age 35.  She was found to have abdominal aortic atherosclerosis as well as at the  bifurcation of the iliacs.  She was not having any claudication. Screening led to a thyroid biopsy which was benign.  Today, she is here alone for follow-up.  Reports she is feeling well, however she continues to have pain in her chest. Has seen GI, takes omeprazole, no clear indication for cause of pain. Recently stopped tamoxifen, is hoping this may resolve some of her symptoms. Walks 2 to 2 1/2 miles 5 days per week. She denies shortness of breath, lower extremity edema, fatigue, palpitations, melena, hematuria, hemoptysis, diaphoresis, weakness, presyncope, syncope, orthopnea, and PND.   Past Medical History:  Diagnosis Date   Abnormal CAT scan 2013   Anxiety    Cancer [facility])    Family history of breast cancer    Family history of pancreatic cancer    Family history of prostate cancer    GERD (gastroesophageal reflux disease)    Hepatitis C 2001   Follow[facility] by [facility] Charlean Sanfilippo at [facility]   History of radiation therapy 07/26/17- 08/22/17   40.05 Gy directed to the left breast in 15 fractions followed by a boost of 10 Gy in 5 fractions.    Personal history of radiation therapy    PONV (postoperative nausea and vomiting)     Past Surgical History:  Procedure Laterality Date   ABDOMINAL HYSTERECTOMY  2009   breast cyst removal  1995   BREAST LUMPECTOMY Left    BREAST LUMPECTOMY WITH RADIOACTIVE SEED AND SENTINEL LYMPH NODE BIOPSY Left 06/14/2017   Procedure: LEFT BREAST SEED LOCALIZED LUMPECTOMY (2  SEEDS) AND SENTINEL LYMPH NODE BIOPSY;  Surgeon: Erroll Luna, MD;  Location: [place];  Service: General;  Laterality: Left;   COLONOSCOPY  2011   COLONOSCOPY WITH PROPOFOL N/A 12/12/2017   Procedure: COLONOSCOPY WITH PROPOFOL;  Surgeon: Jonathon Bellows, MD;  Location: [facility] ENDOSCOPY;  Service: Gastroenterology;  Laterality: N/A;   ESOPHAGOGASTRODUODENOSCOPY (EGD) WITH PROPOFOL N/A 12/12/2017   Procedure: ESOPHAGOGASTRODUODENOSCOPY (EGD) WITH PROPOFOL;  Surgeon: Jonathon Bellows, MD;   Location: [facility] ENDOSCOPY;  Service: Gastroenterology;  Laterality: N/A;   FLEXIBLE SIGMOIDOSCOPY  February 12, 2013   have normal perianal exam, question focal prolapse. 2 small scars in the rectum.   HEMORRHOID BANDING  2012   3 times over three months   LEFT HEART CATH AND CORONARY ANGIOGRAPHY N/A 08/27/2019   Procedure: LEFT HEART CATH AND CORONARY ANGIOGRAPHY;  Surgeon: Jolaine Artist, MD;  Location: [place] CV LAB;  Service: Cardiovascular;  Laterality: N/A;   SIGMOIDOSCOPY  2013   TONSILECTOMY/ADENOIDECTOMY WITH MYRINGOTOMY  remote   UPPER GI ENDOSCOPY  2013    Current Medications: No outpatient medications have been marked as taking for the 12/07/21 encounter (Office Visit) with Ann Maki, Lanice Schwab, NP.     Allergies:   Propoxyphene, Thioridazine hcl, Bupropion, Citalopram, Duloxetine, and Thioridazine   Social History   Socioeconomic History   Marital status: Married    Spouse name: Not on file   Number of children: Not on file   Years of education: Not on file   Highest education level: Not on file  Occupational History   Occupation: Full Time  Tobacco Use   Smoking status: Former    Packs/day: 1.50    Years: 20.00    Total pack years: 30.00    Types: Cigarettes    Quit date: 04/19/1998    Years since quitting: 23.6   Smokeless tobacco: Never   Tobacco comments:    quit smoking in 2000  Vaping Use   Vaping Use: Never used  Substance and Sexual Activity   Alcohol use: No   Drug use: No   Sexual activity: Yes  Other Topics Concern   Not on file  Social History Narrative   Regular exercise: Yes   Social Determinants of Health   Financial Resource Strain: Not on file  Food Insecurity: Not on file  Transportation Needs: Not on file  Physical Activity: Not on file  Stress: Not on file  Social Connections: Not on file     Family History: The patient's family history includes Atrial fibrillation in her father; Coronary artery disease in her father;  Diabetes in her brother; Heart failure in her father; Hypertension in her mother; Thyroid disease in her mother.  ROS:   Please see the history of present illness.    + chest pain All other systems reviewed and are negative.  Labs/Other Studies Reviewed:    The following studies were reviewed today:  Carotid Duplex 01/22/20  Right Carotid: Velocities in the right ICA are consistent with a 1-39%  stenosis.   Left Carotid: The extracranial vessels were near-normal with only minimal  wall               thickening or plaque.   Vertebrals:  Bilateral vertebral arteries demonstrate antegrade flow.  Small              caliber right vertebral artery.  Subclavians: Normal flow hemodynamics were seen in bilateral subclavian  arteries.                 Incidental findings: Complex lesion with increase vascularity  in              the mid pole of the right thyroid, measuring 1.3 x .98 x 1.6  cm.   *See table(s) above for measurements and observations.   CCTA 07/24/19  Calcium Score: 566 Agatston units.   Coronary Arteries: Right dominant with no anomalies   LM: No plaque or stenosis.   LAD system: Ostial/proximal calcified plaque, mild (<50%) stenosis. Calcified plaque at take-off of D2, possible moderate (51-69%) stenosis.   Circumflex system: Calcified plaque mid LCx, mild (<50%) stenosis.   RCA system: Mixed plaque mid RCA, mild (<50%) stenosis. Calcified plaque distal RCA, mild (<50%) stenosis. Calcified plaque in the PLV, minimal stenosis.   IMPRESSION: 1. Coronary artery calcium score 566 Agatston units. This places the patient in the 99th percentile for age and gender, suggesting high risk for future cardiac events.   2. Extensive plaque in the coronaries, mainly mild stenosis but possible moderate stenosis in the proximal LAD at the take-off of D2. Will send for FFR.  IMPRESSION: No evidence for hemodynamically significant stenosis.   LHC  08/27/19  Prox RCA to Mid RCA lesion is 30% stenosed. Prox LAD to Mid LAD lesion is 20% stenosed.   Findings:   1, Mild non-obstructive CAD 2. LVEF 60-65%   Plan:   Medical therapy.    Recent Labs: 09/01/2021: ALT 17; BUN 10; Creatinine 0.90; Hemoglobin 13.3; Magnesium 2.1; Platelet Count 145; Potassium 3.4; Sodium 143  Recent Lipid Panel    Component Value Date/Time   CHOL 133 01/21/2020 0847   TRIG 95 01/21/2020 0847   HDL 74 01/21/2020 0847   CHOLHDL 1.8 01/21/2020 0847   LDLCALC 41 01/21/2020 0847     Risk Assessment/Calculations:      Physical Exam:    VS:  BP 112/80 (BP Location: Left Arm, Patient Position: Sitting, Cuff Size: Normal)   Pulse 70   Ht '5\' 7"'$  (1.702 m)   Wt 153 lb (69.4 kg)   SpO2 98%   BMI 23.96 kg/m     Wt Readings from Last 3 Encounters:  12/07/21 153 lb (69.4 kg)  11/01/21 155 lb (70.3 kg)  09/01/21 156 lb 11.2 oz (71.1 kg)     GEN:  Well nourished, well developed in no acute distress HEENT: Normal NECK: No JVD; No carotid bruits CARDIAC: RRR, no murmurs, rubs, gallops RESPIRATORY:  Clear to auscultation without rales, wheezing or rhonchi  ABDOMEN: Soft, non-tender, non-distended MUSCULOSKELETAL:  No edema; No deformity. 2+ pedal pulses, equal bilaterally SKIN: Warm and dry NEUROLOGIC:  Alert and oriented x 3 PSYCHIATRIC:  Normal affect   EKG:  EKG is ordered today.  The ekg ordered today demonstrates NSR at 70 bpm, no ST/T abnormality  Diagnoses:    1. Elevated coronary artery calcium score   2. Medication management   3. Hyperlipidemia LDL goal <70   4. Non-cardiac chest pain   5. Coronary artery disease involving native coronary artery of native heart without angina pectoris   6. PVD (peripheral vascular disease) ([place])   7. Thyroid nodule    Assessment and Plan:    Non-obstructive CAD/Elevated coronary calcium score: Coronary calcium score 566 on CCTA 07/2019. Cardiac cath 08/2019 with prox RCA to mid RCA lesion 30%  stenosis, prox LAD to mid LAD lesion 20% stenosed. No symptoms of angina. No indication  for further ischemic evaluation at this time. Continue aspirin, Crestor.  Non-cardiac chest pain: Continues to have chest pain on a daily basis. As noted above, nonobstructive CAD on cardiac cath 07/2019.  She exercises on a consistent basis without worsening chest pain. No dyspnea. No change in symptoms on PPI.  PVD: Calcified plaque noted in abdominal aorta at bifurcation of iliacs as well. She denies claudication. Continues to walk routinely for exercise.   Hyperlipidemia LDL goal < 70: LDL 41 on 01/21/20. Not checked by PCP last visit per patient. We will check today. Continue Crestor.   Thyroid nodules: Incidentally found on carotid ultrasound 01/2020, benign. Referred to endocrinology and has follow-up scheduled for December.   Disposition: 1 year with Dr. Marlou Porch  Medication Adjustments/Labs and Tests Ordered: Current medicines are reviewed at length with the patient today.  Concerns regarding medicines are outlined above.  Orders Placed This Encounter  Procedures   Lipid panel   EKG 12-Lead   No orders of the defined types were placed in this encounter.   Patient Instructions  Medication Instructions:  Your physician recommends that you continue on your current medications as directed. Please refer to the Current Medication list given to you today.  *If you need a refill on your cardiac medications before your next appointment, please call your pharmacy*   Lab Work: Lipid- today   If you have labs (blood work) drawn today and your tests are completely normal, you will receive your results only by: [place] (if you have MyChart) OR A paper copy in the mail If you have any lab test that is abnormal or we need to change your treatment, we will call you to review the results.   Testing/Procedures: None ordered    Follow-Up: At [facility], you and your health needs are our  priority.  As part of our continuing mission to provide you with exceptional heart care, we have created designated Provider Care Teams.  These Care Teams include your primary Cardiologist (physician) and Advanced Practice Providers (APPs -  Physician Assistants and Nurse Practitioners) who all work together to provide you with the care you need, when you need it.  We recommend signing up for the patient portal called "MyChart".  Sign up information is provided on this After Visit Summary.  MyChart is used to connect with patients for Virtual Visits (Telemedicine).  Patients are able to view lab/test results, encounter notes, upcoming appointments, etc.  Non-urgent messages can be sent to your provider as well.   To learn more about what you can do with MyChart, go to [email or web address].    Your next appointment:   12 month(s)  The format for your next appointment:   In Person  Provider:   Candee Furbish, MD     Other Instructions   Important Information About Sugar         Signed, Emmaline Life, NP  12/07/2021 4:31 PM    [place]

## 2021-12-05 NOTE — ED Triage Notes (Signed)
Pt here with painful urination since this morning.

## 2021-12-06 LAB — URINE CULTURE: Culture: <10000 — AB

## 2021-12-07 ENCOUNTER — Encounter: Payer: Self-pay | Admitting: Nurse Practitioner

## 2021-12-07 ENCOUNTER — Ambulatory Visit (INDEPENDENT_AMBULATORY_CARE_PROVIDER_SITE_OTHER): Admitting: Nurse Practitioner

## 2021-12-07 VITALS — BP 112/80 | HR 70 | Ht 67.0 in | Wt 153.0 lb

## 2021-12-07 DIAGNOSIS — I739 Peripheral vascular disease, unspecified: Secondary | ICD-10-CM

## 2021-12-07 DIAGNOSIS — I2510 Atherosclerotic heart disease of native coronary artery without angina pectoris: Secondary | ICD-10-CM

## 2021-12-07 DIAGNOSIS — E785 Hyperlipidemia, unspecified: Secondary | ICD-10-CM | POA: Diagnosis not present

## 2021-12-07 DIAGNOSIS — E041 Nontoxic single thyroid nodule: Secondary | ICD-10-CM

## 2021-12-07 DIAGNOSIS — Z79899 Other long term (current) drug therapy: Secondary | ICD-10-CM

## 2021-12-07 DIAGNOSIS — R931 Abnormal findings on diagnostic imaging of heart and coronary circulation: Secondary | ICD-10-CM

## 2021-12-07 DIAGNOSIS — R0789 Other chest pain: Secondary | ICD-10-CM | POA: Diagnosis not present

## 2021-12-07 NOTE — Patient Instructions (Addendum)
Medication Instructions:  Your physician recommends that you continue on your current medications as directed. Please refer to the Current Medication list given to you today.  *If you need a refill on your cardiac medications before your next appointment, please call your pharmacy*   Lab Work: Lipid- today   If you have labs (blood work) drawn today and your tests are completely normal, you will receive your results only by: [place] (if you have MyChart) OR A paper copy in the mail If you have any lab test that is abnormal or we need to change your treatment, we will call you to review the results.   Testing/Procedures: None ordered    Follow-Up: At [facility], you and your health needs are our priority.  As part of our continuing mission to provide you with exceptional heart care, we have created designated Provider Care Teams.  These Care Teams include your primary Cardiologist (physician) and Advanced Practice Providers (APPs -  Physician Assistants and Nurse Practitioners) who all work together to provide you with the care you need, when you need it.  We recommend signing up for the patient portal called "MyChart".  Sign up information is provided on this After Visit Summary.  MyChart is used to connect with patients for Virtual Visits (Telemedicine).  Patients are able to view lab/test results, encounter notes, upcoming appointments, etc.  Non-urgent messages can be sent to your provider as well.   To learn more about what you can do with MyChart, go to [email or web address].    Your next appointment:   12 month(s)  The format for your next appointment:   In Person  Provider:   Candee Furbish, MD     Other Instructions   Important Information About Sugar

## 2021-12-08 LAB — LIPID PANEL
Chol/HDL Ratio: 2.1 ratio (ref 0.0–4.4)
Cholesterol, Total: 139 mg/dL (ref 100–199)
HDL: 65 mg/dL (ref >39)
LDL Chol Calc (NIH): 49 mg/dL (ref 0–99)
Triglycerides: 153 mg/dL — ABNORMAL HIGH (ref 0–149)
VLDL Cholesterol Cal: 25 mg/dL (ref 5–40)

## 2021-12-18 ENCOUNTER — Other Ambulatory Visit: Payer: Self-pay

## 2021-12-18 DIAGNOSIS — Z170 Estrogen receptor positive status [ER+]: Secondary | ICD-10-CM

## 2021-12-18 NOTE — Progress Notes (Signed)
Patient Care Team: [name], MD as PCP - General (Internal Medicine) Jerline Pain, MD as PCP - Cardiology (Cardiology) Erroll Luna, MD as Consulting Physician (General Surgery) Dermatology, [place] Eppie Gibson, MD as Attending Physician (Radiation Oncology) Delice Bison Charlestine Massed, NP as Nurse Practitioner (Hematology and Oncology) Benjaman Kindler, MD as Consulting Physician (Obstetrics and Gynecology) [name], MD as Consulting Physician (Pulmonary Disease) Shamleffer, Melanie Crazier, MD as Consulting Physician (Endocrinology)  DIAGNOSIS: No diagnosis found.  SUMMARY OF ONCOLOGIC HISTORY: Oncology History  Malignant neoplasm of upper-inner quadrant of left breast in female, estrogen receptor positive ([place])  04/08/2017 Initial Diagnosis   post biopsy of 2 areas in the upper outer quadrant of the left breast 04/08/2017, both showing extensive ductal carcinoma in situ low-grade, but both showing invasive ductal carcinoma (measuring 2-3 mm on the slides), grade 1, estrogen and progesterone receptor positive, HER-2 not amplified, with an MIB-1 of 5%   04/27/2017 Cancer Staging   Staging form: Breast, AJCC 8th Edition - Clinical: Stage IA (cT1a, cN0, cM0, G1, ER+, PR+, HER2-) - Signed by Chauncey Cruel, MD on 05/07/2020   04/29/2017 -  Anti-estrogen oral therapy   tamoxifen started neoadjuvantly 04/29/2017    06/14/2017 Surgery   left lumpectomy and sentinel lymph node sampling showed only residual ductal carcinoma in situ, low-grade, with negative margins.             (a) a total of 3 axillary lymph nodes were removed   06/22/2017 Cancer Staging   Staging form: Breast, AJCC 8th Edition - Pathologic: Stage 0 (pTis (DCIS), pN0, cM0) - Signed by Chauncey Cruel, MD on 05/07/2020   07/12/2017 Genetic Testing   The Common Hereditary Cancer Panel offered by [facility] includes sequencing and/or deletion duplication testing of the following 47 genes: APC, ATM,  AXIN2, BARD1, BMPR1A, BRCA1, BRCA2, BRIP1, CDH1, CDKN2A (p14ARF), CDKN2A (p16INK4a), CKD4, CHEK2, CTNNA1, DICER1, EPCAM (Deletion/duplication testing only), GREM1 (promoter region deletion/duplication testing only), KIT, MEN1, MLH1, MSH2, MSH3, MSH6, MUTYH, NBN, NF1, NHTL1, PALB2, PDGFRA, PMS2, POLD1, POLE, PTEN, RAD50, RAD51C, RAD51D, SDHB, SDHC, SDHD, SMAD4, SMARCA4. STK11, TP53, TSC1, TSC2, and VHL.  The following genes were evaluated for sequence changes only: SDHA and HOXB13 c.251G>A variant only.  Results:  Negative, No pathogenic variants identified.  The date of this test report is 07/12/2017.    07/26/2017 - 08/22/2017 Radiation Therapy   Site/dose:   40.05 Gy directed to the left breast in 15 fractions followed by a boost of 10 Gy in 5 fractions     CHIEF COMPLIANT:   INTERVAL HISTORY: Courtney Romero is a   ALLERGIES:  is allergic to propoxyphene, thioridazine hcl, bupropion, citalopram, duloxetine, and thioridazine.  MEDICATIONS:  Current Outpatient Medications  Medication Sig Dispense Refill   aspirin EC 81 MG tablet Take 1 tablet (81 mg total) by mouth daily. 90 tablet 3   Calcium Carbonate (CALCIUM 600 PO) Take 600 mg by mouth daily.      cephALEXin (KEFLEX) 500 MG capsule Take 1 capsule (500 mg total) by mouth 2 (two) times daily. 10 capsule 0   cholecalciferol (VITAMIN D) 1000 units tablet Take 1,000 Units by mouth daily.     CRESTOR 20 MG tablet TAKE ONE TABLET BY MOUTH EVERY DAY 90 tablet 3   LORazepam (ATIVAN) 0.5 MG tablet Take 0.5 mg by mouth every 8 (eight) hours as needed for anxiety.      Melatonin 5 MG TABS Take 5 mg by mouth at bedtime.  omeprazole (PRILOSEC) 40 MG capsule Take 40 mg by mouth daily.     zolpidem (AMBIEN) 5 MG tablet Take 5 mg by mouth at bedtime as needed for sleep.      No current facility-administered medications for this visit.    PHYSICAL EXAMINATION: ECOG PERFORMANCE STATUS: {CHL ONC ECOG PS:[id number]}  There were no vitals filed  for this visit. There were no vitals filed for this visit.  BREAST:*** No palpable masses or nodules in either right or left breasts. No palpable axillary supraclavicular or infraclavicular adenopathy no breast tenderness or nipple discharge. (exam performed in the presence of a chaperone)  LABORATORY DATA:  I have reviewed the data as listed    Latest Ref Rng & Units 09/01/2021   11:49 AM 05/05/2021   10:51 AM 05/07/2020   10:35 AM  CMP  Glucose 70 - 99 mg/dL 114  114  120   BUN 6 - 20 mg/dL _0 Creatinine 0.44 - 1.00 mg/dL 0.90  0.79  0.81   Sodium 135 - 145 mmol/L 143  142  143   Potassium 3.5 - 5.1 mmol/L 3.4  3.9  4.2   Chloride 98 - 111 mmol/L 110  108  109   CO2 22 - 32 mmol/L _1 Calcium 8.9 - 10.3 mg/dL 9.0  9.0  9.3   Total Protein 6.5 - 8.1 g/dL 7.1  6.7  7.5   Total Bilirubin 0.3 - 1.2 mg/dL 0.6  0.6  0.6   Alkaline Phos 38 - 126 U/L 61  43  54   AST 15 - 41 U/L _2 ALT 0 - 44 U/L _3 Lab Results  Component Value Date   WBC 4.4 09/01/2021   HGB 13.3 09/01/2021   HCT 39.1 09/01/2021   MCV 91.4 09/01/2021   PLT 145 (L) 09/01/2021   NEUTROABS 2.6 09/01/2021    ASSESSMENT & PLAN:  No problem-specific Assessment & Plan notes found for this encounter.    No orders of the defined types were placed in this encounter.  The patient has a good understanding of the overall plan. she agrees with it. she will call with any problems that may develop before the next visit here. Total time spent: 30 mins including face to face time and time spent for planning, charting and co-ordination of care   Suzzette Righter, [place] 12/18/21    I Gardiner Coins am scribing for Dr. Lindi Adie  ***

## 2021-12-22 ENCOUNTER — Inpatient Hospital Stay

## 2021-12-22 ENCOUNTER — Other Ambulatory Visit: Payer: Self-pay

## 2021-12-22 ENCOUNTER — Inpatient Hospital Stay: Attending: Adult Health

## 2021-12-22 ENCOUNTER — Other Ambulatory Visit: Payer: Self-pay | Admitting: *Deleted

## 2021-12-22 ENCOUNTER — Inpatient Hospital Stay (HOSPITAL_BASED_OUTPATIENT_CLINIC_OR_DEPARTMENT_OTHER): Admitting: Hematology and Oncology

## 2021-12-22 DIAGNOSIS — C50212 Malignant neoplasm of upper-inner quadrant of left female breast: Secondary | ICD-10-CM | POA: Diagnosis not present

## 2021-12-22 DIAGNOSIS — Z170 Estrogen receptor positive status [ER+]: Secondary | ICD-10-CM

## 2021-12-22 DIAGNOSIS — E538 Deficiency of other specified B group vitamins: Secondary | ICD-10-CM

## 2021-12-22 DIAGNOSIS — Z853 Personal history of malignant neoplasm of breast: Secondary | ICD-10-CM | POA: Insufficient documentation

## 2021-12-22 DIAGNOSIS — Z08 Encounter for follow-up examination after completed treatment for malignant neoplasm: Secondary | ICD-10-CM | POA: Insufficient documentation

## 2021-12-22 LAB — VITAMIN B12: Vitamin B-12: 360 pg/mL (ref 180–914)

## 2021-12-22 LAB — CBC WITH DIFFERENTIAL (CANCER CENTER ONLY)
Abs Immature Granulocytes: 0 10*3/uL (ref 0.00–0.07)
Basophils Absolute: 0 10*3/uL (ref 0.0–0.1)
Basophils Relative: 1 %
Eosinophils Absolute: 0.1 10*3/uL (ref 0.0–0.5)
Eosinophils Relative: 3 %
HCT: 41 % (ref 36.0–46.0)
Hemoglobin: 14.2 g/dL (ref 12.0–15.0)
Immature Granulocytes: 0 %
Lymphocytes Relative: 36 %
Lymphs Abs: 1.4 10*3/uL (ref 0.7–4.0)
MCH: 31.3 pg (ref 26.0–34.0)
MCHC: 34.6 g/dL (ref 30.0–36.0)
MCV: 90.5 fL (ref 80.0–100.0)
Monocytes Absolute: 0.2 10*3/uL (ref 0.1–1.0)
Monocytes Relative: 5 %
Neutro Abs: 2.2 10*3/uL (ref 1.7–7.7)
Neutrophils Relative %: 55 %
Platelet Count: 158 10*3/uL (ref 150–400)
RBC: 4.53 MIL/uL (ref 3.87–5.11)
RDW: 13 % (ref 11.5–15.5)
WBC Count: 3.9 10*3/uL — ABNORMAL LOW (ref 4.0–10.5)
nRBC: 0 % (ref 0.0–0.2)

## 2021-12-22 LAB — CMP (CANCER CENTER ONLY)
ALT: 16 U/L (ref 0–44)
AST: 18 U/L (ref 15–41)
Albumin: 4.7 g/dL (ref 3.5–5.0)
Alkaline Phosphatase: 72 U/L (ref 38–126)
Anion gap: 7 (ref 5–15)
BUN: 13 mg/dL (ref 6–20)
CO2: 29 mmol/L (ref 22–32)
Calcium: 9.9 mg/dL (ref 8.9–10.3)
Chloride: 108 mmol/L (ref 98–111)
Creatinine: 0.79 mg/dL (ref 0.44–1.00)
GFR, Estimated: >60 mL/min (ref >60)
Glucose, Bld: 135 mg/dL — ABNORMAL HIGH (ref 70–99)
Potassium: 3.8 mmol/L (ref 3.5–5.1)
Sodium: 144 mmol/L (ref 135–145)
Total Bilirubin: 0.7 mg/dL (ref 0.3–1.2)
Total Protein: 7.7 g/dL (ref 6.5–8.1)

## 2021-12-22 MED ORDER — CYANOCOBALAMIN 1000 MCG/ML IJ SOLN
1000.0000 ug | Freq: Once | INTRAMUSCULAR | Status: AC
  Administered 2021-12-22: 1000 ug via INTRAMUSCULAR
  Filled 2021-12-22: qty 1

## 2021-12-22 MED ORDER — CYANOCOBALAMIN 1000 MCG/ML IJ SOLN: 1000.0000 ug | INTRAMUSCULAR | 3 refills | Status: DC

## 2021-12-22 NOTE — Assessment & Plan Note (Signed)
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  I sent a prescription for a multidose vial of B12.  If she gets it from the [facility] she can do the B12 injections at home.  Courtney Romero will continue on Lexapro that I prescribed.   We will see her back monthly for her B12 injections and she will see me in 1 year.

## 2021-12-30 ENCOUNTER — Ambulatory Visit
Admission: EM | Admit: 2021-12-30 | Discharge: 2021-12-30 | Disposition: A | Discharge status: Home / Self Care | Attending: Emergency Medicine | Admitting: Emergency Medicine

## 2021-12-30 DIAGNOSIS — B3731 Acute candidiasis of vulva and vagina: Secondary | ICD-10-CM | POA: Diagnosis not present

## 2021-12-30 DIAGNOSIS — S39012A Strain of muscle, fascia and tendon of lower back, initial encounter: Secondary | ICD-10-CM | POA: Diagnosis not present

## 2021-12-30 DIAGNOSIS — N390 Urinary tract infection, site not specified: Secondary | ICD-10-CM | POA: Diagnosis present

## 2021-12-30 LAB — URINALYSIS, ROUTINE W REFLEX MICROSCOPIC
Bilirubin Urine: NEGATIVE
Glucose, UA: NEGATIVE mg/dL
Ketones, ur: NEGATIVE mg/dL
Nitrite: NEGATIVE
Protein, ur: NEGATIVE mg/dL
Specific Gravity, Urine: 1.015 (ref 1.005–1.030)
pH: 7 (ref 5.0–8.0)

## 2021-12-30 LAB — URINALYSIS, MICROSCOPIC (REFLEX)

## 2021-12-30 MED ORDER — PHENAZOPYRIDINE HCL 200 MG PO TABS: 200.0000 mg | ORAL_TABLET | Freq: Three times a day (TID) | ORAL | 0 refills | Status: DC

## 2021-12-30 MED ORDER — NITROFURANTOIN MONOHYD MACRO 100 MG PO CAPS: 100.0000 mg | ORAL_CAPSULE | Freq: Two times a day (BID) | ORAL | 0 refills | Status: DC

## 2021-12-30 MED ORDER — BACLOFEN 10 MG PO TABS: 10.0000 mg | ORAL_TABLET | Freq: Three times a day (TID) | ORAL | 0 refills | Status: DC

## 2021-12-30 MED ORDER — FLUCONAZOLE 150 MG PO TABS
150.0000 mg | ORAL_TABLET | Freq: Every day | ORAL | 0 refills | Status: AC
Start: 2021-12-30 — End: 2022-01-01

## 2021-12-30 NOTE — ED Triage Notes (Signed)
Patient presents to [facility] for painful urination and frequent urination -- started today

## 2021-12-30 NOTE — ED Provider Notes (Signed)
[facility]    CSN: [id number] Arrival date & time: 12/30/21  1514      History   Chief Complaint Chief Complaint  Patient presents with   Urinary Frequency   Dysuria    HPI Courtney Romero is a 61 y.o. female.   HPI  61 year old female here for evaluation of urinary complaints.  Patient reports that she has been experiencing urgency and frequency of urination with burning at the end of urination that started today.  She has been experiencing some low back pain but that predates her urinary symptoms.  She states that she was climbing out of a sand trap this past weekend and tweaked her back.  She states that it hurts when she bends over to tie her shoes, rolls over in bed, or when she first transitions from sitting to standing for ambulation.  She has not had any blood in her urine and she denies any fever.  She also denies any abdominal pain, vaginal discharge, or vaginal itching.  She has no numbness or tingling in her lower extremities, no weakness, and no loss of bowel or bladder control.  Past Medical History:  Diagnosis Date   Abnormal CAT scan 2013   Anxiety    Cancer [facility])    Family history of breast cancer    Family history of pancreatic cancer    Family history of prostate cancer    GERD (gastroesophageal reflux disease)    Hepatitis C 2001   Followed by Dr. Charlean Sanfilippo at [facility]   History of radiation therapy 07/26/17- 08/22/17   40.05 Gy directed to the left breast in 15 fractions followed by a boost of 10 Gy in 5 fractions.    Personal history of radiation therapy    PONV (postoperative nausea and vomiting)     Patient Active Problem List   Diagnosis Date Noted   B12 deficiency 09/08/2021   Subclinical hyperthyroidism 02/28/2020   Multinodular goiter 02/28/2020   Genetic testing 07/28/2017   GERD without esophagitis 07/05/2017   Family history of pancreatic cancer    Family history of prostate cancer    Family history of breast cancer     Aortic atherosclerosis ([place]) 04/28/2017   Malignant neoplasm of upper-inner quadrant of left breast in female, estrogen receptor positive ([place]) 04/27/2017   Hepatitis C, chronic ([place]) 09/21/2016   Health care maintenance 07/29/2015   Menopausal disorder 01/20/2014   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   Rectal pain 05/31/2013   Hemorrhoids 05/31/2013   Rectal fissure 11/20/2012   Small intestinal bacterial overgrowth 10/11/2012   Bloating 10/03/2012   Nausea 10/03/2012   Anal pain 05/09/2012   Anxiety and depression 04/04/2012   Diverticulosis 03/21/2012   SHORTNESS OF BREATH 01/08/2009    Past Surgical History:  Procedure Laterality Date   ABDOMINAL HYSTERECTOMY  2009   breast cyst removal  1995   BREAST LUMPECTOMY Left    BREAST LUMPECTOMY WITH RADIOACTIVE SEED AND SENTINEL LYMPH NODE BIOPSY Left 06/14/2017   Procedure: LEFT BREAST SEED LOCALIZED LUMPECTOMY (2 SEEDS) AND SENTINEL LYMPH NODE BIOPSY;  Surgeon: Erroll Luna, MD;  Location: [facility];  Service: General;  Laterality: Left;   COLONOSCOPY  2011   COLONOSCOPY WITH PROPOFOL N/A 12/12/2017   Procedure: COLONOSCOPY WITH PROPOFOL;  Surgeon: Jonathon Bellows, MD;  Location: [facility] ENDOSCOPY;  Service: Gastroenterology;  Laterality: N/A;   ESOPHAGOGASTRODUODENOSCOPY (EGD) WITH PROPOFOL N/A 12/12/2017   Procedure: ESOPHAGOGASTRODUODENOSCOPY (EGD) WITH PROPOFOL;  Surgeon: Jonathon Bellows, MD;  Location: [facility]  ENDOSCOPY;  Service: Gastroenterology;  Laterality: N/A;   FLEXIBLE SIGMOIDOSCOPY  February 12, 2013   have normal perianal exam, question focal prolapse. 2 small scars in the rectum.   HEMORRHOID BANDING  2012   3 times over three months   LEFT HEART CATH AND CORONARY ANGIOGRAPHY N/A 08/27/2019   Procedure: LEFT HEART CATH AND CORONARY ANGIOGRAPHY;  Surgeon: Jolaine Artist, MD;  Location: [place] [facility];  Service: Cardiovascular;  Laterality: N/A;   SIGMOIDOSCOPY  2013   TONSILECTOMY/ADENOIDECTOMY WITH MYRINGOTOMY   remote   UPPER GI ENDOSCOPY  2013    OB History     Gravida  0   Para      Term      Preterm      AB      Living  0      SAB      IAB      Ectopic      Multiple      Live Births           Obstetric Comments  1st Menstrual Cycle: 14          Home Medications    Prior to Admission medications   Medication Sig Start Date End Date Taking? Authorizing Provider  baclofen (LIORESAL) 10 MG tablet Take 1 tablet (10 mg total) by mouth 3 (three) times daily. 12/30/21  Yes Margarette Canada, NP  fluconazole (DIFLUCAN) 150 MG tablet Take 1 tablet (150 mg total) by mouth daily for 2 days. Take 1 tablet now and repeat when you have finished your antibiotic therapy. 12/30/21 01/01/22 Yes Margarette Canada, NP  nitrofurantoin, macrocrystal-monohydrate, (MACROBID) 100 MG capsule Take 1 capsule (100 mg total) by mouth 2 (two) times daily. 12/30/21  Yes Margarette Canada, NP  phenazopyridine (PYRIDIUM) 200 MG tablet Take 1 tablet (200 mg total) by mouth 3 (three) times daily. 12/30/21  Yes Margarette Canada, NP  aspirin EC 81 MG tablet Take 1 tablet (81 mg total) by mouth daily. 04/02/20   Bensimhon, Shaune Pascal, MD  Calcium Carbonate (CALCIUM 600 PO) Take 600 mg by mouth daily.     [provider]  cholecalciferol (VITAMIN D) 1000 units tablet Take 1,000 Units by mouth daily.    [provider]  CRESTOR 20 MG tablet TAKE ONE TABLET BY MOUTH EVERY DAY 02/09/21   Jerline Pain, MD  cyanocobalamin (VITAMIN B12) 1000 MCG/ML injection Inject 1 mL (1,000 mcg total) into the muscle every 30 (thirty) days. 12/22/21   Nicholas Lose, MD  LORazepam (ATIVAN) 0.5 MG tablet Take 0.5 mg by mouth every 8 (eight) hours as needed for anxiety.     [provider]  Melatonin 5 MG TABS Take 5 mg by mouth at bedtime.     [provider]  omeprazole (PRILOSEC) 40 MG capsule Take 40 mg by mouth daily.    [provider]  zolpidem (AMBIEN) 5 MG tablet Take 5 mg by mouth at bedtime as  needed for sleep.  02/06/15 03/03/21  [provider]    Family History Family History  Problem Relation Age of Onset   Hypertension Mother    Thyroid disease Mother        Multi problems   Coronary artery disease Father    Heart failure Father    Atrial fibrillation Father    Diabetes Brother     Social History Social History   Tobacco Use   Smoking status: Former    Packs/day: 1.50  Years: 20.00    Total pack years: 30.00    Types: Cigarettes    Quit date: 04/19/1998    Years since quitting: 23.7   Smokeless tobacco: Never   Tobacco comments:    quit smoking in 2000  Vaping Use   Vaping Use: Never used  Substance Use Topics   Alcohol use: No   Drug use: No     Allergies   Propoxyphene, Thioridazine hcl, Bupropion, Citalopram, Duloxetine, and Thioridazine   Review of Systems Review of Systems  Constitutional:  Negative for fever.  Gastrointestinal:  Negative for abdominal pain.  Genitourinary:  Positive for dysuria, frequency and urgency. Negative for hematuria, vaginal discharge and vaginal pain.  Musculoskeletal:  Positive for back pain.  Skin:  Negative for rash.  Neurological:  Negative for weakness and numbness.  Hematological: Negative.   Psychiatric/Behavioral: Negative.       Physical Exam Triage Vital Signs ED Triage Vitals  Enc Vitals Group     BP 12/30/21 1536 136/87     Pulse Rate 12/30/21 1536 92     Resp --      Temp 12/30/21 1536 98.2 F (36.8 C)     Temp Source 12/30/21 1536 Oral     SpO2 12/30/21 1536 96 %     Weight 12/30/21 1534 155 lb (70.3 kg)     Height 12/30/21 1534 '5\' 7"'$  (1.702 m)     Head Circumference --      Peak Flow --      Pain Score 12/30/21 1533 3     Pain Loc --      Pain Edu? --      Excl. in [place]? --    No data found.  Updated Vital Signs BP 136/87 (BP Location: Left Arm)   Pulse 92   Temp 98.2 F (36.8 C) (Oral)   Ht '5\' 7"'$  (1.702 m)   Wt 155 lb (70.3 kg)   SpO2 96%   BMI 24.28 kg/m    Visual Acuity Right Eye Distance:   Left Eye Distance:   Bilateral Distance:    Right Eye Near:   Left Eye Near:    Bilateral Near:     Physical Exam Vitals and nursing note reviewed.  Constitutional:      Appearance: Normal appearance. She is not ill-appearing.  HENT:     Head: Normocephalic and atraumatic.  Cardiovascular:     Rate and Rhythm: Normal rate and regular rhythm.     Pulses: Normal pulses.     Heart sounds: Normal heart sounds. No murmur heard.    No friction rub. No gallop.  Pulmonary:     Effort: Pulmonary effort is normal.     Breath sounds: Normal breath sounds. No wheezing, rhonchi or rales.  Abdominal:     General: Abdomen is flat.     Palpations: Abdomen is soft.     Tenderness: There is no abdominal tenderness. There is no right CVA tenderness or left CVA tenderness.  Musculoskeletal:        General: No tenderness.  Skin:    General: Skin is warm and dry.     Capillary Refill: Capillary refill takes less than 2 seconds.     Findings: No erythema or rash.  Neurological:     General: No focal deficit present.     Mental Status: She is alert and oriented to person, place, and time.  Psychiatric:        Mood and Affect: Mood normal.  Thought Content: Thought content normal.        Judgment: Judgment normal.      UC Treatments / Results  Labs (all labs ordered are listed, but only abnormal results are displayed) Labs Reviewed  URINALYSIS, ROUTINE W REFLEX MICROSCOPIC - Abnormal; Notable for the following components:      Result Value   APPearance HAZY (*)    Hgb urine dipstick MODERATE (*)    Leukocytes,Ua MODERATE (*)    All other components within normal limits  URINALYSIS, MICROSCOPIC (REFLEX) - Abnormal; Notable for the following components:   Bacteria, UA MANY (*)    All other components within normal limits  URINE CULTURE    EKG   Radiology No results found.  Procedures Procedures (including critical care  time)  Medications Ordered in UC Medications - No data to display  Initial Impression / Assessment and Plan / UC Course  I have reviewed the triage vital signs and the nursing notes.  Pertinent labs & imaging results that were available during my care of the patient were reviewed by me and considered in my medical decision making (see chart for details).   Patient is a pleasant, nontoxic-appearing 61 year old female here for evaluation of urinary complaints as outlined in HPI above.  She is also experiencing low back pain but this predates the onset of her urinary symptoms.  On exam patient has a benign cardiopulmonary exam revealing S1-S2 heart sounds with regular rate and rhythm and lung sounds are clear auscultation all fields.  No CVA tenderness on exam.  Abdomen is soft and nontender.  Patient has no midline lumbar spinous tenderness or lumbar paraspinous tenderness or spasm.  She states that it feels like its anterior of her lumbar sacral junction.  It does not increase with lateral rotation while she is sitting but states it does increase when she has forward flexion to tie her shoes.  She is moving all extremities equally in her bilateral EXTR strength is 5/5.  I suspect that she has a lumbar strain and I have advised her to continue using ibuprofen according to package instructions and I will prescribe baclofen to help her with the discomfort as it is nonsedating.  I will also order urinalysis.  Urinalysis shows hazy appearance with moderate hemoglobin and moderate leukocyte esterase.  Negative for nitrites or protein.  Reflex microscopy shows 11-20 RBCs, 21-50 WBCs, and many bacteria.  Also budding yeast.  I will send urine for culture.   Final Clinical Impressions(s) / UC Diagnoses   Final diagnoses:  Lower urinary tract infectious disease  Vaginal yeast infection  Lumbar strain, initial encounter     Discharge Instructions      Take the Macrobid twice daily for 5 days with food  for treatment of urinary tract infection.  Use the Pyridium every 8 hours as needed for urinary discomfort.  This will turn your urine a bright red-orange.  Increase your oral fluid intake so that you increase your urine production and or flushing your urinary system.  Take an over-the-counter probiotic, such as Culturelle-Align-Activia, 1 hour after each dose of antibiotic to prevent diarrhea or yeast infections from forming.  We will culture urine and change the antibiotics if necessary.  Your urinalysis also showed budding yeast which could mean that you are developing a vaginal yeast infection.  Take 1 Diflucan tablet now and repeat dosing when you have finished your antibiotics for your UTI.  For your low back pain I was use over-the-counter ibuprofen according  to the package instructions as needed for pain and inflammation.  Take the baclofen, 10 mg every 8 hours, as needed for muscle pain and spasm.  Return for reevaluation, or see your primary care provider, for any new or worsening symptoms.      ED Prescriptions     Medication Sig Dispense Auth. Provider   nitrofurantoin, macrocrystal-monohydrate, (MACROBID) 100 MG capsule Take 1 capsule (100 mg total) by mouth 2 (two) times daily. 10 capsule Margarette Canada, NP   phenazopyridine (PYRIDIUM) 200 MG tablet Take 1 tablet (200 mg total) by mouth 3 (three) times daily. 6 tablet Margarette Canada, NP   fluconazole (DIFLUCAN) 150 MG tablet Take 1 tablet (150 mg total) by mouth daily for 2 days. Take 1 tablet now and repeat when you have finished your antibiotic therapy. 2 tablet Margarette Canada, NP   baclofen (LIORESAL) 10 MG tablet Take 1 tablet (10 mg total) by mouth 3 (three) times daily. 68 each Margarette Canada, NP      PDMP not reviewed this encounter.   Margarette Canada, NP 12/30/21 [phone]

## 2021-12-30 NOTE — Discharge Instructions (Signed)
Take the Macrobid twice daily for 5 days with food for treatment of urinary tract infection.  Use the Pyridium every 8 hours as needed for urinary discomfort.  This will turn your urine a bright red-orange.  Increase your oral fluid intake so that you increase your urine production and or flushing your urinary system.  Take an over-the-counter probiotic, such as Culturelle-Align-Activia, 1 hour after each dose of antibiotic to prevent diarrhea or yeast infections from forming.  We will culture urine and change the antibiotics if necessary.  Your urinalysis also showed budding yeast which could mean that you are developing a vaginal yeast infection.  Take 1 Diflucan tablet now and repeat dosing when you have finished your antibiotics for your UTI.  For your low back pain I was use over-the-counter ibuprofen according to the package instructions as needed for pain and inflammation.  Take the baclofen, 10 mg every 8 hours, as needed for muscle pain and spasm.  Return for reevaluation, or see your primary care provider, for any new or worsening symptoms.

## 2022-01-01 LAB — URINE CULTURE: Culture: 30000 — AB

## 2022-01-04 ENCOUNTER — Encounter: Payer: Self-pay | Admitting: Hematology and Oncology

## 2022-01-05 ENCOUNTER — Telehealth: Payer: Self-pay

## 2022-01-05 ENCOUNTER — Other Ambulatory Visit: Payer: Self-pay | Admitting: Hematology and Oncology

## 2022-01-05 DIAGNOSIS — E538 Deficiency of other specified B group vitamins: Secondary | ICD-10-CM

## 2022-01-05 DIAGNOSIS — Z9889 Other specified postprocedural states: Secondary | ICD-10-CM

## 2022-01-05 DIAGNOSIS — C50212 Malignant neoplasm of upper-inner quadrant of left female breast: Secondary | ICD-10-CM

## 2022-01-05 NOTE — Telephone Encounter (Signed)
S/w pt regarding mychart message to discuss future visits. Pt was educated on how to properly using clean technique self-inject B12. She was educated on how to inject air into vial before drawing up, and where injections are appropriate. Pt will RTC in 6 mos for lab/MD visit 06/22/22. All future injection appts cancelled per pt request as she will be self-injecting at home.

## 2022-01-19 ENCOUNTER — Inpatient Hospital Stay

## 2022-02-16 ENCOUNTER — Inpatient Hospital Stay

## 2022-02-22 ENCOUNTER — Ambulatory Visit (INDEPENDENT_AMBULATORY_CARE_PROVIDER_SITE_OTHER)

## 2022-02-22 ENCOUNTER — Ambulatory Visit (INDEPENDENT_AMBULATORY_CARE_PROVIDER_SITE_OTHER): Admitting: Orthopaedic Surgery

## 2022-02-22 DIAGNOSIS — M25552 Pain in left hip: Secondary | ICD-10-CM | POA: Diagnosis not present

## 2022-02-22 NOTE — Progress Notes (Signed)
The patient is a very pleasant 61 year old female who has been dealing with left hip pain for about a year now which is slowly worsening.  She said it started aching quite a bit after being on tamoxifen for her breast cancer.  She has had just a little bit groin pain but most of her pain she points to is in sciatic region and low back to the left side and lateral aspect of her left hip.  She is very active.  She is an individual.  She is now breast cancer free.  She was given a prescription for Celebrex but has not taken it.  She is on Prilosec and a few other medications.  She is not a diabetic.  On exam she does not walk with a limp.  When I have her lay in a supine position her leg lengths are equal.  Both hips have full and fluid motion with no pain in the groin and no blocks or rotation of either hip.  When I have her lay on her right side with her left hip up she has just a little bit of pain over the trochanteric area of the left hip and more of her pain seems to be in the SI joint area and some of the lower back.  AP pelvis and lateral left hip shows normal-appearing hips bilaterally with well-maintained joint space.  There is no cortical irregularity around either trochanteric area.  I would like to send her to outpatient physical therapy and I gave her prescription for store physical therapy and medicine where she is from.  This will be for any modalities that can help decrease the pain she is experiencing.  I have encouraged her to take Celebrex as well.  I gave her reassurance that the least her hips do have normal.  I would like to see her back in 4 weeks.  If she is not having much improvement I would like an AP and lateral of her lumbar spine.  All questions and concerns were answered and addressed.

## 2022-02-28 NOTE — Progress Notes (Unsigned)
03/04/2022 Courtney Romero [id number] [birth year]  Referring provider: [name], MD Primary GI doctor: Dr. Havery Moros  ASSESSMENT AND PLAN:   Rectal bleeding with rectal discomfort Possible irritated prolapsed hemorrhoids posterior and or fissure Difficult rectal exam, some possible stenosis versus large hemorrhoids Patient is due 11/2023 for colonoscopy, will schedule sooner for evaluation with abnormal stools and abnormal exam.  Will give fiber, hemorrhoid creams, pending colon can schedule for banding here which patient had years ago versus surgical referral.  ? Pelvic floor versus constipation component with incomplete BM's, add on fiber, consider miralax or linzess  GERD with years of epigastric pain and nausea Will get Ab [place] to evaluate, normal CT 2021 EGD 2019 with benign polyps, unremarkable.  Since has been ongoing with negative cardiac work up 2021, will plan on EGD with colon.  If negative consider functional/anxiety component versus IBS?  Continue prilosec Lifestyle changes discussed, avoid NSAIDS, ETOH  History of adenomatous polyp of colon 11/2017 colonoscopy Dr. Vicente Males hemorrhoids, 2 small polyps removed 1 SS, recall 5 years.   Getting labs with PCP, declines here.  I recommend upper gastrointestinal and colorectal evaluation with an EGD and colonoscopy.  Risk of bowel prep, conscious sedation, and EGD and colonoscopy were discussed.  Risks include but are not limited to dehydration, pain, bleeding, cardiopulmonary process, bowel perforation, or other possible adverse outcomes..  Treatment plan was discussed with patient, and agreed upon.   History of Present Illness:  61 y.o. female  with a past medical history of hepatitis C treated at [facility], left breast cancer status 2019 post radiation therapy, GERD, hemorrhoids s/p banding remotely, nonobstructive CAD left heart cath 08/2019 and others listed below, returns to clinic today for evaluation of  noncardiac chest pain and hemorrhoids..  2013 abdominal ultrasound cholelithiasis 2015 CT abdomen pelvis questionable thickened jejunum 11/2017 EGD with Dr.Oddono A few benign gastric body polyps, otherwise normal esophagus.  Biopsies of the esophagus were normal without evidence of eosinophilic esophagitis, gastric polyps were fundic gland polyps.  No evidence of Barrett's esophagus.  11/2017 colonoscopy Dr. Vicente Males hemorrhoids, 2 small polyps removed 1 SS, recall 5 years.  08/14/2019 office visit Dr. Havery Moros for epigastric pain was being evaluated for cardiac etiology at that time a strong family history of CAD. 08/2019 cardiac catheterization showed nonobstructive plaque, treated medically 08/2019 CT AB and pelvis AB pain no acute findings of AB or pelvis, left adrenal adenoma, fat containing umbilical hernia.  12/07/2021 office visit with cardiology, continuing to have chest pain, nonexertional.  Has dull ache epigastric area, intermittent since 2021 but getting worse.  Several times a week, no aggravating factors, no radiation. Not worse with movement, can radiate into her chest at time but primarily epigastric.  No GERD, she is on prilosec daily. Nausea comes and goes, ativan helps for anxiety. No vomiting. No dysphagia.  She has BM every day, can have well formed stools but can hard stools which causes her hemorrhoids to be worse.  Rare BRB in the toilet, rectal itching/burning and discomfort. Has incomplete bowel movements.  Will drink miralax as needed or take colace, more as needed.  No weight loss.   CBC  12/22/2021  HGB 14.2 MCV 90.5  WBC 3.9 Platelets 158 12/22/2021  B12 360 Kidney function 12/22/2021  BUN 13 Cr 0.79  GFR >60  Potassium 3.8   LFTs 12/22/2021  AST 18 ALT 16 Alkphos 72 TBili 0.7 02/27/2020 TSH 0.22   She  reports that she quit smoking about  23 years ago. Her smoking use included cigarettes. She has a 30.00 pack-year smoking history. She has never used smokeless  tobacco. She reports that she does not drink alcohol and does not use drugs. Her family history includes Atrial fibrillation in her father; Coronary artery disease in her father; Diabetes in her brother; Heart failure in her father; Hypertension in her mother; Thyroid disease in her mother.   Current Medications:    Current Outpatient Medications (Cardiovascular):    rosuvastatin (CRESTOR) 20 MG tablet, Take 1 tablet (20 mg total) by mouth daily.   Current Outpatient Medications (Analgesics):    aspirin EC 81 MG tablet, Take 1 tablet (81 mg total) by mouth daily.  Current Outpatient Medications (Hematological):    cyanocobalamin (VITAMIN B12) 1000 MCG/ML injection, Inject 1 mL (1,000 mcg total) into the muscle every 30 (thirty) days.  Current Outpatient Medications (Other):    Calcium Carbonate (CALCIUM 600 PO), Take 600 mg by mouth daily.    cholecalciferol (VITAMIN D) 1000 units tablet, Take 1,000 Units by mouth daily.   hydrocortisone (ANUSOL-HC) 2.5 % rectal cream, Place 1 Application rectally 2 (two) times daily.   LORazepam (ATIVAN) 0.5 MG tablet, Take 0.5 mg by mouth every 8 (eight) hours as needed for anxiety.    Melatonin 5 MG TABS, Take 5 mg by mouth at bedtime.    omeprazole (PRILOSEC) 40 MG capsule, Take 40 mg by mouth daily.   baclofen (LIORESAL) 10 MG tablet, Take 1 tablet (10 mg total) by mouth 3 (three) times daily.   nitrofurantoin, macrocrystal-monohydrate, (MACROBID) 100 MG capsule, Take 1 capsule (100 mg total) by mouth 2 (two) times daily.   phenazopyridine (PYRIDIUM) 200 MG tablet, Take 1 tablet (200 mg total) by mouth 3 (three) times daily.   zolpidem (AMBIEN) 5 MG tablet, Take 5 mg by mouth at bedtime as needed for sleep.   Surgical History:  She  has a past surgical history that includes Hemorrhoid banding (2012); Upper gi endoscopy (2013); Colonoscopy (2011); Sigmoidoscopy (2013); Flexible sigmoidoscopy (February 12, 2013); Abdominal hysterectomy (2009);  Tonsilectomy/adenoidectomy with myringotomy (remote); breast cyst removal (1995); Breast lumpectomy with radioactive seed and sentinel lymph node biopsy (Left, 06/14/2017); Colonoscopy with propofol (N/A, 12/12/2017); Esophagogastroduodenoscopy (egd) with propofol (N/A, 12/12/2017); Breast lumpectomy (Left); and LEFT HEART CATH AND CORONARY ANGIOGRAPHY (N/A, 08/27/2019).  Current Medications, Allergies, Past Medical History, Past Surgical History, Family History and Social History were reviewed in [employer] record.  Physical Exam: BP 132/70   Pulse 80   Ht '5\' 7"'$  (1.702 m)   Wt 151 lb 2 oz (68.5 kg)   BMI 23.67 kg/m  General:   Pleasant, well developed female in no acute distress Heart : Regular rate and rhythm; no murmurs Pulm: Clear anteriorly; no wheezing Abdomen:  Soft, Non-distended AB, Active bowel sounds. mild tenderness in the RLQ. Without guarding and Without rebound, No organomegaly appreciated. Rectal: posterior left erythematous prolapsed hemorrhoid with irritation/fissure, increased rectal tone/difficult rectal exam, possible stenosis, tender, brown stool, FOBT negative Extremities:  without  edema. Neurologic:  Alert and  oriented x4;  No focal deficits.  Psych:  Cooperative. Normal mood and affect.   Vladimir Crofts, PA-C 03/04/22

## 2022-03-02 ENCOUNTER — Encounter (HOSPITAL_BASED_OUTPATIENT_CLINIC_OR_DEPARTMENT_OTHER): Payer: Self-pay | Admitting: Cardiology

## 2022-03-03 MED ORDER — ROSUVASTATIN CALCIUM 20 MG PO TABS: 20.0000 mg | ORAL_TABLET | Freq: Every day | ORAL | 3 refills | Status: DC

## 2022-03-04 ENCOUNTER — Ambulatory Visit (INDEPENDENT_AMBULATORY_CARE_PROVIDER_SITE_OTHER): Admitting: Physician Assistant

## 2022-03-04 ENCOUNTER — Encounter: Payer: Self-pay | Admitting: Gastroenterology

## 2022-03-04 ENCOUNTER — Encounter: Payer: Self-pay | Admitting: Physician Assistant

## 2022-03-04 VITALS — BP 132/70 | HR 80 | Ht 67.0 in | Wt 151.1 lb

## 2022-03-04 DIAGNOSIS — Z86010 Personal history of colonic polyps: Secondary | ICD-10-CM | POA: Diagnosis not present

## 2022-03-04 DIAGNOSIS — K6289 Other specified diseases of anus and rectum: Secondary | ICD-10-CM

## 2022-03-04 DIAGNOSIS — K219 Gastro-esophageal reflux disease without esophagitis: Secondary | ICD-10-CM | POA: Diagnosis not present

## 2022-03-04 DIAGNOSIS — K625 Hemorrhage of anus and rectum: Secondary | ICD-10-CM

## 2022-03-04 DIAGNOSIS — R1013 Epigastric pain: Secondary | ICD-10-CM

## 2022-03-04 DIAGNOSIS — R110 Nausea: Secondary | ICD-10-CM

## 2022-03-04 MED ORDER — NA SULFATE-K SULFATE-MG SULF 17.5-3.13-1.6 GM/177ML PO SOLN: 1.0000 | Freq: Once | ORAL | 0 refills | Status: AC

## 2022-03-04 MED ORDER — HYDROCORTISONE (PERIANAL) 2.5 % EX CREA: 1.0000 | TOPICAL_CREAM | Freq: Two times a day (BID) | CUTANEOUS | 2 refills | Status: DC

## 2022-03-04 NOTE — Progress Notes (Signed)
Agree with assessment and plan as outlined.  

## 2022-03-04 NOTE — Patient Instructions (Signed)
_______________________________________________________  If you are age 60 or older, your body mass index should be between 23-30. Your Body mass index is 23.67 kg/m. If this is out of the aforementioned range listed, please consider follow up with your Primary Care Provider.  If you are age 38 or younger, your body mass index should be between 19-25. Your Body mass index is 23.67 kg/m. If this is out of the aformentioned range listed, please consider follow up with your Primary Care Provider.   ________________________________________________________  The [facility] providers would like to encourage you to use [facility] to communicate with providers for non-urgent requests or questions.  Due to long hold times on the telephone, sending your provider a message by [facility] may be a faster and more efficient way to get a response.  Please allow 48 business hours for a response.  Please remember that this is for non-urgent requests.  _______________________________________________________  Dennis Bast have been scheduled for an endoscopy and colonoscopy. Please follow the written instructions given to you at your visit today. Please pick up your prep supplies at the pharmacy within the next 1-3 days. If you use inhalers (even only as needed), please bring them with you on the day of your procedure.   Due to recent changes in healthcare laws, you may see the results of your imaging and laboratory studies on MyChart before your provider has had a chance to review them.  We understand that in some cases there may be results that are confusing or concerning to you. Not all laboratory results come back in the same time frame and the provider may be waiting for multiple results in order to interpret others.  Please give [place] 48 hours in order for your provider to thoroughly review all the results before contacting the office for clarification of your results.    It was a pleasure to see you today!  Thank you for  trusting me with your gastrointestinal care!

## 2022-03-15 ENCOUNTER — Ambulatory Visit
Admission: RE | Admit: 2022-03-15 | Discharge: 2022-03-15 | Disposition: A | Discharge status: Home / Self Care | Source: Ambulatory Visit | Attending: Physician Assistant | Admitting: Physician Assistant

## 2022-03-15 DIAGNOSIS — K219 Gastro-esophageal reflux disease without esophagitis: Secondary | ICD-10-CM | POA: Diagnosis present

## 2022-03-15 DIAGNOSIS — R1013 Epigastric pain: Secondary | ICD-10-CM | POA: Diagnosis present

## 2022-03-16 ENCOUNTER — Inpatient Hospital Stay

## 2022-03-29 ENCOUNTER — Ambulatory Visit (INDEPENDENT_AMBULATORY_CARE_PROVIDER_SITE_OTHER): Admitting: Orthopaedic Surgery

## 2022-03-29 ENCOUNTER — Ambulatory Visit (INDEPENDENT_AMBULATORY_CARE_PROVIDER_SITE_OTHER)

## 2022-03-29 ENCOUNTER — Encounter: Payer: Self-pay | Admitting: Orthopaedic Surgery

## 2022-03-29 DIAGNOSIS — M25552 Pain in left hip: Secondary | ICD-10-CM

## 2022-03-29 DIAGNOSIS — M7062 Trochanteric bursitis, left hip: Secondary | ICD-10-CM

## 2022-03-29 NOTE — Progress Notes (Signed)
The patient is a 61 year old female who comes in with continued left hip and left-sided low back pain.  She has been through physical therapy and said that it is not really help.  She defers any type of injections.  She points to the low back on the left side but also the trochanteric area of her left hip as a source of her pain.  She denies any groin pain.  Previous x-rays of the pelvis and both hips are normal.  On exam her left hip moves smoothly and fluidly.  Her pain is only over the trochanteric area in terms of her hip exam.  She does have some pain in sciatic region in the low back to the left side.  She has negative straight leg raise as well.  She is a thin individual.  X-rays again reviewed from her pelvis and left hip and these were normal.  I showed her the x-rays and this was from earlier this year.  2 views today of the lumbar spine are normal-appearing with normal alignment and normal disc height and space.  She is not on a try Voltaren gel on her hip and her shoulders and hip stretching exercises to try.  I did offer her a steroid injection of the trochanteric area but she has deferred this.  Follow-up will be as needed.  However, if she decides to have a steroid injection over the trochanteric area she will let us know.

## 2022-04-09 ENCOUNTER — Ambulatory Visit
Admission: RE | Admit: 2022-04-09 | Discharge: 2022-04-09 | Disposition: A | Discharge status: Home / Self Care | Source: Ambulatory Visit | Attending: Hematology and Oncology | Admitting: Hematology and Oncology

## 2022-04-09 DIAGNOSIS — Z9889 Other specified postprocedural states: Secondary | ICD-10-CM

## 2022-04-13 ENCOUNTER — Inpatient Hospital Stay

## 2022-04-20 ENCOUNTER — Encounter: Payer: Self-pay | Admitting: Certified Registered Nurse Anesthetist

## 2022-04-21 ENCOUNTER — Ambulatory Visit (AMBULATORY_SURGERY_CENTER): Admitting: Gastroenterology

## 2022-04-21 ENCOUNTER — Encounter: Payer: Self-pay | Admitting: Gastroenterology

## 2022-04-21 VITALS — BP 122/63 | HR 81 | Temp 98.0°F | Resp 13 | Ht 67.0 in | Wt 151.0 lb

## 2022-04-21 DIAGNOSIS — D125 Benign neoplasm of sigmoid colon: Secondary | ICD-10-CM | POA: Diagnosis not present

## 2022-04-21 DIAGNOSIS — K219 Gastro-esophageal reflux disease without esophagitis: Secondary | ICD-10-CM

## 2022-04-21 DIAGNOSIS — K625 Hemorrhage of anus and rectum: Secondary | ICD-10-CM

## 2022-04-21 DIAGNOSIS — D122 Benign neoplasm of ascending colon: Secondary | ICD-10-CM | POA: Diagnosis not present

## 2022-04-21 DIAGNOSIS — Z09 Encounter for follow-up examination after completed treatment for conditions other than malignant neoplasm: Secondary | ICD-10-CM

## 2022-04-21 DIAGNOSIS — K317 Polyp of stomach and duodenum: Secondary | ICD-10-CM

## 2022-04-21 DIAGNOSIS — Z86010 Personal history of colonic polyps: Secondary | ICD-10-CM

## 2022-04-21 DIAGNOSIS — R1013 Epigastric pain: Secondary | ICD-10-CM

## 2022-04-21 DIAGNOSIS — K319 Disease of stomach and duodenum, unspecified: Secondary | ICD-10-CM

## 2022-04-21 DIAGNOSIS — R110 Nausea: Secondary | ICD-10-CM

## 2022-04-21 MED ORDER — SODIUM CHLORIDE 0.9 % IV SOLN: 500.0000 mL | Freq: Once | INTRAVENOUS | Status: DC

## 2022-04-21 NOTE — Progress Notes (Signed)
Called to room to assist during endoscopic procedure.  Patient ID and intended procedure confirmed with present staff. Received instructions for my participation in the procedure from the performing physician.  

## 2022-04-21 NOTE — Op Note (Signed)
[facility] Patient Name: Courtney Romero Procedure Date: 04/21/2022 1:29 PM MRN: [id number] Endoscopist: Remo Lipps P. Havery Moros , MD, [id number] Age: 62 Referring MD:  Date of Birth: 04-Sep-1960 Gender: Female Account #: [id number] Procedure:                Colonoscopy Indications:              Rectal bleeding , also history of 2 polyps removed                            2019 (Dr. Vicente Males) Medicines:                Monitored Anesthesia Care Procedure:                Pre-Anesthesia Assessment:                           - Prior to the procedure, a History and Physical                            was performed, and patient medications and                            allergies were reviewed. The patient's tolerance of                            previous anesthesia was also reviewed. The risks                            and benefits of the procedure and the sedation                            options and risks were discussed with the patient.                            All questions were answered, and informed consent                            was obtained. Prior Anticoagulants: The patient has                            taken no anticoagulant or antiplatelet agents. ASA                            Grade Assessment: II - A patient with mild systemic                            disease. After reviewing the risks and benefits,                            the patient was deemed in satisfactory condition to                            undergo the procedure.  After obtaining informed consent, the colonoscope                            was passed under direct vision. Throughout the                            procedure, the patient's blood pressure, pulse, and                            oxygen saturations were monitored continuously. The                            PCF-HQ190L Colonoscope was introduced through the                            anus and advanced to the the  terminal ileum, with                            identification of the appendiceal orifice and IC                            valve. The colonoscopy was performed without                            difficulty. The patient tolerated the procedure                            well. The quality of the bowel preparation was                            good. The terminal ileum, ileocecal valve,                            appendiceal orifice, and rectum were photographed. Scope In: 1:48:19 PM Scope Out: 2:03:54 PM Scope Withdrawal Time: 0 hours 12 minutes 58 seconds  Total Procedure Duration: 0 hours 15 minutes 35 seconds  Findings:                 Skin tags were found on perianal exam.                           The terminal ileum appeared normal.                           A 3 mm polyp was found in the ascending colon. The                            polyp was sessile. The polyp was removed with a                            cold snare. Resection and retrieval were complete.                           A 4 mm polyp was found  in the sigmoid colon. The                            polyp was sessile. The polyp was removed with a                            cold snare. Resection and retrieval were complete.                           A few small-mouthed diverticula were found in the                            sigmoid colon.                           Internal hemorrhoids were found during                            retroflexion. The hemorrhoids were moderate.                           The exam was otherwise without abnormality. Complications:            No immediate complications. Estimated blood loss:                            Minimal. Estimated Blood Loss:     Estimated blood loss was minimal. Impression:               - Perianal skin tags found on perianal exam.                           - The examined portion of the ileum was normal.                           - One 3 mm polyp in the ascending colon,  removed                            with a cold snare. Resected and retrieved.                           - One 4 mm polyp in the sigmoid colon, removed with                            a cold snare. Resected and retrieved.                           - Diverticulosis in the sigmoid colon.                           - Internal hemorrhoids.                           - The examination was otherwise normal.  Internal hemorrhoids are the most likely cause of                            bleeding symptoms in the setting of constipation. Recommendation:           - Patient has a contact number available for                            emergencies. The signs and symptoms of potential                            delayed complications were discussed with the                            patient. Return to normal activities tomorrow.                            Written discharge instructions were provided to the                            patient.                           - Resume previous diet.                           - Continue present medications.                           - Start MIralax daily as needed to keep stools soft                           - Await pathology results.                           - Patient is a good candidate for hemorrhoid                            banding to more definitively treat hemorrhoids /                            minimize bleeding symptoms, will discuss with her                            and schedule if she wishes to proceed Remo Lipps P. Syliva Mee, MD 04/21/2022 2:09:53 PM This report has been signed electronically.

## 2022-04-21 NOTE — Op Note (Signed)
[facility] Patient Name: Courtney Romero Procedure Date: 04/21/2022 1:29 PM MRN: [id number] Endoscopist: Remo Lipps P. Havery Moros , MD, [id number] Age: 62 Referring MD:  Date of Birth: Jan 09, 1961 Gender: Female Account #: [id number] Procedure:                Upper GI endoscopy Indications:              Epigastric abdominal pain, history of                            gastro-esophageal reflux disease on omeprazole '40mg'$                             / day, recent abdominal US negative Medicines:                Monitored Anesthesia Care Procedure:                Pre-Anesthesia Assessment:                           - Prior to the procedure, a History and Physical                            was performed, and patient medications and                            allergies were reviewed. The patient's tolerance of                            previous anesthesia was also reviewed. The risks                            and benefits of the procedure and the sedation                            options and risks were discussed with the patient.                            All questions were answered, and informed consent                            was obtained. Prior Anticoagulants: The patient has                            taken no anticoagulant or antiplatelet agents. ASA                            Grade Assessment: II - A patient with mild systemic                            disease. After reviewing the risks and benefits,                            the patient was deemed in satisfactory condition to  undergo the procedure.                           After obtaining informed consent, the endoscope was                            passed under direct vision. Throughout the                            procedure, the patient's blood pressure, pulse, and                            oxygen saturations were monitored continuously. The                            GIF HQ190 #[id number]  was introduced through the                            mouth, and advanced to the second part of duodenum.                            The upper GI endoscopy was accomplished without                            difficulty. The patient tolerated the procedure                            well. Scope In: Scope Out: Findings:                 Esophagogastric landmarks were identified: the                            Z-line was found at 40 cm, the gastroesophageal                            junction was found at 40 cm and the upper extent of                            the gastric folds was found at 41 cm from the                            incisors.                           A 1 cm hiatal hernia was present.                           The exam of the esophagus was otherwise normal.                           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 Biopsies were                            taken with a cold forceps for histology as a                            representative sample.                           Patchy mildly erythematous mucosa was found in the                            gastric fundus.                           The exam of the stomach was otherwise normal.                           Biopsies were taken with a cold forceps in the                            gastric body, at the incisura and in the gastric                            antrum for Helicobacter pylori testing.                           The examined duodenum was normal. Complications:            No immediate complications. Estimated blood loss:                            Minimal. Estimated Blood Loss:     Estimated blood loss was minimal. Impression:               - Esophagogastric landmarks identified.                           - 1 cm hiatal hernia.                           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 Biopsied.                           - Erythematous mucosa in the gastric fundus.                           - Normal stomach otherwise. Biopsies taken to rule                            out  H pylori                           - Normal examined duodenum.                           No overt cause for abdominal pain on this exam. Recommendation:           - Patient has a contact number available for                            emergencies. The signs and symptoms of potential                            delayed complications were discussed with the                            patient. Return to normal activities tomorrow.                            Written discharge instructions were provided to the                            patient.                           - Resume previous diet.                           - Continue present medications.                           - Await pathology results.                           - Consider cross sectional imaging with CT scan if                            symptoms persist and biopsies negative Remo Lipps P. Franciszek Platten, MD 04/21/2022 2:18:52 PM This report has been signed electronically.

## 2022-04-21 NOTE — Progress Notes (Signed)
1348 HR > 100 with esmolol 25 mg given IV, MD updated, vss

## 2022-04-21 NOTE — Progress Notes (Signed)
1314 Robinul 0.1 mg IV given due large amount of secretions upon assessment.  MD made aware, vss

## 2022-04-21 NOTE — Patient Instructions (Addendum)
Information on polyps, diverticulosis and hemorrhoids given to you today. Also info on hemorrhoid banding.  Await pathology results.  Resume previous diet and medications.  Consider cross sectional imaging with CT scan if symptoms persist and biopsies negative.  Start Miralax daily as needed to keep stools soft.    YOU HAD AN ENDOSCOPIC PROCEDURE TODAY AT [facility]:   Refer to the procedure report that was given to you for any specific questions about what was found during the examination.  If the procedure report does not answer your questions, please call your gastroenterologist to clarify.  If you requested that your care partner not be given the details of your procedure findings, then the procedure report has been included in a sealed envelope for you to review at your convenience later.  YOU SHOULD EXPECT: Some feelings of bloating in the abdomen. Passage of more gas than usual.  Walking can help get rid of the air that was put into your GI tract during the procedure and reduce the bloating. If you had a lower endoscopy (such as a colonoscopy or flexible sigmoidoscopy) you may notice spotting of blood in your stool or on the toilet paper. If you underwent a bowel prep for your procedure, you may not have a normal bowel movement for a few days.  Please Note:  You might notice some irritation and congestion in your nose or some drainage.  This is from the oxygen used during your procedure.  There is no need for concern and it should clear up in a day or so.  SYMPTOMS TO REPORT IMMEDIATELY:  Following lower endoscopy (colonoscopy or flexible sigmoidoscopy):  Excessive amounts of blood in the stool  Significant tenderness or worsening of abdominal pains  Swelling of the abdomen that is new, acute  Fever of 100F or higher  Following upper endoscopy (EGD)  Vomiting of blood or coffee ground material  New chest pain or pain under the shoulder blades  Painful or  persistently difficult swallowing  New shortness of breath  Fever of 100F or higher  Black, tarry-looking stools  For urgent or emergent issues, a gastroenterologist can be reached at any hour by calling [phone]. Do not use MyChart messaging for urgent concerns.    DIET:  We do recommend a small meal at first, but then you may proceed to your regular diet.  Drink plenty of fluids but you should avoid alcoholic beverages for 24 hours.  ACTIVITY:  You should plan to take it easy for the rest of today and you should NOT DRIVE or use heavy machinery until tomorrow (because of the sedation medicines used during the test).    FOLLOW UP: Our staff will call the number listed on your records the next business day following your procedure.  We will call around 7:15- 8:00 am to check on you and address any questions or concerns that you may have regarding the information given to you following your procedure. If we do not reach you, we will leave a message.     If any biopsies were taken you will be contacted by phone or by letter within the next 1-3 weeks.  Please call us at [phone] if you have not heard about the biopsies in 3 weeks.    SIGNATURES/CONFIDENTIALITY: You and/or your care partner have signed paperwork which will be entered into your electronic medical record.  These signatures attest to the fact that that the information above on your After Visit Summary has  been reviewed and is understood.  Full responsibility of the confidentiality of this discharge information lies with you and/or your care-partner.

## 2022-04-21 NOTE — Progress Notes (Signed)
Report given to PACU, vss 

## 2022-04-21 NOTE — Progress Notes (Signed)
[facility] History and Physical   Primary Care Physician:  [name], MD   Reason for Procedure:   Rectal bleeding, history of colon polyps, epigastric discomfort, GERD  Plan:    EGD and colonoscopy   HPI: Courtney Romero is a 62 y.o. female  here for EGd and colonoscopy to evaluate issues as outlined above. Patient endorses intermittent rectal bleeding, she thinks due to hemorrhoids. Bowels alternate, but often has constipation. NO FH of CRC. Otherwise ongoing intermittent epigastric pain. Taking omeprazole '40mg'$  for GERD but symptoms persist. [place] negative.  Otherwise feels well without any cardiopulmonary symptoms.   I have discussed risks / benefits of anesthesia and endoscopic procedure with Franky Macho and they wish to proceed with the exams as outlined today.    Past Medical History:  Diagnosis Date   Abnormal CAT scan 2013   Anxiety    Cancer [facility])    Family history of breast cancer    Family history of pancreatic cancer    Family history of prostate cancer    GERD (gastroesophageal reflux disease)    Hepatitis C 2001   Followed by Dr. Charlean Sanfilippo at [facility]   History of radiation therapy 07/26/17- 08/22/17   40.05 Gy directed to the left breast in 15 fractions followed by a boost of 10 Gy in 5 fractions.    Personal history of radiation therapy    PONV (postoperative nausea and vomiting)    Thyroid disease     Past Surgical History:  Procedure Laterality Date   ABDOMINAL HYSTERECTOMY  2009   breast cyst removal  1995   BREAST LUMPECTOMY Left    BREAST LUMPECTOMY WITH RADIOACTIVE SEED AND SENTINEL LYMPH NODE BIOPSY Left 06/14/2017   Procedure: LEFT BREAST SEED LOCALIZED LUMPECTOMY (2 SEEDS) AND SENTINEL LYMPH NODE BIOPSY;  Surgeon: Erroll Luna, MD;  Location: [facility];  Service: General;  Laterality: Left;   COLONOSCOPY  2011   COLONOSCOPY WITH PROPOFOL N/A 12/12/2017   Procedure: COLONOSCOPY WITH PROPOFOL;  Surgeon:  Jonathon Bellows, MD;  Location: [facility] ENDOSCOPY;  Service: Gastroenterology;  Laterality: N/A;   ESOPHAGOGASTRODUODENOSCOPY (EGD) WITH PROPOFOL N/A 12/12/2017   Procedure: ESOPHAGOGASTRODUODENOSCOPY (EGD) WITH PROPOFOL;  Surgeon: Jonathon Bellows, MD;  Location: [facility] ENDOSCOPY;  Service: Gastroenterology;  Laterality: N/A;   FLEXIBLE SIGMOIDOSCOPY  February 12, 2013   have normal perianal exam, question focal prolapse. 2 small scars in the rectum.   HEMORRHOID BANDING  2012   3 times over three months   LEFT HEART CATH AND CORONARY ANGIOGRAPHY N/A 08/27/2019   Procedure: LEFT HEART CATH AND CORONARY ANGIOGRAPHY;  Surgeon: Jolaine Artist, MD;  Location: [facility];  Service: Cardiovascular;  Laterality: N/A;   SIGMOIDOSCOPY  2013   TONSILECTOMY/ADENOIDECTOMY WITH MYRINGOTOMY  remote   UPPER GI ENDOSCOPY  2013    Prior to Admission medications   Medication Sig Start Date End Date Taking? Authorizing Provider  aspirin EC 81 MG tablet Take 1 tablet (81 mg total) by mouth daily. 04/02/20  Yes Bensimhon, Shaune Pascal, MD  Calcium Carbonate (CALCIUM 600 PO) Take 600 mg by mouth daily.    Yes [provider]  cholecalciferol (VITAMIN D) 1000 units tablet Take 1,000 Units by mouth daily.   Yes [provider]  hydrocortisone (ANUSOL-HC) 2.5 % rectal cream Place 1 Application rectally 2 (two) times daily. 03/04/22  Yes Vicie Mutters R, PA-C  LORazepam (ATIVAN) 0.5 MG tablet Take 0.5 mg by mouth every 8 (eight) hours  as needed for anxiety.    Yes [provider]  omeprazole (PRILOSEC) 40 MG capsule Take 40 mg by mouth daily.   Yes [provider]  rosuvastatin (CRESTOR) 20 MG tablet Take 1 tablet (20 mg total) by mouth daily. 03/03/22  Yes Jerline Pain, MD  baclofen (LIORESAL) 10 MG tablet Take 1 tablet (10 mg total) by mouth 3 (three) times daily. Patient not taking: Reported on 04/21/2022 12/30/21   Margarette Canada, NP  cyanocobalamin (VITAMIN B12) 1000 MCG/ML injection  Inject 1 mL (1,000 mcg total) into the muscle every 30 (thirty) days. 12/22/21   Nicholas Lose, MD  Melatonin 5 MG TABS Take 5 mg by mouth at bedtime.     [provider]  nitrofurantoin, macrocrystal-monohydrate, (MACROBID) 100 MG capsule Take 1 capsule (100 mg total) by mouth 2 (two) times daily. Patient not taking: Reported on 04/21/2022 12/30/21   Margarette Canada, NP  phenazopyridine (PYRIDIUM) 200 MG tablet Take 1 tablet (200 mg total) by mouth 3 (three) times daily. Patient not taking: Reported on 04/21/2022 12/30/21   Margarette Canada, NP  zolpidem (AMBIEN) 5 MG tablet Take 5 mg by mouth at bedtime as needed for sleep.  02/06/15 03/03/21  [provider]    Current Outpatient Medications  Medication Sig Dispense Refill   aspirin EC 81 MG tablet Take 1 tablet (81 mg total) by mouth daily. 90 tablet 3   Calcium Carbonate (CALCIUM 600 PO) Take 600 mg by mouth daily.      cholecalciferol (VITAMIN D) 1000 units tablet Take 1,000 Units by mouth daily.     hydrocortisone (ANUSOL-HC) 2.5 % rectal cream Place 1 Application rectally 2 (two) times daily. 30 g 2   LORazepam (ATIVAN) 0.5 MG tablet Take 0.5 mg by mouth every 8 (eight) hours as needed for anxiety.      omeprazole (PRILOSEC) 40 MG capsule Take 40 mg by mouth daily.     rosuvastatin (CRESTOR) 20 MG tablet Take 1 tablet (20 mg total) by mouth daily. 90 tablet 3   baclofen (LIORESAL) 10 MG tablet Take 1 tablet (10 mg total) by mouth 3 (three) times daily. (Patient not taking: Reported on 04/21/2022) 30 each 0   cyanocobalamin (VITAMIN B12) 1000 MCG/ML injection Inject 1 mL (1,000 mcg total) into the muscle every 30 (thirty) days. 10 mL 3   Melatonin 5 MG TABS Take 5 mg by mouth at bedtime.      nitrofurantoin, macrocrystal-monohydrate, (MACROBID) 100 MG capsule Take 1 capsule (100 mg total) by mouth 2 (two) times daily. (Patient not taking: Reported on 04/21/2022) 10 capsule 0   phenazopyridine (PYRIDIUM) 200 MG tablet Take 1 tablet (200 mg  total) by mouth 3 (three) times daily. (Patient not taking: Reported on 04/21/2022) 6 tablet 0   zolpidem (AMBIEN) 5 MG tablet Take 5 mg by mouth at bedtime as needed for sleep.      Current Facility-Administered Medications  Medication Dose Route Frequency Provider Last Rate Last Admin   0.9 %  sodium chloride infusion  500 mL Intravenous Once Chinmayi Rumer, Carlota Raspberry, MD        Allergies as of 04/21/2022 - Review Complete 04/21/2022  Allergen Reaction Noted   Propoxyphene Rash 02/06/2015   Thioridazine hcl     Bupropion Rash and Other (See Comments) 04/05/2012   Citalopram Rash and Other (See Comments) 12/13/2013   Duloxetine Rash and Other (See Comments) 04/05/2012   Thioridazine Rash 02/24/2015    Family History  Problem Relation Age  of Onset   Hypertension Mother    Thyroid disease Mother        Multi problems   Coronary artery disease Father    Heart failure Father    Atrial fibrillation Father    Diabetes Brother    Colon cancer Neg Hx    Esophageal cancer Neg Hx    Rectal cancer Neg Hx    Stomach cancer Neg Hx     Social History   Socioeconomic History   Marital status: Married    Spouse name: Not on file   Number of children: Not on file   Years of education: Not on file   Highest education level: Not on file  Occupational History   Occupation: Full Time  Tobacco Use   Smoking status: Former    Packs/day: 1.50    Years: 20.00    Total pack years: 30.00    Types: Cigarettes    Quit date: 04/19/1998    Years since quitting: 24.0   Smokeless tobacco: Never   Tobacco comments:    quit smoking in 2000  Vaping Use   Vaping Use: Never used  Substance and Sexual Activity   Alcohol use: No   Drug use: No   Sexual activity: Yes  Other Topics Concern   Not on file  Social History Narrative   Regular exercise: Yes   Social Determinants of Health   Financial Resource Strain: Not on file  Food Insecurity: Not on file  Transportation Needs: Not on file   Physical Activity: Not on file  Stress: Not on file  Social Connections: Not on file  Intimate Partner Violence: Not on file    Review of Systems: All other review of systems negative except as mentioned in the HPI.  Physical Exam: Vital signs BP (!) 162/90   Pulse 82   Temp 98 F (36.7 C)   Resp 16   Ht '5\' 7"'$  (1.702 m)   Wt 151 lb (68.5 kg)   SpO2 100%   BMI 23.65 kg/m   General:   Alert,  Well-developed, pleasant and cooperative in NAD Lungs:  Clear throughout to auscultation.   Heart:  Regular rate and rhythm Abdomen:  Soft, nontender and nondistended.   Neuro/Psych:  Alert and cooperative. Normal mood and affect. A and O x 3  Jolly Mango, MD [facility]

## 2022-04-21 NOTE — Progress Notes (Signed)
1339 HR > 100 with esmolol 25 mg given IV, MD updated, vss

## 2022-04-22 ENCOUNTER — Telehealth: Payer: Self-pay

## 2022-04-22 ENCOUNTER — Telehealth: Payer: Self-pay | Admitting: *Deleted

## 2022-04-22 NOTE — Telephone Encounter (Signed)
  Follow up Call-     04/21/2022   12:42 PM  Call back number  Post procedure Call Back phone  # [phone]  Permission to leave phone message Yes     Patient questions:  Do you have a fever, pain , or abdominal swelling? No. Pain Score  0 *  Have you tolerated food without any problems? Yes.    Have you been able to return to your normal activities? Yes.    Do you have any questions about your discharge instructions: Diet   No. Medications  No. Follow up visit  No.  Do you have questions or concerns about your Care? No.  Actions: * If pain score is 4 or above: No action needed, pain <4.

## 2022-04-22 NOTE — Telephone Encounter (Signed)
-----   Message from Yetta Flock, MD sent at 04/21/2022  3:52 PM EST ----- Regarding: hemorrhoid banding Jan can you help contact this patient and schedule her for hemorrhoid banding? Thanks

## 2022-04-22 NOTE — Telephone Encounter (Signed)
Called and spoke to patient. Scheduled her for Jan 31st at 3:40pm, to arrive at 3:30pm

## 2022-05-05 ENCOUNTER — Ambulatory Visit: Admitting: Hematology and Oncology

## 2022-05-11 ENCOUNTER — Inpatient Hospital Stay

## 2022-05-19 ENCOUNTER — Encounter: Admitting: Gastroenterology

## 2022-05-19 ENCOUNTER — Ambulatory Visit
Admission: EM | Admit: 2022-05-19 | Discharge: 2022-05-19 | Disposition: A | Discharge status: Home / Self Care | Attending: Family Medicine | Admitting: Family Medicine

## 2022-05-19 DIAGNOSIS — Z1152 Encounter for screening for COVID-19: Secondary | ICD-10-CM | POA: Insufficient documentation

## 2022-05-19 DIAGNOSIS — R051 Acute cough: Secondary | ICD-10-CM | POA: Diagnosis not present

## 2022-05-19 DIAGNOSIS — R110 Nausea: Secondary | ICD-10-CM | POA: Diagnosis not present

## 2022-05-19 DIAGNOSIS — R490 Dysphonia: Secondary | ICD-10-CM | POA: Insufficient documentation

## 2022-05-19 DIAGNOSIS — J029 Acute pharyngitis, unspecified: Secondary | ICD-10-CM

## 2022-05-19 DIAGNOSIS — Z87891 Personal history of nicotine dependence: Secondary | ICD-10-CM | POA: Insufficient documentation

## 2022-05-19 LAB — RESP PANEL BY RT-PCR (RSV, FLU A&B, COVID)  RVPGX2
Influenza A by PCR: NEGATIVE
Influenza B by PCR: NEGATIVE
Resp Syncytial Virus by PCR: NEGATIVE
SARS Coronavirus 2 by RT PCR: NEGATIVE

## 2022-05-19 LAB — GROUP A STREP BY PCR: Group A Strep by PCR: NOT DETECTED

## 2022-05-19 MED ORDER — ONDANSETRON HCL 4 MG PO TABS: 4.0000 mg | ORAL_TABLET | Freq: Four times a day (QID) | ORAL | 0 refills | Status: DC

## 2022-05-19 NOTE — Discharge Instructions (Addendum)
Your strep, COVID, influenza and RSV testing were all negative.  I suspect that you have a common upper respiratory infection.  I sent some nausea medicine called ondansetron/Zofran to your pharmacy.  You can use your prescription cough syrup as needed for your cough.  Continue using your throat lozenges.  Gargling with warm salt water and drinking warm tea can help with your sore throat.  Take Tylenol and/or Motrin as needed for pain/discomfort.

## 2022-05-19 NOTE — ED Triage Notes (Signed)
Patient presents to UC for sore throat, cough, hoarseness x 3 days. Not taking any medications.

## 2022-05-19 NOTE — ED Provider Notes (Signed)
[facility]    CSN: [id number] Arrival date & time: 05/19/22  1527      History   Chief Complaint Chief Complaint  Patient presents with   Hoarse   Cough   Sore Throat    HPI Courtney Romero is a 62 y.o. female.   HPI   [name] presents for sore throat, hoarseness, cough for the past 3 days. Has nausea but no vomiting or diarrhea. Used some cough lozenges and is taking antibiotics for UTI.  No known fever.        Past Medical History:  Diagnosis Date   Abnormal CAT scan 2013   Anxiety    Cancer [facility])    Family history of breast cancer    Family history of pancreatic cancer    Family history of prostate cancer    GERD (gastroesophageal reflux disease)    Hepatitis C 2001   Followed by Dr. Charlean Sanfilippo at [facility]   History of radiation therapy 07/26/17- 08/22/17   40.05 Gy directed to the left breast in 15 fractions followed by a boost of 10 Gy in 5 fractions.    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/27/2017   Hepatitis C, chronic ([place]) 09/21/2016   Health care maintenance 07/29/2015   Menopausal disorder 01/20/2014   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   Rectal pain 05/31/2013   Hemorrhoids 05/31/2013   Rectal fissure 11/20/2012   Small intestinal bacterial overgrowth 10/11/2012   Bloating 10/03/2012   Nausea 10/03/2012   Anal pain 05/09/2012   Anxiety and depression 04/04/2012   Diverticulosis 03/21/2012    SHORTNESS OF BREATH 01/08/2009    Past Surgical History:  Procedure Laterality Date   ABDOMINAL HYSTERECTOMY  2009   breast cyst removal  1995   BREAST LUMPECTOMY Left    BREAST LUMPECTOMY WITH RADIOACTIVE SEED AND SENTINEL LYMPH NODE BIOPSY Left 06/14/2017   Procedure: LEFT BREAST SEED LOCALIZED LUMPECTOMY (2 SEEDS) AND SENTINEL LYMPH NODE BIOPSY;  Surgeon: Erroll Luna, MD;  Location: [facility];  Service: General;  Laterality: Left;   COLONOSCOPY  2011   COLONOSCOPY WITH PROPOFOL N/A 12/12/2017   Procedure: COLONOSCOPY WITH PROPOFOL;  Surgeon: Jonathon Bellows, MD;  Location: [facility] ENDOSCOPY;  Service: Gastroenterology;  Laterality: N/A;   ESOPHAGOGASTRODUODENOSCOPY (EGD) WITH PROPOFOL N/A 12/12/2017   Procedure: ESOPHAGOGASTRODUODENOSCOPY (EGD) WITH PROPOFOL;  Surgeon: Jonathon Bellows, MD;  Location: [facility] ENDOSCOPY;  Service: Gastroenterology;  Laterality: N/A;   FLEXIBLE SIGMOIDOSCOPY  February 12, 2013   have normal perianal exam, question focal prolapse. 2 small scars in the rectum.   HEMORRHOID BANDING  2012   3 times over three months   LEFT HEART CATH AND CORONARY ANGIOGRAPHY N/A 08/27/2019   Procedure: LEFT HEART CATH AND CORONARY ANGIOGRAPHY;  Surgeon: Jolaine Artist, MD;  Location: [place] [facility];  Service: Cardiovascular;  Laterality: N/A;   SIGMOIDOSCOPY  2013   TONSILECTOMY/ADENOIDECTOMY WITH MYRINGOTOMY  remote  UPPER GI ENDOSCOPY  2013    OB History     Gravida  0   Para      Term      Preterm      AB      Living  0      SAB      IAB      Ectopic      Multiple      Live Births           Obstetric Comments  1st Menstrual Cycle: 14          Home Medications    Prior to Admission medications   Medication Sig Start Date End Date Taking? Authorizing Provider  nitrofurantoin, macrocrystal-monohydrate, (MACROBID) 100 MG capsule Take by mouth. 05/17/22 05/24/22 Yes [provider]  ondansetron (ZOFRAN) 4 MG tablet Take 1  tablet (4 mg total) by mouth every 6 (six) hours. 05/19/22  Yes Stasha Naraine, DO  aspirin EC 81 MG tablet Take 1 tablet (81 mg total) by mouth daily. 04/02/20   Bensimhon, Shaune Pascal, MD  baclofen (LIORESAL) 10 MG tablet Take 1 tablet (10 mg total) by mouth 3 (three) times daily. Patient not taking: Reported on 04/21/2022 12/30/21   Margarette Canada, NP  Calcium Carbonate (CALCIUM 600 PO) Take 600 mg by mouth daily.     [provider]  cholecalciferol (VITAMIN D) 1000 units tablet Take 1,000 Units by mouth daily.    [provider]  cyanocobalamin (VITAMIN B12) 1000 MCG/ML injection Inject 1 mL (1,000 mcg total) into the muscle every 30 (thirty) days. 12/22/21   Nicholas Lose, MD  hydrocortisone (ANUSOL-HC) 2.5 % rectal cream Place 1 Application rectally 2 (two) times daily. 03/04/22   Vladimir Crofts, PA-C  LORazepam (ATIVAN) 0.5 MG tablet Take 0.5 mg by mouth every 8 (eight) hours as needed for anxiety.     [provider]  Melatonin 5 MG TABS Take 5 mg by mouth at bedtime.     [provider]  nitrofurantoin, macrocrystal-monohydrate, (MACROBID) 100 MG capsule Take 1 capsule (100 mg total) by mouth 2 (two) times daily. Patient not taking: Reported on 04/21/2022 12/30/21   Margarette Canada, NP  omeprazole (PRILOSEC) 40 MG capsule Take 40 mg by mouth daily.    [provider]  phenazopyridine (PYRIDIUM) 200 MG tablet Take 1 tablet (200 mg total) by mouth 3 (three) times daily. Patient not taking: Reported on 04/21/2022 12/30/21   Margarette Canada, NP  rosuvastatin (CRESTOR) 20 MG tablet Take 1 tablet (20 mg total) by mouth daily. 03/03/22   Jerline Pain, MD  zolpidem (AMBIEN) 5 MG tablet Take 5 mg by mouth at bedtime as needed for sleep.  02/06/15 03/03/21  [provider]    Family History Family History  Problem Relation Age of Onset   Hypertension Mother    Thyroid disease Mother        Multi problems   Coronary artery disease Father    Heart failure  Father    Atrial fibrillation Father    Diabetes Brother    Colon cancer Neg Hx    Esophageal cancer Neg Hx    Rectal cancer Neg Hx    Stomach cancer Neg Hx     Social History Social History   Tobacco Use   Smoking status: Former    Packs/day: 1.50    Years: 20.00    Total pack years: 30.00    Types: Cigarettes    Quit date:  04/19/1998    Years since quitting: 24.0   Smokeless tobacco: Never   Tobacco comments:    quit smoking in 2000  Vaping Use   Vaping Use: Never used  Substance Use Topics   Alcohol use: No   Drug use: No     Allergies   Propoxyphene, Thioridazine hcl, Bupropion, Citalopram, Duloxetine, and Thioridazine   Review of Systems Review of Systems: negative unless otherwise stated in HPI.      Physical Exam Triage Vital Signs ED Triage Vitals  Enc Vitals Group     BP 05/19/22 1559 (!) 146/95     Pulse Rate 05/19/22 1559 (!) 121     Resp 05/19/22 1559 18     Temp 05/19/22 1559 98.1 F (36.7 C)     Temp Source 05/19/22 1559 Oral     SpO2 05/19/22 1559 96 %     Weight --      Height --      Head Circumference --      Peak Flow --      Pain Score 05/19/22 1601 0     Pain Loc --      Pain Edu? --      Excl. in [place]? --    No data found.  Updated Vital Signs BP (!) 146/95 (BP Location: Left Arm)   Pulse (!) 121   Temp 98.1 F (36.7 C) (Oral)   Resp 18   SpO2 96%   Visual Acuity Right Eye Distance:   Left Eye Distance:   Bilateral Distance:    Right Eye Near:   Left Eye Near:    Bilateral Near:     Physical Exam GEN:     alert, non-toxic appearing female in no distress    HENT:  mucus membranes moist, oropharyngeal without lesions or exudate, no tonsillar hypertrophy, mild oropharyngeal erythema,  moderate erythematous edematous turbinates, bilateral TM normal EYES:   pupils equal and reactive, no scleral injection or discharge NECK:  normal ROM, no lymphadenopathy, no meningismus   RESP:  no increased work of breathing, clear to  auscultation bilaterally CVS:   regular rhythm, tachycardic Skin:   warm and dry    UC Treatments / Results  Labs (all labs ordered are listed, but only abnormal results are displayed) Labs Reviewed  RESP PANEL BY RT-PCR (RSV, FLU A&B, COVID)  [id number]  GROUP A STREP BY PCR    EKG   Radiology No results found.  Procedures Procedures (including critical care time)  Medications Ordered in UC Medications - No data to display  Initial Impression / Assessment and Plan / UC Course  I have reviewed the triage vital signs and the nursing notes.  Pertinent labs & imaging results that were available during my care of the patient were reviewed by me and considered in my medical decision making (see chart for details).       Pt is a 62 y.o. female who presents for 3 days of respiratory symptoms. Inocencia is afebrile here without recent antipyretics. Satting well on room air. Overall pt is non-toxic appearing, well hydrated, without respiratory distress. Pulmonary exam is unremarkable.  COVID and influenza testing obtained and were negative. Strep PCR negative. Typical duration of symptoms ad symptomatic care for viral illness discussed.   Return and ED precautions given and voiced understanding. Discussed MDM, treatment plan and plan for follow-up with patient who agrees with plan.     Final Clinical Impressions(s) / UC Diagnoses   Final diagnoses:  Nausea without vomiting  Acute pharyngitis, unspecified etiology  Acute cough     Discharge Instructions      Your strep, COVID, influenza and RSV testing were all negative.  I suspect that you have a common upper respiratory infection.  I sent some nausea medicine called ondansetron/Zofran to your pharmacy.  You can use your prescription cough syrup as needed for your cough.  Continue using your throat lozenges.  Gargling with warm salt water and drinking warm tea can help with your sore throat.  Take Tylenol and/or Motrin as needed  for pain/discomfort.      ED Prescriptions     Medication Sig Dispense Auth. Provider   ondansetron (ZOFRAN) 4 MG tablet Take 1 tablet (4 mg total) by mouth every 6 (six) hours. 20 tablet Lyndee Hensen, DO      PDMP not reviewed this encounter.   Lyndee Hensen, DO 05/19/22 1715

## 2022-06-08 ENCOUNTER — Inpatient Hospital Stay

## 2022-06-17 NOTE — Progress Notes (Signed)
Patient Care Team: [name], MD as PCP - General (Internal Medicine) Jerline Pain, MD as PCP - Cardiology (Cardiology) Erroll Luna, MD as Consulting Physician (General Surgery) Dermatology, [place] Eppie Gibson, MD as Attending Physician (Radiation Oncology) Delice Bison Charlestine Massed, NP as Nurse Practitioner (Hematology and Oncology) Benjaman Kindler, MD as Consulting Physician (Obstetrics and Gynecology) [name], MD as Consulting Physician (Pulmonary Disease) Shamleffer, Melanie Crazier, MD as Consulting Physician (Endocrinology)  DIAGNOSIS: No diagnosis found.  SUMMARY OF ONCOLOGIC HISTORY: Oncology History  Malignant neoplasm of upper-inner quadrant of left breast in female, estrogen receptor positive ([facility])  04/08/2017 Initial Diagnosis   post biopsy of 2 areas in the upper outer quadrant of the left breast 04/08/2017, both showing extensive ductal carcinoma in situ low-grade, but both showing invasive ductal carcinoma (measuring 2-3 mm on the slides), grade 1, estrogen and progesterone receptor positive, HER-2 not amplified, with an MIB-1 of 5%   04/27/2017 Cancer Staging   Staging form: Breast, AJCC 8th Edition - Clinical: Stage IA (cT1a, cN0, cM0, G1, ER+, PR+, HER2-) - Signed by Chauncey Cruel, MD on 05/07/2020   04/29/2017 -  Anti-estrogen oral therapy   tamoxifen started neoadjuvantly 04/29/2017    06/14/2017 Surgery   left lumpectomy and sentinel lymph node sampling showed only residual ductal carcinoma in situ, low-grade, with negative margins.             (a) a total of 3 axillary lymph nodes were removed   06/22/2017 Cancer Staging   Staging form: Breast, AJCC 8th Edition - Pathologic: Stage 0 (pTis (DCIS), pN0, cM0) - Signed by Chauncey Cruel, MD on 05/07/2020   07/12/2017 Genetic Testing   The Common Hereditary Cancer Panel offered by [facility] includes sequencing and/or deletion duplication testing of the following 47 genes: APC, ATM,  AXIN2, BARD1, BMPR1A, BRCA1, BRCA2, BRIP1, CDH1, CDKN2A (p14ARF), CDKN2A (p16INK4a), CKD4, CHEK2, CTNNA1, DICER1, EPCAM (Deletion/duplication testing only), GREM1 (promoter region deletion/duplication testing only), KIT, MEN1, MLH1, MSH2, MSH3, MSH6, MUTYH, NBN, NF1, NHTL1, PALB2, PDGFRA, PMS2, POLD1, POLE, PTEN, RAD50, RAD51C, RAD51D, SDHB, SDHC, SDHD, SMAD4, SMARCA4. STK11, TP53, TSC1, TSC2, and VHL.  The following genes were evaluated for sequence changes only: SDHA and HOXB13 c.251G>A variant only.  Results:  Negative, No pathogenic variants identified.  The date of this test report is 07/12/2017.    07/26/2017 - 08/22/2017 Radiation Therapy   Site/dose:   40.05 Gy directed to the left breast in 15 fractions followed by a boost of 10 Gy in 5 fractions     CHIEF COMPLIANT:  Follow-up of estrogen receptor positive breast cancer observation   INTERVAL HISTORY: Courtney Romero is a 62 y.o. with above-mentioned history of estrogen receptor positive breast cancer. She presents to the clinic for a follow-up.    ALLERGIES:  is allergic to propoxyphene, thioridazine hcl, bupropion, citalopram, duloxetine, and thioridazine.  MEDICATIONS:  Current Outpatient Medications  Medication Sig Dispense Refill   aspirin EC 81 MG tablet Take 1 tablet (81 mg total) by mouth daily. 90 tablet 3   baclofen (LIORESAL) 10 MG tablet Take 1 tablet (10 mg total) by mouth 3 (three) times daily. (Patient not taking: Reported on 04/21/2022) 30 each 0   Calcium Carbonate (CALCIUM 600 PO) Take 600 mg by mouth daily.      cholecalciferol (VITAMIN D) 1000 units tablet Take 1,000 Units by mouth daily.     cyanocobalamin (VITAMIN B12) 1000 MCG/ML injection Inject 1 mL (1,000 mcg total) into the muscle every  30 (thirty) days. 10 mL 3   hydrocortisone (ANUSOL-HC) 2.5 % rectal cream Place 1 Application rectally 2 (two) times daily. 30 g 2   LORazepam (ATIVAN) 0.5 MG tablet Take 0.5 mg by mouth every 8 (eight) hours as needed for  anxiety.      Melatonin 5 MG TABS Take 5 mg by mouth at bedtime.      nitrofurantoin, macrocrystal-monohydrate, (MACROBID) 100 MG capsule Take 1 capsule (100 mg total) by mouth 2 (two) times daily. (Patient not taking: Reported on 04/21/2022) 10 capsule 0   omeprazole (PRILOSEC) 40 MG capsule Take 40 mg by mouth daily.     ondansetron (ZOFRAN) 4 MG tablet Take 1 tablet (4 mg total) by mouth every 6 (six) hours. 20 tablet 0   phenazopyridine (PYRIDIUM) 200 MG tablet Take 1 tablet (200 mg total) by mouth 3 (three) times daily. (Patient not taking: Reported on 04/21/2022) 6 tablet 0   rosuvastatin (CRESTOR) 20 MG tablet Take 1 tablet (20 mg total) by mouth daily. 90 tablet 3   zolpidem (AMBIEN) 5 MG tablet Take 5 mg by mouth at bedtime as needed for sleep.      No current facility-administered medications for this visit.    PHYSICAL EXAMINATION: ECOG PERFORMANCE STATUS: {CHL ONC ECOG PS:[id number]}  There were no vitals filed for this visit. There were no vitals filed for this visit.  BREAST:*** No palpable masses or nodules in either right or left breasts. No palpable axillary supraclavicular or infraclavicular adenopathy no breast tenderness or nipple discharge. (exam performed in the presence of a chaperone)  LABORATORY DATA:  I have reviewed the data as listed    Latest Ref Rng & Units 12/22/2021   11:00 AM 09/01/2021   11:49 AM 05/05/2021   10:51 AM  CMP  Glucose 70 - 99 mg/dL 135  114  114   BUN 6 - 20 mg/dL '13  10  10   '$ Creatinine 0.44 - 1.00 mg/dL 0.79  0.90  0.79   Sodium 135 - 145 mmol/L 144  143  142   Potassium 3.5 - 5.1 mmol/L 3.8  3.4  3.9   Chloride 98 - 111 mmol/L 108  110  108   CO2 22 - 32 mmol/L '29  27  27   '$ Calcium 8.9 - 10.3 mg/dL 9.9  9.0  9.0   Total Protein 6.5 - 8.1 g/dL 7.7  7.1  6.7   Total Bilirubin 0.3 - 1.2 mg/dL 0.7  0.6  0.6   Alkaline Phos 38 - 126 U/L 72  61  43   AST 15 - 41 U/L '18  18  18   '$ ALT 0 - 44 U/L '16  17  19     '$ Lab Results  Component Value  Date   WBC 3.9 (L) 12/22/2021   HGB 14.2 12/22/2021   HCT 41.0 12/22/2021   MCV 90.5 12/22/2021   PLT 158 12/22/2021   NEUTROABS 2.2 12/22/2021    ASSESSMENT & PLAN:  No problem-specific Assessment & Plan notes found for this encounter.    No orders of the defined types were placed in this encounter.  The patient has a good understanding of the overall plan. she agrees with it. she will call with any problems that may develop before the next visit here. Total time spent: 30 mins including face to face time and time spent for planning, charting and co-ordination of care   Courtney Romero, [place] 06/17/22    I Larene Ascencio, [employer]  am acting as a Education administrator for Dr. [employer]  ***

## 2022-06-18 ENCOUNTER — Encounter: Payer: Self-pay | Admitting: Gastroenterology

## 2022-06-18 DIAGNOSIS — R1013 Epigastric pain: Secondary | ICD-10-CM

## 2022-06-18 NOTE — Telephone Encounter (Signed)
See pathology results - Dr. Havery Moros recommended CT for ongoing pain.  CT order in epic. Secure staff message sent to radiology scheduling to contact patient to set up appt.

## 2022-06-22 ENCOUNTER — Inpatient Hospital Stay (HOSPITAL_BASED_OUTPATIENT_CLINIC_OR_DEPARTMENT_OTHER): Admitting: Hematology and Oncology

## 2022-06-22 ENCOUNTER — Inpatient Hospital Stay: Attending: Hematology and Oncology

## 2022-06-22 VITALS — BP 140/82 | HR 82 | Temp 97.9°F | Resp 18 | Ht 67.0 in | Wt 148.6 lb

## 2022-06-22 DIAGNOSIS — Z170 Estrogen receptor positive status [ER+]: Secondary | ICD-10-CM

## 2022-06-22 DIAGNOSIS — C50212 Malignant neoplasm of upper-inner quadrant of left female breast: Secondary | ICD-10-CM | POA: Insufficient documentation

## 2022-06-22 DIAGNOSIS — Z79810 Long term (current) use of selective estrogen receptor modulators (SERMs): Secondary | ICD-10-CM | POA: Diagnosis not present

## 2022-06-22 DIAGNOSIS — E538 Deficiency of other specified B group vitamins: Secondary | ICD-10-CM | POA: Diagnosis not present

## 2022-06-22 LAB — CBC WITH DIFFERENTIAL (CANCER CENTER ONLY)
Abs Immature Granulocytes: 0.01 10*3/uL (ref 0.00–0.07)
Basophils Absolute: 0 10*3/uL (ref 0.0–0.1)
Basophils Relative: 1 %
Eosinophils Absolute: 0.1 10*3/uL (ref 0.0–0.5)
Eosinophils Relative: 3 %
HCT: 39.3 % (ref 36.0–46.0)
Hemoglobin: 13.3 g/dL (ref 12.0–15.0)
Immature Granulocytes: 0 %
Lymphocytes Relative: 32 %
Lymphs Abs: 1.3 10*3/uL (ref 0.7–4.0)
MCH: 30.5 pg (ref 26.0–34.0)
MCHC: 33.8 g/dL (ref 30.0–36.0)
MCV: 90.1 fL (ref 80.0–100.0)
Monocytes Absolute: 0.3 10*3/uL (ref 0.1–1.0)
Monocytes Relative: 7 %
Neutro Abs: 2.4 10*3/uL (ref 1.7–7.7)
Neutrophils Relative %: 57 %
Platelet Count: 154 10*3/uL (ref 150–400)
RBC: 4.36 MIL/uL (ref 3.87–5.11)
RDW: 13.2 % (ref 11.5–15.5)
WBC Count: 4.2 10*3/uL (ref 4.0–10.5)
nRBC: 0 % (ref 0.0–0.2)

## 2022-06-22 LAB — CMP (CANCER CENTER ONLY)
ALT: 11 U/L (ref 0–44)
AST: 14 U/L — ABNORMAL LOW (ref 15–41)
Albumin: 4.3 g/dL (ref 3.5–5.0)
Alkaline Phosphatase: 73 U/L (ref 38–126)
Anion gap: 6 (ref 5–15)
BUN: 13 mg/dL (ref 6–20)
CO2: 27 mmol/L (ref 22–32)
Calcium: 9.5 mg/dL (ref 8.9–10.3)
Chloride: 109 mmol/L (ref 98–111)
Creatinine: 0.77 mg/dL (ref 0.44–1.00)
GFR, Estimated: >60 mL/min (ref >60)
Glucose, Bld: 92 mg/dL (ref 70–99)
Potassium: 3.7 mmol/L (ref 3.5–5.1)
Sodium: 142 mmol/L (ref 135–145)
Total Bilirubin: 0.7 mg/dL (ref 0.3–1.2)
Total Protein: 7.4 g/dL (ref 6.5–8.1)

## 2022-06-22 LAB — VITAMIN B12: Vitamin B-12: 699 pg/mL (ref 180–914)

## 2022-06-22 NOTE — Assessment & Plan Note (Addendum)
04/08/2017: Left breast cancer 2 to 3 mm, grade 1, ER/PR positive HER2 negative Ki-67 5%, extensive DCIS 06/14/2017: Left lumpectomy: No invasive cancer only DCIS, 0/3 lymph nodes Tamoxifen started 04/29/2017-07/2021   Current treatment: Observation Breast cancer surveillance: Mammogram 04/09/2022: Benign, breast density category B   B12 deficiency: Currently on B12 injections.  She is doing it at home.   Courtney Romero will continue on Lexapro that I prescribed.  Return to clinic in 6 months for follow-up and after that I can see her in 1 year

## 2022-06-24 ENCOUNTER — Telehealth: Payer: Self-pay | Admitting: Internal Medicine

## 2022-06-24 NOTE — Telephone Encounter (Signed)
Per 3/5 LOS reached out to patient to schedule. Patient aware of time and date of appointment.

## 2022-07-06 ENCOUNTER — Inpatient Hospital Stay

## 2022-07-12 ENCOUNTER — Ambulatory Visit (HOSPITAL_COMMUNITY)
Admission: RE | Admit: 2022-07-12 | Discharge: 2022-07-12 | Disposition: A | Discharge status: Home / Self Care | Source: Ambulatory Visit | Attending: Gastroenterology | Admitting: Gastroenterology

## 2022-07-12 DIAGNOSIS — R1013 Epigastric pain: Secondary | ICD-10-CM | POA: Diagnosis not present

## 2022-07-12 MED ORDER — IOHEXOL 300 MG/ML  SOLN
100.0000 mL | Freq: Once | INTRAMUSCULAR | Status: AC | PRN
  Administered 2022-07-12: 100 mL via INTRAVENOUS

## 2022-07-12 MED ORDER — SODIUM CHLORIDE (PF) 0.9 % IJ SOLN
INTRAMUSCULAR | Status: AC
  Filled 2022-07-12: qty 50

## 2022-07-14 ENCOUNTER — Encounter: Payer: Self-pay | Admitting: Gastroenterology

## 2022-07-26 ENCOUNTER — Encounter: Admitting: Gastroenterology

## 2022-08-10 ENCOUNTER — Inpatient Hospital Stay

## 2022-08-14 ENCOUNTER — Ambulatory Visit
Admission: RE | Admit: 2022-08-14 | Discharge: 2022-08-14 | Disposition: A | Discharge status: Home / Self Care | Source: Ambulatory Visit | Attending: Urgent Care | Admitting: Urgent Care

## 2022-08-14 VITALS — BP 129/87 | HR 87 | Temp 98.1°F | Resp 16

## 2022-08-14 DIAGNOSIS — N3001 Acute cystitis with hematuria: Secondary | ICD-10-CM | POA: Diagnosis not present

## 2022-08-14 LAB — POCT URINALYSIS DIP (MANUAL ENTRY)
Bilirubin, UA: NEGATIVE
Glucose, UA: NEGATIVE mg/dL
Ketones, POC UA: NEGATIVE mg/dL
Nitrite, UA: NEGATIVE
Protein Ur, POC: NEGATIVE mg/dL
Spec Grav, UA: 1.01 (ref 1.010–1.025)
Urobilinogen, UA: 0.2 E.U./dL
pH, UA: 6.5 (ref 5.0–8.0)

## 2022-08-14 MED ORDER — NITROFURANTOIN MONOHYD MACRO 100 MG PO CAPS: 100.0000 mg | ORAL_CAPSULE | Freq: Two times a day (BID) | ORAL | 0 refills | Status: DC

## 2022-08-14 NOTE — Discharge Instructions (Signed)
Follow up here or with your primary care provider if your symptoms are worsening or not improving with treatment.     

## 2022-08-14 NOTE — ED Triage Notes (Signed)
Patient presents to [facility] for UTI. States dysuria and abdominal pressure since yesterday.

## 2022-08-14 NOTE — ED Provider Notes (Signed)
Courtney Romero    CSN: [id number] Arrival date & time: 08/14/22  1050      History   Chief Complaint Chief Complaint  Patient presents with   Urinary Frequency    Think I have a uti. Frequent and burns - Entered by patient    HPI Courtney Romero is a 62 y.o. female.    Urinary Frequency    Patient presents to urgent care with urinary frequency.  She endorses dysuria as well.  Symptoms since yesterday.  Denies abdominal pain, back pain.  Past Medical History:  Diagnosis Date   Abnormal CAT scan 2013   Anxiety    Cancer [facility])    Family history of breast cancer    Family history of pancreatic cancer    Family history of prostate cancer    GERD (gastroesophageal reflux disease)    Hepatitis C 2001   Followed by Dr. Esmond Harps at [facility]   History of radiation therapy 07/26/17- 08/22/17   40.05 Gy directed to the left breast in 15 fractions followed by a boost of 10 Gy in 5 fractions.    Personal history of radiation therapy    PONV (postoperative nausea and vomiting)    Thyroid disease     Patient Active Problem List   Diagnosis Date Noted   B12 deficiency 09/08/2021   Subclinical hyperthyroidism 02/28/2020   Multinodular goiter 02/28/2020   Genetic testing 07/28/2017   GERD without esophagitis 07/05/2017   Family history of pancreatic cancer    Family history of prostate cancer    Family history of breast cancer    Aortic atherosclerosis (HCC) 04/28/2017   Malignant neoplasm of upper-inner quadrant of left breast in female, estrogen receptor positive ([facility]) 04/27/2017   Hepatitis C, chronic (HCC) 09/21/2016   Health care maintenance 07/29/2015   Menopausal disorder 01/20/2014   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   Rectal pain 05/31/2013   Hemorrhoids 05/31/2013   Rectal fissure 11/20/2012   Small intestinal bacterial overgrowth 10/11/2012   Bloating 10/03/2012   Nausea 10/03/2012   Anal pain 05/09/2012   Anxiety and depression 04/04/2012   Diverticulosis  03/21/2012   SHORTNESS OF BREATH 01/08/2009    Past Surgical History:  Procedure Laterality Date   ABDOMINAL HYSTERECTOMY  2009   breast cyst removal  1995   BREAST LUMPECTOMY Left    BREAST LUMPECTOMY WITH RADIOACTIVE SEED AND SENTINEL LYMPH NODE BIOPSY Left 06/14/2017   Procedure: LEFT BREAST SEED LOCALIZED LUMPECTOMY (2 SEEDS) AND SENTINEL LYMPH NODE BIOPSY;  Surgeon: Harriette Bouillon, MD;  Location: [facility];  Service: General;  Laterality: Left;   COLONOSCOPY  2011   COLONOSCOPY WITH PROPOFOL N/A 12/12/2017   Procedure: COLONOSCOPY WITH PROPOFOL;  Surgeon: Wyline Mood, MD;  Location: [facility] ENDOSCOPY;  Service: Gastroenterology;  Laterality: N/A;   ESOPHAGOGASTRODUODENOSCOPY (EGD) WITH PROPOFOL N/A 12/12/2017   Procedure: ESOPHAGOGASTRODUODENOSCOPY (EGD) WITH PROPOFOL;  Surgeon: Wyline Mood, MD;  Location: [facility] ENDOSCOPY;  Service: Gastroenterology;  Laterality: N/A;   FLEXIBLE SIGMOIDOSCOPY  February 12, 2013   have normal perianal exam, question focal prolapse. 2 small scars in the rectum.   HEMORRHOID BANDING  2012   3 times over three months   LEFT HEART CATH AND CORONARY ANGIOGRAPHY N/A 08/27/2019   Procedure: LEFT HEART CATH AND CORONARY ANGIOGRAPHY;  Surgeon: Dolores Patty, MD;  Location: [facility] INVASIVE CV LAB;  Service: Cardiovascular;  Laterality: N/A;   SIGMOIDOSCOPY  2013   TONSILECTOMY/ADENOIDECTOMY WITH MYRINGOTOMY  remote   UPPER GI ENDOSCOPY  2013    OB History     Gravida  0   Para      Term      Preterm      AB      Living  0      SAB      IAB      Ectopic      Multiple      Live Births           Obstetric Comments  1st Menstrual Cycle: 14          Home Medications    Prior to Admission medications   Medication Sig Start Date End Date Taking? Authorizing Provider  aspirin EC 81 MG tablet Take 1 tablet (81 mg total) by mouth daily. 04/02/20   Bensimhon, Bevelyn Buckles, MD  Calcium Carbonate (CALCIUM 600 PO) Take 600 mg by  mouth daily.     [provider]  cefUROXime (CEFTIN) 500 MG tablet Take 500 mg by mouth 2 (two) times daily with a meal.    [provider]  cholecalciferol (VITAMIN D) 1000 units tablet Take 1,000 Units by mouth daily.    [provider]  cyanocobalamin (VITAMIN B12) 1000 MCG/ML injection Inject 1 mL (1,000 mcg total) into the muscle every 30 (thirty) days. 12/22/21   Serena Croissant, MD  hydrocortisone (ANUSOL-HC) 2.5 % rectal cream Place 1 Application rectally 2 (two) times daily. 03/04/22   Doree Albee, PA-C  LORazepam (ATIVAN) 0.5 MG tablet Take 0.5 mg by mouth every 8 (eight) hours as needed for anxiety.     [provider]  Melatonin 5 MG TABS Take 5 mg by mouth at bedtime.     [provider]  omeprazole (PRILOSEC) 40 MG capsule Take 40 mg by mouth daily.    [provider]  rosuvastatin (CRESTOR) 20 MG tablet Take 1 tablet (20 mg total) by mouth daily. 03/03/22   Jake Bathe, MD  zolpidem (AMBIEN) 5 MG tablet Take 5 mg by mouth at bedtime as needed for sleep.  02/06/15 03/03/21  [provider]    Family History Family History  Problem Relation Age of Onset   Hypertension Mother    Thyroid disease Mother        Multi problems   Coronary artery disease Father    Heart failure Father    Atrial fibrillation Father    Diabetes Brother    Colon cancer Neg Hx    Esophageal cancer Neg Hx    Rectal cancer Neg Hx    Stomach cancer Neg Hx     Social History Social History   Tobacco Use   Smoking status: Former    Packs/day: 1.50    Years: 20.00    Additional pack years: 0.00    Total pack years: 30.00    Types: Cigarettes    Quit date: 04/19/1998    Years since quitting: 24.3   Smokeless tobacco: Never   Tobacco comments:    quit smoking in 2000  Vaping Use   Vaping Use: Never used  Substance Use Topics   Alcohol use: No   Drug use: No     Allergies   Propoxyphene, Thioridazine hcl, Bupropion,  Citalopram, Duloxetine, and Thioridazine   Review of Systems Review of Systems  Genitourinary:  Positive for frequency.     Physical Exam Triage Vital Signs ED Triage Vitals  Enc Vitals Group     BP 08/14/22 1114 129/87  Pulse Rate 08/14/22 1114 87     Resp 08/14/22 1114 16     Temp 08/14/22 1114 98.1 F (36.7 C)     Temp Source 08/14/22 1114 Oral     SpO2 08/14/22 1114 98 %     Weight --      Height --      Head Circumference --      Peak Flow --      Pain Score 08/14/22 1115 0     Pain Loc --      Pain Edu? --      Excl. in GC? --    No data found.  Updated Vital Signs BP 129/87 (BP Location: Left Arm)   Pulse 87   Temp 98.1 F (36.7 C) (Oral)   Resp 16   SpO2 98%   Visual Acuity Right Eye Distance:   Left Eye Distance:   Bilateral Distance:    Right Eye Near:   Left Eye Near:    Bilateral Near:     Physical Exam Vitals reviewed.  Constitutional:      Appearance: Normal appearance. She is not ill-appearing.  Skin:    General: Skin is warm and dry.  Neurological:     General: No focal deficit present.     Mental Status: She is alert and oriented to person, place, and time.  Psychiatric:        Mood and Affect: Mood normal.        Behavior: Behavior normal.      UC Treatments / Results  Labs (all labs ordered are listed, but only abnormal results are displayed) Labs Reviewed - No data to display  EKG   Radiology No results found.  Procedures Procedures (including critical care time)  Medications Ordered in UC Medications - No data to display  Initial Impression / Assessment and Plan / UC Course  I have reviewed the triage vital signs and the nursing notes.  Pertinent labs & imaging results that were available during my care of the patient were reviewed by me and considered in my medical decision making (see chart for details).   TEALE GOODGAME is a 62 y.o. female presenting with dysuria and frequency. Patient is afebrile  without recent antipyretics, satting well on room air. Overall is well appearing and non-toxic, well hydrated, without respiratory distress.  Abdomen is soft and nontender.  No CVA tenderness.  UA is strongly suggestive of urinary tract infection with large leukocytes and trace blood.  Presumed antibacterial therapy for presumed UTI.  Counseled patient on potential for adverse effects with medications prescribed/recommended today, ER and return-to-clinic precautions discussed, patient verbalized understanding and agreement with care plan.   Final Clinical Impressions(s) / UC Diagnoses   Final diagnoses:  None   Discharge Instructions   None    ED Prescriptions   None    PDMP not reviewed this encounter.   Charma Igo, [place] 08/14/22 1123

## 2022-08-25 ENCOUNTER — Ambulatory Visit: Attending: Emergency Medicine

## 2022-08-25 DIAGNOSIS — M25612 Stiffness of left shoulder, not elsewhere classified: Secondary | ICD-10-CM

## 2022-08-25 DIAGNOSIS — M25512 Pain in left shoulder: Secondary | ICD-10-CM | POA: Insufficient documentation

## 2022-08-25 NOTE — Therapy (Addendum)
OUTPATIENT PHYSICAL THERAPY SHOULDER EVALUATION   Patient Name: Courtney Romero MRN: [id number] DOB:May 19, 1960, 62 y.o., female Today's Date: 09/01/2022  END OF SESSION:   PT End of Session - 08/25/22 1355     Visit Number 1    Number of Visits 24    Date for PT Re-Evaluation 11/17/22    PT Start Time 1349    PT Stop Time 1430    PT Time Calculation (min) 41 min             Past Medical History:  Diagnosis Date   Abnormal CAT scan 2013   Anxiety    Cancer [facility])    Family history of breast cancer    Family history of pancreatic cancer    Family history of prostate cancer    GERD (gastroesophageal reflux disease)    Hepatitis C 2001   Followed by Dr. Esmond Harps at [facility]   History of radiation therapy 07/26/17- 08/22/17   40.05 Gy directed to the left breast in 15 fractions followed by a boost of 10 Gy in 5 fractions.    Personal history of radiation therapy    PONV (postoperative nausea and vomiting)    Thyroid disease    Past Surgical History:  Procedure Laterality Date   ABDOMINAL HYSTERECTOMY  2009   breast cyst removal  1995   BREAST LUMPECTOMY Left    BREAST LUMPECTOMY WITH RADIOACTIVE SEED AND SENTINEL LYMPH NODE BIOPSY Left 06/14/2017   Procedure: LEFT BREAST SEED LOCALIZED LUMPECTOMY (2 SEEDS) AND SENTINEL LYMPH NODE BIOPSY;  Surgeon: Harriette Bouillon, MD;  Location: [facility];  Service: General;  Laterality: Left;   COLONOSCOPY  2011   COLONOSCOPY WITH PROPOFOL N/A 12/12/2017   Procedure: COLONOSCOPY WITH PROPOFOL;  Surgeon: Wyline Mood, MD;  Location: [facility] ENDOSCOPY;  Service: Gastroenterology;  Laterality: N/A;   ESOPHAGOGASTRODUODENOSCOPY (EGD) WITH PROPOFOL N/A 12/12/2017   Procedure: ESOPHAGOGASTRODUODENOSCOPY (EGD) WITH PROPOFOL;  Surgeon: Wyline Mood, MD;  Location: [facility] ENDOSCOPY;  Service: Gastroenterology;  Laterality: N/A;   FLEXIBLE SIGMOIDOSCOPY  February 12, 2013   have normal perianal exam, question focal prolapse. 2 small scars in  the rectum.   HEMORRHOID BANDING  2012   3 times over three months   LEFT HEART CATH AND CORONARY ANGIOGRAPHY N/A 08/27/2019   Procedure: LEFT HEART CATH AND CORONARY ANGIOGRAPHY;  Surgeon: Dolores Patty, MD;  Location: [facility] INVASIVE CV LAB;  Service: Cardiovascular;  Laterality: N/A;   SIGMOIDOSCOPY  2013   TONSILECTOMY/ADENOIDECTOMY WITH MYRINGOTOMY  remote   UPPER GI ENDOSCOPY  2013   Patient Active Problem List   Diagnosis Date Noted   B12 deficiency 09/08/2021   Subclinical hyperthyroidism 02/28/2020   Multinodular goiter 02/28/2020   Genetic testing 07/28/2017   GERD without esophagitis 07/05/2017   Family history of pancreatic cancer    Family history of prostate cancer    Family history of breast cancer    Aortic atherosclerosis (HCC) 04/28/2017   Malignant neoplasm of upper-inner quadrant of left breast in female, estrogen receptor positive (HCC) 04/27/2017   Hepatitis C, chronic (HCC) 09/21/2016   Health care maintenance 07/29/2015   Menopausal disorder 01/20/2014   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   Rectal pain 05/31/2013   Hemorrhoids 05/31/2013   Rectal fissure 11/20/2012   Small intestinal bacterial overgrowth 10/11/2012   Bloating 10/03/2012   Nausea 10/03/2012   Anal pain 05/09/2012   Anxiety and depression 04/04/2012   Diverticulosis 03/21/2012   SHORTNESS OF BREATH 01/08/2009    PCP:  PCP - General, Internal Medicine   REFERRING PROVIDER: Orpah Melter, MD  REFERRING DIAG: [phone] (ICD-10-CM) - Pain in left shoulder   THERAPY DIAG:  Decreased ROM of left shoulder - Plan: PT plan of care cert/re-cert  Acute pain of left shoulder - Plan: PT plan of care cert/re-cert  Rationale for Evaluation and Treatment: Rehabilitation  ONSET DATE: Couple month  SUBJECTIVE:                                                                                                                                                                                      SUBJECTIVE  STATEMENT:   Pt is a 63 y.o. female seeking treatment for her L shoulder pain.  Pt is currently a hair dresser and noted it to just randomly started to hurt.  Pt notes that the only aggressive thing she does with the shoulders is golfing.  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   Hand dominance: Right  PERTINENT HISTORY: Per MD Patient presents for left shoulder pain has been ongoing for about a month. This developed gradually without inciting incident. On examination she seems to have subtle loss of external rotation but preserved abduction and forward flexion/ internal rotation. Her strength is full on examination. On ultrasound examination there is no evidence of full-thickness rotator cuff tears. She has some disease present in the supraspinatus tendon. She does have a history of radiation to the breast 5 years prior. 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 We discussed the treatment including physical therapy as the main stay of treatment for capsule stretching/rotator cuff strengthening. We also discussed potentially trialing a subacromial or glenohumeral injection for pain relief. She were to develop further stiffening we could potentially are for a large-volume injection. She will start PT and follow up PRN   PAIN:  Are you having pain? Yes: NPRS scale: 0/10 Pain location: lateral L shoulder Pain description: just pain Aggravating factors: abduction Relieving factors: not going into those movements. Worst: 8/10 with abduction Best: 0/10  PRECAUTIONS: Other: History of breast cancer of the L breast.  WEIGHT BEARING RESTRICTIONS: No  FALLS:  Has patient fallen in last 6 months? No  LIVING ENVIRONMENT: Lives with: lives with their spouse Lives in: House/apartment Stairs: Yes: External: 4 steps; can reach both Has following equipment at home: Grab bars  OCCUPATION: Hair  dresser  PLOF: Independent  PATIENT GOALS: Pt just wants to reduce pain  NEXT MD VISIT:   OBJECTIVE:   DIAGNOSTIC FINDINGS:  On ultrasound examination there is no evidence  of full-thickness rotator cuff tears. She has some disease present in the supraspinatus tendon.   PATIENT SURVEYS:  FOTO 73/73  COGNITION: Overall cognitive status: Within functional limits for tasks assessed     SENSATION: WFL  POSTURE: Good sitting posture  UPPER EXTREMITY ROM:   Active ROM Right eval Left eval  Shoulder flexion 172 deg 171 deg  Shoulder extension    Shoulder abduction 166 deg 92 deg  Shoulder adduction    Shoulder internal rotation    Shoulder external rotation    Elbow flexion    Elbow extension    Wrist flexion    Wrist extension    Wrist ulnar deviation    Wrist radial deviation    Wrist pronation    Wrist supination    (Blank rows = not tested)  UPPER EXTREMITY MMT:  MMT Right eval Left eval  Shoulder flexion 4+ 4-  Shoulder extension    Shoulder abduction 4 *3+  Shoulder adduction    Shoulder internal rotation    Shoulder external rotation    Middle trapezius    Lower trapezius    Elbow flexion 4+ 4  Elbow extension 4+ *3+  Wrist flexion    Wrist extension    Wrist ulnar deviation    Wrist radial deviation    Wrist pronation    Wrist supination    Grip strength (lbs)    (Blank rows = not tested)  SHOULDER SPECIAL TESTS: Impingement tests: Neer impingement test: negative, Hawkins/Kennedy impingement test: negative, Painful arc test: positive , and O' Briens test: positive  Rotator cuff assessment: Empty can test: positive , Full can test: positive , External rotation lag sign: negative, Belly press test: positive , and Infraspinatus test: positive  Biceps assessment: Speed's test: negative  JOINT MOBILITY TESTING:  Good joint mobility, however with increased pain upon returning to neutral position while doing AP glides.  PALPATION:  Pt with  significant TP's noted in infraspinatus and teres minor/major upon palpation.   TODAY'S TREATMENT: DATE: 09/01/22  EVAL Only  PATIENT EDUCATION: Education details: Pt educated on role of physical therapy and prognosis of current issues in the L shoulder.  Pt educated on expected response to exercises as well as dry needling that may be implemented at next session. Person educated: Patient Education method: Explanation Education comprehension: verbalized understanding  HOME EXERCISE PROGRAM:  Not given at this time.  ASSESSMENT:  CLINICAL IMPRESSION: Patient is a 62 y.o. female who was seen today for physical therapy evaluation and treatment for L shoulder pain.  Pt presents with physical impairments of decreased activity tolerance, decreased ROM of L shoulder abduction, increased pain in L shoulder with ER and abduction, and decreased strength in L shoulder globally as noted.  Pt will benefit from skilled therapy to address tolerance, ROM, pain, and strength impairments necessary for improvement in quality of life.  Pt. demonstrates understanding of this plan of care and agrees with this plan.   OBJECTIVE IMPAIRMENTS: decreased activity tolerance, decreased ROM, decreased strength, and pain.   ACTIVITY LIMITATIONS: carrying, lifting, and dressing  PARTICIPATION LIMITATIONS: meal prep, cleaning, laundry, driving, community activity, and occupation  PERSONAL FACTORS: Age, Fitness, Past/current experiences, Profession, Sex, Time since onset of injury/illness/exacerbation, and 1-2 comorbidities: Hx of breast cancer, anxiety/depression  are also affecting patient's functional outcome.   REHAB POTENTIAL: Good  CLINICAL DECISION MAKING: Stable/uncomplicated  EVALUATION COMPLEXITY: Low   GOALS: Goals reviewed with patient? Yes  SHORT TERM GOALS: Target date: 09/22/2022  Pt will be  independent with HEP in order to demonstrate increased ability to perform tasks related to  occupation/hobbies. Baseline:  Goal status: INITIAL   LONG TERM GOALS: Target date: 11/17/2022  Patient will demonstrate improved function as evidenced by a score of 76 on FOTO measure for full participation in activities at home and in the community. Baseline: FOTO: 73 Goal status: INITIAL  2.  Patient will improve shoulder AROM to > 140 degrees of abduction for improved ability to perform overhead activities. Baseline: 92 deg Goal status: INITIAL  3.  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 Baseline: 8/10 when performing abduction Goal status: INITIAL  PLAN:  PT FREQUENCY: 1-2x/week  PT DURATION: 12 weeks  PLANNED INTERVENTIONS: Therapeutic exercises, Therapeutic activity, Neuromuscular re-education, Balance training, Gait training, Patient/Family education, Self Care, Joint mobilization, Joint manipulation, Vestibular training, Dry Needling, Spinal manipulation, Spinal mobilization, Cryotherapy, Moist heat, Ultrasound, Parrafin, Manual therapy, and Re-evaluation  PLAN FOR NEXT SESSION: assess pt for dry needling in the infraspinatus/teres complex, along with potential for subscapularis and supraspinatus.    Nolon Bussing, PT, DPT Physical Therapist - [facility]  09/01/22, 12:50 PM

## 2022-08-31 ENCOUNTER — Ambulatory Visit

## 2022-08-31 DIAGNOSIS — M25512 Pain in left shoulder: Secondary | ICD-10-CM

## 2022-08-31 DIAGNOSIS — M25612 Stiffness of left shoulder, not elsewhere classified: Secondary | ICD-10-CM | POA: Diagnosis not present
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
## 2022-09-02 ENCOUNTER — Ambulatory Visit

## 2022-09-02 DIAGNOSIS — M25512 Pain in left shoulder: Secondary | ICD-10-CM

## 2022-09-02 DIAGNOSIS — M25612 Stiffness of left shoulder, not elsewhere classified: Secondary | ICD-10-CM | POA: Diagnosis not present

## 2022-09-02 NOTE — Therapy (Signed)
OUTPATIENT PHYSICAL THERAPY SHOULDER TREATMENT   Patient Name: Courtney Romero MRN: [id number] DOB:1960/06/26, 62 y.o., female Today's Date: 09/02/2022  END OF SESSION:  PT End of Session - 09/02/22 0951     Visit Number 3    Number of Visits 24    Date for PT Re-Evaluation 11/17/22    PT Start Time 0947    PT Stop Time 1030    PT Time Calculation (min) 43 min             Past Medical History:  Diagnosis Date   Abnormal CAT scan 2013   Anxiety    Cancer [facility])    Family history of breast cancer    Family history of pancreatic cancer    Family history of prostate cancer    GERD (gastroesophageal reflux disease)    Hepatitis C 2001   Followed by Dr. Esmond Harps at [facility]   History of radiation therapy 07/26/17- 08/22/17   40.05 Gy directed to the left breast in 15 fractions followed by a boost of 10 Gy in 5 fractions.    Personal history of radiation therapy    PONV (postoperative nausea and vomiting)    Thyroid disease    Past Surgical History:  Procedure Laterality Date   ABDOMINAL HYSTERECTOMY  2009   breast cyst removal  1995   BREAST LUMPECTOMY Left    BREAST LUMPECTOMY WITH RADIOACTIVE SEED AND SENTINEL LYMPH NODE BIOPSY Left 06/14/2017   Procedure: LEFT BREAST SEED LOCALIZED LUMPECTOMY (2 SEEDS) AND SENTINEL LYMPH NODE BIOPSY;  Surgeon: Harriette Bouillon, MD;  Location: [facility];  Service: General;  Laterality: Left;   COLONOSCOPY  2011   COLONOSCOPY WITH PROPOFOL N/A 12/12/2017   Procedure: COLONOSCOPY WITH PROPOFOL;  Surgeon: Wyline Mood, MD;  Location: [facility] ENDOSCOPY;  Service: Gastroenterology;  Laterality: N/A;   ESOPHAGOGASTRODUODENOSCOPY (EGD) WITH PROPOFOL N/A 12/12/2017   Procedure: ESOPHAGOGASTRODUODENOSCOPY (EGD) WITH PROPOFOL;  Surgeon: Wyline Mood, MD;  Location: [facility] ENDOSCOPY;  Service: Gastroenterology;  Laterality: N/A;   FLEXIBLE SIGMOIDOSCOPY  February 12, 2013   have normal perianal exam, question focal prolapse. 2 small scars in  the rectum.   HEMORRHOID BANDING  2012   3 times over three months   LEFT HEART CATH AND CORONARY ANGIOGRAPHY N/A 08/27/2019   Procedure: LEFT HEART CATH AND CORONARY ANGIOGRAPHY;  Surgeon: Dolores Patty, MD;  Location: [facility] INVASIVE [facility];  Service: Cardiovascular;  Laterality: N/A;   SIGMOIDOSCOPY  2013   TONSILECTOMY/ADENOIDECTOMY WITH MYRINGOTOMY  remote   UPPER GI ENDOSCOPY  2013   Patient Active Problem List   Diagnosis Date Noted   B12 deficiency 09/08/2021   Subclinical hyperthyroidism 02/28/2020   Multinodular goiter 02/28/2020   Genetic testing 07/28/2017   GERD without esophagitis 07/05/2017   Family history of pancreatic cancer    Family history of prostate cancer    Family history of breast cancer    Aortic atherosclerosis (HCC) 04/28/2017   Malignant neoplasm of upper-inner quadrant of left breast in female, estrogen receptor positive (HCC) 04/27/2017   Hepatitis C, chronic (HCC) 09/21/2016   Health care maintenance 07/29/2015   Menopausal disorder 01/20/2014   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   Rectal pain 05/31/2013   Hemorrhoids 05/31/2013   Rectal fissure 11/20/2012   Small intestinal bacterial overgrowth 10/11/2012   Bloating 10/03/2012   Nausea 10/03/2012   Anal pain 05/09/2012   Anxiety and depression 04/04/2012   Diverticulosis 03/21/2012   SHORTNESS OF BREATH 01/08/2009    PCP: PCP -  General, Internal Medicine   REFERRING PROVIDER: Orpah Melter, MD  REFERRING DIAG: [phone] (ICD-10-CM) - Pain in left shoulder   THERAPY DIAG:  Decreased ROM of left shoulder  Acute pain of left shoulder  Rationale for Evaluation and Treatment: Rehabilitation  ONSET DATE: Couple month  SUBJECTIVE:                                                                                                                                                                                      SUBJECTIVE STATEMENT:  Pt reports she would rather not needle around the lungs just  to be cautious due to going out of town soon.  Pt did not notice a large amount of difference following the last needling session.     Hand dominance: Right  PERTINENT HISTORY: Per MD Patient presents for left shoulder pain has been ongoing for about a month. This developed gradually without inciting incident. On examination she seems to have subtle loss of external rotation but preserved abduction and forward flexion/ internal rotation. Her strength is full on examination. On ultrasound examination there is no evidence of full-thickness rotator cuff tears. She has some disease present in the supraspinatus tendon. She does have a history of radiation to the breast 5 years prior. 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 We discussed the treatment including physical therapy as the main stay of treatment for capsule stretching/rotator cuff strengthening. We also discussed potentially trialing a subacromial or glenohumeral injection for pain relief. She were to develop further stiffening we could potentially are for a large-volume injection. She will start PT and follow up PRN   PAIN:  Are you having pain? Yes: NPRS scale: 7/10 Pain location: lateral L shoulder Pain description: just pain Aggravating factors: abduction Relieving factors: not going into those movements. Worst: 8/10 with abduction Best: 0/10  PRECAUTIONS: Other: History of breast cancer of the L breast.  WEIGHT BEARING RESTRICTIONS: No  FALLS:  Has patient fallen in last 6 months? No  LIVING ENVIRONMENT: Lives with: lives with their spouse Lives in: House/apartment Stairs: Yes: External: 4 steps; can reach both Has following equipment at home: Grab bars  OCCUPATION: Hair dresser  PLOF: Independent  PATIENT GOALS: Pt just wants to reduce pain  NEXT MD VISIT:   OBJECTIVE:   DIAGNOSTIC FINDINGS:  On ultrasound examination there is no evidence of full-thickness rotator  cuff tears. She has some disease present in the supraspinatus tendon.   PATIENT SURVEYS:  FOTO 73/73  COGNITION: Overall cognitive status: Within functional limits for tasks assessed     SENSATION: WFL  POSTURE: Good sitting posture  UPPER EXTREMITY ROM:   Active ROM Right eval Left eval  Shoulder flexion 172 deg 171 deg  Shoulder extension    Shoulder abduction 166 deg 92 deg  Shoulder adduction    Shoulder internal rotation    Shoulder external rotation    Elbow flexion    Elbow extension    Wrist flexion    Wrist extension    Wrist ulnar deviation    Wrist radial deviation    Wrist pronation    Wrist supination    (Blank rows = not tested)  UPPER EXTREMITY MMT:  MMT Right eval Left eval  Shoulder flexion 4+ 4-  Shoulder extension    Shoulder abduction 4 *3+  Shoulder adduction    Shoulder internal rotation    Shoulder external rotation    Middle trapezius    Lower trapezius    Elbow flexion 4+ 4  Elbow extension 4+ *3+  Wrist flexion    Wrist extension    Wrist ulnar deviation    Wrist radial deviation    Wrist pronation    Wrist supination    Grip strength (lbs)    (Blank rows = not tested)  SHOULDER SPECIAL TESTS: Impingement tests: Neer impingement test: negative, Hawkins/Kennedy impingement test: negative, Painful arc test: positive , and O' Briens test: positive  Rotator cuff assessment: Empty can test: positive , Full can test: positive , External rotation lag sign: negative, Belly press test: positive , and Infraspinatus test: positive  Biceps assessment: Speed's test: negative  JOINT MOBILITY TESTING:  Good joint mobility, however with increased pain upon returning to neutral position while doing AP glides.  PALPATION:  Pt with significant TP's noted in infraspinatus and teres minor/major upon palpation.   TODAY'S TREATMENT: DATE: 09/02/22  TherEx:  Seated row at [facility] 15# 2x10  Seated lat pull down at [facility] 15# 2x10 Seated chest  press at [facility], 15#, 2x10   Manual:  STM with TP release technique applied to the UT, infraspinatus, and subscapularis of the L shoulder. STM with TP release to teres major/minor for pain relief and improved pain-free mobility of the L shoulder    Trigger Point Dry Needling (TDN), unbilled, 4 min Education performed with patient regarding potential benefit of TDN. Reviewed precautions and risks with patient. Reviewed special precautions/risks over lung fields which include pneumothorax. Reviewed signs and symptoms of pneumothorax and advised pt to go to ER immediately if these symptoms develop advise them of dry needling treatment. Extensive time spent with pt to ensure full understanding of TDN risks. Pt provided verbal consent to treatment. TDN performed to teres minor/major and subscapularis with 0.3 x 30 and 0.3 x 60 respectively, single needle placements with local twitch response (LTR). Pistoning technique utilized. Improved pain-free motion following intervention.     PATIENT EDUCATION: Education details: Pt educated on role of physical therapy and prognosis of current issues in the L shoulder.  Pt educated on expected response to exercises as well as dry needling that may be implemented at next session. Person educated: Patient Education method: Explanation Education comprehension: verbalized understanding  HOME EXERCISE PROGRAM:  Access Code: [email or web address] URL: [email or web address] Date: 08/31/2022 Prepared by: Tomasa Hose  Exercises - Seated Scapular Retraction  - 1 x daily - 7 x weekly - 3 sets - 10 reps - Isometric Shoulder Flexion with Ball at Wall  - 1 x daily - 7 x weekly - 3 sets - 10 reps - Isometric Shoulder Adduction  - 1 x daily - 7 x  weekly - 3 sets - 10 reps - Isometric Shoulder Extension with Ball at [employer]  - 1 x daily - 7 x weekly - 3 sets - 10 reps - Isometric Shoulder Abduction with Ball at Wall  - 1 x daily - 7 x weekly - 3 sets - 10 reps -  Supine Shoulder External Rotation on Foam Roll with Theraband  - 1 x daily - 7 x weekly - 3 sets - 10 reps   ASSESSMENT:  CLINICAL IMPRESSION:  Pt responded much more favorably to the manual therapy and dry needling, noting an increase in ROM with abduction.  Pt now able to fully perform full ROM of abduction, still with increased pain, but is able to be performed.  Pt notes she has not been able to lift her arm past 90 deg in several weeks, and is thankful for being able to perform.  Pt advised to continue with ROM while she can and to continue with the current exercises.  Pt given shoulder abduction in order to keep ROM that has been gained.   Pt will continue to benefit from skilled therapy to address remaining deficits in order to improve overall QoL and return to PLOF.       OBJECTIVE IMPAIRMENTS: decreased activity tolerance, decreased ROM, decreased strength, and pain.   ACTIVITY LIMITATIONS: carrying, lifting, and dressing  PARTICIPATION LIMITATIONS: meal prep, cleaning, laundry, driving, community activity, and occupation  PERSONAL FACTORS: Age, Fitness, Past/current experiences, Profession, Sex, Time since onset of injury/illness/exacerbation, and 1-2 comorbidities: Hx of breast cancer, anxiety/depression  are also affecting patient's functional outcome.   REHAB POTENTIAL: Good  CLINICAL DECISION MAKING: Stable/uncomplicated  EVALUATION COMPLEXITY: Low   GOALS: Goals reviewed with patient? Yes  SHORT TERM GOALS: Target date: 09/22/2022  Pt will be independent with HEP in order to demonstrate increased ability to perform tasks related to occupation/hobbies. Baseline:  Goal status: INITIAL   LONG TERM GOALS: Target date: 11/17/2022  Patient will demonstrate improved function as evidenced by a score of 76 on FOTO measure for full participation in activities at home and in the community. Baseline: FOTO: 73 Goal status: INITIAL  2.  Patient will improve shoulder AROM to >  140 degrees of abduction for improved ability to perform overhead activities. Baseline: 92 deg Goal status: INITIAL  3.  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 Baseline: 8/10 when performing abduction Goal status: INITIAL  PLAN:  PT FREQUENCY: 1-2x/week  PT DURATION: 12 weeks  PLANNED INTERVENTIONS: Therapeutic exercises, Therapeutic activity, Neuromuscular re-education, Balance training, Gait training, Patient/Family education, Self Care, Joint mobilization, Joint manipulation, Vestibular training, Dry Needling, Spinal manipulation, Spinal mobilization, Cryotherapy, Moist heat, Ultrasound, Parrafin, Manual therapy, and Re-evaluation  PLAN FOR NEXT SESSION: assess pt for dry needling in the infraspinatus/teres complex, along with potential for subscapularis and supraspinatus.    Nolon Bussing, PT, DPT Physical Therapist - [facility]  09/02/22, 3:34 PM

## 2022-09-06 ENCOUNTER — Ambulatory Visit

## 2022-09-07 ENCOUNTER — Inpatient Hospital Stay

## 2022-09-08 ENCOUNTER — Encounter

## 2022-09-14 ENCOUNTER — Ambulatory Visit (INDEPENDENT_AMBULATORY_CARE_PROVIDER_SITE_OTHER): Admitting: Gastroenterology

## 2022-09-14 ENCOUNTER — Encounter: Payer: Self-pay | Admitting: Gastroenterology

## 2022-09-14 VITALS — BP 124/80 | HR 84 | Ht 67.0 in | Wt 147.5 lb

## 2022-09-14 DIAGNOSIS — K641 Second degree hemorrhoids: Secondary | ICD-10-CM

## 2022-09-14 MED ORDER — AMBULATORY NON FORMULARY MEDICATION: 0 refills | Status: DC

## 2022-09-14 NOTE — Patient Instructions (Addendum)
If your blood pressure at your visit was 140/90 or greater, please contact your primary care physician to follow up on this. ______________________________________________________  If you are age 61 or older, your body mass index should be between 23-30. Your Body mass index is 23.1 kg/m. If this is out of the aforementioned range listed, please consider follow up with your Primary Care Provider.  If you are age 42 or younger, your body mass index should be between 19-25. Your Body mass index is 23.1 kg/m. If this is out of the aformentioned range listed, please consider follow up with your Primary Care Provider.  ________________________________________________________  The [facility] providers would like to encourage you to use [facility] to communicate with providers for non-urgent requests or questions.  Due to long hold times on the telephone, sending your provider a message by [facility] may be a faster and more efficient way to get a response.  Please allow 48 business hours for a response.  Please remember that this is for non-urgent requests.  _______________________________________________________  Due to recent changes in healthcare laws, you may see the results of your imaging and laboratory studies on MyChart before your provider has had a chance to review them.  We understand that in some cases there may be results that are confusing or concerning to you. Not all laboratory results come back in the same time frame and the provider may be waiting for multiple results in order to interpret others.  Please give [place] 48 hours in order for your provider to thoroughly review all the results before contacting the office for clarification of your results.   HEMORRHOID BANDING PROCEDURE    FOLLOW-UP CARE   The procedure you have had should have been relatively painless since the banding of the area involved does not have nerve endings and there is no pain sensation.  The rubber band cuts off the  blood supply to the hemorrhoid and the band may fall off as soon as 48 hours after the banding (the band may occasionally be seen in the toilet bowl following a bowel movement). You may notice a temporary feeling of fullness in the rectum which should respond adequately to plain Tylenol or Motrin.  Following the banding, avoid strenuous exercise that evening and resume full activity the next day.  A sitz bath (soaking in a warm tub) or bidet is soothing, and can be useful for cleansing the area after bowel movements.     To avoid constipation, take two tablespoons of natural wheat bran, natural oat bran, flax, Benefiber or any over the counter fiber supplement and increase your water intake to 7-8 glasses daily.    Unless you have been prescribed anorectal medication, do not put anything inside your rectum for two weeks: No suppositories, enemas, fingers, etc.  Occasionally, you may have more bleeding than usual after the banding procedure.  This is often from the untreated hemorrhoids rather than the treated one.  Don't be concerned if there is a tablespoon or so of blood.  If there is more blood than this, lie flat with your bottom higher than your head and apply an ice pack to the area. If the bleeding does not stop within a half an hour or if you feel faint, call our office at [phone] or go to the emergency room.  Problems are not common; however, if there is a substantial amount of bleeding, severe pain, chills, fever or difficulty passing urine (very rare) or other problems, you should  call us at [phone] or report to the nearest emergency room.  Do not stay seated continuously for more than 2-3 hours for a day or two after the procedure.  Tighten your buttock muscles 10-15 times every two hours and take 10-15 deep breaths every 1-2 hours.  Do not spend more than a few minutes on the toilet if you cannot empty your bowel; instead re-visit the toilet at a later  time.  _____________________________________________________________________________________________  We have sent a prescription for nitroglycerin 0.125% gel to [facility]. You should apply a pea size amount to your rectum every 8 hours as needed  [facility] information is below: Address: [address], [place], [place] [address]  [phone]  *Please DO NOT go directly from our office to pick up this medication! Give the pharmacy 1 day to process the prescription as this is compounded and takes time to make.  _______________________________________________________________________________________________  Courtney Romero have been scheduled for a 2nd hemorrhoid banding appointment on Thursday, 11-11-22 at 11:00 am. Please arrive 10 minutes early for registration. If you need to reschedule or cancel this appointment please call [phone].    Thank you for entrusting me with your care and for choosing [facility], Dr. Ileene Patrick

## 2022-09-14 NOTE — Progress Notes (Signed)
62 year old female here for follow-up visit for hemorrhoid banding.  She has had ongoing intermittent hemorrhoid symptoms for some time.  She has discomfort, irritation, prolapse that appears to be her main symptoms.  She states she had banding several years ago which provided benefit but symptoms have since come back.  She has occasional constipated or loose stool, can vary.  Has not taken any fiber recently.  Has been on MiraLAX at times.  We discussed options and she wanted to proceed with hemorrhoid banding.  Colonoscopy up-to-date as below, no other cause for her symptoms noted.    EGD 04/21/22: - Esophagogastric landmarks were identified: the Z-line was found at 40 cm, the gastroesophageal junction was found at 40 cm and the upper extent of the gastric folds was found at 41 cm from the incisors. Findings: - A 1 cm hiatal hernia was present. - The exam of the esophagus was otherwise normal. - Multiple benign small sessile polyps were found in the entire examined stomach, grossly consistent with benign fundic gland polyps. Biopsies were taken with a cold forceps for histology as a representative sample. - Patchy mildly erythematous mucosa was found in the gastric fundus. - The exam of the stomach was otherwise normal. - Biopsies were taken with a cold forceps in the gastric body, at the incisura and in the gastric antrum for Helicobacter pylori testing. - The examined duodenum was normal.  Colonoscopy 04/21/22: - Skin tags were found on perianal exam. - The terminal ileum appeared normal. - A 3 mm polyp was found in the ascending colon. The polyp was sessile. The polyp was removed with a cold snare. Resection and retrieval were complete. - A 4 mm polyp was found in the sigmoid colon. The polyp was sessile. The polyp was removed with a cold snare. Resection and retrieval were complete. - A few small-mouthed diverticula were found in the sigmoid colon. - Internal hemorrhoids were found during  retroflexion. The hemorrhoids were moderate. - The exam was otherwise without abnormality.  1. Surgical [P], gastric polyps - FUNDIC GLAND POLYP(S) - NEGATIVE FOR HELICOBACTER ORGANISMS ON H&E STAIN 2. Surgical [P], gastric antrum and gastric body - ANTRAL AND OXYNTIC MUCOSAE WITH REACTIVE (CHEMICAL) GASTROPATHY - OXYNTIC MUCOSA WITH EARLY FUNDIC GLAND POLYP LIKE CHANGES - IMMUNOHISTOCHEMICAL STAIN FOR HELICOBACTER ORGANISMS IS NEGATIVE 3. Surgical [P], colon, ascending, polyp (1) - TUBULAR ADENOMA - NEGATIVE FOR HIGH-GRADE DYSPLASIA OR MALIGNANCY 4. Surgical [P], colon, sigmoid, polyp (1) - HYPERPLASTIC POLYP   PROCEDURE NOTE: The patient presents with symptomatic grade II  hemorrhoids, requesting rubber band ligation of his/her hemorrhoidal disease.  All risks, benefits and alternative forms of therapy were described and informed consent was obtained.  The anorectum was pre-medicated with 0.125% nitroglycerin The decision was made to band the RP internal hemorrhoid, and the [facility] was used to perform band ligation without complication.  Digital anorectal examination was then performed to assure proper positioning of the band, and to adjust the banded tissue as required.  The patient was discharged home without pain or other issues.  Dietary and behavioral recommendations were given and along with follow-up instructions.     The following adjunctive treatments were recommended: Start Citrucel once daily  The patient will return in 2-4 weeks for  follow-up and possible additional banding as required. No complications were encountered and the patient tolerated the procedure well.  She had some mild sensitivity prior to band placement.  Provided her some topical nitroglycerin ointment to use  as needed for spasm or discomfort post banding if needed.  Order to get to the pharmacy.  Harlin Rain, MD [facility]

## 2022-09-15 ENCOUNTER — Ambulatory Visit

## 2022-09-15 DIAGNOSIS — M25612 Stiffness of left shoulder, not elsewhere classified: Secondary | ICD-10-CM | POA: Diagnosis not present

## 2022-09-15 DIAGNOSIS — M25512 Pain in left shoulder: Secondary | ICD-10-CM

## 2022-09-15 NOTE — Therapy (Signed)
OUTPATIENT PHYSICAL THERAPY SHOULDER TREATMENT   Patient Name: Courtney Romero MRN: [id number] DOB:01/23/1961, 62 y.o., female Today's Date: 09/15/2022  END OF SESSION:  PT End of Session - 09/15/22 1433     Visit Number 4    Number of Visits 24    Date for PT Re-Evaluation 11/17/22    Progress Note Due on Visit 10    PT Start Time 1433    PT Stop Time 1515    PT Time Calculation (min) 42 min             Past Medical History:  Diagnosis Date   Abnormal CAT scan 2013   Anxiety    Cancer [facility])    Family history of breast cancer    Family history of pancreatic cancer    Family history of prostate cancer    GERD (gastroesophageal reflux disease)    Hepatitis C 2001   Followed by Dr. Esmond Harps at [facility]   History of radiation therapy 07/26/17- 08/22/17   40.05 Gy directed to the left breast in 15 fractions followed by a boost of 10 Gy in 5 fractions.    Personal history of radiation therapy    PONV (postoperative nausea and vomiting)    Thyroid disease    Past Surgical History:  Procedure Laterality Date   ABDOMINAL HYSTERECTOMY  2009   breast cyst removal  1995   BREAST LUMPECTOMY Left    BREAST LUMPECTOMY WITH RADIOACTIVE SEED AND SENTINEL LYMPH NODE BIOPSY Left 06/14/2017   Procedure: LEFT BREAST SEED LOCALIZED LUMPECTOMY (2 SEEDS) AND SENTINEL LYMPH NODE BIOPSY;  Surgeon: Harriette Bouillon, MD;  Location: [facility];  Service: General;  Laterality: Left;   COLONOSCOPY  2011   COLONOSCOPY WITH PROPOFOL N/A 12/12/2017   Procedure: COLONOSCOPY WITH PROPOFOL;  Surgeon: Wyline Mood, MD;  Location: [facility] ENDOSCOPY;  Service: Gastroenterology;  Laterality: N/A;   ESOPHAGOGASTRODUODENOSCOPY (EGD) WITH PROPOFOL N/A 12/12/2017   Procedure: ESOPHAGOGASTRODUODENOSCOPY (EGD) WITH PROPOFOL;  Surgeon: Wyline Mood, MD;  Location: [facility] ENDOSCOPY;  Service: Gastroenterology;  Laterality: N/A;   FLEXIBLE SIGMOIDOSCOPY  February 12, 2013   have normal perianal exam, question  focal prolapse. 2 small scars in the rectum.   HEMORRHOID BANDING  2012   3 times over three months   LEFT HEART CATH AND CORONARY ANGIOGRAPHY N/A 08/27/2019   Procedure: LEFT HEART CATH AND CORONARY ANGIOGRAPHY;  Surgeon: Dolores Patty, MD;  Location: [facility] INVASIVE CV LAB;  Service: Cardiovascular;  Laterality: N/A;   SIGMOIDOSCOPY  2013   TONSILECTOMY/ADENOIDECTOMY WITH MYRINGOTOMY  remote   UPPER GI ENDOSCOPY  2013   Patient Active Problem List   Diagnosis Date Noted   B12 deficiency 09/08/2021   Subclinical hyperthyroidism 02/28/2020   Multinodular goiter 02/28/2020   Genetic testing 07/28/2017   GERD without esophagitis 07/05/2017   Family history of pancreatic cancer    Family history of prostate cancer    Family history of breast cancer    Aortic atherosclerosis (HCC) 04/28/2017   Malignant neoplasm of upper-inner quadrant of left breast in female, estrogen receptor positive (HCC) 04/27/2017   Hepatitis C, chronic (HCC) 09/21/2016   Health care maintenance 07/29/2015   Menopausal disorder 01/20/2014   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   Rectal pain 05/31/2013   Hemorrhoids 05/31/2013   Rectal fissure 11/20/2012   Small intestinal bacterial overgrowth 10/11/2012   Bloating 10/03/2012   Nausea 10/03/2012   Anal pain 05/09/2012   Anxiety and depression 04/04/2012   Diverticulosis 03/21/2012  SHORTNESS OF BREATH 01/08/2009    PCP: PCP - General, Internal Medicine   REFERRING PROVIDER: Orpah Melter, MD  REFERRING DIAG: [phone] (ICD-10-CM) - Pain in left shoulder   THERAPY DIAG: Decreased ROM of left shoulder  Acute pain of left shoulder  Rationale for Evaluation and Treatment: Rehabilitation  ONSET DATE: Couple month  SUBJECTIVE:                                                                                                                                                                                      SUBJECTIVE STATEMENT:  Pt reports she is still  around the same as she was last time.  Pt notes that it is bothering her when going into abduction.  Pt reports her trip was good.   Hand dominance: Right  PERTINENT HISTORY:  Per MD  Patient presents for left shoulder pain has been ongoing for about a month. This developed gradually without inciting incident. On examination she seems to have subtle loss of external rotation but preserved abduction and forward flexion/ internal rotation. Her strength is full on examination. On ultrasound examination there is no evidence of full-thickness rotator cuff tears. She has some disease present in the supraspinatus tendon. She does have a history of radiation to the breast 5 years prior. 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 We discussed the treatment including physical therapy as the main stay of treatment for capsule stretching/rotator cuff strengthening. We also discussed potentially trialing a subacromial or glenohumeral injection for pain relief. She were to develop further stiffening we could potentially are for a large-volume injection. She will start PT and follow up PRN   PAIN:  Are you having pain? Yes: NPRS scale: 7/10 Pain location: lateral L shoulder Pain description: just pain Aggravating factors: abduction Relieving factors: not going into those movements. Worst: 8/10 with abduction Best: 0/10  PRECAUTIONS: Other: History of breast cancer of the L breast.  WEIGHT BEARING RESTRICTIONS: No  FALLS:  Has patient fallen in last 6 months? No  LIVING ENVIRONMENT: Lives with: lives with their spouse Lives in: House/apartment Stairs: Yes: External: 4 steps; can reach both Has following equipment at home: Grab bars  OCCUPATION: Hair dresser  PLOF: Independent  PATIENT GOALS: Pt just wants to reduce pain  NEXT MD VISIT:   OBJECTIVE:   DIAGNOSTIC FINDINGS:  On ultrasound examination there is no evidence of full-thickness rotator  cuff tears. She has some disease present in the supraspinatus tendon.   PATIENT SURVEYS:  FOTO 73/73  COGNITION: Overall cognitive status: Within functional limits for tasks assessed     SENSATION: [facility]  POSTURE: Good sitting posture  UPPER EXTREMITY ROM:  Active ROM Right eval Left eval  Shoulder flexion 172 deg 171 deg  Shoulder extension    Shoulder abduction 166 deg 92 deg  Shoulder adduction    Shoulder internal rotation    Shoulder external rotation    Elbow flexion    Elbow extension    Wrist flexion    Wrist extension    Wrist ulnar deviation    Wrist radial deviation    Wrist pronation    Wrist supination    (Blank rows = not tested)  UPPER EXTREMITY MMT:  MMT Right eval Left eval  Shoulder flexion 4+ 4-  Shoulder extension    Shoulder abduction 4 *3+  Shoulder adduction    Shoulder internal rotation    Shoulder external rotation    Middle trapezius    Lower trapezius    Elbow flexion 4+ 4  Elbow extension 4+ *3+  Wrist flexion    Wrist extension    Wrist ulnar deviation    Wrist radial deviation    Wrist pronation    Wrist supination    Grip strength (lbs)    (Blank rows = not tested)  SHOULDER SPECIAL TESTS: Impingement tests: Neer impingement test: negative, Hawkins/Kennedy impingement test: negative, Painful arc test: positive , and O' Briens test: positive  Rotator cuff assessment: Empty can test: positive , Full can test: positive , External rotation lag sign: negative, Belly press test: positive , and Infraspinatus test: positive  Biceps assessment: Speed's test: negative  JOINT MOBILITY TESTING: Good joint mobility, however with increased pain upon returning to neutral position while doing AP glides.  PALPATION: Pt with significant TP's noted in infraspinatus and teres minor/major upon palpation.   TODAY'S TREATMENT: DATE: 09/15/22  TherEx:  Seated row at [facility] 25# 2x10  Seated lat pull down at [facility] 25# 2x10 Seated chest  press at [employer], 25#, x10; 20#, x10 Briefcase carries, 10# KB, x3 laps Jimmye Norman carries, 15# DB's, x3 laps Standing shoulder shrugs, 15#, 2x10 Sidelying ER with 3# DB, 2x10 Standing scaption with 3# DB's, 2x10 Standing bodyblade into forward flexion and abduction, 30 sec bouts Standing wall angels, with increased pain, but able to perform x10 Supine serratus punches with RedTB resistance, 2x10    Manual:  Seated STM with TP release technique applied to the UT, infraspinatus, and subscapularis of the L shoulder. STM with TP release to teres major/minor for pain relief and improved pain-free mobility of the L shoulder     PATIENT EDUCATION: Education details: Pt educated on role of physical therapy and prognosis of current issues in the L shoulder.  Pt educated on expected response to exercises as well as dry needling that may be implemented at next session. Person educated: Patient Education method: Explanation Education comprehension: verbalized understanding  HOME EXERCISE PROGRAM:  Access Code: [email or web address] URL: [email or web address] Date: 08/31/2022 Prepared by: Tomasa Hose  Exercises - Seated Scapular Retraction  - 1 x daily - 7 x weekly - 3 sets - 10 reps - Isometric Shoulder Flexion with Ball at Wall  - 1 x daily - 7 x weekly - 3 sets - 10 reps - Isometric Shoulder Adduction  - 1 x daily - 7 x weekly - 3 sets - 10 reps - Isometric Shoulder Extension with Ball at Wall  - 1 x daily - 7 x weekly - 3 sets - 10 reps - Isometric Shoulder Abduction with Ball at Wall  - 1 x daily - 7 x  weekly - 3 sets - 10 reps - Supine Shoulder External Rotation on Foam Roll with Theraband  - 1 x daily - 7 x weekly - 3 sets - 10 reps   ASSESSMENT:  CLINICAL IMPRESSION:  Pt responded well to the increased exercises and still demonstrates signs and symptoms of L shoulder impingement.  Pt continues to be able to move the L shoulder into full ROM however is still painful throughout  the painful arc portion of the test.  Pt to continue with exercises and HEP in order to improve overall ROM and improve strength of the UE's.   Pt will continue to benefit from skilled therapy to address remaining deficits in order to improve overall QoL and return to PLOF.        OBJECTIVE IMPAIRMENTS: decreased activity tolerance, decreased ROM, decreased strength, and pain.   ACTIVITY LIMITATIONS: carrying, lifting, and dressing  PARTICIPATION LIMITATIONS: meal prep, cleaning, laundry, driving, community activity, and occupation  PERSONAL FACTORS: Age, Fitness, Past/current experiences, Profession, Sex, Time since onset of injury/illness/exacerbation, and 1-2 comorbidities: Hx of breast cancer, anxiety/depression  are also affecting patient's functional outcome.   REHAB POTENTIAL: Good  CLINICAL DECISION MAKING: Stable/uncomplicated  EVALUATION COMPLEXITY: Low   GOALS: Goals reviewed with patient? Yes  SHORT TERM GOALS: Target date: 09/22/2022  Pt will be independent with HEP in order to demonstrate increased ability to perform tasks related to occupation/hobbies. Baseline:  Goal status: INITIAL   LONG TERM GOALS: Target date: 11/17/2022  Patient will demonstrate improved function as evidenced by a score of 76 on FOTO measure for full participation in activities at home and in the community. Baseline: FOTO: 73 Goal status: INITIAL  2.  Patient will improve shoulder AROM to > 140 degrees of abduction for improved ability to perform overhead activities. Baseline: 92 deg Goal status: INITIAL  3.  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 Baseline: 8/10 when performing abduction Goal status: INITIAL  PLAN:  PT FREQUENCY: 1-2x/week  PT DURATION: 12 weeks  PLANNED INTERVENTIONS: Therapeutic exercises, Therapeutic activity, Neuromuscular re-education, Balance training,  Gait training, Patient/Family education, Self Care, Joint mobilization, Joint manipulation, Vestibular training, Dry Needling, Spinal manipulation, Spinal mobilization, Cryotherapy, Moist heat, Ultrasound, Parrafin, Manual therapy, and Re-evaluation  PLAN FOR NEXT SESSION: assess pt for dry needling in the infraspinatus/teres complex, along with potential for subscapularis and supraspinatus.    Nolon Bussing, PT, DPT Physical Therapist - [facility]  09/15/22, 4:02 PM

## 2022-09-16 ENCOUNTER — Encounter

## 2022-09-21 ENCOUNTER — Ambulatory Visit

## 2022-09-23 ENCOUNTER — Ambulatory Visit: Attending: Emergency Medicine

## 2022-09-23 DIAGNOSIS — M25612 Stiffness of left shoulder, not elsewhere classified: Secondary | ICD-10-CM | POA: Diagnosis present

## 2022-09-23 DIAGNOSIS — M25512 Pain in left shoulder: Secondary | ICD-10-CM | POA: Diagnosis present

## 2022-09-23 NOTE — Therapy (Signed)
OUTPATIENT PHYSICAL THERAPY SHOULDER TREATMENT   Patient Name: Courtney Romero MRN: [id number] DOB:11-14-1960, 62 y.o., female Today's Date: 09/23/2022  END OF SESSION:  PT End of Session - 09/23/22 1024     Visit Number 5    Number of Visits 24    Date for PT Re-Evaluation 11/17/22    Progress Note Due on Visit 10    PT Start Time 1030    PT Stop Time 1114    PT Time Calculation (min) 44 min    Activity Tolerance Patient tolerated treatment well             Past Medical History:  Diagnosis Date   Abnormal CAT scan 2013   Anxiety    Cancer [facility])    Family history of breast cancer    Family history of pancreatic cancer    Family history of prostate cancer    GERD (gastroesophageal reflux disease)    Hepatitis C 2001   Followed by Dr. Esmond Harps at [facility]   History of radiation therapy 07/26/17- 08/22/17   40.05 Gy directed to the left breast in 15 fractions followed by a boost of 10 Gy in 5 fractions.    Personal history of radiation therapy    PONV (postoperative nausea and vomiting)    Thyroid disease    Past Surgical History:  Procedure Laterality Date   ABDOMINAL HYSTERECTOMY  2009   breast cyst removal  1995   BREAST LUMPECTOMY Left    BREAST LUMPECTOMY WITH RADIOACTIVE SEED AND SENTINEL LYMPH NODE BIOPSY Left 06/14/2017   Procedure: LEFT BREAST SEED LOCALIZED LUMPECTOMY (2 SEEDS) AND SENTINEL LYMPH NODE BIOPSY;  Surgeon: Harriette Bouillon, MD;  Location: [facility];  Service: General;  Laterality: Left;   COLONOSCOPY  2011   COLONOSCOPY WITH PROPOFOL N/A 12/12/2017   Procedure: COLONOSCOPY WITH PROPOFOL;  Surgeon: Wyline Mood, MD;  Location: [facility] ENDOSCOPY;  Service: Gastroenterology;  Laterality: N/A;   ESOPHAGOGASTRODUODENOSCOPY (EGD) WITH PROPOFOL N/A 12/12/2017   Procedure: ESOPHAGOGASTRODUODENOSCOPY (EGD) WITH PROPOFOL;  Surgeon: Wyline Mood, MD;  Location: [facility] ENDOSCOPY;  Service: Gastroenterology;  Laterality: N/A;   FLEXIBLE SIGMOIDOSCOPY   February 12, 2013   have normal perianal exam, question focal prolapse. 2 small scars in the rectum.   HEMORRHOID BANDING  2012   3 times over three months   LEFT HEART CATH AND CORONARY ANGIOGRAPHY N/A 08/27/2019   Procedure: LEFT HEART CATH AND CORONARY ANGIOGRAPHY;  Surgeon: Dolores Patty, MD;  Location: [facility] INVASIVE CV LAB;  Service: Cardiovascular;  Laterality: N/A;   SIGMOIDOSCOPY  2013   TONSILECTOMY/ADENOIDECTOMY WITH MYRINGOTOMY  remote   UPPER GI ENDOSCOPY  2013   Patient Active Problem List   Diagnosis Date Noted   B12 deficiency 09/08/2021   Subclinical hyperthyroidism 02/28/2020   Multinodular goiter 02/28/2020   Genetic testing 07/28/2017   GERD without esophagitis 07/05/2017   Family history of pancreatic cancer    Family history of prostate cancer    Family history of breast cancer    Aortic atherosclerosis (HCC) 04/28/2017   Malignant neoplasm of upper-inner quadrant of left breast in female, estrogen receptor positive (HCC) 04/27/2017   Hepatitis C, chronic (HCC) 09/21/2016   Health care maintenance 07/29/2015   Menopausal disorder 01/20/2014   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   Rectal pain 05/31/2013   Hemorrhoids 05/31/2013   Rectal fissure 11/20/2012   Small intestinal bacterial overgrowth 10/11/2012   Bloating 10/03/2012   Nausea 10/03/2012   Anal pain 05/09/2012   Anxiety  and depression 04/04/2012   Diverticulosis 03/21/2012   SHORTNESS OF BREATH 01/08/2009    PCP: PCP - General, Internal Medicine   REFERRING PROVIDER: Orpah Melter, MD  REFERRING DIAG: [phone] (ICD-10-CM) - Pain in left shoulder   THERAPY DIAG: Decreased ROM of left shoulder  Acute pain of left shoulder  Rationale for Evaluation and Treatment: Rehabilitation  ONSET DATE: Couple month  SUBJECTIVE:                                                                                                                                                                                       SUBJECTIVE STATEMENT:  Pt reports no pain at rest, still having 7-8/10 NPS with L shoulder abduction.   Hand dominance: Right  PERTINENT HISTORY:  Per MD  Patient presents for left shoulder pain has been ongoing for about a month. This developed gradually without inciting incident. On examination she seems to have subtle loss of external rotation but preserved abduction and forward flexion/ internal rotation. Her strength is full on examination. On ultrasound examination there is no evidence of full-thickness rotator cuff tears. She has some disease present in the supraspinatus tendon. She does have a history of radiation to the breast 5 years prior. 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 We discussed the treatment including physical therapy as the main stay of treatment for capsule stretching/rotator cuff strengthening. We also discussed potentially trialing a subacromial or glenohumeral injection for pain relief. She were to develop further stiffening we could potentially are for a large-volume injection. She will start PT and follow up PRN.   PAIN:  Are you having pain? Yes: NPRS scale: 7/10 Pain location: lateral L shoulder Pain description: just pain Aggravating factors: abduction Relieving factors: not going into those movements. Worst: 8/10 with abduction Best: 0/10  PRECAUTIONS: Other: History of breast cancer of the L breast.  WEIGHT BEARING RESTRICTIONS: No  FALLS:  Has patient fallen in last 6 months? No  LIVING ENVIRONMENT: Lives with: lives with their spouse Lives in: House/apartment Stairs: Yes: External: 4 steps; can reach both Has following equipment at home: Grab bars  OCCUPATION: Hair dresser  PLOF: Independent  PATIENT GOALS: Pt just wants to reduce pain  NEXT MD VISIT:   OBJECTIVE:   DIAGNOSTIC FINDINGS:  On ultrasound examination there is no evidence of full-thickness rotator cuff tears. She has  some disease present in the supraspinatus tendon.   PATIENT SURVEYS:  FOTO 73/73  COGNITION: Overall cognitive status: Within functional limits for tasks assessed     SENSATION: WFL  POSTURE: Good sitting posture  UPPER EXTREMITY ROM:  Active ROM Right eval Left eval  Shoulder flexion 172 deg 171 deg  Shoulder extension    Shoulder abduction 166 deg 92 deg  Shoulder adduction    Shoulder internal rotation 90 70  Shoulder external rotation 90 79*  Elbow flexion    Elbow extension    Wrist flexion    Wrist extension    Wrist ulnar deviation    Wrist radial deviation    Wrist pronation    Wrist supination    (Blank rows = not tested)  UPPER EXTREMITY MMT:  MMT Right eval Left eval  Shoulder flexion 4+ 4-  Shoulder extension    Shoulder abduction 4 *3+  Shoulder adduction    Shoulder internal rotation    Shoulder external rotation    Middle trapezius    Lower trapezius    Elbow flexion 4+ 4  Elbow extension 4+ *3+  Wrist flexion    Wrist extension    Wrist ulnar deviation    Wrist radial deviation    Wrist pronation    Wrist supination    Grip strength (lbs)    (Blank rows = not tested)  SHOULDER SPECIAL TESTS: Impingement tests: Neer impingement test: negative, Hawkins/Kennedy impingement test: negative, Painful arc test: positive , and O' Briens test: positive  Rotator cuff assessment: Empty can test: positive , Full can test: positive , External rotation lag sign: negative, Belly press test: positive , and Infraspinatus test: positive  Biceps assessment: Speed's test: negative  JOINT MOBILITY TESTING: Good joint mobility, however with increased pain upon returning to neutral position while doing AP glides.  PALPATION: Pt with significant TP's noted in infraspinatus and teres minor/major upon palpation.   TODAY'S TREATMENT: DATE: 09/23/22  There.Ex: Seated row at [facility] 25# 2x10  Seated lat pull down at [facility] 25# 2x10 Seated chest press at  [facility], 25#, 2x10 Sidelying ER with 3# DB, 2x10 Standing scaption with 3# DB's, 2x10 B shoulder ER/IR isometric walk outs: L shoulder, GTB. 2x10, 3 steps.  Standing wall angels short lever arm, with increased pain, but able to perform x10 Supine serratus punches with GTB resistance, 2x10. Mod multimodal cuing for form/technique.      PATIENT EDUCATION: Education details: Pt educated on role of physical therapy and prognosis of current issues in the L shoulder.  Pt educated on expected response to exercises as well as dry needling that may be implemented at next session. Person educated: Patient Education method: Explanation Education comprehension: verbalized understanding  HOME EXERCISE PROGRAM:  Access Code: [email or web address] URL: [email or web address] Date: 08/31/2022 Prepared by: Tomasa Hose  Exercises - Seated Scapular Retraction  - 1 x daily - 7 x weekly - 3 sets - 10 reps - Isometric Shoulder Flexion with Ball at Wall  - 1 x daily - 7 x weekly - 3 sets - 10 reps - Isometric Shoulder Adduction  - 1 x daily - 7 x weekly - 3 sets - 10 reps - Isometric Shoulder Extension with Ball at Wall  - 1 x daily - 7 x weekly - 3 sets - 10 reps - Isometric Shoulder Abduction with Ball at Wall  - 1 x daily - 7 x weekly - 3 sets - 10 reps - Supine Shoulder External Rotation on Foam Roll with Theraband  - 1 x daily - 7 x weekly - 3 sets - 10 reps   ASSESSMENT:  CLINICAL IMPRESSION: Continuing PT POC with focus on periscapular strengthening and RTC loading for impingement/tendinitis. Pt remains significantly limited with L  shoulder abduction as primary aggravator. She is however able to perform higher levels of abduction AROM with short lever arm versus long lever arm. Reviewed shoulder anatomy and its' pertinence/relation for activity modification to prevent aggravation of symptoms with work and recreational tasks. Pt will continue to benefit from skilled therapy to address remaining  deficits in order to improve overall QoL and return to PLOF.   OBJECTIVE IMPAIRMENTS: decreased activity tolerance, decreased ROM, decreased strength, and pain.   ACTIVITY LIMITATIONS: carrying, lifting, and dressing  PARTICIPATION LIMITATIONS: meal prep, cleaning, laundry, driving, community activity, and occupation  PERSONAL FACTORS: Age, Fitness, Past/current experiences, Profession, Sex, Time since onset of injury/illness/exacerbation, and 1-2 comorbidities: Hx of breast cancer, anxiety/depression  are also affecting patient's functional outcome.   REHAB POTENTIAL: Good  CLINICAL DECISION MAKING: Stable/uncomplicated  EVALUATION COMPLEXITY: Low   GOALS: Goals reviewed with patient? Yes  SHORT TERM GOALS: Target date: 09/22/2022  Pt will be independent with HEP in order to demonstrate increased ability to perform tasks related to occupation/hobbies. Baseline:  Goal status: INITIAL   LONG TERM GOALS: Target date: 11/17/2022  Patient will demonstrate improved function as evidenced by a score of 76 on FOTO measure for full participation in activities at home and in the community. Baseline: FOTO: 73 Goal status: INITIAL  2.  Patient will improve shoulder AROM to > 140 degrees of abduction for improved ability to perform overhead activities. Baseline: 92 deg Goal status: INITIAL  3.  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 Baseline: 8/10 when performing abduction Goal status: INITIAL  PLAN:  PT FREQUENCY: 1-2x/week  PT DURATION: 12 weeks  PLANNED INTERVENTIONS: Therapeutic exercises, Therapeutic activity, Neuromuscular re-education, Balance training, Gait training, Patient/Family education, Self Care, Joint mobilization, Joint manipulation, Vestibular training, Dry Needling, Spinal manipulation, Spinal mobilization, Cryotherapy, Moist heat, Ultrasound, Parrafin, Manual  therapy, and Re-evaluation  PLAN FOR NEXT SESSION: periscap strengthening, posterior capsule stretching. Scapulohumeral rhythm.    Delphia Grates. Fairly IV, PT, DPT Physical Therapist- [facility]  09/23/22, 2:18 PM

## 2022-09-27 ENCOUNTER — Other Ambulatory Visit: Payer: Self-pay

## 2022-09-27 DIAGNOSIS — Z87440 Personal history of urinary (tract) infections: Secondary | ICD-10-CM

## 2022-09-28 ENCOUNTER — Ambulatory Visit: Admitting: Urology

## 2022-09-28 ENCOUNTER — Ambulatory Visit

## 2022-09-28 ENCOUNTER — Encounter: Payer: Self-pay | Admitting: Urology

## 2022-09-28 ENCOUNTER — Encounter: Payer: Self-pay | Admitting: Adult Health

## 2022-09-28 VITALS — BP 136/84 | HR 92 | Ht 67.0 in | Wt 147.0 lb

## 2022-09-28 DIAGNOSIS — N952 Postmenopausal atrophic vaginitis: Secondary | ICD-10-CM

## 2022-09-28 DIAGNOSIS — Z87440 Personal history of urinary (tract) infections: Secondary | ICD-10-CM

## 2022-09-28 DIAGNOSIS — N390 Urinary tract infection, site not specified: Secondary | ICD-10-CM

## 2022-09-28 DIAGNOSIS — Z09 Encounter for follow-up examination after completed treatment for conditions other than malignant neoplasm: Secondary | ICD-10-CM

## 2022-09-28 MED ORDER — NITROFURANTOIN MONOHYD MACRO 100 MG PO CAPS: 100.0000 mg | ORAL_CAPSULE | Freq: Every day | ORAL | 0 refills | Status: DC

## 2022-09-28 MED ORDER — ESTRADIOL 0.1 MG/GM VA CREA: TOPICAL_CREAM | VAGINAL | 12 refills | Status: DC

## 2022-09-28 NOTE — Progress Notes (Signed)
09/28/22 4:07 PM   Courtney Romero 1960-07-29 [id number]  CC: Recurrent UTIs  HPI: 62 year old female with distant history of breast cancer who reports numerous UTIs over the last year.  These seemed to start around the time she went through menopause.  She denies any gross hematuria or urinary symptoms when she does not have a UTI.  Typical UTI symptoms are pain with urination.  She recently had a CT abdomen and pelvis with contrast in March that showed no urologic abnormalities   PMH: Past Medical History:  Diagnosis Date   Abnormal CAT scan 2013   Anxiety    Cancer [facility])    Family history of breast cancer    Family history of pancreatic cancer    Family history of prostate cancer    GERD (gastroesophageal reflux disease)    Hepatitis C 2001   Followed by Dr. Esmond Harps at [facility]   History of radiation therapy 07/26/17- 08/22/17   40.05 Gy directed to the left breast in 15 fractions followed by a boost of 10 Gy in 5 fractions.    Personal history of radiation therapy    PONV (postoperative nausea and vomiting)    Thyroid disease     Surgical History: Past Surgical History:  Procedure Laterality Date   ABDOMINAL HYSTERECTOMY  2009   breast cyst removal  1995   BREAST LUMPECTOMY Left    BREAST LUMPECTOMY WITH RADIOACTIVE SEED AND SENTINEL LYMPH NODE BIOPSY Left 06/14/2017   Procedure: LEFT BREAST SEED LOCALIZED LUMPECTOMY (2 SEEDS) AND SENTINEL LYMPH NODE BIOPSY;  Surgeon: Harriette Bouillon, MD;  Location: [facility];  Service: General;  Laterality: Left;   COLONOSCOPY  2011   COLONOSCOPY WITH PROPOFOL N/A 12/12/2017   Procedure: COLONOSCOPY WITH PROPOFOL;  Surgeon: Wyline Mood, MD;  Location: [facility] ENDOSCOPY;  Service: Gastroenterology;  Laterality: N/A;   ESOPHAGOGASTRODUODENOSCOPY (EGD) WITH PROPOFOL N/A 12/12/2017   Procedure: ESOPHAGOGASTRODUODENOSCOPY (EGD) WITH PROPOFOL;  Surgeon: Wyline Mood, MD;  Location: [facility] ENDOSCOPY;  Service: Gastroenterology;   Laterality: N/A;   FLEXIBLE SIGMOIDOSCOPY  February 12, 2013   have normal perianal exam, question focal prolapse. 2 small scars in the rectum.   HEMORRHOID BANDING  2012   3 times over three months   LEFT HEART CATH AND CORONARY ANGIOGRAPHY N/A 08/27/2019   Procedure: LEFT HEART CATH AND CORONARY ANGIOGRAPHY;  Surgeon: Dolores Patty, MD;  [facility];  Service: Cardiovascular;  Laterality: N/A;   SIGMOIDOSCOPY  2013   TONSILECTOMY/ADENOIDECTOMY WITH MYRINGOTOMY  remote   UPPER GI ENDOSCOPY  2013     Family History: Family History  Problem Relation Age of Onset   Hypertension Mother    Thyroid disease Mother        Multi problems   Coronary artery disease Father    Heart failure Father    Atrial fibrillation Father    Diabetes Brother    Colon cancer Neg Hx    Esophageal cancer Neg Hx    Rectal cancer Neg Hx    Stomach cancer Neg Hx     Social History:  reports that she quit smoking about 24 years ago. Her smoking use included cigarettes. She has a 30.00 pack-year smoking history. She has been exposed to tobacco smoke. She has never used smokeless tobacco. She reports that she does not drink alcohol and does not use drugs.  Physical Exam: BP 136/84   Pulse 92   Ht 5\' 7"  (1.702 m)   Wt 147 lb (  66.7 kg)   BMI 23.02 kg/m    Constitutional:  Alert and oriented, No acute distress. Cardiovascular: No clubbing, cyanosis, or edema. Respiratory: Normal respiratory effort, no increased work of breathing. GI: Abdomen is soft, nontender, nondistended, no abdominal masses  Laboratory Data: Culture data reviewed in epic, see Care Everywhere  Pertinent Imaging: I have personally viewed and interpreted the CT from March 2024 showing no urologic abnormalities.  Assessment & Plan:   62 year old female with recurrent culture documented UTIs over the last year  We discussed the evaluation and treatment of patients with recurrent UTIs at length.  We specifically  discussed the differences between asymptomatic bacteriuria and true urinary tract infection.  We discussed the AUA definition of recurrent UTI of at least 2 culture proven symptomatic acute cystitis episodes in a 22-month period, or 3 within a 1 year period.  We discussed the importance of culture directed antibiotic treatment, and antibiotic stewardship.  First-line therapy includes nitrofurantoin(5 days), Bactrim(3 days), or fosfomycin(3 g single dose).  Possible etiologies of recurrent infection include periurethral tissue atrophy in postmenopausal woman, constipation, sexual activity, incomplete emptying, anatomic abnormalities, and even genetic predisposition.  Finally, we discussed the role of perineal hygiene, timed voiding, adequate hydration, topical vaginal estrogen, cranberry prophylaxis, and low-dose antibiotic prophylaxis.  Trial of topical estrogen cream, cranberry tablets, 90 days nitrofurantoin prophylaxis RTC 4 months symptom check   Legrand Rams, MD 09/28/2022  [facility] [address] [place], [place] [address] [phone]

## 2022-09-29 ENCOUNTER — Encounter: Payer: Self-pay | Admitting: Hematology and Oncology

## 2022-09-30 ENCOUNTER — Encounter

## 2022-10-05 ENCOUNTER — Ambulatory Visit

## 2022-10-05 DIAGNOSIS — M25512 Pain in left shoulder: Secondary | ICD-10-CM

## 2022-10-05 DIAGNOSIS — M25612 Stiffness of left shoulder, not elsewhere classified: Secondary | ICD-10-CM

## 2022-10-05 NOTE — Therapy (Signed)
OUTPATIENT PHYSICAL THERAPY SHOULDER TREATMENT   Patient Name: ROSALYNNE PRYER MRN: [id number] DOB:[birth year], 62 y.o., female Today's Date: 10/05/2022  END OF SESSION:  PT End of Session - 10/05/22 1303     Visit Number 6    Number of Visits 24    Date for PT Re-Evaluation 11/17/22    Progress Note Due on Visit 10    PT Start Time 1301    PT Stop Time 1345    PT Time Calculation (min) 44 min    Activity Tolerance Patient tolerated treatment well             Past Medical History:  Diagnosis Date   Abnormal CAT scan 2013   Anxiety    Cancer [facility])    Family history of breast cancer    Family history of pancreatic cancer    Family history of prostate cancer    GERD (gastroesophageal reflux disease)    Hepatitis C 2001   Followed by Dr. Esmond Harps at [facility]   History of radiation therapy 07/26/17- 08/22/17   40.05 Gy directed to the left breast in 15 fractions followed by a boost of 10 Gy in 5 fractions.    Personal history of radiation therapy    PONV (postoperative nausea and vomiting)    Thyroid disease    Past Surgical History:  Procedure Laterality Date   ABDOMINAL HYSTERECTOMY  2009   breast cyst removal  1995   BREAST LUMPECTOMY Left    BREAST LUMPECTOMY WITH RADIOACTIVE SEED AND SENTINEL LYMPH NODE BIOPSY Left 06/14/2017   Procedure: LEFT BREAST SEED LOCALIZED LUMPECTOMY (2 SEEDS) AND SENTINEL LYMPH NODE BIOPSY;  Surgeon: Harriette Bouillon, MD;  Location: [facility];  Service: General;  Laterality: Left;   COLONOSCOPY  2011   COLONOSCOPY WITH PROPOFOL N/A 12/12/2017   Procedure: COLONOSCOPY WITH PROPOFOL;  Surgeon: Wyline Mood, MD;  Location: [facility] ENDOSCOPY;  Service: Gastroenterology;  Laterality: N/A;   ESOPHAGOGASTRODUODENOSCOPY (EGD) WITH PROPOFOL N/A 12/12/2017   Procedure: ESOPHAGOGASTRODUODENOSCOPY (EGD) WITH PROPOFOL;  Surgeon: Wyline Mood, MD;  Location: [facility] ENDOSCOPY;  Service: Gastroenterology;  Laterality: N/A;   FLEXIBLE SIGMOIDOSCOPY   February 12, 2013   have normal perianal exam, question focal prolapse. 2 small scars in the rectum.   HEMORRHOID BANDING  2012   3 times over three months   LEFT HEART CATH AND CORONARY ANGIOGRAPHY N/A 08/27/2019   Procedure: LEFT HEART CATH AND CORONARY ANGIOGRAPHY;  Surgeon: Dolores Patty, MD;  [facility];  Service: Cardiovascular;  Laterality: N/A;   SIGMOIDOSCOPY  2013   TONSILECTOMY/ADENOIDECTOMY WITH MYRINGOTOMY  remote   UPPER GI ENDOSCOPY  2013   Patient Active Problem List   Diagnosis Date Noted   B12 deficiency 09/08/2021   Subclinical hyperthyroidism 02/28/2020   Multinodular goiter 02/28/2020   Genetic testing 07/28/2017   GERD without esophagitis 07/05/2017   Family history of pancreatic cancer    Family history of prostate cancer    Family history of breast cancer    Aortic atherosclerosis (HCC) 04/28/2017   Malignant neoplasm of upper-inner quadrant of left breast in female, estrogen receptor positive (HCC) 04/27/2017   Hepatitis C, chronic (HCC) 09/21/2016   Health care maintenance 07/29/2015   Menopausal disorder 01/20/2014   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   Rectal pain 05/31/2013   Hemorrhoids 05/31/2013   Rectal fissure 11/20/2012   Small intestinal bacterial overgrowth 10/11/2012   Bloating 10/03/2012   Nausea 10/03/2012   Anal pain 05/09/2012   Anxiety  and depression 04/04/2012   Diverticulosis 03/21/2012   SHORTNESS OF BREATH 01/08/2009    PCP: PCP - General, Internal Medicine   REFERRING PROVIDER: Orpah Melter, MD  REFERRING DIAG: [phone] (ICD-10-CM) - Pain in left shoulder   THERAPY DIAG: Decreased ROM of left shoulder  Acute pain of left shoulder  Rationale for Evaluation and Treatment: Rehabilitation  ONSET DATE: Couple month  SUBJECTIVE:                                                                                                                                                                                       SUBJECTIVE STATEMENT:  Pt reports no pain at rest, still having 7-8/10 NPS with L shoulder abduction. Has been on vacation this past week. Able to get in the pool to exercise. Reports free style, breast stroke, and backstroke were mildly aggravating but able to perform some shoulder abduction in the pool without aggravation.   Hand dominance: Right  PERTINENT HISTORY:  Per MD  Patient presents for left shoulder pain has been ongoing for about a month. This developed gradually without inciting incident. On examination she seems to have subtle loss of external rotation but preserved abduction and forward flexion/ internal rotation. Her strength is full on examination. On ultrasound examination there is no evidence of full-thickness rotator cuff tears. She has some disease present in the supraspinatus tendon. She does have a history of radiation to the breast 5 years prior. 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 We discussed the treatment including physical therapy as the main stay of treatment for capsule stretching/rotator cuff strengthening. We also discussed potentially trialing a subacromial or glenohumeral injection for pain relief. She were to develop further stiffening we could potentially are for a large-volume injection. She will start PT and follow up PRN.   PAIN:  Are you having pain? Yes: NPRS scale: 7/10 Pain location: lateral L shoulder Pain description: just pain Aggravating factors: abduction Relieving factors: not going into those movements. Worst: 8/10 with abduction Best: 0/10  PRECAUTIONS: Other: History of breast cancer of the L breast.  WEIGHT BEARING RESTRICTIONS: No  FALLS:  Has patient fallen in last 6 months? No  LIVING ENVIRONMENT: Lives with: lives with their spouse Lives in: House/apartment Stairs: Yes: External: 4 steps; can reach both Has following equipment at home: Grab bars  OCCUPATION: Hair  dresser  PLOF: Independent  PATIENT GOALS: Pt just wants to reduce pain  NEXT MD VISIT:   OBJECTIVE:   DIAGNOSTIC FINDINGS:  On ultrasound examination there is no evidence of full-thickness rotator cuff tears. She has some disease present in  the supraspinatus tendon.   PATIENT SURVEYS:  FOTO 73/73  COGNITION: Overall cognitive status: Within functional limits for tasks assessed     SENSATION: WFL  POSTURE: Good sitting posture  UPPER EXTREMITY ROM:  Active ROM Right eval Left eval  Shoulder flexion 172 deg 171 deg  Shoulder extension    Shoulder abduction 166 deg 92 deg  Shoulder adduction    Shoulder internal rotation 90 70  Shoulder external rotation 90 79*  Elbow flexion    Elbow extension    Wrist flexion    Wrist extension    Wrist ulnar deviation    Wrist radial deviation    Wrist pronation    Wrist supination    (Blank rows = not tested)  UPPER EXTREMITY MMT:  MMT Right eval Left eval  Shoulder flexion 4+ 4-  Shoulder extension    Shoulder abduction 4 *3+  Shoulder adduction    Shoulder internal rotation    Shoulder external rotation    Middle trapezius    Lower trapezius    Elbow flexion 4+ 4  Elbow extension 4+ *3+  Wrist flexion    Wrist extension    Wrist ulnar deviation    Wrist radial deviation    Wrist pronation    Wrist supination    Grip strength (lbs)    (Blank rows = not tested)  SHOULDER SPECIAL TESTS: Impingement tests: Neer impingement test: negative, Hawkins/Kennedy impingement test: negative, Painful arc test: positive , and O' Briens test: positive  Rotator cuff assessment: Empty can test: positive , Full can test: positive , External rotation lag sign: negative, Belly press test: positive , and Infraspinatus test: positive  Biceps assessment: Speed's test: negative  JOINT MOBILITY TESTING: Good joint mobility, however with increased pain upon returning to neutral position while doing AP glides.  PALPATION: Pt with  significant TP's noted in infraspinatus and teres minor/major upon palpation.   TODAY'S TREATMENT: DATE: 10/05/22  There.Ex: Seated UBE level 5, 3 min forward, 3 min backwards for warm up and periscapular strengthening. Discussion on water based exercises in pool with pool floats and weights during exercise.   Seated row at [facility] 25# 3x10   Seated lat pull down at [facility] 25# 3x10  R Side lying for L shoulder ER with 4# DB, 3x10  Standing scaption with 3# DB's, 3x8 bilat  B shoulder ER/IR isometric walk outs: L/R shoulder, Blue TB. 3x10, 3 steps bilat   Supine serratus punches with 4# DB, 3x10. Mod multimodal cuing for form/technique.   Standing B shoulder scaption with isometric RTB shoulder ER: 3x10   PATIENT EDUCATION: Education details: Pt educated on role of physical therapy and prognosis of current issues in the L shoulder.  Pt educated on expected response to exercises as well as dry needling that may be implemented at next session. Person educated: Patient Education method: Explanation Education comprehension: verbalized understanding  HOME EXERCISE PROGRAM:  Access Code: [email or web address] URL: [email or web address] Date: 08/31/2022 Prepared by: Tomasa Hose  Exercises - Seated Scapular Retraction  - 1 x daily - 7 x weekly - 3 sets - 10 reps - Isometric Shoulder Flexion with Ball at Wall  - 1 x daily - 7 x weekly - 3 sets - 10 reps - Isometric Shoulder Adduction  - 1 x daily - 7 x weekly - 3 sets - 10 reps - Isometric Shoulder Extension with Ball at Wall  - 1 x daily - 7 x weekly - 3 sets - 10 reps - Isometric Shoulder  Abduction with Ball at [employer]  - 1 x daily - 7 x weekly - 3 sets - 10 reps - Supine Shoulder External Rotation on Foam Roll with Theraband  - 1 x daily - 7 x weekly - 3 sets - 10 reps   ASSESSMENT:  CLINICAL IMPRESSION: Pt arriving after vacation with L shoulder grossly unchanged. Continuing POC with focus on pain free overhead mobility and  periscapular strengthening in setting of shoulder impingement/tendinitis. No worsening of symptoms throughout session. Encouraged pool therapy. Educated on exercises she can perform to use buoyancy and pool weights for pain free resistance training. Pt understanding. Pt will continue to benefit from skilled therapy to address remaining deficits in order to improve overall QoL and return to PLOF.   OBJECTIVE IMPAIRMENTS: decreased activity tolerance, decreased ROM, decreased strength, and pain.   ACTIVITY LIMITATIONS: carrying, lifting, and dressing  PARTICIPATION LIMITATIONS: meal prep, cleaning, laundry, driving, community activity, and occupation  PERSONAL FACTORS: Age, Fitness, Past/current experiences, Profession, Sex, Time since onset of injury/illness/exacerbation, and 1-2 comorbidities: Hx of breast cancer, anxiety/depression  are also affecting patient's functional outcome.   REHAB POTENTIAL: Good  CLINICAL DECISION MAKING: Stable/uncomplicated  EVALUATION COMPLEXITY: Low   GOALS: Goals reviewed with patient? Yes  SHORT TERM GOALS: Target date: 09/22/2022  Pt will be independent with HEP in order to demonstrate increased ability to perform tasks related to occupation/hobbies. Baseline:  Goal status: INITIAL   LONG TERM GOALS: Target date: 11/17/2022  Patient will demonstrate improved function as evidenced by a score of 76 on FOTO measure for full participation in activities at home and in the community. Baseline: FOTO: 73 Goal status: INITIAL  2.  Patient will improve shoulder AROM to > 140 degrees of abduction for improved ability to perform overhead activities. Baseline: 92 deg Goal status: INITIAL  3.  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 Baseline: 8/10 when performing abduction Goal status: INITIAL  PLAN:  PT FREQUENCY: 1-2x/week  PT DURATION: 12  weeks  PLANNED INTERVENTIONS: Therapeutic exercises, Therapeutic activity, Neuromuscular re-education, Balance training, Gait training, Patient/Family education, Self Care, Joint mobilization, Joint manipulation, Vestibular training, Dry Needling, Spinal manipulation, Spinal mobilization, Cryotherapy, Moist heat, Ultrasound, Parrafin, Manual therapy, and Re-evaluation  PLAN FOR NEXT SESSION: periscap strengthening, posterior capsule stretching. Scapulohumeral rhythm.    Delphia Grates. Fairly IV, PT, DPT Physical Therapist- [place]  [facility]  10/05/22, 2:19 PM

## 2022-10-10 ENCOUNTER — Encounter: Payer: Self-pay | Admitting: Gastroenterology

## 2022-10-11 ENCOUNTER — Other Ambulatory Visit: Payer: Self-pay

## 2022-10-11 MED ORDER — OMEPRAZOLE 20 MG PO CPDR: 20.0000 mg | DELAYED_RELEASE_CAPSULE | Freq: Every day | ORAL | 1 refills | Status: DC

## 2022-10-11 NOTE — Progress Notes (Signed)
Patient requested omep 20 mg sent to [facility]

## 2022-10-12 ENCOUNTER — Ambulatory Visit

## 2022-10-14 ENCOUNTER — Encounter

## 2022-10-19 ENCOUNTER — Encounter

## 2022-10-22 ENCOUNTER — Telehealth: Payer: Self-pay | Admitting: Hematology and Oncology

## 2022-11-11 ENCOUNTER — Ambulatory Visit (INDEPENDENT_AMBULATORY_CARE_PROVIDER_SITE_OTHER): Admitting: Gastroenterology

## 2022-11-11 ENCOUNTER — Encounter: Payer: Self-pay | Admitting: Gastroenterology

## 2022-11-11 VITALS — BP 136/80 | HR 84 | Ht 66.5 in | Wt 146.4 lb

## 2022-11-11 DIAGNOSIS — K641 Second degree hemorrhoids: Secondary | ICD-10-CM

## 2022-11-11 NOTE — Progress Notes (Signed)
62 year old female here for follow-up visit for hemorrhoid banding.  Recall she has had ongoing intermittent hemorrhoid symptoms for some time.  She has discomfort, irritation, prolapse that appears to be her main symptoms.  She states she had banding several years ago which provided benefit but symptoms have since come back.  We discussed options and she wanted to proceed with hemorrhoid banding at the last visit in May. We banded the RP hemorrhoid.  She states she tolerated it well.  She has sensitivity in the past remotely from this, we gave her some topical nitroglycerin ointment however she did not pick it up as she did not think she needed it.  She states her symptoms are 50% proved after the first banding.  Irritation is not as bad as previous but she still has it to some extent.  She tried taking Citrucel powder but states it made her nauseated and did not like the way it made her feel so she stopped taking it.  She states she is not having any straining, passing soft stools.  She wants to continue with additional banding today after discussion of options   EGD 04/21/22: - Esophagogastric landmarks were identified: the Z-line was found at 40 cm, the gastroesophageal junction was found at 40 cm and the upper extent of the gastric folds was found at 41 cm from the incisors. Findings: - A 1 cm hiatal hernia was present. - The exam of the esophagus was otherwise normal. - Multiple benign small sessile polyps were found in the entire examined stomach, grossly consistent with benign fundic gland polyps. Biopsies were taken with a cold forceps for histology as a representative sample. - Patchy mildly erythematous mucosa was found in the gastric fundus. - The exam of the stomach was otherwise normal. - Biopsies were taken with a cold forceps in the gastric body, at the incisura and in the gastric antrum for Helicobacter pylori testing. - The examined duodenum was normal.   Colonoscopy 04/21/22: - Skin tags were  found on perianal exam. - The terminal ileum appeared normal. - A 3 mm polyp was found in the ascending colon. The polyp was sessile. The polyp was removed with a cold snare. Resection and retrieval were complete. - A 4 mm polyp was found in the sigmoid colon. The polyp was sessile. The polyp was removed with a cold snare. Resection and retrieval were complete. - A few small-mouthed diverticula were found in the sigmoid colon. - Internal hemorrhoids were found during retroflexion. The hemorrhoids were moderate. - The exam was otherwise without abnormality.   1. Surgical [P], gastric polyps - FUNDIC GLAND POLYP(S) - NEGATIVE FOR HELICOBACTER ORGANISMS ON H&E STAIN 2. Surgical [P], gastric antrum and gastric body - ANTRAL AND OXYNTIC MUCOSAE WITH REACTIVE (CHEMICAL) GASTROPATHY - OXYNTIC MUCOSA WITH EARLY FUNDIC GLAND POLYP LIKE CHANGES - IMMUNOHISTOCHEMICAL STAIN FOR HELICOBACTER ORGANISMS IS NEGATIVE 3. Surgical [P], colon, ascending, polyp (1) - TUBULAR ADENOMA - NEGATIVE FOR HIGH-GRADE DYSPLASIA OR MALIGNANCY 4. Surgical [P], colon, sigmoid, polyp (1) - HYPERPLASTIC POLYP     PROCEDURE NOTE: The patient presents with symptomatic grade II  hemorrhoids, requesting rubber band ligation of his/her hemorrhoidal disease.  All risks, benefits and alternative forms of therapy were described and informed consent was obtained.   The anorectum was pre-medicated with 0.125% nitroglycerin The decision was made to band the LL internal hemorrhoid, and the [facility] System was used to perform band ligation without complication.  Digital anorectal examination was then performed to assure  proper positioning of the band, and to adjust the banded tissue as required.  The patient was discharged home without pain or other issues.  Dietary and behavioral recommendations were given and along with follow-up instructions.     The following adjunctive treatments were recommended: -Start calmol4 suppositories PRN  for irritation -Recticare samples PRN -Nitroglycerin ordered in case needed - she has had sensitivity in the past but not on the last banding -miralax PRN to keep stools soft    The patient will return in a few months follow-up and possible additional banding as required. No complications were encountered and the patient tolerated the procedure well.    Harlin Rain, MD [facility]

## 2022-11-11 NOTE — Patient Instructions (Signed)
HEMORRHOID BANDING PROCEDURE    FOLLOW-UP CARE   The procedure you have had should have been relatively painless since the banding of the area involved does not have nerve endings and there is no pain sensation.  The rubber band cuts off the blood supply to the hemorrhoid and the band may fall off as soon as 48 hours after the banding (the band may occasionally be seen in the toilet bowl following a bowel movement). You may notice a temporary feeling of fullness in the rectum which should respond adequately to plain Tylenol or Motrin.  Following the banding, avoid strenuous exercise that evening and resume full activity the next day.  A sitz bath (soaking in a warm tub) or bidet is soothing, and can be useful for cleansing the area after bowel movements.     To avoid constipation, take two tablespoons of natural wheat bran, natural oat bran, flax, Benefiber or any over the counter fiber supplement and increase your water intake to 7-8 glasses daily.    Unless you have been prescribed anorectal medication, do not put anything inside your rectum for two weeks: No suppositories, enemas, fingers, etc.  Occasionally, you may have more bleeding than usual after the banding procedure.  This is often from the untreated hemorrhoids rather than the treated one.  Don't be concerned if there is a tablespoon or so of blood.  If there is more blood than this, lie flat with your bottom higher than your head and apply an ice pack to the area. If the bleeding does not stop within a half an hour or if you feel faint, call our office at [phone] or go to the emergency room.  Problems are not common; however, if there is a substantial amount of bleeding, severe pain, chills, fever or difficulty passing urine (very rare) or other problems, you should call us at [phone] or report to the nearest emergency room.  Do not stay seated continuously for more than 2-3 hours for a day or two after the procedure.   Tighten your buttock muscles 10-15 times every two hours and take 10-15 deep breaths every 1-2 hours.  Do not spend more than a few minutes on the toilet if you cannot empty your bowel; instead re-visit the toilet at a later time.     We have given you samples of the following medication to take:  Calmol 4 suppositories - wait a couple of days after your hemorrhoid banding appointment to use.  Then use as needed RectiCare Advanced Cream  You have been scheduled for a 3rd banding appointment on Tuesday, 9-17 at 4:00pm. Please arrive 10 minutes early for registration. If you need to reschedule or cancel this appointment please call [phone] as soon as possible. Thank you.   Thank you for entrusting me with your care and for choosing [facility], Dr. Ileene Patrick

## 2022-12-10 ENCOUNTER — Telehealth: Payer: Self-pay | Admitting: Internal Medicine

## 2022-12-10 NOTE — Telephone Encounter (Signed)
Pt. On recall list and called pt. She was under the impression she didn't need to make a f/u apt unless you advise that you would like to see her for f/u

## 2022-12-10 NOTE — Progress Notes (Signed)
Patient Care Team: Lauro Regulus, MD as PCP - General (Internal Medicine) Jake Bathe, MD as PCP - Cardiology (Cardiology) Harriette Bouillon, MD as Consulting Physician (General Surgery) Dermatology, Leith Lonie Peak, MD as Attending Physician (Radiation Oncology) Axel Filler Larna Daughters, NP as Nurse Practitioner (Hematology and Oncology) Christeen Douglas, MD as Consulting Physician (Obstetrics and Gynecology) Erin Fulling, MD as Consulting Physician (Pulmonary Disease) Shamleffer, Konrad Dolores, MD as Consulting Physician (Endocrinology)  DIAGNOSIS: No diagnosis found.  SUMMARY OF ONCOLOGIC HISTORY: Oncology History  Malignant neoplasm of upper-inner quadrant of left breast in female, estrogen receptor positive (HCC)  04/08/2017 Initial Diagnosis   post biopsy of 2 areas in the upper outer quadrant of the left breast 04/08/2017, both showing extensive ductal carcinoma in situ low-grade, but both showing invasive ductal carcinoma (measuring 2-3 mm on the slides), grade 1, estrogen and progesterone receptor positive, HER-2 not amplified, with an MIB-1 of 5%   04/27/2017 Cancer Staging   Staging form: Breast, AJCC 8th Edition - Clinical: Stage IA (cT1a, cN0, cM0, G1, ER+, PR+, HER2-) - Signed by Lowella Dell, MD on 05/07/2020   04/29/2017 -  Anti-estrogen oral therapy   tamoxifen started neoadjuvantly 04/29/2017    06/14/2017 Surgery   left lumpectomy and sentinel lymph node sampling showed only residual ductal carcinoma in situ, low-grade, with negative margins.             (a) a total of 3 axillary lymph nodes were removed   06/22/2017 Cancer Staging   Staging form: Breast, AJCC 8th Edition - Pathologic: Stage 0 (pTis (DCIS), pN0, cM0) - Signed by Lowella Dell, MD on 05/07/2020   07/12/2017 Genetic Testing   The Common Hereditary Cancer Panel offered by [facility] includes sequencing and/or deletion duplication testing of the following 47 genes: APC, ATM,  AXIN2, BARD1, BMPR1A, BRCA1, BRCA2, BRIP1, CDH1, CDKN2A (p14ARF), CDKN2A (p16INK4a), CKD4, CHEK2, CTNNA1, DICER1, EPCAM (Deletion/duplication testing only), GREM1 (promoter region deletion/duplication testing only), KIT, MEN1, MLH1, MSH2, MSH3, MSH6, MUTYH, NBN, NF1, NHTL1, PALB2, PDGFRA, PMS2, POLD1, POLE, PTEN, RAD50, RAD51C, RAD51D, SDHB, SDHC, SDHD, SMAD4, SMARCA4. STK11, TP53, TSC1, TSC2, and VHL.  The following genes were evaluated for sequence changes only: SDHA and HOXB13 c.251G>A variant only.  Results:  Negative, No pathogenic variants identified.  The date of this test report is 07/12/2017.    07/26/2017 - 08/22/2017 Radiation Therapy   Site/dose:   40.05 Gy directed to the left breast in 15 fractions followed by a boost of 10 Gy in 5 fractions     CHIEF COMPLIANT:   INTERVAL HISTORY: Courtney Romero is a   ALLERGIES:  is allergic to propoxyphene, thioridazine hcl, bupropion, citalopram, duloxetine, and thioridazine.  MEDICATIONS:  Current Outpatient Medications  Medication Sig Dispense Refill   aspirin EC 81 MG tablet Take 1 tablet (81 mg total) by mouth daily. 90 tablet 3   Calcium Carbonate (CALCIUM 600 PO) Take 600 mg by mouth daily.      cholecalciferol (VITAMIN D) 1000 units tablet Take 1,000 Units by mouth daily.     cyanocobalamin (VITAMIN B12) 1000 MCG/ML injection Inject 1 mL (1,000 mcg total) into the muscle every 30 (thirty) days. 10 mL 3   estradiol (ESTRACE) 0.1 MG/GM vaginal cream Estrogen Cream Instruction Discard applicator Apply pea sized amount to tip of finger to urethra before bed. Wash hands well after application. Use Monday, Wednesday and Friday 42.5 g 12   hydrocortisone (ANUSOL-HC) 2.5 % rectal cream Place 1 Application rectally 2 (  two) times daily. 30 g 2   LORazepam (ATIVAN) 0.5 MG tablet Take 0.5 mg by mouth every 8 (eight) hours as needed for anxiety.      Melatonin 5 MG TABS Take 5 mg by mouth at bedtime.      nitrofurantoin,  macrocrystal-monohydrate, (MACROBID) 100 MG capsule Take 1 capsule (100 mg total) by mouth daily. 90 capsule 0   omeprazole (PRILOSEC) 20 MG capsule Take 1 capsule (20 mg total) by mouth daily. 90 capsule 1   rosuvastatin (CRESTOR) 20 MG tablet Take 1 tablet (20 mg total) by mouth daily. 90 tablet 3   zolpidem (AMBIEN) 5 MG tablet Take 5 mg by mouth at bedtime as needed for sleep.      No current facility-administered medications for this visit.    PHYSICAL EXAMINATION: ECOG PERFORMANCE STATUS: {CHL ONC ECOG PS:[id number]}  There were no vitals filed for this visit. There were no vitals filed for this visit.  BREAST:*** No palpable masses or nodules in either right or left breasts. No palpable axillary supraclavicular or infraclavicular adenopathy no breast tenderness or nipple discharge. (exam performed in the presence of a chaperone)  LABORATORY DATA:  I have reviewed the data as listed    Latest Ref Rng & Units 06/22/2022    8:59 AM 12/22/2021   11:00 AM 09/01/2021   11:49 AM  CMP  Glucose 70 - 99 mg/dL 92  098  119   BUN 6 - 20 mg/dL 13  13  10    Creatinine 0.44 - 1.00 mg/dL 1.47  8.29  5.62   Sodium 135 - 145 mmol/L 142  144  143   Potassium 3.5 - 5.1 mmol/L 3.7  3.8  3.4   Chloride 98 - 111 mmol/L 109  108  110   CO2 22 - 32 mmol/L 27  29  27    Calcium 8.9 - 10.3 mg/dL 9.5  9.9  9.0   Total Protein 6.5 - 8.1 g/dL 7.4  7.7  7.1   Total Bilirubin 0.3 - 1.2 mg/dL 0.7  0.7  0.6   Alkaline Phos 38 - 126 U/L 73  72  61   AST 15 - 41 U/L 14  18  18    ALT 0 - 44 U/L 11  16  17      Lab Results  Component Value Date   WBC 4.2 06/22/2022   HGB 13.3 06/22/2022   HCT 39.3 06/22/2022   MCV 90.1 06/22/2022   PLT 154 06/22/2022   NEUTROABS 2.4 06/22/2022    ASSESSMENT & PLAN:  No problem-specific Assessment & Plan notes found for this encounter.    No orders of the defined types were placed in this encounter.  The patient has a good understanding of the overall plan. she  agrees with it. she will call with any problems that may develop before the next visit here. Total time spent: 30 mins including face to face time and time spent for planning, charting and co-ordination of care   Sherlyn Lick, CMA 12/10/22    I Janan Ridge am acting as a Neurosurgeon for [employer]  ***

## 2022-12-13 ENCOUNTER — Inpatient Hospital Stay: Attending: Hematology and Oncology

## 2022-12-13 ENCOUNTER — Inpatient Hospital Stay: Admitting: Hematology and Oncology

## 2022-12-13 VITALS — BP 136/72 | HR 70 | Temp 97.5°F | Resp 18 | Ht 66.5 in | Wt 146.2 lb

## 2022-12-13 DIAGNOSIS — Z7982 Long term (current) use of aspirin: Secondary | ICD-10-CM | POA: Diagnosis not present

## 2022-12-13 DIAGNOSIS — Z79810 Long term (current) use of selective estrogen receptor modulators (SERMs): Secondary | ICD-10-CM | POA: Insufficient documentation

## 2022-12-13 DIAGNOSIS — C50212 Malignant neoplasm of upper-inner quadrant of left female breast: Secondary | ICD-10-CM | POA: Insufficient documentation

## 2022-12-13 DIAGNOSIS — Z79899 Other long term (current) drug therapy: Secondary | ICD-10-CM | POA: Insufficient documentation

## 2022-12-13 DIAGNOSIS — Z170 Estrogen receptor positive status [ER+]: Secondary | ICD-10-CM | POA: Insufficient documentation

## 2022-12-13 DIAGNOSIS — E538 Deficiency of other specified B group vitamins: Secondary | ICD-10-CM | POA: Diagnosis not present

## 2022-12-13 LAB — CBC WITH DIFFERENTIAL (CANCER CENTER ONLY)
Abs Immature Granulocytes: 0.01 10*3/uL (ref 0.00–0.07)
Basophils Absolute: 0 10*3/uL (ref 0.0–0.1)
Basophils Relative: 0 %
Eosinophils Absolute: 0.1 10*3/uL (ref 0.0–0.5)
Eosinophils Relative: 1 %
HCT: 39.8 % (ref 36.0–46.0)
Hemoglobin: 13.8 g/dL (ref 12.0–15.0)
Immature Granulocytes: 0 %
Lymphocytes Relative: 32 %
Lymphs Abs: 1.2 10*3/uL (ref 0.7–4.0)
MCH: 31.4 pg (ref 26.0–34.0)
MCHC: 34.7 g/dL (ref 30.0–36.0)
MCV: 90.5 fL (ref 80.0–100.0)
Monocytes Absolute: 0.2 10*3/uL (ref 0.1–1.0)
Monocytes Relative: 5 %
Neutro Abs: 2.3 10*3/uL (ref 1.7–7.7)
Neutrophils Relative %: 62 %
Platelet Count: 156 10*3/uL (ref 150–400)
RBC: 4.4 MIL/uL (ref 3.87–5.11)
RDW: 13.3 % (ref 11.5–15.5)
WBC Count: 3.7 10*3/uL — ABNORMAL LOW (ref 4.0–10.5)
nRBC: 0 % (ref 0.0–0.2)

## 2022-12-13 LAB — CMP (CANCER CENTER ONLY)
ALT: 12 U/L (ref 0–44)
AST: 15 U/L (ref 15–41)
Albumin: 4.5 g/dL (ref 3.5–5.0)
Alkaline Phosphatase: 80 U/L (ref 38–126)
Anion gap: 7 (ref 5–15)
BUN: 11 mg/dL (ref 8–23)
CO2: 25 mmol/L (ref 22–32)
Calcium: 9.6 mg/dL (ref 8.9–10.3)
Chloride: 111 mmol/L (ref 98–111)
Creatinine: 0.69 mg/dL (ref 0.44–1.00)
GFR, Estimated: >60 mL/min (ref >60)
Glucose, Bld: 109 mg/dL — ABNORMAL HIGH (ref 70–99)
Potassium: 3.8 mmol/L (ref 3.5–5.1)
Sodium: 143 mmol/L (ref 135–145)
Total Bilirubin: 0.9 mg/dL (ref 0.3–1.2)
Total Protein: 7.5 g/dL (ref 6.5–8.1)

## 2022-12-13 LAB — VITAMIN B12: Vitamin B-12: 516 pg/mL (ref 180–914)

## 2022-12-13 MED ORDER — ALPRAZOLAM 0.25 MG PO TABS: 0.2500 mg | ORAL_TABLET | Freq: Every evening | ORAL | 0 refills | Status: DC | PRN

## 2022-12-13 NOTE — Assessment & Plan Note (Signed)
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  She is doing it at home.   Courtney Romero will continue on Lexapro that I prescribed.  Return to clinic in 6 months for follow-up and after that I can see her in 1 year

## 2022-12-14 ENCOUNTER — Ambulatory Visit: Admitting: Nurse Practitioner

## 2022-12-17 ENCOUNTER — Encounter: Payer: Self-pay | Admitting: Hematology and Oncology

## 2022-12-23 ENCOUNTER — Other Ambulatory Visit

## 2022-12-23 ENCOUNTER — Ambulatory Visit: Admitting: Hematology and Oncology

## 2023-01-04 ENCOUNTER — Encounter: Admitting: Gastroenterology

## 2023-01-19 ENCOUNTER — Other Ambulatory Visit: Payer: Self-pay | Admitting: Hematology and Oncology

## 2023-01-19 DIAGNOSIS — Z9889 Other specified postprocedural states: Secondary | ICD-10-CM

## 2023-01-24 ENCOUNTER — Encounter: Payer: Self-pay | Admitting: Adult Health

## 2023-01-31 ENCOUNTER — Ambulatory Visit: Admitting: Urology

## 2023-02-14 ENCOUNTER — Ambulatory Visit: Admitting: Urology

## 2023-03-03 ENCOUNTER — Encounter: Payer: Self-pay | Admitting: Nurse Practitioner

## 2023-03-03 ENCOUNTER — Ambulatory Visit: Attending: Nurse Practitioner | Admitting: Nurse Practitioner

## 2023-03-03 VITALS — BP 126/78 | HR 94 | Resp 16 | Ht 66.0 in | Wt 145.6 lb

## 2023-03-03 DIAGNOSIS — R931 Abnormal findings on diagnostic imaging of heart and coronary circulation: Secondary | ICD-10-CM | POA: Diagnosis not present

## 2023-03-03 DIAGNOSIS — R011 Cardiac murmur, unspecified: Secondary | ICD-10-CM

## 2023-03-03 DIAGNOSIS — I2510 Atherosclerotic heart disease of native coronary artery without angina pectoris: Secondary | ICD-10-CM | POA: Diagnosis not present

## 2023-03-03 DIAGNOSIS — E785 Hyperlipidemia, unspecified: Secondary | ICD-10-CM

## 2023-03-03 DIAGNOSIS — F419 Anxiety disorder, unspecified: Secondary | ICD-10-CM

## 2023-03-03 MED ORDER — BUSPIRONE HCL 5 MG PO TABS: ORAL_TABLET | ORAL | 3 refills | Status: DC

## 2023-03-03 NOTE — Patient Instructions (Signed)
Medication Instructions:   START Buspar one (1) or two (2) tablets by mouth ( 5 mg) daily as needed for anxiety.   *If you need a refill on your cardiac medications before your next appointment, please call your pharmacy*   Lab Work:  Your physician recommends that you return for a FASTING lipid profile in two weeks. You can come in anytime between 7:30 am -4:30 pm fasting from midnight the night before. Closed from 12:30-1:30 for lunch.    If you have labs (blood work) drawn today and your tests are completely normal, you will receive your results only by: MyChart Message (if you have MyChart) OR A paper copy in the mail If you have any lab test that is abnormal or we need to change your treatment, we will call you to review the results.   Testing/Procedures:  Your physician has requested that you have an echocardiogram. Echocardiography is a painless test that uses sound waves to create images of your heart. It provides your doctor with information about the size and shape of your heart and how well your heart's chambers and valves are working. This procedure takes approximately one hour. There are no restrictions for this procedure. Please do NOT wear cologne, perfume  or lotions (deodorant is allowed). Please arrive 15 minutes prior to your appointment time.  Please note: We ask at that you not bring children with you during ultrasound (echo/ vascular) testing. Due to room size and safety concerns, children are not allowed in the ultrasound rooms during exams. Our front office staff cannot provide observation of children in our lobby area while testing is being conducted. An adult accompanying a patient to their appointment will only be allowed in the ultrasound room at the discretion of the ultrasound technician under special circumstances. We apologize for any inconvenience.    Follow-Up: At [facility], you and your health needs are our priority.  As part of our  continuing mission to provide you with exceptional heart care, we have created designated Provider Care Teams.  These Care Teams include your primary Cardiologist (physician) and Advanced Practice Providers (APPs -  Physician Assistants and Nurse Practitioners) who all work together to provide you with the care you need, when you need it.  We recommend signing up for the patient portal called "MyChart".  Sign up information is provided on this After Visit Summary.  MyChart is used to connect with patients for Virtual Visits (Telemedicine).  Patients are able to view lab/test results, encounter notes, upcoming appointments, etc.  Non-urgent messages can be sent to your provider as well.   To learn more about what you can do with MyChart, go to [email or web address].    Your next appointment:   1 year(s)  Provider:   Donato Schultz, MD or Lebron Conners    Other Instructions  Your physician wants you to follow-up in: 1 year.  You will receive a reminder letter in the mail two months in advance. If you don't receive a letter, please call our office to schedule the follow-up appointment.

## 2023-03-03 NOTE — Progress Notes (Signed)
03/07/2023 11:18 AM   Courtney Romero 12/31/1960 [id number]  Referring provider: Lauro Regulus, MD [address] [place] [facility] [place],  [place] [address]  Urological history: 1. rUTI's -contributing factors of age, GSM and constipation -CT (06/3032) - no nidus for infection  -documented urine cultures over the last year   February 22, 2023 E. Coli  January 17, 2023 E. Coli  September 23, 2022 E. Coli  Aug 30, 2022 E. Coli  June 17, 2018 for Proteus mirabilis   May 17, 2022 E. Coli -Vaginal estrogen cream three nights weekly and nitrofurantoin 100 mg daily  Chief Complaint  Patient presents with   Recurrent UTI   HPI: Courtney Romero is a 62 y.o. female who presents today for follow up with her husband, Maisie Fus.    Previous records reviewed.   She is having 8 or more daytime voids, 1-2 episodes of nocturia with a mild urge to urinate.  She does not leak urine.  She does engage in toilet mapping.    Patient denies any modifying or aggravating factors.  Patient denies any gross hematuria, dysuria or suprapubic/flank pain.  Patient denies any fevers, chills, nausea or vomiting.    UA clear.    She has been mostly compliant with applying the vaginal estrogen cream.  She did have a urinary tract infection at the beginning of this month.  Her symptoms were pain after urination and suprapubic discomfort.  Her urine culture was positive for E. coli and she was placed on Macrobid for 7 days.  She did state the pain after urination abated, but the suprapubic discomfort remains.  PMH: Past Medical History:  Diagnosis Date   Abnormal CAT scan 2013   Anxiety    Cancer [facility])    Family history of breast cancer    Family history of pancreatic cancer    Family history of prostate cancer    GERD (gastroesophageal reflux disease)    Hepatitis C 2001   Followed by Dr. Esmond Harps at [facility]   History of radiation therapy 07/26/17- 08/22/17   40.05 Gy  directed to the left breast in 15 fractions followed by a boost of 10 Gy in 5 fractions.    Personal history of radiation therapy    PONV (postoperative nausea and vomiting)    Thyroid disease     Surgical History: Past Surgical History:  Procedure Laterality Date   ABDOMINAL HYSTERECTOMY  2009   breast cyst removal  1995   BREAST LUMPECTOMY Left    BREAST LUMPECTOMY WITH RADIOACTIVE SEED AND SENTINEL LYMPH NODE BIOPSY Left 06/14/2017   Procedure: LEFT BREAST SEED LOCALIZED LUMPECTOMY (2 SEEDS) AND SENTINEL LYMPH NODE BIOPSY;  Surgeon: Harriette Bouillon, MD;  Location:  SURGERY CENTER;  Service: General;  Laterality: Left;   COLONOSCOPY  2011   COLONOSCOPY WITH PROPOFOL N/A 12/12/2017   Procedure: COLONOSCOPY WITH PROPOFOL;  Surgeon: Wyline Mood, MD;  Location: [facility] ENDOSCOPY;  Service: Gastroenterology;  Laterality: N/A;   ESOPHAGOGASTRODUODENOSCOPY (EGD) WITH PROPOFOL N/A 12/12/2017   Procedure: ESOPHAGOGASTRODUODENOSCOPY (EGD) WITH PROPOFOL;  Surgeon: Wyline Mood, MD;  Location: [facility] ENDOSCOPY;  Service: Gastroenterology;  Laterality: N/A;   FLEXIBLE SIGMOIDOSCOPY  February 12, 2013   have normal perianal exam, question focal prolapse. 2 small scars in the rectum.   HEMORRHOID BANDING  2012   3 times over three months   LEFT HEART CATH AND CORONARY ANGIOGRAPHY N/A 08/27/2019   Procedure: LEFT HEART CATH AND CORONARY ANGIOGRAPHY;  Surgeon: Dolores Patty, MD;  Location: [facility] INVASIVE CV LAB;  Service: Cardiovascular;  Laterality: N/A;   SIGMOIDOSCOPY  2013   TONSILECTOMY/ADENOIDECTOMY WITH MYRINGOTOMY  remote   UPPER GI ENDOSCOPY  2013    Home Medications:  Allergies as of 03/07/2023       Reactions   Propoxyphene Rash   Uncoded Allergy. Allergen: mellaril, Other Reaction: Other reaction, double vision Uncoded Allergy. Allergen: mellaril, Other Reaction: Other reaction, double vision Uncoded Allergy. Allergen: mellaril, Other Reaction: Other reaction, double vision    Thioridazine Hcl    REACTION: Reaction not known   Bupropion Rash, Other (See Comments)   Insomnia Insomnia   Citalopram Rash, Other (See Comments)   Lightheaded Lightheaded   Duloxetine Rash, Other (See Comments)   Insomnia Insomnia   Thioridazine Rash   REACTION: Reaction not known REACTION: Reaction not known        Medication List        Accurate as of March 07, 2023 11:18 AM. If you have any questions, ask your nurse or doctor.          ALPRAZolam 0.25 MG tablet Commonly known as: XANAX Take 1 tablet (0.25 mg total) by mouth at bedtime as needed for anxiety.   aspirin EC 81 MG tablet Take 1 tablet (81 mg total) by mouth daily.   Ativan 0.5 MG tablet Generic drug: LORazepam Take 0.5 mg by mouth every 4 (four) hours as needed for anxiety.   busPIRone 5 MG tablet Commonly known as: BUSPAR Take one (1) or  two (2) tablets by mouth ( 5 mg) as needed for anxiety.   CALCIUM 600 PO Take 600 mg by mouth daily.   cholecalciferol 1000 units tablet Commonly known as: VITAMIN D Take 1,000 Units by mouth daily.   cyanocobalamin 1000 MCG/ML injection Commonly known as: VITAMIN B12 Inject 1 mL (1,000 mcg total) into the muscle every 30 (thirty) days.   estradiol 0.1 MG/GM vaginal cream Commonly known as: ESTRACE Estrogen Cream Instruction Discard applicator Apply pea sized amount to tip of finger to urethra before bed. Wash hands well after application. Use Monday, Wednesday and Friday   hydrocortisone 2.5 % rectal cream Commonly known as: Anusol-HC Place 1 Application rectally 2 (two) times daily.   melatonin 5 MG Tabs Take 5 mg by mouth at bedtime.   omeprazole 20 MG capsule Commonly known as: PRILOSEC Take 1 capsule (20 mg total) by mouth daily.   rosuvastatin 20 MG tablet Commonly known as: Crestor Take 1 tablet (20 mg total) by mouth daily.   zolpidem 5 MG tablet Commonly known as: AMBIEN Take 5 mg by mouth at bedtime as needed for sleep.         Allergies:  Allergies  Allergen Reactions   Propoxyphene Rash    Uncoded Allergy. Allergen: mellaril, Other Reaction: Other reaction, double vision Uncoded Allergy. Allergen: mellaril, Other Reaction: Other reaction, double vision Uncoded Allergy. Allergen: mellaril, Other Reaction: Other reaction, double vision   Thioridazine Hcl     REACTION: Reaction not known   Bupropion Rash and Other (See Comments)    Insomnia Insomnia   Citalopram Rash and Other (See Comments)    Lightheaded Lightheaded   Duloxetine Rash and Other (See Comments)    Insomnia Insomnia   Thioridazine Rash    REACTION: Reaction not known REACTION: Reaction not known    Family History: Family History  Problem Relation Age of Onset   Hypertension Mother    Thyroid disease Mother  Multi problems   Coronary artery disease Father    Heart failure Father    Atrial fibrillation Father    Hypertension Brother    Diabetes Brother    Heart attack Brother 32   Colon cancer Neg Hx    Esophageal cancer Neg Hx    Rectal cancer Neg Hx    Stomach cancer Neg Hx     Social History:  reports that she quit smoking about 24 years ago. Her smoking use included cigarettes. She started smoking about 44 years ago. She has a 30 pack-year smoking history. She has been exposed to tobacco smoke. She has never used smokeless tobacco. She reports that she does not drink alcohol and does not use drugs.  ROS: Pertinent ROS in HPI  Physical Exam: BP 134/76 (BP Location: Left Arm, Patient Position: Sitting, Cuff Size: Normal)   Pulse 79   Ht 5\' 6"  (1.676 m)   Wt 145 lb (65.8 kg)   BMI 23.40 kg/m   Constitutional:  Well nourished. Alert and oriented, No acute distress. HEENT: [place] AT, moist mucus membranes.  Trachea midline Cardiovascular: No clubbing, cyanosis, or edema. Respiratory: Normal respiratory effort, no increased work of breathing. Neurologic: Grossly intact, no focal deficits, moving all 4  extremities. Psychiatric: Normal mood and affect.    Laboratory Data: Lab Results  Component Value Date   WBC 3.7 (L) 12/13/2022   HGB 13.8 12/13/2022   HCT 39.8 12/13/2022   MCV 90.5 12/13/2022   PLT 156 12/13/2022    Lab Results  Component Value Date   CREATININE 0.69 12/13/2022   Lab Results  Component Value Date   AST 15 12/13/2022   Lab Results  Component Value Date   ALT 12 12/13/2022   Urinalysis See EPIC and HPI I have reviewed the labs.   Pertinent Imaging: N/A  Assessment & Plan:    1. rUTI's  - criteria for recurrent UTI has been met with 2 or more infections in 6 months or 3 or greater infections in one year  - patient is instructed to increase their water intake until the urine is pale yellow or clear (10 to 12 cups daily)  - patient is instructed to take cranberry pills and D-mannose -She still experiencing suprapubic pain and the urinalysis is bland -I will send for culture to rule out any indolent infection in the meantime recommend taking ibuprofen 2 mg twice daily to help with any postinflammatory cystitis  2. Vaginal Atrophy  -I explained to the patient that when women go through menopause and her estrogen levels are severely diminished, the normal vaginal flora will change.  This is due to an increase of the vaginal canal's pH. Because of this, the vaginal canal may be colonized by bacteria from the rectum instead of the protective lactobacillus.  This, accompanied by the loss of the mucus barrier with vaginal atrophy, is a cause of recurrent urinary tract infections. -In some studies, the use of vaginal estrogen cream has been demonstrated to reduce  recurrent urinary tract infections to one a year.  -continue the vaginal estrogen cream, applying it 3 nights weekly                                             Return for Follow up pending urine culture results .  These notes generated with voice recognition software. I apologize for typographical  errors.  Cloretta Ned  [facility] [address]  [address] [place], [place] [address] [phone]

## 2023-03-03 NOTE — Progress Notes (Signed)
Cardiology Office Note:  .   Date:  03/03/2023  ID:  ZAKKIYYA REAVES, DOB 1960-05-30, MRN [id number] PCP: Lauro Regulus, MD  [facility] Providers Cardiologist:  Donato Schultz, MD    Patient Profile: .      PMH Coronary artery disease Coronary CTA 07/2019 CAC score 566 (99th percentile), mainly mild stenosis but possible moderate grade stenosis in the proximal LAD at the takeoff of D2 No significant stenosis on FFR LHC 08/27/2019 Proximal RCA to mid RCA lesion 30% Proximal LAD to mid LAD lesion 20% LV EF 60 to 65% Aortic atherosclerosis Previous tobacco abuse Hepatitis C Breast cancer S/p lumpectomy 05/2017 XRT Peripheral vascular disease Family history early CAD Father MI at age 65 (later had CABG and pacemaker, alive in his 48s) Brother - 2 stents age 47  Initially referred to cardiology for evaluation of exertional dyspnea and palpitations and seen by Dr. Kary Kos 05/30/2018.  She had previously been seen in 2010 for dyspnea by Dr. Gala Romney with echocardiogram which showed normal EF and no evidence of pulmonary hypertension.  NST showed no evidence of ischemia.  Stress echocardiogram at [facility] in 2013 was unremarkable.  CT of chest 10/2018 notable for three-vessel coronary plaque and she was seen again by Dr. Gala Romney on 06/28/2019.  Coronary CTA 07/2019 revealed calcium score of 566, 99th percentile, mainly mild stenosis but possible moderate stenosis in the proximal LAD at the takeoff of D2.  FFR did not reveal segment stenosis but she ultimately underwent LHC on 08/27/2019 which revealed RCA lesion 30%, LAD lesion 20%, and normal LVEF.  She continued to have chest pain almost daily.  Pain could last a few minutes.  Seen by GI for epigastric discomfort.  Screening of thyroid biopsy was benign.  She continued to walk for exercise without dyspnea, palpitations, or syncope.  Last cardiology clinic visit was 12/07/2021 with me.  Lipids were well-controlled.  Chest pain  remained about the same.  No concerns for unstable angina.  1 year follow-up was recommended.        History of Present Illness: .   COURTNEI GIORNO is a very pleasant 62 y.o. female who is here today for follow-up and is accompanied by her husband. Reports feeling a difference since the cessation of B12 shots three months ago and is awaiting further evaluation. When asked about chest pain that was described in the past, she reports she does not notice it often. Her husband says she still has it. Notes occasional heart flutters but denies any significant chest pain or shortness of breath. History of anxiety and is currently taking Ativan as needed, sometimes daily. She has tried several medications for anxiety in the past but experienced adverse reactions. She reports feeling very tired daily, which may be related to B12 levels or poor sleep quality. Her husband notes that she often takes Ativan in the middle of the night, leading to morning tiredness. She remains active.  She denies shortness of breath, orthopnea, PND, edema, presyncope, syncope.  Discussed the use of AI scribe software for clinical note transcription with the patient, who gave verbal consent to proceed.   ROS: See HPI       Studies Reviewed: Marland Kitchen   EKG Interpretation Date/Time:  Thursday March 03 2023 09:16:03 EST Ventricular Rate:  72 PR Interval:  174 QRS Duration:  76 QT Interval:  390 QTC Calculation: 427 R Axis:   -7  Text Interpretation: Normal sinus rhythm Cannot rule out Anterior infarct , age  undetermined When compared with ECG of 27-Aug-2019 06:26, No significant change was found Confirmed by Eligha Bridegroom [phone]) on 03/03/2023 9:25:49 AM    Risk Assessment/Calculations:             Physical Exam:   VS:  BP 126/78 (BP Location: Right Arm, Patient Position: Sitting, Cuff Size: Normal)   Pulse 94   Resp 16   Ht 5\' 6"  (1.676 m)   Wt 145 lb 9.6 oz (66 kg)   SpO2 98%   BMI 23.50 kg/m    Wt Readings  from Last 3 Encounters:  03/03/23 145 lb 9.6 oz (66 kg)  12/13/22 146 lb 3.2 oz (66.3 kg)  11/11/22 146 lb 6 oz (66.4 kg)    GEN: Well nourished, well developed in no acute distress NECK: No JVD; No carotid bruits CARDIAC: RRR, soft systolic murmur RUSB. No rubs, gallops RESPIRATORY:  Clear to auscultation without rales, wheezing or rhonchi  ABDOMEN: Soft, non-tender, non-distended EXTREMITIES:  No edema; No deformity     ASSESSMENT AND PLAN: .    CAD without angina: Nonobstructive CAD on cath 2021. She continues to have chest pain and occasional palpitations atypical for angina. She thinks symptoms may be 2/2 anxiety. No shortness of breath.  No indication for further ischemia evaluation.  No bleeding concerns. We will update lipid panel when she can return fasting. Continue aspirin, and rosuvastatin.  Anxiety: Reports of chest discomfort likely secondary to anxiety. Currently on Ativan as needed, with inconsistent use. Discussed potential for buspirone as a more consistent management strategy, but patient has history of adverse reactions to multiple SSRIs. Consider trial of buspirone 5mg  daily, with potential for twice daily dosing if needed. Encouraged weaning off Ativan, using only for acute episodes.  Hyperlipidemia: Lipid panel 12/07/2021 with total cholesterol 139, LDL-C 49, HDL 65, triglycerides 153. She was concerned about mildly elevated triglycerides. She limits her intake of saturated fat, sugar and other simple carbohydrates, no Etoh. We will recheck lipid panel when she can return fasting.  Continue rosuvastatin.  Murmur: New cardiac murmur noted on exam. She is asymptomatic. No evidence of valve disease on echo 06/2018. We will get echocardiogram to assess valve function and potential etiology of murmur.        Dispo: 1 year with Dr. Anne Fu or me  Signed, Eligha Bridegroom, NP-C

## 2023-03-04 LAB — LIPID PANEL
Chol/HDL Ratio: 2 ratio (ref 0.0–4.4)
Cholesterol, Total: 137 mg/dL (ref 100–199)
HDL: 68 mg/dL (ref >39)
LDL Chol Calc (NIH): 52 mg/dL (ref 0–99)
Triglycerides: 93 mg/dL (ref 0–149)
VLDL Cholesterol Cal: 17 mg/dL (ref 5–40)

## 2023-03-07 ENCOUNTER — Telehealth: Payer: Self-pay | Admitting: Urology

## 2023-03-07 ENCOUNTER — Encounter: Payer: Self-pay | Admitting: Urology

## 2023-03-07 ENCOUNTER — Other Ambulatory Visit
Admission: RE | Admit: 2023-03-07 | Discharge: 2023-03-07 | Disposition: A | Discharge status: Home / Self Care | Attending: Urology | Admitting: Urology

## 2023-03-07 ENCOUNTER — Ambulatory Visit: Admitting: Urology

## 2023-03-07 VITALS — BP 134/76 | HR 79 | Ht 66.0 in | Wt 145.0 lb

## 2023-03-07 DIAGNOSIS — N952 Postmenopausal atrophic vaginitis: Secondary | ICD-10-CM

## 2023-03-07 DIAGNOSIS — Z87440 Personal history of urinary (tract) infections: Secondary | ICD-10-CM | POA: Diagnosis present

## 2023-03-07 DIAGNOSIS — N390 Urinary tract infection, site not specified: Secondary | ICD-10-CM

## 2023-03-07 LAB — URINALYSIS, COMPLETE (UACMP) WITH MICROSCOPIC
Bilirubin Urine: NEGATIVE
Glucose, UA: NEGATIVE mg/dL
Hgb urine dipstick: NEGATIVE
Ketones, ur: NEGATIVE mg/dL
Leukocytes,Ua: NEGATIVE
Nitrite: NEGATIVE
Protein, ur: NEGATIVE mg/dL
RBC / HPF: NONE SEEN RBC/hpf (ref 0–5)
Specific Gravity, Urine: 1.01 (ref 1.005–1.030)
pH: 6.5 (ref 5.0–8.0)

## 2023-03-07 MED ORDER — ROSUVASTATIN CALCIUM 20 MG PO TABS: 20.0000 mg | ORAL_TABLET | Freq: Every day | ORAL | 3 refills | Status: DC

## 2023-03-08 LAB — URINE CULTURE: Culture: <10000 — AB

## 2023-03-14 ENCOUNTER — Inpatient Hospital Stay: Attending: Hematology and Oncology

## 2023-03-14 ENCOUNTER — Ambulatory Visit (HOSPITAL_BASED_OUTPATIENT_CLINIC_OR_DEPARTMENT_OTHER)

## 2023-03-14 DIAGNOSIS — C50212 Malignant neoplasm of upper-inner quadrant of left female breast: Secondary | ICD-10-CM | POA: Insufficient documentation

## 2023-03-14 DIAGNOSIS — I2510 Atherosclerotic heart disease of native coronary artery without angina pectoris: Secondary | ICD-10-CM | POA: Insufficient documentation

## 2023-03-14 DIAGNOSIS — R931 Abnormal findings on diagnostic imaging of heart and coronary circulation: Secondary | ICD-10-CM | POA: Diagnosis not present

## 2023-03-14 DIAGNOSIS — E538 Deficiency of other specified B group vitamins: Secondary | ICD-10-CM

## 2023-03-14 DIAGNOSIS — R011 Cardiac murmur, unspecified: Secondary | ICD-10-CM | POA: Diagnosis not present

## 2023-03-14 DIAGNOSIS — E785 Hyperlipidemia, unspecified: Secondary | ICD-10-CM | POA: Insufficient documentation

## 2023-03-14 LAB — ECHOCARDIOGRAM COMPLETE
Area-P 1/2: 3.4 cm2
S' Lateral: 2.7 cm

## 2023-03-14 LAB — VITAMIN B12: Vitamin B-12: 300 pg/mL (ref 180–914)

## 2023-03-15 ENCOUNTER — Encounter: Payer: Self-pay | Admitting: Hematology and Oncology

## 2023-03-15 ENCOUNTER — Inpatient Hospital Stay

## 2023-03-23 ENCOUNTER — Encounter: Payer: Self-pay | Admitting: Gastroenterology

## 2023-03-24 ENCOUNTER — Other Ambulatory Visit: Payer: Self-pay

## 2023-03-24 MED ORDER — OMEPRAZOLE 20 MG PO CPDR: 20.0000 mg | DELAYED_RELEASE_CAPSULE | Freq: Every day | ORAL | 1 refills | Status: AC

## 2023-04-11 ENCOUNTER — Other Ambulatory Visit (HOSPITAL_COMMUNITY)

## 2023-04-21 ENCOUNTER — Encounter: Payer: Self-pay | Admitting: Hematology and Oncology

## 2023-04-21 ENCOUNTER — Ambulatory Visit
Admission: RE | Admit: 2023-04-21 | Discharge: 2023-04-21 | Disposition: A | Discharge status: Home / Self Care | Source: Ambulatory Visit | Attending: Hematology and Oncology | Admitting: Hematology and Oncology

## 2023-04-21 DIAGNOSIS — Z9889 Other specified postprocedural states: Secondary | ICD-10-CM

## 2023-06-13 ENCOUNTER — Telehealth: Payer: Self-pay | Admitting: Hematology and Oncology

## 2023-06-13 NOTE — Telephone Encounter (Signed)
 Canceled appointments per patients request via incoming call. Patient is aware of the changes made to her upcoming appointments.

## 2023-06-15 ENCOUNTER — Inpatient Hospital Stay

## 2023-06-16 ENCOUNTER — Inpatient Hospital Stay: Admitting: Hematology and Oncology

## 2023-06-17 ENCOUNTER — Inpatient Hospital Stay: Admitting: Hematology and Oncology

## 2023-08-08 ENCOUNTER — Encounter: Payer: Self-pay | Admitting: Dermatology

## 2023-08-08 ENCOUNTER — Ambulatory Visit: Admitting: Dermatology

## 2023-08-08 VITALS — BP 141/85 | HR 68

## 2023-08-08 DIAGNOSIS — L570 Actinic keratosis: Secondary | ICD-10-CM | POA: Diagnosis not present

## 2023-08-08 DIAGNOSIS — L309 Dermatitis, unspecified: Secondary | ICD-10-CM

## 2023-08-08 DIAGNOSIS — D485 Neoplasm of uncertain behavior of skin: Secondary | ICD-10-CM

## 2023-08-08 DIAGNOSIS — R21 Rash and other nonspecific skin eruption: Secondary | ICD-10-CM

## 2023-08-08 DIAGNOSIS — D492 Neoplasm of unspecified behavior of bone, soft tissue, and skin: Secondary | ICD-10-CM

## 2023-08-08 NOTE — Progress Notes (Signed)
   New Patient Visit   Subjective  Courtney Romero is a 63 y.o. female who presents for the following: concerned about a rash. Referred by Cornelius Dill, previously saw Cornelius Dill and Dr. Tresa Frohlich at [facility] for a persistent itchy rash on her left arm, present for several years. Previously biopsied and showed "thickened skin". Has previously used clobetasol without much help.   The patient has spots, moles and lesions to be evaluated, some may be new or changing and the patient may have concern these could be cancer.   The following portions of the chart were reviewed this encounter and updated as appropriate: medications, allergies, medical history  Review of Systems:  No other skin or systemic complaints except as noted in HPI or Assessment and Plan.  Objective  Well appearing patient in no apparent distress; mood and affect are within normal limits.   A focused examination was performed of the following areas: Left arm  Relevant exam findings are noted in the Assessment and Plan.  Left Upper Arm - Anterior 6 mm hyperkeratotic pink papule  Left Upper Arm - Inferior Faint monomorphic erythematous papules and plaques with excoritations  Assessment & Plan   NEOPLASM OF UNCERTAIN BEHAVIOR OF SKIN (2) Left Upper Arm - Anterior Skin / nail biopsy Type of biopsy: tangential   Informed consent: discussed and consent obtained   Timeout: patient name, date of birth, surgical site, and procedure verified   Procedure prep:  Patient was prepped and draped in usual sterile fashion Prep type:  Isopropyl alcohol Anesthesia: the lesion was anesthetized in a standard fashion   Anesthetic:  1% lidocaine  w/ epinephrine  1-100,000 buffered w/ 8.4% NaHCO3 Instrument used: DermaBlade   Hemostasis achieved with: aluminum chloride   Outcome: patient tolerated procedure well   Post-procedure details: sterile dressing applied and wound care instructions given   Specimen 1 - Surgical  pathology Differential Diagnosis: r/o NMSC vs prurigo vs other  Check Margins: No Left Upper Arm - Inferior Skin / nail biopsy Type of biopsy: punch   Informed consent: discussed and consent obtained   Timeout: patient name, date of birth, surgical site, and procedure verified   Procedure prep:  Patient was prepped and draped in usual sterile fashion Prep type:  Isopropyl alcohol Anesthesia: the lesion was anesthetized in a standard fashion   Anesthetic:  1% lidocaine  w/ epinephrine  1-100,000 buffered w/ 8.4% NaHCO3 Punch size:  4 mm Suture size:  5-0 Suture type: fast-absorbing plain gut   Hemostasis achieved with: pressure and aluminum chloride   Outcome: patient tolerated procedure well   Post-procedure details: sterile dressing applied and wound care instructions given   Dressing type: bandage, petrolatum  and pressure dressing   Specimen 2 - Surgical pathology Differential Diagnosis: r/o contact dermatitis vs eczema vs other  Check Margins: No  No follow-ups on file.  I, Haig Levan, Surg Tech III, am acting as scribe for Deneise Finlay, MD.   Documentation: I have reviewed the above documentation for accuracy and completeness, and I agree with the above.  Deneise Finlay, MD

## 2023-08-08 NOTE — Patient Instructions (Signed)

## 2023-08-29 ENCOUNTER — Other Ambulatory Visit: Payer: Self-pay | Admitting: Dermatology

## 2023-08-29 DIAGNOSIS — L308 Other specified dermatitis: Secondary | ICD-10-CM

## 2023-08-29 MED ORDER — TRIAMCINOLONE ACETONIDE 0.1 % EX OINT
1.0000 | TOPICAL_OINTMENT | Freq: Two times a day (BID) | CUTANEOUS | 0 refills | Status: DC
Start: 2023-08-29 — End: 2024-01-24

## 2023-08-30 ENCOUNTER — Telehealth: Payer: Self-pay | Admitting: Dermatology

## 2023-08-30 NOTE — Telephone Encounter (Signed)
 Called and went over results and rx that was sent in

## 2023-08-30 NOTE — Telephone Encounter (Signed)
-----   Message from [facility] PACI sent at 08/29/2023  4:13 PM EDT ----- Regarding: Results 1. Left upper arm- Lichenoid Keratosis- benign 2. Left upper arm anterior- Spongiotic Dermatitis- likely contact or nummular dermatitis, recommend triamcinolone ointment BID, will send to pharmacy

## 2023-09-01 NOTE — Telephone Encounter (Signed)
 Error

## 2023-10-30 ENCOUNTER — Encounter: Payer: Self-pay | Admitting: Emergency Medicine

## 2023-10-30 ENCOUNTER — Ambulatory Visit
Admission: EM | Admit: 2023-10-30 | Discharge: 2023-10-30 | Disposition: A | Discharge status: Home / Self Care | Attending: Family Medicine | Admitting: Family Medicine

## 2023-10-30 DIAGNOSIS — N3001 Acute cystitis with hematuria: Secondary | ICD-10-CM | POA: Diagnosis present

## 2023-10-30 LAB — URINALYSIS, W/ REFLEX TO CULTURE (INFECTION SUSPECTED)
Bilirubin Urine: NEGATIVE
Glucose, UA: NEGATIVE mg/dL
Ketones, ur: NEGATIVE mg/dL
Nitrite: NEGATIVE
Protein, ur: NEGATIVE mg/dL
Specific Gravity, Urine: 1.01 (ref 1.005–1.030)
WBC, UA: >50 WBC/hpf (ref 0–5)
pH: 6.5 (ref 5.0–8.0)

## 2023-10-30 MED ORDER — CEFDINIR 300 MG PO CAPS: 300.0000 mg | ORAL_CAPSULE | Freq: Two times a day (BID) | ORAL | 0 refills | Status: AC

## 2023-10-30 NOTE — ED Provider Notes (Signed)
 [facility]    CSN: [id number] Arrival date & time: 10/30/23  1026      History   Chief Complaint Chief Complaint  Patient presents with   Dysuria     HPI HPI Courtney Romero is a 63 y.o. female.    Courtney Romero presents for urinary frequency and dysuria that started this morning. Has history recurrent UTIs. Her last UTI was a couple weeks ago.  Follows with [facility].  She uses topical estrogen and cranberry pills.     SABRA Quant *** prior to arrival.  Has *** not had any antibiotics in last 30 days.   Denies known STI exposure.  Hadeel does ***not use condoms regularly. She is *** not currently pregnant.  No LMP recorded. Patient has had a hysterectomy.    - Abnormal vaginal discharge: no - vaginal odor: no - vaginal bleeding: no - Dysuria:yes  - Hematuria: no - Urinary urgency: yes  - Urinary frequency: yes   - Fever: no - Abdominal pain: no  - Pelvic pain: no - Rash/Skin lesions/mouth ulcers: no - Nausea: no  - Vomiting: no  - Back Pain: no        Past Medical History:  Diagnosis Date   Abnormal CAT scan 2013   Anxiety    Cancer [facility])    Family history of breast cancer    Family history of pancreatic cancer    Family history of prostate cancer    GERD (gastroesophageal reflux disease)    Hepatitis C 2001   Followed by Dr. Prentice Due at [facility]   History of radiation therapy 07/26/17- 08/22/17   40.05 Gy directed to the left breast in 15 fractions followed by a boost of 10 Gy in 5 fractions.    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/2017   Hepatitis C, chronic (HCC) 09/21/2016   Health care maintenance 07/29/2015   Menopausal disorder 01/20/2014   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   Rectal pain 05/31/2013   Hemorrhoids 05/31/2013   Rectal fissure 11/20/2012   Small intestinal bacterial overgrowth 10/11/2012   Bloating 10/03/2012   Nausea 10/03/2012   Anal pain 05/09/2012   Anxiety and depression 04/04/2012   Diverticulosis 03/21/2012   SHORTNESS OF BREATH 01/08/2009    Past Surgical History:  Procedure Laterality Date   ABDOMINAL HYSTERECTOMY  2009   breast cyst removal  1995   BREAST LUMPECTOMY Left    BREAST LUMPECTOMY WITH RADIOACTIVE SEED AND SENTINEL LYMPH NODE BIOPSY Left 06/14/2017   Procedure: LEFT BREAST SEED LOCALIZED LUMPECTOMY (2 SEEDS) AND SENTINEL LYMPH NODE BIOPSY;  Surgeon: Vanderbilt Ned, MD;  Location: [facility];  Service: General;  Laterality: Left;   COLONOSCOPY  2011   COLONOSCOPY WITH PROPOFOL  N/A 12/12/2017   Procedure: COLONOSCOPY WITH PROPOFOL ;  Surgeon: Therisa Bi, MD;  Location: [facility]COPY;  Service: Gastroenterology;  Laterality: N/A;   ESOPHAGOGASTRODUODENOSCOPY (EGD) WITH PROPOFOL  N/A  12/12/2017   Procedure: ESOPHAGOGASTRODUODENOSCOPY (EGD) WITH PROPOFOL ;  Surgeon: Therisa Bi, MD;  Location: [facility] ENDOSCOPY;  Service: Gastroenterology;  Laterality: N/A;   FLEXIBLE SIGMOIDOSCOPY  February 12, 2013   have normal perianal exam, question focal prolapse. 2 small scars in the rectum.   HEMORRHOID BANDING  2012   3 times over three months   LEFT HEART CATH AND CORONARY ANGIOGRAPHY N/A 08/27/2019   Procedure: LEFT HEART CATH AND CORONARY ANGIOGRAPHY;  Surgeon: Cherrie Toribio SAUNDERS, MD;  Location: [facility];  Service: Cardiovascular;  Laterality: N/A;   SIGMOIDOSCOPY  2013   TONSILECTOMY/ADENOIDECTOMY WITH MYRINGOTOMY   remote   UPPER GI ENDOSCOPY  2013    OB History     Gravida  0   Para      Term      Preterm      AB      Living  0      SAB      IAB      Ectopic      Multiple      Live Births           Obstetric Comments  1st Menstrual Cycle: 14          Home Medications    Prior to Admission medications   Medication Sig Start Date End Date Taking? Authorizing Provider  ALPRAZolam  (XANAX ) 0.25 MG tablet Take 1 tablet (0.25 mg total) by mouth at bedtime as needed for anxiety. 12/13/22  Yes Odean Potts, MD  aspirin  EC 81 MG tablet Take 1 tablet (81 mg total) by mouth daily. 04/02/20  Yes Bensimhon, Toribio SAUNDERS, MD  Calcium  Carbonate (CALCIUM  600 PO) Take 600 mg by mouth daily.    Yes [provider]  cholecalciferol (VITAMIN D ) 1000 units tablet Take 1,000 Units by mouth daily.   Yes [provider]  cyanocobalamin  (VITAMIN B12) 1000 MCG/ML injection Inject 1 mL (1,000 mcg total) into the muscle every 30 (thirty) days. 12/22/21  Yes Gudena, Vinay, MD  estradiol  (ESTRACE ) 0.1 MG/GM vaginal cream Estrogen Cream Instruction Discard applicator Apply pea sized amount to tip of finger to urethra before bed. Wash hands well after application. Use Monday, Wednesday and Friday 09/28/22  Yes [name], MD  gabapentin  (NEURONTIN ) 100 MG capsule Take 100 mg by mouth. 09/13/23 09/12/24 Yes [provider]  LORazepam  (ATIVAN ) 0.5 MG tablet Take 0.5 mg by mouth every 4 (four) hours as needed for anxiety.   Yes [provider]  Melatonin 5 MG TABS Take 5 mg by mouth at bedtime.    Yes [provider]  omeprazole  (PRILOSEC) 20 MG capsule Take 1 capsule (20 mg total) by mouth daily. 03/24/23  Yes Armbruster, Elspeth SQUIBB, MD  rosuvastatin  (CRESTOR ) 20 MG tablet Take 1 tablet (20 mg total) by mouth daily. 03/07/23  Yes Swinyer, Rosaline HERO, NP  busPIRone  (BUSPAR ) 5 MG tablet Take one (1) or  two (2) tablets by mouth ( 5 mg) as needed for anxiety. 03/03/23    Swinyer, Rosaline HERO, NP  hydrocortisone  (ANUSOL -HC) 2.5 % rectal cream Place 1 Application rectally 2 (two) times daily. 03/04/22   Craig Alan SAUNDERS, PA-C  triamcinolone  ointment (KENALOG ) 0.1 % Apply 1 Application topically 2 (two) times daily. 08/29/23   Paci, Karina M, MD  zolpidem (AMBIEN) 5 MG tablet Take 5 mg by mouth at bedtime as needed for sleep.  02/06/15 09/14/22  [provider]    Family History Family History  Problem Relation  Age of Onset   Hypertension Mother    Thyroid  disease Mother        Multi problems   Coronary artery disease Father    Heart failure Father    Atrial fibrillation Father    Hypertension Brother    Diabetes Brother    Heart attack Brother 79   Colon cancer Neg Hx    Esophageal cancer Neg Hx    Rectal cancer Neg Hx    Stomach cancer Neg Hx     Social History Social History   Tobacco Use   Smoking status: Former    Current packs/day: 0.00    Average packs/day: 1.5 packs/day for 20.0 years (30.0 ttl pk-yrs)    Types: Cigarettes    Start date: 04/19/1978    Quit date: 04/19/1998    Years since quitting: 25.5    Passive exposure: Past   Smokeless tobacco: Never   Tobacco comments:    quit smoking in 2000  Vaping Use   Vaping status: Never Used  Substance Use Topics   Alcohol use: No   Drug use: No     Allergies   Propoxyphene, Thioridazine hcl, Bupropion, Citalopram, Duloxetine, and Thioridazine   Review of Systems Review of Systems: :negative unless otherwise stated in HPI.      Physical Exam Triage Vital Signs ED Triage Vitals  Encounter Vitals Group     BP 10/30/23 1117 126/87     Girls Systolic BP Percentile --      Girls Diastolic BP Percentile --      Boys Systolic BP Percentile --      Boys Diastolic BP Percentile --      Pulse Rate 10/30/23 1117 75     Resp 10/30/23 1117 16     Temp 10/30/23 1117 98.1 F (36.7 C)     Temp Source 10/30/23 1117 Oral     SpO2 10/30/23 1117 97 %     Weight 10/30/23 1115 145  lb 1 oz (65.8 kg)     Height 10/30/23 1115 5' 6 (1.676 m)     Head Circumference --      Peak Flow --      Pain Score 10/30/23 1115 0     Pain Loc --      Pain Education --      Exclude from Growth Chart --    No data found.  Updated Vital Signs BP 126/87 (BP Location: Right Arm)   Pulse 75   Temp 98.1 F (36.7 C) (Oral)   Resp 16   Ht 5' 6 (1.676 m)   Wt 65.8 kg   SpO2 97%   BMI 23.41 kg/m   Visual Acuity Right Eye Distance:   Left Eye Distance:   Bilateral Distance:    Right Eye Near:   Left Eye Near:    Bilateral Near:     Physical Exam GEN: well appearing female in no acute distress  CVS: well perfused  RESP: speaking in full sentences without pause  ABD: soft, non-tender, non-distended, no palpable masses ***  GU: deferred, patient performed self swab  ***Pelvic exam: normal external genitalia, vulva, VAGINA and CERVIX: {details:315904::normal appearing cervix without discharge or lesions}, ADNEXA: normal adnexa in size, nontender and no masses, WET MOUNT done - results: {:[id number]}, exam chaperoned by CMA.    UC Treatments / Results  Labs (all labs ordered are listed, but only abnormal results are displayed) Labs Reviewed  URINALYSIS, W/ REFLEX TO CULTURE (INFECTION SUSPECTED) - Abnormal; Notable  for the following components:      Result Value   Color, Urine STRAW (*)    APPearance HAZY (*)    Hgb urine dipstick SMALL (*)    Leukocytes,Ua MODERATE (*)    Bacteria, UA MANY (*)    All other components within normal limits  URINE CULTURE    EKG   Radiology No results found.  Procedures Procedures (including critical care time)  Medications Ordered in UC Medications - No data to display  Initial Impression / Assessment and Plan / UC Course  I have reviewed the triage vital signs and the nursing notes.  Pertinent labs & imaging results that were available during my care of the patient were reviewed by me and considered in my medical decision  making (see chart for details).     *** Patient is a 63 y.o.SABRA female  who presents for **.  Overall patient is well-appearing and afebrile.  Vital signs stable.  UA ***consistent with acute cystitis.   Hematuria ***supported on microscopy.  Treat with ***Macrobid  2 times daily for 5 days. Urine culture obtained.***  Follow-up sensitivities and change antibiotics, if needed.  ***Vaginal swab for yeast vaginitis and bacterial vaginitis, trichomonas, gonorrhea and chlamydia obtained.   - Treatment:***Macrobid  twice daily for 5 days.  Flagyl 500 BID x 7 days and advised patient to not drink alcohol while taking this medication.  - ***Diflucan  for 2 doses for prevention of*** yeast infection  - ***Abstain from coitus during course of treatment.  - *** G/C Chlamydia and call in Rx if positive.    Return precautions including abdominal pain, fever, chills, nausea, or vomiting given. Discussed MDM, treatment plan and plan for follow-up with patient ***who agrees with plan.    STI Screening *** GC and chlamydia DNA  probe sent to lab. HIV and RPR recommended and ***declined***collected. ***Negative Trichomoniasis on wet prep.  - Treatment: ***500 mg IM CTX, po doxycycline 100 mg BID x7 days - Abstain from coitus during course of treatment - Advised patient to use barrier protection/condoms.  - F/U if symptoms not improving or getting worse.  - Safe sex handout given.     ***Acute cystitis:  Patient is a 63 y.o. female  who presents for ***days of ***dysuria and ***urinary urgency.  Overall patient is well-appearing and afebrile.  Vital signs stable.  UA consistent with ***acute cystitis.  Hematuria not ***supported on microscopy.  Patient has *** UTI in the past year. Treat with ***Macrobid  2 times daily for 5 days.  ***Urine culture obtained.  Follow-up sensitivities and change antibiotics, if needed.   - Return precautions including abdominal pain, fever, chills, nausea, or vomiting given.  Follow-up,  if symptoms not improving or getting worse. Discussed MDM, treatment plan and plan for follow-up with patient***who agrees with plan.        Final Clinical Impressions(s) / UC Diagnoses   Final diagnoses:  None   Discharge Instructions   None    ED Prescriptions   None    PDMP not reviewed this encounter.

## 2023-10-30 NOTE — ED Triage Notes (Signed)
 Pt c/o dysuria, and urinary frequency. Started this morning. Denies vaginal discharge, fever, back pain or pelvic pain.

## 2023-10-30 NOTE — Discharge Instructions (Addendum)
Stop by the pharmacy to pick up your prescriptions.  Follow up with your urologist.

## 2023-11-01 ENCOUNTER — Ambulatory Visit (HOSPITAL_COMMUNITY): Payer: Self-pay

## 2023-11-01 LAB — URINE CULTURE: Culture: 40000 — AB

## 2023-11-14 ENCOUNTER — Ambulatory Visit: Admitting: Dermatology

## 2024-01-11 ENCOUNTER — Ambulatory Visit: Admitting: Dermatology

## 2024-01-19 ENCOUNTER — Other Ambulatory Visit (INDEPENDENT_AMBULATORY_CARE_PROVIDER_SITE_OTHER): Payer: Self-pay | Admitting: Student

## 2024-01-19 DIAGNOSIS — G629 Polyneuropathy, unspecified: Secondary | ICD-10-CM

## 2024-01-20 ENCOUNTER — Encounter (INDEPENDENT_AMBULATORY_CARE_PROVIDER_SITE_OTHER)

## 2024-01-23 ENCOUNTER — Ambulatory Visit (INDEPENDENT_AMBULATORY_CARE_PROVIDER_SITE_OTHER)

## 2024-01-23 DIAGNOSIS — G629 Polyneuropathy, unspecified: Secondary | ICD-10-CM | POA: Diagnosis not present

## 2024-01-24 ENCOUNTER — Encounter: Payer: Self-pay | Admitting: Ophthalmology

## 2024-01-25 NOTE — Discharge Instructions (Signed)
 INSTRUCTIONS FOLLOWING OCULOPLASTIC SURGERY AMY EMERSON GAY, MD  AFTER YOUR EYE SURGERY, THER ARE MANY THINGS WHICH YOU, THE PATIENT, CAN DO TO ASSURE THE BEST POSSIBLE RESULT FROM YOUR OPERATION.  THIS SHEET SHOULD BE REFERRED TO WHENEVER QUESTIONS ARISE.  IF THERE ARE ANY QUESTIONS NOT ANSWERED HERE, DO NOT HESITATE TO CALL OUR OFFICE AT [phone] OR [phone].  THERE IS ALWAYS SOMEONE AVAILABLE TO CALL IF QUESTIONS OR PROBLEMS ARISE.  VISION: Your vision may be blurred and out of focus after surgery until you are able to stop using your ointment, swelling resolves and your eye(s) heal. This may take 1 to 2 weeks at the least.  If your vision becomes gradually more dim or dark, this is not normal and you need to call our office immediately.  EYE CARE: For the first 48 hours after surgery, use ice packs frequently - "20 minutes on, 20 minutes off" - to help reduce swelling and bruising.  Small bags of frozen peas or corn make good ice packs along with cloths soaked in ice water.  If you are wearing a patch or other type of dressing following surgery, keep this on for the amount of time specified by your doctor.  For the first week following surgery, you will need to treat your stitches with great care.  It is OK to shower, but take care to not allow soapy water to run into your eye(s) to help reduce chances of infection.  You may gently clean the eyelashes and around the eye(s) with cotton balls and bottled water, BUT DO NOT RUB THE STITCHES VIGOROUSLY.  Keeping your stitches moist with ointment will help promote healing with minimal scar formation.  ACTIVITY: When you leave the surgery center, you should go home, rest and be inactive.  The eye(s) may feel scratchy and keeping the eyes closed will allow for faster healing.  The first week following surgery, avoid straining (anything making the face turn red) or lifting over 20 pounds.  Additionally, avoid bending which causes your head to go below  your waist.  Using your eyes will NOT harm them, so feel free to read, watch television, use the computer, etc as desired.  Driving depends on each individual, so check with your doctor if you have questions about driving. Do not wear contact lenses for about 2 weeks.  Do not wear eye makeup for 2 weeks.  Avoid swimming, hot tubs, gardening, and dusting for 1 to 2 weeks to reduce the risk of an infection.  MEDICATIONS:  You will be given a prescription for an ointment to use 4 times a day on your stitches.  You can use the ointment in your eyes if they feel scratchy or irritated.  If you eyelid(s) don't close completely when you sleep, put some ointment in your eyes before bedtime.  EMERGENCY: If you experience SEVERE EYE PAIN OR HEADACHE UNRELIEVED BY TYLENOL OR TRAMADOL , NAUSEA OR VOMITING, WORSENING REDNESS, OR WORSENING VISION (ESPECIALLY VISION THAT WAS INITIALLY BETTER) CALL [phone] OR [phone] DURING BUSINESS HOURS OR AFTER HOURS.

## 2024-01-26 ENCOUNTER — Encounter: Payer: Self-pay | Admitting: Ophthalmology

## 2024-01-26 NOTE — Anesthesia Preprocedure Evaluation (Signed)
 Anesthesia Evaluation  Patient identified by MRN, date of birth, ID band Patient awake    Reviewed: Allergy & Precautions, H&P , NPO status , Patient's Chart, lab work & pertinent test results  History of Anesthesia Complications (+) PONV and history of anesthetic complications  Airway Mallampati: II  TM Distance: >3 FB Neck ROM: Full    Dental no notable dental hx.    Pulmonary neg pulmonary ROS, shortness of breath, former smoker   Pulmonary exam normal breath sounds clear to auscultation       Cardiovascular Exercise Tolerance: Good + CAD  negative cardio ROS Normal cardiovascular exam Rhythm:Regular Rate:Normal  08-27-19 cath  Prox RCA to Mid RCA lesion is 30% stenosed.  Prox LAD to Mid LAD lesion is 20% stenosed.   Findings:  1, Mild non-obstructive CAD 2. LVEF 60-65%  Plan: Medical therapy.  03-14-23 echo 1. Left ventricular ejection fraction, by estimation, is 60 to 65%. The  left ventricle has normal function. The left ventricle has no regional  wall motion abnormalities. Left ventricular diastolic parameters were  normal. The average left ventricular  global longitudinal strain is -22.6 %. The global longitudinal strain is  normal.   2. Right ventricular systolic function is normal. The right ventricular  size is normal. There is normal pulmonary artery systolic pressure.   3. Right atrial size was mildly dilated.   4. The mitral valve is normal in structure. No evidence of mitral valve  regurgitation. No evidence of mitral stenosis.   5. The aortic valve is tricuspid. There is mild calcification of the  aortic valve. There is mild thickening of the aortic valve. Aortic valve  regurgitation is trivial. Aortic valve sclerosis is present, with no  evidence of aortic valve stenosis.   6. The inferior vena cava is normal in size with greater than 50%  respiratory variability, suggesting right atrial pressure of  3 mmHg.   Works as Interior and spatial designer, swims and goes to gym several times per week   Neuro/Psych  PSYCHIATRIC DISORDERS Anxiety Depression    negative neurological ROS  negative psych ROS   GI/Hepatic negative GI ROS, Neg liver ROS,GERD  ,,(+) Hepatitis -  Endo/Other  negative endocrine ROS Hyperthyroidism   Renal/GU negative Renal ROS  negative genitourinary   Musculoskeletal negative musculoskeletal ROS (+)    Abdominal   Peds negative pediatric ROS (+)  Hematology negative hematology ROS (+)   Anesthesia Other Findings Medical History  GERD (gastroesophageal reflux disease) Hepatitis C Abnormal CAT scan  Anxiety PONV (postoperative nausea and vomiting)  Cancer (HCC) Family history of pancreatic cancer Family history of prostate cancer Family history of left breast cancer History of radiation therapy Personal history of radiation therapy Thyroid  disease Coronary artery disease     Reproductive/Obstetrics negative OB ROS                              Anesthesia Physical Anesthesia Plan  ASA: 3  Anesthesia Plan: MAC   Post-op Pain Management:    Induction: Intravenous  PONV Risk Score and Plan:   Airway Management Planned: Natural Airway and Nasal Cannula  Additional Equipment:   Intra-op Plan:   Post-operative Plan:   Informed Consent: I have reviewed the patients History and Physical, chart, labs and discussed the procedure including the risks, benefits and alternatives for the proposed anesthesia with the patient or authorized representative who has indicated his/her understanding and acceptance.     Dental  Advisory Given  Plan Discussed with: Anesthesiologist, CRNA and Surgeon  Anesthesia Plan Comments: (Patient consented for risks of anesthesia including but not limited to:  - adverse reactions to medications - damage to eyes, teeth, lips or other oral mucosa - nerve damage due to positioning  - sore throat or  hoarseness - Damage to heart, brain, nerves, lungs, other parts of body or loss of life  Patient voiced understanding and assent.)        Anesthesia Quick Evaluation

## 2024-01-27 ENCOUNTER — Ambulatory Visit: Admitting: Anesthesiology

## 2024-01-27 ENCOUNTER — Encounter
Admission: RE | Disposition: A | Discharge status: Home / Self Care | Payer: Self-pay | Source: Home / Self Care | Attending: Ophthalmology

## 2024-01-27 ENCOUNTER — Encounter: Payer: Self-pay | Admitting: Ophthalmology

## 2024-01-27 ENCOUNTER — Other Ambulatory Visit: Payer: Self-pay

## 2024-01-27 ENCOUNTER — Ambulatory Visit
Admission: RE | Admit: 2024-01-27 | Discharge: 2024-01-27 | Disposition: A | Discharge status: Home / Self Care | Attending: Ophthalmology | Admitting: Ophthalmology

## 2024-01-27 DIAGNOSIS — Z87891 Personal history of nicotine dependence: Secondary | ICD-10-CM | POA: Diagnosis not present

## 2024-01-27 DIAGNOSIS — H02403 Unspecified ptosis of bilateral eyelids: Secondary | ICD-10-CM | POA: Diagnosis present

## 2024-01-27 DIAGNOSIS — I2510 Atherosclerotic heart disease of native coronary artery without angina pectoris: Secondary | ICD-10-CM | POA: Diagnosis not present

## 2024-01-27 DIAGNOSIS — K219 Gastro-esophageal reflux disease without esophagitis: Secondary | ICD-10-CM | POA: Diagnosis not present

## 2024-01-27 DIAGNOSIS — F419 Anxiety disorder, unspecified: Secondary | ICD-10-CM | POA: Insufficient documentation

## 2024-01-27 DIAGNOSIS — H02831 Dermatochalasis of right upper eyelid: Secondary | ICD-10-CM | POA: Diagnosis present

## 2024-01-27 DIAGNOSIS — H02834 Dermatochalasis of left upper eyelid: Secondary | ICD-10-CM | POA: Insufficient documentation

## 2024-01-27 HISTORY — PX: BROW LIFT: SHX178

## 2024-01-27 HISTORY — DX: Polyneuropathy, unspecified: G62.9

## 2024-01-27 HISTORY — PX: PTOSIS REPAIR: SHX6568

## 2024-01-27 HISTORY — DX: Anxiety disorder, unspecified: F41.9

## 2024-01-27 HISTORY — DX: Personal history of malignant neoplasm of breast: Z85.3

## 2024-01-27 HISTORY — DX: Low back pain, unspecified: M54.50

## 2024-01-27 HISTORY — DX: Cervicalgia: M54.2

## 2024-01-27 HISTORY — DX: Atherosclerotic heart disease of native coronary artery without angina pectoris: I25.10

## 2024-01-27 HISTORY — DX: Other specified dermatitis: L30.8

## 2024-01-27 HISTORY — DX: Thyrotoxicosis, unspecified without thyrotoxic crisis or storm: E05.90

## 2024-01-27 SURGERY — BLEPHAROPLASTY
Anesthesia: Monitor Anesthesia Care | Laterality: Bilateral

## 2024-01-27 MED ORDER — ONDANSETRON HCL 4 MG/2ML IJ SOLN
INTRAMUSCULAR | Status: AC
  Filled 2024-01-27: qty 2

## 2024-01-27 MED ORDER — TRAMADOL HCL 50 MG PO TABS: ORAL_TABLET | ORAL | 0 refills | Status: DC

## 2024-01-27 MED ORDER — FENTANYL CITRATE (PF) 100 MCG/2ML IJ SOLN
INTRAMUSCULAR | Status: AC
  Filled 2024-01-27: qty 2

## 2024-01-27 MED ORDER — LIDOCAINE HCL (CARDIAC) PF 100 MG/5ML IV SOSY
PREFILLED_SYRINGE | INTRAVENOUS | Status: DC | PRN
  Administered 2024-01-27: 60 mg via INTRAVENOUS

## 2024-01-27 MED ORDER — ONDANSETRON HCL 4 MG/2ML IJ SOLN
INTRAMUSCULAR | Status: DC | PRN
Start: 2024-01-27 — End: 2024-01-27
  Administered 2024-01-27: 4 mg via INTRAVENOUS

## 2024-01-27 MED ORDER — MIDAZOLAM HCL 2 MG/2ML IJ SOLN
INTRAMUSCULAR | Status: DC | PRN
  Administered 2024-01-27: 2 mg via INTRAVENOUS

## 2024-01-27 MED ORDER — BSS IO SOLN
INTRAOCULAR | Status: DC | PRN
  Administered 2024-01-27: 15 mL via INTRAOCULAR

## 2024-01-27 MED ORDER — MIDAZOLAM HCL 2 MG/2ML IJ SOLN
INTRAMUSCULAR | Status: AC
  Filled 2024-01-27: qty 2

## 2024-01-27 MED ORDER — PROPOFOL 1000 MG/100ML IV EMUL
INTRAVENOUS | Status: AC
  Filled 2024-01-27: qty 100

## 2024-01-27 MED ORDER — ONDANSETRON HCL 4 MG/2ML IJ SOLN
4.0000 mg | Freq: Once | INTRAMUSCULAR | Status: AC
  Administered 2024-01-27: 4 mg via INTRAVENOUS

## 2024-01-27 MED ORDER — ERYTHROMYCIN 5 MG/GM OP OINT: TOPICAL_OINTMENT | OPHTHALMIC | 2 refills | Status: DC

## 2024-01-27 MED ORDER — LACTATED RINGERS IV SOLN
INTRAVENOUS | Status: DC
Start: 2024-01-27 — End: 2024-01-27

## 2024-01-27 MED ORDER — PROPOFOL 500 MG/50ML IV EMUL
INTRAVENOUS | Status: DC | PRN
  Administered 2024-01-27: 25 ug/kg/min via INTRAVENOUS

## 2024-01-27 MED ORDER — ERYTHROMYCIN 5 MG/GM OP OINT
TOPICAL_OINTMENT | OPHTHALMIC | Status: DC | PRN
  Administered 2024-01-27: 1 via OPHTHALMIC

## 2024-01-27 MED ORDER — LIDOCAINE-EPINEPHRINE 2 %-1:100000 IJ SOLN
INTRAMUSCULAR | Status: DC | PRN
  Administered 2024-01-27: 2 mL via OPHTHALMIC

## 2024-01-27 MED ORDER — TETRACAINE HCL 0.5 % OP SOLN
OPHTHALMIC | Status: DC | PRN
  Administered 2024-01-27: 2 [drp] via OPHTHALMIC

## 2024-01-27 MED ORDER — FENTANYL CITRATE (PF) 100 MCG/2ML IJ SOLN
INTRAMUSCULAR | Status: DC | PRN
  Administered 2024-01-27 (×4): 25 ug via INTRAVENOUS

## 2024-01-27 SURGICAL SUPPLY — 19 items
APPLICATOR COT TIP 3IN STRL (MISCELLANEOUS) ×5 IMPLANT
BLADE SURG 15 STRL LF DISP TIS (BLADE) ×3 IMPLANT
CORD BIP STRL DISP 12FT (MISCELLANEOUS) ×1 IMPLANT
GAUZE SPONGE 2X2 STRL 8-PLY (GAUZE/BANDAGES/DRESSINGS) ×10 IMPLANT
GAUZE SPONGE 4X4 12PLY STRL (GAUZE/BANDAGES/DRESSINGS) ×2 IMPLANT
GLOVE SURG LX STRL 7.0 MICRO (GLOVE) ×2 IMPLANT
GOWN STRL REUS W/ TWL LRG LVL3 (GOWN DISPOSABLE) ×1 IMPLANT
MARKER SKIN XFINE TIP W/RULER (MISCELLANEOUS) ×1 IMPLANT
NDL 18GX1X1/2 (RX/OR ONLY) (NEEDLE) ×1 IMPLANT
NDL HYPO 30X.5 LL (NEEDLE) ×2 IMPLANT
NEEDLE 18GX1X1/2 (RX/OR ONLY) (NEEDLE) ×1 IMPLANT
NEEDLE HYPO 30X.5 LL (NEEDLE) ×2 IMPLANT
PACK ENT CUSTOM (PACKS) ×1 IMPLANT
SOLUTION PREP PVP 2OZ (MISCELLANEOUS) ×1 IMPLANT
SUT GUT PLAIN 6-0 1X18 ABS (SUTURE) ×1 IMPLANT
SUT PROLENE 6 0 P 1 18 (SUTURE) IMPLANT
SYR 10ML LL (SYRINGE) ×1 IMPLANT
SYR 3ML LL SCALE MARK (SYRINGE) ×1 IMPLANT
WATER STERILE IRR 250ML POUR (IV SOLUTION) ×1 IMPLANT

## 2024-01-27 NOTE — Interval H&P Note (Signed)
 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  The various methods of treatment have been discussed with the patient and family. After consideration of risks, benefits and other options for treatment, the patient has consented to  Procedure(s): BLEPHAROPLASTY (Bilateral) REPAIR, BLEPHAROPTOSIS (Bilateral) as a surgical intervention.  The patient's history has been reviewed, patient examined, no change in status, stable for surgery.  I have reviewed the patient's chart and labs.  Questions were answered to the patient's satisfaction.     [name], Lamonta Cypress M

## 2024-01-27 NOTE — Anesthesia Postprocedure Evaluation (Signed)
 Anesthesia Post Note  Patient: Courtney Romero  Procedure(s) Performed: BLEPHAROPLASTY (Bilateral) REPAIR, BLEPHAROPTOSIS (Bilateral)  Patient location during evaluation: PACU Anesthesia Type: MAC Level of consciousness: awake and alert Pain management: pain level controlled Vital Signs Assessment: post-procedure vital signs reviewed and stable Respiratory status: spontaneous breathing, nonlabored ventilation, respiratory function stable and patient connected to nasal cannula oxygen Cardiovascular status: stable and blood pressure returned to baseline Postop Assessment: no apparent nausea or vomiting Anesthetic complications: no   No notable events documented.   Last Vitals:  Vitals:   01/27/24 0915 01/27/24 0923  BP: 124/79 119/73  Pulse: 72 63  Resp: 14 13  Temp:    SpO2: 98% 99%    Last Pain:  Vitals:   01/27/24 0923  TempSrc:   PainSc: 0-No pain                 Zaccary Creech C Obrien Huskins

## 2024-01-27 NOTE — H&P (Signed)
 [facility]: [facility]  Primary Care Physician:  Lenon Layman ORN, MD Ophthalmologist: Dr. Greig HERO. [name], M.D.  Pre-Procedure History & Physical: HPI:  Courtney Romero is a 63 y.o. female here for periocular surgery.   Past Medical History:  Diagnosis Date   Abnormal CAT scan 2013   Anxiety    Anxiety and depression    Cancer (HCC)    Chronic low back pain    Coronary artery disease 08/27/2019   mild non-obstrucive CAD per heart cath   Family history of breast cancer    Family history of pancreatic cancer    Family history of prostate cancer    GERD (gastroesophageal reflux disease)    Hepatitis C 2001   Followed by Dr. Prentice Due at [facility]   History of left breast cancer    07/26/17- 08/22/17 40.05 Gy directed to the left breast in 15 fractions followed by a boost of 10 Gy in 5 fractions.   History of radiation therapy 07/26/17- 08/22/17   40.05 Gy directed to the left breast in 15 fractions followed by a boost of 10 Gy in 5 fractions.    Neck pain    Neuropathy    Personal history of radiation therapy 2019   PONV (postoperative nausea and vomiting)    Spongiotic dermatitis    Subclinical hyperthyroidism    Thyroid  disease     Past Surgical History:  Procedure Laterality Date   ABDOMINAL HYSTERECTOMY  2009   breast cyst removal  1995   BREAST LUMPECTOMY Left    BREAST LUMPECTOMY WITH RADIOACTIVE SEED AND SENTINEL LYMPH NODE BIOPSY Left 06/14/2017   Procedure: LEFT BREAST SEED LOCALIZED LUMPECTOMY (2 SEEDS) AND SENTINEL LYMPH NODE BIOPSY;  Surgeon: Vanderbilt Ned, MD;  Location: [facility];  Service: General;  Laterality: Left;   COLONOSCOPY  2011   COLONOSCOPY WITH PROPOFOL  N/A 12/12/2017   Procedure: COLONOSCOPY WITH PROPOFOL ;  Surgeon: Therisa Bi, MD;  Location: [facility] ENDOSCOPY;  Service: Gastroenterology;  Laterality: N/A;   ESOPHAGOGASTRODUODENOSCOPY (EGD) WITH PROPOFOL  N/A 12/12/2017   Procedure: ESOPHAGOGASTRODUODENOSCOPY (EGD) WITH  PROPOFOL ;  Surgeon: Therisa Bi, MD;  Location: [facility] ENDOSCOPY;  Service: Gastroenterology;  Laterality: N/A;   FLEXIBLE SIGMOIDOSCOPY  02/12/2013   have normal perianal exam, question focal prolapse. 2 small scars in the rectum.   HEMORRHOID BANDING  2012   3 times over three months   LEFT HEART CATH AND CORONARY ANGIOGRAPHY N/A 08/27/2019   Procedure: LEFT HEART CATH AND CORONARY ANGIOGRAPHY;  Surgeon: Cherrie Toribio SAUNDERS, MD;  Location: [facility] INVASIVE CV LAB;  Service: Cardiovascular;  Laterality: N/A;   SIGMOIDOSCOPY  2013   TONSILECTOMY/ADENOIDECTOMY WITH MYRINGOTOMY  remote   UPPER GI ENDOSCOPY  2013    Prior to Admission medications   Medication Sig Start Date End Date Taking? Authorizing Provider  Magnesium 400 MG CAPS Take 1 capsule by mouth daily.   Yes [provider]  aspirin  EC 81 MG tablet Take 1 tablet (81 mg total) by mouth daily. 04/02/20   Bensimhon, Toribio SAUNDERS, MD  Calcium  Carbonate (CALCIUM  600 PO) Take 600 mg by mouth daily.     [provider]  cholecalciferol (VITAMIN D ) 1000 units tablet Take 1,000 Units by mouth daily.    [provider]  cyanocobalamin  (VITAMIN B12) 1000 MCG/ML injection Inject 1 mL (1,000 mcg total) into the muscle every 30 (thirty) days. 12/22/21   Gudena, Vinay, MD  estradiol  (ESTRACE ) 0.1 MG/GM vaginal cream Estrogen Cream Instruction Discard applicator Apply  pea sized amount to tip of finger to urethra before bed. Wash hands well after application. Use Monday, Wednesday and Friday 09/28/22   [name], MD  LORazepam  (ATIVAN ) 0.5 MG tablet Take 0.5 mg by mouth every 4 (four) hours as needed for anxiety.    [provider]  Melatonin 5 MG TABS Take 5 mg by mouth at bedtime.     [provider]  omeprazole  (PRILOSEC) 20 MG capsule Take 1 capsule (20 mg total) by mouth daily. 03/24/23   Armbruster, Elspeth SQUIBB, MD  rosuvastatin  (CRESTOR ) 20 MG tablet Take 1 tablet (20 mg total) by mouth daily. 03/07/23    Swinyer, Rosaline HERO, NP    Allergies as of 11/15/2023 - Review Complete 10/30/2023  Allergen Reaction Noted   Propoxyphene Rash 02/06/2015   Thioridazine hcl     Bupropion Rash and Other (See Comments) 04/05/2012   Citalopram Rash and Other (See Comments) 12/13/2013   Duloxetine Rash and Other (See Comments) 04/05/2012   Thioridazine Rash 02/24/2015    Family History  Problem Relation Age of Onset   Hypertension Mother    Thyroid  disease Mother        Multi problems   Coronary artery disease Father    Heart failure Father    Atrial fibrillation Father    Hypertension Brother    Diabetes Brother    Heart attack Brother 7   Colon cancer Neg Hx    Esophageal cancer Neg Hx    Rectal cancer Neg Hx    Stomach cancer Neg Hx     Social History   Socioeconomic History   Marital status: Married    Spouse name: Not on file   Number of children: Not on file   Years of education: Not on file   Highest education level: Not on file  Occupational History   Occupation: Full Time  Tobacco Use   Smoking status: Former    Current packs/day: 0.00    Average packs/day: 1.5 packs/day for 20.0 years (30.0 ttl pk-yrs)    Types: Cigarettes    Start date: 04/19/1978    Quit date: 04/19/1998    Years since quitting: 25.7    Passive exposure: Past   Smokeless tobacco: Never   Tobacco comments:    quit smoking in 2000  Vaping Use   Vaping status: Never Used  Substance and Sexual Activity   Alcohol use: Yes    Comment: rarely   Drug use: No   Sexual activity: Yes  Other Topics Concern   Not on file  Social History Narrative   Regular exercise: Yes   Social Drivers of Health   Financial Resource Strain: Low Risk  (11/11/2023)   Received from [facility]   Overall Financial Resource Strain (CARDIA)    Difficulty of Paying Living Expenses: Not hard at all  Food Insecurity: No Food Insecurity (11/11/2023)   Received from [facility]   Hunger Vital  Sign    Within the past 12 months, you worried that your food would run out before you got the money to buy more.: Never true    Within the past 12 months, the food you bought just didn't last and you didn't have money to get more.: Never true  Transportation Needs: No Transportation Needs (11/11/2023)   Received from [facility] - Transportation    In the past 12 months, has lack of transportation kept you from medical appointments or from  getting medications?: No    Lack of Transportation (Non-Medical): No  Physical Activity: Not on file  Stress: Not on file  Social Connections: Not on file  Intimate Partner Violence: Not on file    Review of Systems: See HPI, otherwise negative ROS  Physical Exam: BP (!) 150/96   Pulse 79   Temp (!) 96.3 F (35.7 C) (Temporal)   Ht 5' 7 (1.702 m)   Wt 69 kg   SpO2 99%   BMI 23.84 kg/m  General:   Alert and cooperative in NAD Head:  Normocephalic and atraumatic. Respiratory:  Normal work of breathing.  Impression/Plan: Courtney Romero is here for periocular surgery.  Risks, benefits, limitations, and alternatives regarding surgery have been reviewed with the patient.  Questions have been answered.  All parties agreeable.   [name] Greig CHRISTELLA, MD  01/27/2024, 8:08 AM

## 2024-01-27 NOTE — Op Note (Signed)
 Preoperative Diagnosis:  1. Visually significant blepharoptosis bilateral  Upper Eyelid(s) 2. Visually significant dermatochalasis bilateral  Upper Eyelid(s)  Postoperative Diagnosis:  Same.  Procedure(s) Performed:   1. Blepharoptosis repair with levator aponeurosis advancement bilateral  Upper Eyelid(s) 2. Upper eyelid blepharoplasty with excess skin excision  bilateral  Upper Eyelid(s)  Surgeon: Greig HERO. [name], M.D.  Assistants: none  Anesthesia: MAC  Specimens: None.  Estimated Blood Loss: Minimal.  Complications: None.  Operative Findings: None Dictated  Procedure:   Allergies were reviewed and the patient Propoxyphene, Thioridazine hcl, Bupropion, Citalopram, Duloxetine, and Thioridazine.   After the risks, benefits, complications and alternatives were discussed with the patient, appropriate informed consent was obtained.  While seated in an upright position and looking in primary gaze, the mid pupillary line was marked on the upper eyelid margins bilaterally. The patient was then brought to the operating suite and reclined supine.  Timeout was conducted and the patient was sedated.  Local anesthetic consisting of a 50-50 mixture of 2% lidocaine  with epinephrine  and 0.75% bupivacaine  with added Hylenex was injected subcutaneously to both  upper eyelid(s). After adequate local was instilled, the patient was prepped and draped in the usual sterile fashion for eyelid surgery.   Attention was turned to the upper eyelids. A 9mm upper eyelid crease incision line was marked with calipers on both  upper eyelid(s).  A pinch test was used to estimate the amount of excess skin to remove and this was marked in standard blepharoplasty style fashion. Attention was turned to the  right  upper eyelid. A #15 blade was used to open the premarked incision line. A Skin and muscle flap was excised and hemostasis was obtained with bipolar cautery.   Westcott scissors were then used to transect  through orbicularis down to the tarsal plate. Epitarsus was dissected to create a smooth surface to suture to. Dissection was then carried superiorly in the plane between orbicularis and orbital septum. Once the preaponeurotic fat pocket was identified, the orbital septum was opened. This revealed the levator and its aponeurosis.    Attention was then turned to the opposite eyelid where the same procedure was performed in the same manner. Hemostasis was obtained with bipolar cautery throughout.   3 interrupted 6-0 Prolene sutures were then passed partial thickness through the tarsal plates of both  upper eyelid(s). These sutures were placed in line with the mid pupillary, medial limbal, and lateral limbal lines. The sutures were fixed to the levator aponeurosis and adjusted until a nice lid height and contour were achieved. Once nice symmetry was achieved, the skin incisions were closed with a running 6-0 plain gut suture. The patient tolerated the procedure well.  Erythromycin ophthalmic ophthalmic ointment was applied to the incision site(s) followed by ice packs. The patient was taken to the recovery area where she recovered without difficulty.  Post-Op Plan/Instructions:   The patient was instructed to use ice packs frequently for the next 48 hours. She was instructed to use Erythromycin ophthalmic ophthalmic ointment on her incisions 4 times a day for the next 12 to 14 days. Shewas given a prescription for tramadol (or similar) for pain control should Tylenol  not be effective. She was asked to to follow up at the [facility] in [place], [place] in 2-3 weeks' time or sooner as needed for problems.  Larkyn Greenberger M. [name], M.D. Ophthalmology

## 2024-01-27 NOTE — Transfer of Care (Signed)
 Immediate Anesthesia Transfer of Care Note  Patient: Courtney Romero  Procedure(s) Performed: BLEPHAROPLASTY (Bilateral) REPAIR, BLEPHAROPTOSIS (Bilateral)  Patient Location: PACU  Anesthesia Type: MAC  Level of Consciousness: awake, alert  and patient cooperative  Airway and Oxygen Therapy: Patient Spontanous Breathing and Patient connected to supplemental oxygen  Post-op Assessment: Post-op Vital signs reviewed, Patient's Cardiovascular Status Stable, Respiratory Function Stable, Patent Airway and No signs of Nausea or vomiting  Post-op Vital Signs: Reviewed and stable  Complications: No notable events documented.

## 2024-02-21 ENCOUNTER — Other Ambulatory Visit: Payer: Self-pay | Admitting: Hematology and Oncology

## 2024-02-21 DIAGNOSIS — Z853 Personal history of malignant neoplasm of breast: Secondary | ICD-10-CM

## 2024-02-22 ENCOUNTER — Ambulatory Visit (INDEPENDENT_AMBULATORY_CARE_PROVIDER_SITE_OTHER): Admitting: Urology

## 2024-02-22 VITALS — BP 145/86 | HR 93 | Ht 66.0 in | Wt 152.0 lb

## 2024-02-22 DIAGNOSIS — R399 Unspecified symptoms and signs involving the genitourinary system: Secondary | ICD-10-CM | POA: Diagnosis not present

## 2024-02-22 DIAGNOSIS — N390 Urinary tract infection, site not specified: Secondary | ICD-10-CM | POA: Diagnosis not present

## 2024-02-22 LAB — MICROSCOPIC EXAMINATION

## 2024-02-22 MED ORDER — NITROFURANTOIN MONOHYD MACRO 100 MG PO CAPS: 100.0000 mg | ORAL_CAPSULE | Freq: Every day | ORAL | 0 refills | Status: AC

## 2024-02-22 MED ORDER — ESTRADIOL 0.01 % VA CREA: TOPICAL_CREAM | VAGINAL | 12 refills | Status: AC

## 2024-02-22 NOTE — Progress Notes (Signed)
   02/22/2024 4:29 PM   Blakelynn Scheeler Thaxton 1960-05-09 [id number]  Reason for visit: Follow up recurrent UTI  History: Initial visit June 2024 for recurrent UTIs, started around time of menopause, symptoms primarily dysuria, pelvic pressure, urgency/frequency Was treated with topical estrogen cream and 90 days low-dose antibiotics and initially did well.  With her history of breast cancer she opted to defer the estrogen cream and has had recurrent infections over the last year  Physical Exam: BP (!) 145/86 (BP Location: Left Arm, Patient Position: Sitting, Cuff Size: Normal)   Pulse 93   Ht 5' 6 (1.676 m)   Wt 152 lb (68.9 kg)   SpO2 98%   BMI 24.53 kg/m   Imaging/labs: CT abdomen and pelvis with contrast March 2024 with no urologic abnormalities  Today: Has had multiple culture documented UTIs over the last year with dysuria, urgency, pelvic pressure Recently treated with antibiotics, denies any UTI symptoms today aside from some pelvic pressure Denies any gross hematuria or flank pain, UTI symptoms resolve when she takes antibiotics Takes cranberry tablets daily  Plan:   Recurrent UTI: Initially did well with low-dose prophylactic antibiotics and topical estrogen cream, has had recurrence of infections after stopping those.  CT 2024 benign, cystoscopy likely low yield. We discussed the evaluation and treatment of patients with recurrent UTIs at length.  We specifically discussed the differences between asymptomatic bacteriuria and true urinary tract infection.  We discussed the AUA definition of recurrent UTI of at least 2 culture proven symptomatic acute cystitis episodes in a 28-month period, or 3 within a 1 year period.  We discussed the importance of culture directed antibiotic treatment, and antibiotic stewardship.  First-line therapy includes nitrofurantoin (5 days), Bactrim(3 days), or fosfomycin(3 g single dose).  Possible etiologies of recurrent infection include periurethral  tissue atrophy in postmenopausal woman, constipation, sexual activity, incomplete emptying, anatomic abnormalities, and even genetic predisposition.  Finally, we discussed the role of perineal hygiene, timed voiding, adequate hydration, topical vaginal estrogen, cranberry prophylaxis, and low-dose antibiotic prophylaxis. Resume topical estrogen cream, 90 days nitrofurantoin  prophylaxis, RTC 3 to 4 months.  Could consider long-term low-dose antibiotics if persistent infections despite topical estrogen cream or hiprex, consider cystoscopy    Redell JAYSON Burnet, MD  [facility] [address], [address] [place], [place] [address] [phone]

## 2024-02-23 LAB — MICROSCOPIC EXAMINATION: Epithelial Cells (non renal): >10 /HPF — AB (ref 0–10)

## 2024-02-23 LAB — URINALYSIS, COMPLETE
Bilirubin, UA: NEGATIVE
Glucose, UA: NEGATIVE
Ketones, UA: NEGATIVE
Nitrite, UA: NEGATIVE
Protein,UA: NEGATIVE
RBC, UA: NEGATIVE
Specific Gravity, UA: 1.03 (ref 1.005–1.030)
Urobilinogen, Ur: 0.2 mg/dL (ref 0.2–1.0)
pH, UA: 5.5 (ref 5.0–7.5)

## 2024-02-27 ENCOUNTER — Ambulatory Visit: Admitting: Urology

## 2024-03-07 ENCOUNTER — Ambulatory Visit (INDEPENDENT_AMBULATORY_CARE_PROVIDER_SITE_OTHER): Admitting: Cardiology

## 2024-03-07 VITALS — BP 120/80 | Ht 70.0 in | Wt 152.0 lb

## 2024-03-07 DIAGNOSIS — E785 Hyperlipidemia, unspecified: Secondary | ICD-10-CM

## 2024-03-07 DIAGNOSIS — I2510 Atherosclerotic heart disease of native coronary artery without angina pectoris: Secondary | ICD-10-CM | POA: Diagnosis not present

## 2024-03-07 MED ORDER — ROSUVASTATIN CALCIUM 20 MG PO TABS: 20.0000 mg | ORAL_TABLET | Freq: Every day | ORAL | 3 refills | Status: AC

## 2024-03-07 NOTE — Patient Instructions (Signed)
 Medication Instructions:  Your physician recommends that you continue on your current medications as directed. Please refer to the Current Medication list given to you today.  *If you need a refill on your cardiac medications before your next appointment, please call your pharmacy*  Lab Work: NONE If you have labs (blood work) drawn today and your tests are completely normal, you will receive your results only by: MyChart Message (if you have MyChart) OR A paper copy in the mail If you have any lab test that is abnormal or we need to change your treatment, we will call you to review the results.  Testing/Procedures: NONE  Follow-Up: At [facility], you and your health needs are our priority.  As part of our continuing mission to provide you with exceptional heart care, our providers are all part of one team.  This team includes your primary Cardiologist (physician) and Advanced Practice Providers or APPs (Physician Assistants and Nurse Practitioners) who all work together to provide you with the care you need, when you need it.  Your next appointment:   12 month(s)  Provider:   Rosaline Bane, NP     We recommend signing up for the patient portal called MyChart.  Sign up information is provided on this After Visit Summary.  MyChart is used to connect with patients for Virtual Visits (Telemedicine).  Patients are able to view lab/test results, encounter notes, upcoming appointments, etc.  Non-urgent messages can be sent to your provider as well.   To learn more about what you can do with MyChart, go to [email or web address].

## 2024-03-07 NOTE — Progress Notes (Signed)
 Cardiology Office Note:  .   Date:  03/07/2024  ID:  Courtney Romero, DOB [birth year], MRN [id number] PCP: Lenon Layman ORN, MD  [facility] Providers Cardiologist:  Oneil Parchment, MD     History of Present Illness: .   Courtney Romero is a 63 y.o. female Discussed the use of AI scribe History of Present Illness Courtney Romero is a 63 year old female with coronary artery disease who presents for follow-up.  She has a history of nonobstructive coronary artery disease diagnosed via cardiac catheterization in 2021. She experiences chest discomfort and occasional palpitations, which are atypical for angina. No shortness of breath is reported. She continues to take aspirin  and rosuvastatin  for her condition.  She has hyperlipidemia and is taking rosuvastatin , with an LDL level of 49 and triglycerides at 99. Her LDL cholesterol was 54 last November. Her hemoglobin A1c is 6.4.  An echocardiogram performed on March 14, 2023, found a normal ejection fraction and mild regurgitation.  She has neuropathy, possibly related to previous tamoxifen  use. Gabapentin  was ineffective and caused cognitive side effects.  She underwent bypass surgery in 1996 and currently takes carvedilol to manage her heart rate, which ranges from 69 to 90. She is concerned about her heart rate not increasing significantly during exercise.      Studies Reviewed: SABRA   EKG Interpretation Date/Time:  Wednesday March 07 2024 15:06:29 EST Ventricular Rate:  69 PR Interval:  172 QRS Duration:  76 QT Interval:  394 QTC Calculation: 422 R Axis:   -23  Text Interpretation: Normal sinus rhythm Inferior infarct , age undetermined Possible Anterior infarct (cited on or before 03-Mar-2023) When compared with ECG of 03-Mar-2023 09:16, No significant change since last tracing Confirmed by Parchment Oneil ([id number]) on 03/07/2024 3:07:28 PM    Results LABS LDL: 54 (02/2023) Triglycerides: 99 Hemoglobin A1c:  6.4  DIAGNOSTIC Echocardiogram: Normal ejection fraction (03/14/2023) EKG: Stable Risk Assessment/Calculations:            Physical Exam:   VS:  BP 120/80 (BP Location: Right Arm, Patient Position: Sitting, Cuff Size: Normal)   Ht 5' 10 (1.778 m)   Wt 152 lb (68.9 kg)   SpO2 99%   BMI 21.81 kg/m    Wt Readings from Last 3 Encounters:  03/07/24 152 lb (68.9 kg)  02/22/24 152 lb (68.9 kg)  01/27/24 152 lb 3.2 oz (69 kg)    GEN: Well nourished, well developed in no acute distress NECK: No JVD; No carotid bruits CARDIAC: RRR, no murmurs, no rubs, no gallops RESPIRATORY:  Clear to auscultation without rales, wheezing or rhonchi  ABDOMEN: Soft, non-tender, non-distended EXTREMITIES:  No edema; No deformity   ASSESSMENT AND PLAN: .    Assessment and Plan Assessment & Plan Nonobstructive coronary artery disease Intermittent chest discomfort and palpitations, atypical for angina, possibly related to anxiety. No shortness of breath. EKG shows no significant changes. Previous echocardiogram showed normal ejection fraction. Carvedilol is used for heart rate control and anti-anginal effects, with a target heart rate range of 60-90 bpm. - Continue aspirin  and rosuvastatin . - Continue carvedilol for heart rate control and anti-anginal effects.  Hyperlipidemia LDL cholesterol well-controlled at 54 mg/dL on rosuvastatin . Triglycerides at 99 mg/dL. Hemoglobin A1c is 6.4%, with a goal to reduce it further. - Continue rosuvastatin  for lipid management.  Aortic valve trivial regurgitation Noted on echocardiogram. No symptoms suggestive of worsening valve function. No need for surgical intervention at this time. - Continue to monitor with  periodic echocardiograms in several years to assess for any changes in valve function.         Dispo: 1 yr  Signed, Oneil Parchment, MD

## 2024-04-23 ENCOUNTER — Ambulatory Visit
Admission: RE | Admit: 2024-04-23 | Discharge: 2024-04-23 | Disposition: A | Discharge status: Home / Self Care | Source: Ambulatory Visit | Attending: Hematology and Oncology | Admitting: Hematology and Oncology

## 2024-04-23 DIAGNOSIS — Z853 Personal history of malignant neoplasm of breast: Secondary | ICD-10-CM

## 2024-05-24 ENCOUNTER — Ambulatory Visit: Admitting: Urology

## 2024-05-25 NOTE — Progress Notes (Unsigned)
 "    05/28/2024 8:37 AM   Courtney Romero [birth year] [id number]  Referring provider: Lenon Layman ORN, MD [address] [facility] [place],  [place] [address]  Urological history: 1. rUTI's - CT (06/3032) - no nidus for infection  - February 14, 2024 - E. Coli - January 02, 2024 - E. coli - October 30, 2023 - E. Coli - October 19, 2023 - MUF - September 23, 2023 -Proteus mirabilis  - July 27, 2023 - E.coli - June 27, 2023 - E.coli - June 03, 2023 - E.coli - vaginal estrogen cream, 90 days nitrofurantoin  prophylaxis (started 02/2024)   No chief complaint on file.  HPI: Courtney Romero is a 64 y.o. woman who presents today for three month follow up after a trial of nitrofurantoin  prophylaxis for 90 days for recurrent UTIs.  Previous records reviewed.  PMH: Past Medical History:  Diagnosis Date   Abnormal CAT scan 2013   Anxiety    Anxiety and depression    Cancer (HCC)    Chronic low back pain    Coronary artery disease 08/27/2019   mild non-obstrucive CAD per heart cath   Family history of breast cancer    Family history of pancreatic cancer    Family history of prostate cancer    GERD (gastroesophageal reflux disease)    Hepatitis C 2001   Followed by Dr. Prentice Due at [facility]   History of left breast cancer    07/26/17- 08/22/17 40.05 Gy directed to the left breast in 15 fractions followed by a boost of 10 Gy in 5 fractions.   History of radiation therapy 07/26/17- 08/22/17   40.05 Gy directed to the left breast in 15 fractions followed by a boost of 10 Gy in 5 fractions.    Neck pain    Neuropathy    Personal history of radiation therapy 2019   PONV (postoperative nausea and vomiting)    Spongiotic dermatitis    Subclinical hyperthyroidism    Thyroid  disease     Surgical History: Past Surgical History:  Procedure Laterality Date   ABDOMINAL HYSTERECTOMY  2009   breast cyst removal  1995   BREAST LUMPECTOMY Left    BREAST LUMPECTOMY WITH  RADIOACTIVE SEED AND SENTINEL LYMPH NODE BIOPSY Left 06/14/2017   Procedure: LEFT BREAST SEED LOCALIZED LUMPECTOMY (2 SEEDS) AND SENTINEL LYMPH NODE BIOPSY;  Surgeon: Vanderbilt Ned, MD;  Location: [facility];  Service: General;  Laterality: Left;   BROW LIFT Bilateral 01/27/2024   Procedure: BLEPHAROPLASTY;  Surgeon: [name] Greig HERO, MD;  Location: [facility];  Service: Ophthalmology;  Laterality: Bilateral;   COLONOSCOPY  2011   COLONOSCOPY WITH PROPOFOL  N/A 12/12/2017   Procedure: COLONOSCOPY WITH PROPOFOL ;  Surgeon: Therisa Bi, MD;  Location: [facility];  Service: Gastroenterology;  Laterality: N/A;   ESOPHAGOGASTRODUODENOSCOPY (EGD) WITH PROPOFOL  N/A 12/12/2017   Procedure: ESOPHAGOGASTRODUODENOSCOPY (EGD) WITH PROPOFOL ;  Surgeon: Therisa Bi, MD;  Location: [facility] END[facility]COPY;  Service: Gastroenterology;  Laterality: N/A;   FLEXIBLE SIGMOIDOSCOPY  02/12/2013   have normal perianal exam, question focal prolapse. 2 small scars in the rectum.   HEMORRHOID BANDING  2012   3 times over three months   LEFT HEART CATH AND CORONARY ANGIOGRAPHY N/A 08/27/2019   Procedure: LEFT HEART CATH AND CORONARY ANGIOGRAPHY;  Surgeon: Cherrie Toribio SAUNDERS, MD;  Location: [facility];  Service: Cardiovascular;  Laterality: N/A;   PTOSIS REPAIR Bilateral 01/27/2024   Procedure: REPAIR, BLEPHAROPTOSIS;  Surgeon: [name] Greig HERO, MD;  Location[facility];  Service: Ophthalmology;  Laterality: Bilateral;   SIGMOIDOSCOPY  2013   TONSILECTOMY/ADENOIDECTOMY WITH MYRINGOTOMY  remote   UPPER GI ENDOSCOPY  2013    Home Medications:  Allergies as of 05/28/2024       Reactions   Propoxyphene Rash   Uncoded Allergy. Allergen: mellaril, Other Reaction: Other reaction, double vision Uncoded Allergy. Allergen: mellaril, Other Reaction: Other reaction, double vision Uncoded Allergy. Allergen: mellaril, Other Reaction: Other reaction, double vision   Thioridazine Hcl    REACTION:  Reaction not known   Bupropion Rash, Other (See Comments)   Insomnia Insomnia   Citalopram Rash, Other (See Comments)   Lightheaded Lightheaded   Duloxetine Rash, Other (See Comments)   Insomnia Insomnia   Thioridazine Rash   REACTION: Reaction not known REACTION: Reaction not known        Medication List        Accurate as of May 25, 2024  8:37 AM. If you have any questions, ask your nurse or doctor.          aspirin  EC 81 MG tablet Take 1 tablet (81 mg total) by mouth daily.   Ativan  0.5 MG tablet Generic drug: LORazepam  Take 0.5 mg by mouth every 4 (four) hours as needed for anxiety.   CALCIUM  600 PO Take 600 mg by mouth daily.   cholecalciferol 1000 units tablet Commonly known as: VITAMIN D  Take 1,000 Units by mouth daily.   estradiol  0.01 % Crea vaginal cream Commonly known as: ESTRACE  Estrogen Cream Instructions Discard applicator Apply pea sized amount to tip of finger to urethra before bed every night for 1 week then use Monday, Wednesday and Friday. Wash hands well after application.   Magnesium 400 MG Caps Take 1 capsule by mouth daily.   melatonin 5 MG Tabs Take 5 mg by mouth at bedtime.   nitrofurantoin  (macrocrystal-monohydrate) 100 MG capsule Commonly known as: MACROBID  Take 1 capsule (100 mg total) by mouth daily.   omeprazole  20 MG capsule Commonly known as: PRILOSEC Take 1 capsule (20 mg total) by mouth daily.   rosuvastatin  20 MG tablet Commonly known as: Crestor  Take 1 tablet (20 mg total) by mouth daily.        Allergies: Allergies[1]  Family History: Family History  Problem Relation Age of Onset   Hypertension Mother    Thyroid  disease Mother        Multi problems   Coronary artery disease Father    Heart failure Father    Atrial fibrillation Father    Hypertension Brother    Diabetes Brother    Heart attack Brother 2   Colon cancer Neg Hx    Esophageal cancer Neg Hx    Rectal cancer Neg Hx    Stomach cancer  Neg Hx     Social History:  reports that she quit smoking about 26 years ago. Her smoking use included cigarettes. She started smoking about 46 years ago. She has a 30 pack-year smoking history. She has been exposed to tobacco smoke. She has never used smokeless tobacco. She reports current alcohol use. She reports that she does not use drugs.  ROS: Pertinent ROS in HPI  Physical Exam: There were no vitals taken for this visit.  Constitutional:  Well nourished. Alert and oriented, No acute distress. HEENT: [place] AT, moist mucus membranes.  Trachea midline, no masses. Cardiovascular: No clubbing, cyanosis, or edema. Respiratory: Normal respiratory effort, no increased work of breathing.  GU: No CVA tenderness.  No bladder fullness or masses.  Recession of labia minora, dry, pale vulvar vaginal mucosa and loss of mucosal ridges and folds.  Normal urethral meatus, no lesions, no prolapse, no discharge.   No urethral masses, tenderness and/or tenderness. No bladder fullness, tenderness or masses. *** vagina mucosa, *** estrogen effect, no discharge, no lesions, *** pelvic support, *** cystocele and *** rectocele noted.  No cervical motion tenderness.  Uterus is freely mobile and non-fixed.  No adnexal/parametria masses or tenderness noted.  Anus and perineum are without rashes or lesions.   ***  Neurologic: Grossly intact, no focal deficits, moving all 4 extremities. Psychiatric: Normal mood and affect.    Laboratory Data: See Epic and HPI   I have reviewed the labs.   Pertinent Imaging: ***  Assessment & Plan:  ***  1. rUTI's - criteria for recurrent UTI has been met with 2 or more infections in 6 months or 3 or greater infections in one year             - vaginal estrogen cream is first-line therapy for postmenopausal women ***  - patient is instructed to increase their water intake until the urine is pale yellow or clear (10 to 12 cups daily) ***  - taking probiotics that include   Lactobacillus crispatus in premenopausal women and oral capsules with Lactobacillus rhamnosus GR-1 and Lactobacillus reuteri RC-14 in postmenopausal women ***  - Hiprex 1 gram twice daily if creatinine clearance is > 30 ***  - could consider D-mannose or cranberry products (tablets or Knudsen cranberry concentrate, 1 ounce mixed with 8 ounces of water), but evidence is weak with contradictory findings ***  - address constipation issues ***  - cetaphil to clean the genital area (not soap) when bathing and after bowel movements   - avoid antibiotics unless systemic infection or before urological instrumentation  - Kegel exercises to strengthen pelvic floor   - Recurrent UTI - well controlled on daily *** prophylaxis. - Excellent response with no infections in the past 12 months. - Medication tolerance - good. -Continue *** prophylaxis as currently prescribed. -Reinforce behavioral and preventive strategies (hydration, timed voiding, post-coital voiding, avoidance of bladder irritants). -Discussed option of trial discontinuation in the future if she remains stable; patient prefers to continue for now. -Monitor for any breakthrough symptoms; obtain urine culture if symptoms recur.  2. Genitourinary Syndrome of Menopause (GSM)  - Explained that GSM is a common condition that affects women during and after menopause. It is characterized by a cluster of symptoms related to the genital and urinary tracts, including: vaginal dryness, pain or discomfort during intercourse (dyspareunia), burning or irritation in the vulva or vagina, thinning or loss of vaginal tissue, frequent urination, urgency (feeling the need to urinate immediately), incontinence (loss of bladder control), and pain or burning during urination - explained that GSM is primarily caused by the decline in estrogen levels during menopause. This leads to changes in the vaginal tissue, making it thinner, drier, and more susceptible to irritation.  Other factors that may contribute to GSM include: Smoking, certain medications (e.g., chemotherapy drugs, and pelvic organ prolapse - advised that First-line treatment options are: Local low-dose vaginal estrogen (cream, ring, tablet), which improves dryness, irritation, dyspareunia and it is safe for most patients, including those with history of breast cancer (with multidisciplinary input). - advised they can also use vaginal moisturizers and lubricants (alone or combined) and avoid irritants and harsh cleansers.   - Pelvic floor  physical therapy for dysfunction is also helpful. *** - Referral to sex therapy or counseling if psychosocial concerns are present. *** - Reassured that there is no evidence linking local estrogen to breast or endometrial cancer - Explained that it may take up to 8 months of consistent application of the vaginal estrogen cream to cause unnecessary changes needed to address GSM and that this will be a lifelong medication - Will start with vaginal estrogen cream, applying a pea-sized amount with a fingertip just inside the vaginal introitus every night for 30 days and then continuing on to Monday, Wednesday and Friday nights - estradiol  (ESTRACE ) 0.01 % CREA vaginal cream, Apply one pea-sized amount around the opening of the urethra daily for 30 days, then 3 times weekly moving forward, sent to pharmacy ***                                                      No follow-ups on file.  These notes generated with voice recognition software. I apologize for typographical errors.  CLOTILDA CORNWALL, PA-C  [facility] [address]  [address] [place], [place] [address] [phone]     [1]  Allergies Allergen Reactions   Propoxyphene Rash    Uncoded Allergy. Allergen: mellaril, Other Reaction: Other reaction, double vision Uncoded Allergy. Allergen: mellaril, Other Reaction: Other reaction, double vision Uncoded Allergy. Allergen:  mellaril, Other Reaction: Other reaction, double vision   Thioridazine Hcl     REACTION: Reaction not known   Bupropion Rash and Other (See Comments)    Insomnia Insomnia   Citalopram Rash and Other (See Comments)    Lightheaded Lightheaded   Duloxetine Rash and Other (See Comments)    Insomnia Insomnia   Thioridazine Rash    REACTION: Reaction not known REACTION: Reaction not known   "

## 2024-05-28 ENCOUNTER — Ambulatory Visit: Admitting: Urology

## 2024-05-28 DIAGNOSIS — N390 Urinary tract infection, site not specified: Secondary | ICD-10-CM

## 2024-05-28 DIAGNOSIS — N958 Other specified menopausal and perimenopausal disorders: Secondary | ICD-10-CM
